# Patient Record
Sex: Female | Born: 1945 | Race: White | Hispanic: No | Smoking: Former smoker
Health system: Southern US, Community
[De-identification: ages and names within clinical notes are randomized; demographics above are authoritative.]

## PROBLEM LIST (undated history)

## (undated) DIAGNOSIS — E785 Hyperlipidemia, unspecified: Secondary | ICD-10-CM

## (undated) DIAGNOSIS — D649 Anemia, unspecified: Secondary | ICD-10-CM

## (undated) DIAGNOSIS — F17201 Nicotine dependence, unspecified, in remission: Secondary | ICD-10-CM

## (undated) DIAGNOSIS — E039 Hypothyroidism, unspecified: Secondary | ICD-10-CM

## (undated) DIAGNOSIS — I639 Cerebral infarction, unspecified: Secondary | ICD-10-CM

## (undated) HISTORY — PX: TOTAL ABDOMINAL HYSTERECTOMY: SHX209

---

## 2014-12-16 ENCOUNTER — Emergency Department (HOSPITAL_BASED_OUTPATIENT_CLINIC_OR_DEPARTMENT_OTHER)
Admission: EM | Admit: 2014-12-16 | Discharge: 2014-12-16 | Disposition: A | Discharge status: Home / Self Care | Payer: Medicare HMO | Attending: Emergency Medicine | Admitting: Emergency Medicine

## 2014-12-16 ENCOUNTER — Encounter (HOSPITAL_BASED_OUTPATIENT_CLINIC_OR_DEPARTMENT_OTHER): Payer: Self-pay | Admitting: *Deleted

## 2014-12-16 ENCOUNTER — Emergency Department (HOSPITAL_BASED_OUTPATIENT_CLINIC_OR_DEPARTMENT_OTHER): Payer: Medicare HMO

## 2014-12-16 DIAGNOSIS — Z880 Allergy status to penicillin: Secondary | ICD-10-CM | POA: Diagnosis not present

## 2014-12-16 DIAGNOSIS — Z87891 Personal history of nicotine dependence: Secondary | ICD-10-CM | POA: Insufficient documentation

## 2014-12-16 DIAGNOSIS — N200 Calculus of kidney: Secondary | ICD-10-CM

## 2014-12-16 DIAGNOSIS — R109 Unspecified abdominal pain: Secondary | ICD-10-CM

## 2014-12-16 DIAGNOSIS — R319 Hematuria, unspecified: Secondary | ICD-10-CM | POA: Diagnosis present

## 2014-12-16 LAB — URINE MICROSCOPIC-ADD ON

## 2014-12-16 LAB — CBC WITH DIFFERENTIAL/PLATELET
Basophils Absolute: 0 10*3/uL (ref 0.0–0.1)
Basophils Relative: 0 % (ref 0–1)
EOS ABS: 0 10*3/uL (ref 0.0–0.7)
Eosinophils Relative: 1 % (ref 0–5)
HEMATOCRIT: 39.7 % (ref 36.0–46.0)
HEMOGLOBIN: 13.1 g/dL (ref 12.0–15.0)
LYMPHS ABS: 0.8 10*3/uL (ref 0.7–4.0)
Lymphocytes Relative: 12 % (ref 12–46)
MCH: 32.2 pg (ref 26.0–34.0)
MCHC: 33 g/dL (ref 30.0–36.0)
MCV: 97.5 fL (ref 78.0–100.0)
MONOS PCT: 6 % (ref 3–12)
Monocytes Absolute: 0.5 10*3/uL (ref 0.1–1.0)
NEUTROS PCT: 81 % — AB (ref 43–77)
Neutro Abs: 5.8 10*3/uL (ref 1.7–7.7)
Platelets: 192 10*3/uL (ref 150–400)
RBC: 4.07 MIL/uL (ref 3.87–5.11)
RDW: 14.2 % (ref 11.5–15.5)
WBC: 7.2 10*3/uL (ref 4.0–10.5)

## 2014-12-16 LAB — URINALYSIS, ROUTINE W REFLEX MICROSCOPIC
BILIRUBIN URINE: NEGATIVE
Glucose, UA: NEGATIVE mg/dL
Ketones, ur: 15 mg/dL — AB
Leukocytes, UA: NEGATIVE
NITRITE: NEGATIVE
PH: 7.5 (ref 5.0–8.0)
Protein, ur: NEGATIVE mg/dL
SPECIFIC GRAVITY, URINE: 1.011 (ref 1.005–1.030)
Urobilinogen, UA: 0.2 mg/dL (ref 0.0–1.0)

## 2014-12-16 LAB — BASIC METABOLIC PANEL
Anion gap: 11 (ref 5–15)
BUN: 17 mg/dL (ref 6–20)
CHLORIDE: 98 mmol/L — AB (ref 101–111)
CO2: 27 mmol/L (ref 22–32)
CREATININE: 1.21 mg/dL — AB (ref 0.44–1.00)
Calcium: 10.8 mg/dL — ABNORMAL HIGH (ref 8.9–10.3)
GFR calc Af Amer: 52 mL/min — ABNORMAL LOW (ref >60)
GFR calc non Af Amer: 45 mL/min — ABNORMAL LOW (ref >60)
GLUCOSE: 108 mg/dL — AB (ref 65–99)
Potassium: 3.7 mmol/L (ref 3.5–5.1)
SODIUM: 136 mmol/L (ref 135–145)

## 2014-12-16 MED ORDER — TRAMADOL HCL 50 MG PO TABS: 50.0000 mg | ORAL_TABLET | Freq: Four times a day (QID) | ORAL | Status: DC | PRN

## 2014-12-16 NOTE — ED Notes (Signed)
Pt moaning and restless at triage, states she has been unable to void "just a thimblefull at a time" this am. Pt assisted to restroom in w/c for cc urine sample, voids approx 100cc dark orange urine, specimen obtained for testing, pt expresses immediate relief. Pt states "Oh my Delrae Rend, the pain is gone! That is the most I have peed since last night and now the pain is completely gone!" pt now rates her pain at 0/10.

## 2014-12-16 NOTE — ED Provider Notes (Signed)
CSN: [id number]     Arrival date & time 12/16/14  1233 History   First MD Initiated Contact with Patient 12/16/14 1307     Chief Complaint  Patient presents with  . Abdominal Pain     (Consider location/radiation/quality/duration/timing/severity/associated sxs/prior Treatment) HPI Comments: 69 y.o. Female presents for left sided flank/groin pain.  The patient reports that the pain came on suddenly around 10 AM this morning.  She said it was severe at time of onset and remained constant and severe until she urinated in the bathroom here where she suddenly felt complete relief of her pain.  The patient denies ever having pain like this before.  She reports that about 4 days ago she noted her urine looked cloudy and started drinking lots of water and taking cranberry to help with this.  She said that her urine cleared until this morning when it looked bloody.  No fevers or chills.  She currently has no pain.  She says that she felt nauseous when the pain was severe but did not throw up.  The pain felt like it radiate from her left side to her left groin.  She said that she could not stop moving secondary to the pain.     History reviewed. No pertinent past medical history. History reviewed. No pertinent past surgical history. History reviewed. No pertinent family history. Social History  Substance Use Topics  . Smoking status: Former Games developer  . Smokeless tobacco: None  . Alcohol Use: None   OB History    No data available     Review of Systems  Constitutional: Negative for fever, chills and fatigue.  HENT: Negative for congestion, postnasal drip and rhinorrhea.   Eyes: Negative for pain and redness.  Respiratory: Negative for cough, chest tightness and shortness of breath.   Cardiovascular: Negative for chest pain and palpitations.  Gastrointestinal: Positive for nausea and abdominal pain. Negative for vomiting, diarrhea and constipation.  Genitourinary: Positive for hematuria. Negative  for dysuria, urgency and frequency.  Musculoskeletal: Negative for myalgias and back pain.  Skin: Negative for rash.  Neurological: Negative for dizziness, weakness, light-headedness and headaches.  Hematological: Does not bruise/bleed easily.      Allergies  Ampicillin; Ciprofloxacin; Erythromycin; and Penicillins  Home Medications   Prior to Admission medications   Not on File   BP 178/101 mmHg  Pulse 74  Temp(Src) 97.5 F (36.4 C) (Oral)  Resp 18  Ht 5' (1.524 m)  Wt 158 lb (71.668 kg)  BMI 30.86 kg/m2  SpO2 98% Physical Exam  Constitutional: She is oriented to person, place, and time. She appears well-developed and well-nourished. No distress.  HENT:  Head: Normocephalic and atraumatic.  Right Ear: External ear normal.  Left Ear: External ear normal.  Mouth/Throat: Oropharynx is clear and moist. No oropharyngeal exudate.  Eyes: EOM are normal. Pupils are equal, round, and reactive to light.  Neck: Normal range of motion. Neck supple.  Cardiovascular: Normal rate, regular rhythm, normal heart sounds and intact distal pulses.   No murmur heard. Pulmonary/Chest: Effort normal. No respiratory distress. She has no wheezes. She has no rales.  Abdominal: Soft. She exhibits no distension. There is no tenderness.  Musculoskeletal: Normal range of motion. She exhibits no edema or tenderness.  Neurological: She is alert and oriented to person, place, and time.  Skin: Skin is warm and dry. No rash noted. She is not diaphoretic.  Vitals reviewed.   ED Course  Procedures (including critical care time) Labs Review Labs  Reviewed  URINALYSIS, ROUTINE W REFLEX MICROSCOPIC (NOT AT [facility])  CBC WITH DIFFERENTIAL/PLATELET  BASIC METABOLIC PANEL    Imaging Review No results found. I have personally reviewed and evaluated these images and lab results as part of my medical decision-making.   EKG Interpretation None      MDM  Patient was seen and evaluated in stable  condition.  Pain resolved before evaluation.  Patient with benign examination and was asymptomatic during evaluation.  UA with hematuria but without infection.  CT with small 2 mm calculus at UVJ.  Cr 1.2.  Patient feeling well.  Patient discharged home in stable condition with a prescription for ultram and instruction to follow up with her PCP. Patient and son in agreement with plan of care and all questions answered prior to discharge.  Final diagnoses:  Left flank pain    1. Renal colic, resolved  2. Ureteral calculus    Leta Baptist, MD 12/16/14 [phone]

## 2014-12-16 NOTE — ED Notes (Signed)
Pt to triage in w/c, reports "cloudy" urine on Saturday, pt states she started pushing fluids over the weekend, today had sudden onset of severe left lq pain radiating to left flank area, pt states "I've never had a kidney stone, but I think I may have one today!"

## 2014-12-16 NOTE — Discharge Instructions (Signed)
Ureteral Colic (Kidney Stones) °Ureteral colic is the result of a condition when kidney stones form inside the kidney. Once kidney stones are formed they may move into the tube that connects the kidney with the bladder (ureter). If this occurs, this condition may cause pain (colic) in the ureter.  °CAUSES  °Pain is caused by stone movement in the ureter and the obstruction caused by the stone. °SYMPTOMS  °The pain comes and goes as the ureter contracts around the stone. The pain is usually intense, sharp, and stabbing in character. The location of the pain may move as the stone moves through the ureter. When the stone is near the kidney the pain is usually located in the back and radiates to the belly (abdomen). When the stone is ready to pass into the bladder the pain is often located in the lower abdomen on the side the stone is located. At this location, the symptoms may mimic those of a urinary tract infection with urinary frequency. Once the stone is located here it often passes into the bladder and the pain disappears completely. °TREATMENT  °· Your caregiver will provide you with medicine for pain relief. °· You may require specialized follow-up X-rays. °· The absence of pain does not always mean that the stone has passed. It may have just stopped moving. If the urine remains completely obstructed, it can cause loss of kidney function or even complete destruction of the involved kidney. It is your responsibility and in your interest that X-rays and follow-ups as suggested by your caregiver are completed. Relief of pain without passage of the stone can be associated with severe damage to the kidney, including loss of kidney function on that side. °· If your stone does not pass on its own, additional measures may be taken by your caregiver to ensure its removal. °HOME CARE INSTRUCTIONS  °· Increase your fluid intake. Water is the preferred fluid since juices containing vitamin C may acidify the urine making it  less likely for certain stones (uric acid stones) to pass. °· Strain all urine. A strainer will be provided. Keep all particulate matter or stones for your caregiver to inspect. °· Take your pain medicine as directed. °· Make a follow-up appointment with your caregiver as directed. °· Remember that the goal is passage of your stone. The absence of pain does not mean the stone is gone. Follow your caregiver's instructions. °· Only take over-the-counter or prescription medicines for pain, discomfort, or fever as directed by your caregiver. °SEEK MEDICAL CARE IF:  °· Pain cannot be controlled with the prescribed medicine. °· You have a fever. °· Pain continues for longer than your caregiver advises it should. °· There is a change in the pain, and you develop chest discomfort or constant abdominal pain. °· You feel faint or pass out. °MAKE SURE YOU:  °· Understand these instructions. °· Will watch your condition. °· Will get help right away if you are not doing well or get worse. °Document Released: 12/29/2004 Document Revised: 07/16/2012 Document Reviewed: 09/15/2010 °[employer]® Patient Information ©2015 [employer]. This information is not intended to replace advice given to you by your health care provider. Make sure you discuss any questions you have with your health care provider. ° °

## 2020-12-05 ENCOUNTER — Inpatient Hospital Stay (HOSPITAL_COMMUNITY): Payer: Medicare HMO

## 2020-12-05 ENCOUNTER — Encounter (HOSPITAL_COMMUNITY): Payer: Self-pay | Admitting: Internal Medicine

## 2020-12-05 ENCOUNTER — Emergency Department (HOSPITAL_COMMUNITY): Payer: Medicare HMO

## 2020-12-05 ENCOUNTER — Other Ambulatory Visit: Payer: Self-pay

## 2020-12-05 ENCOUNTER — Inpatient Hospital Stay (HOSPITAL_COMMUNITY)
Admission: EM | Admit: 2020-12-05 | Discharge: 2020-12-12 | DRG: 643 | Disposition: A | Discharge status: Skilled Nursing Facility | Payer: Medicare HMO | Attending: Internal Medicine | Admitting: Internal Medicine

## 2020-12-05 DIAGNOSIS — N183 Chronic kidney disease, stage 3 unspecified: Secondary | ICD-10-CM | POA: Diagnosis present

## 2020-12-05 DIAGNOSIS — I160 Hypertensive urgency: Secondary | ICD-10-CM | POA: Diagnosis present

## 2020-12-05 DIAGNOSIS — R140 Abdominal distension (gaseous): Secondary | ICD-10-CM

## 2020-12-05 DIAGNOSIS — E079 Disorder of thyroid, unspecified: Secondary | ICD-10-CM

## 2020-12-05 DIAGNOSIS — I6330 Cerebral infarction due to thrombosis of unspecified cerebral artery: Secondary | ICD-10-CM | POA: Insufficient documentation

## 2020-12-05 DIAGNOSIS — R7401 Elevation of levels of liver transaminase levels: Secondary | ICD-10-CM | POA: Diagnosis present

## 2020-12-05 DIAGNOSIS — Z8673 Personal history of transient ischemic attack (TIA), and cerebral infarction without residual deficits: Secondary | ICD-10-CM

## 2020-12-05 DIAGNOSIS — Z880 Allergy status to penicillin: Secondary | ICD-10-CM

## 2020-12-05 DIAGNOSIS — Z20822 Contact with and (suspected) exposure to covid-19: Secondary | ICD-10-CM | POA: Diagnosis present

## 2020-12-05 DIAGNOSIS — Z881 Allergy status to other antibiotic agents status: Secondary | ICD-10-CM | POA: Diagnosis not present

## 2020-12-05 DIAGNOSIS — K567 Ileus, unspecified: Secondary | ICD-10-CM | POA: Diagnosis not present

## 2020-12-05 DIAGNOSIS — Z803 Family history of malignant neoplasm of breast: Secondary | ICD-10-CM

## 2020-12-05 DIAGNOSIS — R29714 NIHSS score 14: Secondary | ICD-10-CM | POA: Diagnosis present

## 2020-12-05 DIAGNOSIS — Z781 Physical restraint status: Secondary | ICD-10-CM

## 2020-12-05 DIAGNOSIS — D649 Anemia, unspecified: Secondary | ICD-10-CM | POA: Diagnosis not present

## 2020-12-05 DIAGNOSIS — Z90710 Acquired absence of both cervix and uterus: Secondary | ICD-10-CM

## 2020-12-05 DIAGNOSIS — G9340 Encephalopathy, unspecified: Secondary | ICD-10-CM

## 2020-12-05 DIAGNOSIS — Z823 Family history of stroke: Secondary | ICD-10-CM

## 2020-12-05 DIAGNOSIS — I361 Nonrheumatic tricuspid (valve) insufficiency: Secondary | ICD-10-CM | POA: Diagnosis not present

## 2020-12-05 DIAGNOSIS — D631 Anemia in chronic kidney disease: Secondary | ICD-10-CM | POA: Diagnosis present

## 2020-12-05 DIAGNOSIS — N179 Acute kidney failure, unspecified: Secondary | ICD-10-CM | POA: Diagnosis present

## 2020-12-05 DIAGNOSIS — G9341 Metabolic encephalopathy: Secondary | ICD-10-CM | POA: Diagnosis present

## 2020-12-05 DIAGNOSIS — E785 Hyperlipidemia, unspecified: Secondary | ICD-10-CM | POA: Diagnosis present

## 2020-12-05 DIAGNOSIS — E876 Hypokalemia: Secondary | ICD-10-CM | POA: Diagnosis not present

## 2020-12-05 DIAGNOSIS — R471 Dysarthria and anarthria: Secondary | ICD-10-CM | POA: Diagnosis present

## 2020-12-05 DIAGNOSIS — E039 Hypothyroidism, unspecified: Principal | ICD-10-CM | POA: Diagnosis present

## 2020-12-05 DIAGNOSIS — M170 Bilateral primary osteoarthritis of knee: Secondary | ICD-10-CM | POA: Diagnosis present

## 2020-12-05 DIAGNOSIS — E538 Deficiency of other specified B group vitamins: Secondary | ICD-10-CM | POA: Diagnosis present

## 2020-12-05 DIAGNOSIS — Z820 Family history of epilepsy and other diseases of the nervous system: Secondary | ICD-10-CM | POA: Diagnosis not present

## 2020-12-05 DIAGNOSIS — Z87891 Personal history of nicotine dependence: Secondary | ICD-10-CM | POA: Diagnosis not present

## 2020-12-05 DIAGNOSIS — E871 Hypo-osmolality and hyponatremia: Secondary | ICD-10-CM | POA: Diagnosis present

## 2020-12-05 DIAGNOSIS — I129 Hypertensive chronic kidney disease with stage 1 through stage 4 chronic kidney disease, or unspecified chronic kidney disease: Secondary | ICD-10-CM | POA: Diagnosis present

## 2020-12-05 DIAGNOSIS — I6389 Other cerebral infarction: Secondary | ICD-10-CM | POA: Diagnosis not present

## 2020-12-05 DIAGNOSIS — K5730 Diverticulosis of large intestine without perforation or abscess without bleeding: Secondary | ICD-10-CM | POA: Diagnosis present

## 2020-12-05 DIAGNOSIS — N1831 Chronic kidney disease, stage 3a: Secondary | ICD-10-CM

## 2020-12-05 DIAGNOSIS — I5189 Other ill-defined heart diseases: Secondary | ICD-10-CM | POA: Diagnosis not present

## 2020-12-05 DIAGNOSIS — I6340 Cerebral infarction due to embolism of unspecified cerebral artery: Secondary | ICD-10-CM | POA: Diagnosis present

## 2020-12-05 HISTORY — DX: Nicotine dependence, unspecified, in remission: F17.201

## 2020-12-05 HISTORY — DX: Anemia, unspecified: D64.9

## 2020-12-05 LAB — URINALYSIS, ROUTINE W REFLEX MICROSCOPIC
Bilirubin Urine: NEGATIVE
Glucose, UA: NEGATIVE mg/dL
Ketones, ur: NEGATIVE mg/dL
Leukocytes,Ua: NEGATIVE
Nitrite: NEGATIVE
Protein, ur: NEGATIVE mg/dL
Specific Gravity, Urine: 1.011 (ref 1.005–1.030)
pH: 7 (ref 5.0–8.0)

## 2020-12-05 LAB — DIFFERENTIAL
Abs Immature Granulocytes: 0.05 10*3/uL (ref 0.00–0.07)
Basophils Absolute: 0.1 10*3/uL (ref 0.0–0.1)
Basophils Relative: 1 %
Eosinophils Absolute: 0 10*3/uL (ref 0.0–0.5)
Eosinophils Relative: 0 %
Immature Granulocytes: 1 %
Lymphocytes Relative: 26 %
Lymphs Abs: 1.5 10*3/uL (ref 0.7–4.0)
Monocytes Absolute: 0.3 10*3/uL (ref 0.1–1.0)
Monocytes Relative: 6 %
Neutro Abs: 3.9 10*3/uL (ref 1.7–7.7)
Neutrophils Relative %: 66 %

## 2020-12-05 LAB — CBC
HCT: 32.6 % — ABNORMAL LOW (ref 36.0–46.0)
Hemoglobin: 10.6 g/dL — ABNORMAL LOW (ref 12.0–15.0)
MCH: 31.6 pg (ref 26.0–34.0)
MCHC: 32.5 g/dL (ref 30.0–36.0)
MCV: 97.3 fL (ref 80.0–100.0)
Platelets: 201 10*3/uL (ref 150–400)
RBC: 3.35 MIL/uL — ABNORMAL LOW (ref 3.87–5.11)
RDW: 13.8 % (ref 11.5–15.5)
WBC: 5.8 10*3/uL (ref 4.0–10.5)
nRBC: 0 % (ref 0.0–0.2)

## 2020-12-05 LAB — COMPREHENSIVE METABOLIC PANEL
ALT: 22 U/L (ref 0–44)
AST: 68 U/L — ABNORMAL HIGH (ref 15–41)
Albumin: 4 g/dL (ref 3.5–5.0)
Alkaline Phosphatase: 66 U/L (ref 38–126)
Anion gap: 12 (ref 5–15)
BUN: 14 mg/dL (ref 8–23)
CO2: 23 mmol/L (ref 22–32)
Calcium: 9.5 mg/dL (ref 8.9–10.3)
Chloride: 96 mmol/L — ABNORMAL LOW (ref 98–111)
Creatinine, Ser: 1.22 mg/dL — ABNORMAL HIGH (ref 0.44–1.00)
GFR, Estimated: 47 mL/min — ABNORMAL LOW (ref >60)
Glucose, Bld: 113 mg/dL — ABNORMAL HIGH (ref 70–99)
Potassium: 3.9 mmol/L (ref 3.5–5.1)
Sodium: 131 mmol/L — ABNORMAL LOW (ref 135–145)
Total Bilirubin: 0.4 mg/dL (ref 0.3–1.2)
Total Protein: 7.2 g/dL (ref 6.5–8.1)

## 2020-12-05 LAB — RAPID URINE DRUG SCREEN, HOSP PERFORMED
Amphetamines: NOT DETECTED
Barbiturates: NOT DETECTED
Benzodiazepines: NOT DETECTED
Cocaine: NOT DETECTED
Opiates: NOT DETECTED
Tetrahydrocannabinol: NOT DETECTED

## 2020-12-05 LAB — I-STAT CHEM 8, ED
BUN: 15 mg/dL (ref 8–23)
Calcium, Ion: 1.17 mmol/L (ref 1.15–1.40)
Chloride: 99 mmol/L (ref 98–111)
Creatinine, Ser: 1 mg/dL (ref 0.44–1.00)
Glucose, Bld: 109 mg/dL — ABNORMAL HIGH (ref 70–99)
HCT: 33 % — ABNORMAL LOW (ref 36.0–46.0)
Hemoglobin: 11.2 g/dL — ABNORMAL LOW (ref 12.0–15.0)
Potassium: 3.9 mmol/L (ref 3.5–5.1)
Sodium: 132 mmol/L — ABNORMAL LOW (ref 135–145)
TCO2: 24 mmol/L (ref 22–32)

## 2020-12-05 LAB — PROTIME-INR
INR: 1 (ref 0.8–1.2)
Prothrombin Time: 13.4 seconds (ref 11.4–15.2)

## 2020-12-05 LAB — CSF CELL COUNT WITH DIFFERENTIAL
RBC Count, CSF: 0 /mm3
Tube #: 1
WBC, CSF: 2 /mm3 (ref 0–5)

## 2020-12-05 LAB — LACTIC ACID, PLASMA: Lactic Acid, Venous: 1.3 mmol/L (ref 0.5–1.9)

## 2020-12-05 LAB — CBG MONITORING, ED: Glucose-Capillary: 110 mg/dL — ABNORMAL HIGH (ref 70–99)

## 2020-12-05 LAB — TSH: TSH: 41.462 u[IU]/mL — ABNORMAL HIGH (ref 0.350–4.500)

## 2020-12-05 LAB — APTT: aPTT: 30 seconds (ref 24–36)

## 2020-12-05 LAB — ETHANOL: Alcohol, Ethyl (B): <10 mg/dL (ref <10)

## 2020-12-05 LAB — AMMONIA: Ammonia: 13 umol/L (ref 9–35)

## 2020-12-05 LAB — C-REACTIVE PROTEIN: CRP: <0.5 mg/dL (ref <1.0)

## 2020-12-05 LAB — GAMMA GT: GGT: 10 U/L (ref 7–50)

## 2020-12-05 LAB — SALICYLATE LEVEL: Salicylate Lvl: <7 mg/dL — ABNORMAL LOW (ref 7.0–30.0)

## 2020-12-05 LAB — VITAMIN B12: Vitamin B-12: 206 pg/mL (ref 180–914)

## 2020-12-05 LAB — SEDIMENTATION RATE: Sed Rate: 38 mm/hr — ABNORMAL HIGH (ref 0–22)

## 2020-12-05 LAB — ACETAMINOPHEN LEVEL: Acetaminophen (Tylenol), Serum: <10 ug/mL — ABNORMAL LOW (ref 10–30)

## 2020-12-05 MED ORDER — SODIUM CHLORIDE 0.9% FLUSH
3.0000 mL | Freq: Once | INTRAVENOUS | Status: AC
  Administered 2020-12-05: 3 mL via INTRAVENOUS

## 2020-12-05 MED ORDER — KETAMINE HCL 50 MG/5ML IJ SOSY
60.0000 mg | PREFILLED_SYRINGE | Freq: Once | INTRAMUSCULAR | Status: AC
  Administered 2020-12-05: 50 mg via INTRAVENOUS
  Filled 2020-12-05: qty 10

## 2020-12-05 MED ORDER — THIAMINE HCL 100 MG/ML IJ SOLN
250.0000 mg | Freq: Every day | INTRAVENOUS | Status: AC
  Administered 2020-12-07 – 2020-12-12 (×6): 250 mg via INTRAVENOUS
  Filled 2020-12-05 (×6): qty 2.5

## 2020-12-05 MED ORDER — SODIUM CHLORIDE 0.9 % IV SOLN: INTRAVENOUS | Status: DC

## 2020-12-05 MED ORDER — LORAZEPAM 2 MG/ML IJ SOLN
INTRAMUSCULAR | Status: AC
  Administered 2020-12-05: 1 mg via INTRAVENOUS
  Filled 2020-12-05: qty 1

## 2020-12-05 MED ORDER — ONDANSETRON HCL 4 MG PO TABS: 4.0000 mg | ORAL_TABLET | Freq: Four times a day (QID) | ORAL | Status: DC | PRN

## 2020-12-05 MED ORDER — LIDOCAINE HCL (PF) 1 % IJ SOLN
INTRAMUSCULAR | Status: AC
  Filled 2020-12-05: qty 5

## 2020-12-05 MED ORDER — SODIUM CHLORIDE 0.9% FLUSH
3.0000 mL | Freq: Two times a day (BID) | INTRAVENOUS | Status: DC
  Administered 2020-12-05 – 2020-12-12 (×14): 3 mL via INTRAVENOUS

## 2020-12-05 MED ORDER — KETAMINE HCL 10 MG/ML IJ SOLN
INTRAMUSCULAR | Status: AC | PRN
  Administered 2020-12-05: 30 mg via INTRAVENOUS
  Administered 2020-12-05: 20 mg via INTRAVENOUS

## 2020-12-05 MED ORDER — HYDRALAZINE HCL 20 MG/ML IJ SOLN: 10.0000 mg | INTRAMUSCULAR | Status: DC | PRN

## 2020-12-05 MED ORDER — THIAMINE HCL 100 MG/ML IJ SOLN: 100.0000 mg | Freq: Every day | INTRAMUSCULAR | Status: DC

## 2020-12-05 MED ORDER — LORAZEPAM 2 MG/ML IJ SOLN: 1.0000 mg | Freq: Once | INTRAMUSCULAR | Status: AC

## 2020-12-05 MED ORDER — HALOPERIDOL LACTATE 5 MG/ML IJ SOLN
10.0000 mg | Freq: Once | INTRAMUSCULAR | Status: AC
  Administered 2020-12-05: 10 mg via INTRAMUSCULAR
  Filled 2020-12-05: qty 2

## 2020-12-05 MED ORDER — ONDANSETRON HCL 4 MG/2ML IJ SOLN: 4.0000 mg | Freq: Four times a day (QID) | INTRAMUSCULAR | Status: DC | PRN

## 2020-12-05 MED ORDER — ENOXAPARIN SODIUM 40 MG/0.4ML IJ SOSY
40.0000 mg | PREFILLED_SYRINGE | INTRAMUSCULAR | Status: DC
  Administered 2020-12-05 – 2020-12-06 (×2): 40 mg via SUBCUTANEOUS
  Filled 2020-12-05 (×2): qty 0.4

## 2020-12-05 MED ORDER — THIAMINE HCL 100 MG/ML IJ SOLN
500.0000 mg | Freq: Three times a day (TID) | INTRAVENOUS | Status: AC
  Administered 2020-12-05 – 2020-12-07 (×6): 500 mg via INTRAVENOUS
  Filled 2020-12-05 (×7): qty 5

## 2020-12-05 MED ORDER — DEXTROSE 5 % IV SOLN
10.0000 mg/kg | Freq: Two times a day (BID) | INTRAVENOUS | Status: DC
  Administered 2020-12-05 – 2020-12-06 (×2): 640 mg via INTRAVENOUS
  Filled 2020-12-05 (×5): qty 12.8

## 2020-12-05 NOTE — ED Triage Notes (Signed)
Pt her via EMS with stroke symptoms. Pt drove to [facility], went inside and returned to car. Staff found pt in car, altered, with slurred speech, lethagic. LKW 12/05/20 @1000 . Pt independent and lives alone.

## 2020-12-05 NOTE — ED Notes (Signed)
Patient is still agitated s/p Haldol administration, trying to grab lines.

## 2020-12-05 NOTE — ED Notes (Signed)
Patient's daughter at the bedside.

## 2020-12-05 NOTE — ED Provider Notes (Signed)
[facility] EMERGENCY DEPARTMENT Provider Note   CSN: [id number] Arrival date & time: 12/05/20  1245     History No chief complaint on file.   Jane Navarro is a 75 y.o. female.  Patient presented as a stroke alert.  She reportedly was able to go to a supermarket and do her shopping however when she was found outside in the car she appeared confused and altered and onlookers called police.  Upon their arrival she was very confused there were concerned about a stroke and brought to the ER.  Stroke alert was activated on route.  Unable to gain additional review of systems was history from the patient herself.  Family arrived and states that she really does not see doctors ever and there is no known medical conditions.      Past Medical History:  Diagnosis Date   Anemia    Tobacco abuse, in remission     Patient Active Problem List   Diagnosis Date Noted   Acute metabolic encephalopathy 12/05/2020   Hypertensive urgency 12/05/2020   CKD (chronic kidney disease), stage III (HCC) 12/05/2020   Normocytic anemia 12/05/2020   Transaminitis 12/05/2020     Past Surgical History:  Procedure Laterality Date   TOTAL ABDOMINAL HYSTERECTOMY       OB History   No obstetric history on file.     Family History  Problem Relation Age of Onset   Breast cancer Mother    Dementia Mother    Transient ischemic attack Mother    Parkinson's disease Father     Social History   Tobacco Use   Smoking status: Former   Tobacco comments:    Quit smoking in her late 28s  Substance Use Topics   Alcohol use: Not Currently   Drug use: Never    Home Medications Prior to Admission medications   Not on File    Allergies    Ampicillin, Ciprofloxacin, Erythromycin, and Penicillins  Review of Systems   Review of Systems  Unable to perform ROS: Mental status change   Physical Exam Updated Vital Signs BP (!) 171/92 (BP Location: Right Arm)   Pulse 74   Temp 98.2 F  (36.8 C)   Resp 17   Ht 5' (1.524 m)   Wt 64.2 kg   SpO2 99%   BMI 27.64 kg/m   Physical Exam Constitutional:      General: She is not in acute distress.    Appearance: Normal appearance.     Comments: Awake, confused  HENT:     Head: Normocephalic.     Nose: Nose normal.  Eyes:     Extraocular Movements: Extraocular movements intact.  Cardiovascular:     Rate and Rhythm: Normal rate.  Pulmonary:     Effort: Pulmonary effort is normal.  Musculoskeletal:        General: Normal range of motion.     Cervical back: Normal range of motion.  Neurological:     Comments: Patient appears moving all extremities no focal weakness noted.  She appears confused, following commands intermittently.  She will answer questions but with bizarre answers.  No facial droop or slurred speech noted.    ED Results / Procedures / Treatments   Labs (all labs ordered are listed, but only abnormal results are displayed) Labs Reviewed  CBC - Abnormal; Notable for the following components:      Result Value   RBC 3.35 (*)    Hemoglobin 10.6 (*)  HCT 32.6 (*)    All other components within normal limits  COMPREHENSIVE METABOLIC PANEL - Abnormal; Notable for the following components:   Sodium 131 (*)    Chloride 96 (*)    Glucose, Bld 113 (*)    Creatinine, Ser 1.22 (*)    AST 68 (*)    GFR, Estimated 47 (*)    All other components within normal limits  PROTEIN AND GLUCOSE, CSF - Abnormal; Notable for the following components:   Total  Protein, CSF 84 (*)    All other components within normal limits  URINALYSIS, ROUTINE W REFLEX MICROSCOPIC - Abnormal; Notable for the following components:   APPearance HAZY (*)    Hgb urine dipstick SMALL (*)    Bacteria, UA RARE (*)    All other components within normal limits  TSH - Abnormal; Notable for the following components:   TSH 41.462 (*)    All other components within normal limits  SEDIMENTATION RATE - Abnormal; Notable for the following  components:   Sed Rate 38 (*)    All other components within normal limits  SALICYLATE LEVEL - Abnormal; Notable for the following components:   Salicylate Lvl <7.0 (*)    All other components within normal limits  ACETAMINOPHEN LEVEL - Abnormal; Notable for the following components:   Acetaminophen (Tylenol), Serum <10 (*)    All other components within normal limits  COMPREHENSIVE METABOLIC PANEL - Abnormal; Notable for the following components:   Sodium 127 (*)    Chloride 96 (*)    Glucose, Bld 111 (*)    AST 90 (*)    All other components within normal limits  LIPID PANEL - Abnormal; Notable for the following components:   Cholesterol 429 (*)    Triglycerides 258 (*)    VLDL 52 (*)    LDL Cholesterol 327 (*)    All other components within normal limits  CBC WITH DIFFERENTIAL/PLATELET - Abnormal; Notable for the following components:   RBC 3.66 (*)    Hemoglobin 11.8 (*)    HCT 34.1 (*)    Lymphs Abs 0.5 (*)    All other components within normal limits  COMPREHENSIVE METABOLIC PANEL - Abnormal; Notable for the following components:   Sodium 128 (*)    Chloride 96 (*)    Creatinine, Ser 1.15 (*)    AST 109 (*)    GFR, Estimated 50 (*)    All other components within normal limits  CBC - Abnormal; Notable for the following components:   RBC 3.69 (*)    Hemoglobin 11.8 (*)    HCT 34.4 (*)    All other components within normal limits  I-STAT CHEM 8, ED - Abnormal; Notable for the following components:   Sodium 132 (*)    Glucose, Bld 109 (*)    Hemoglobin 11.2 (*)    HCT 33.0 (*)    All other components within normal limits  CBG MONITORING, ED - Abnormal; Notable for the following components:   Glucose-Capillary 110 (*)    All other components within normal limits  CSF CULTURE W GRAM STAIN  SARS CORONAVIRUS 2 (TAT 6-24 HRS)  CULTURE, BLOOD (ROUTINE X 2)  CULTURE, BLOOD (ROUTINE X 2)  PROTIME-INR  APTT  DIFFERENTIAL  CSF CELL COUNT WITH DIFFERENTIAL  CSF CELL  COUNT WITH DIFFERENTIAL  ETHANOL  RAPID URINE DRUG SCREEN, HOSP PERFORMED  LACTIC ACID, PLASMA  VITAMIN B12  AMMONIA  RPR  C-REACTIVE PROTEIN  GAMMA GT  MAGNESIUM  PHOSPHORUS  IRON AND TIBC  FOLATE  HEPARIN LEVEL (UNFRACTIONATED)  HSV 1/2 PCR, CSF  OLIGOCLONAL BANDS, CSF + SERM  DRAW EXTRA CLOT TUBE  VITAMIN B1  HEMOGLOBIN A1C  RAPID URINE DRUG SCREEN, HOSP PERFORMED  HEPARIN LEVEL (UNFRACTIONATED)    EKG EKG Interpretation  Date/Time:  Saturday December 05 2020 13:32:58 EDT Ventricular Rate:  84 PR Interval:  197 QRS Duration: 94 QT Interval:  407 QTC Calculation: 482 R Axis:   -41 Text Interpretation: Sinus rhythm Probable LVH with secondary repol abnrm Inferior infarct, old Anterior Q waves, possibly due to LVH Baseline wander in lead(s) I II aVR Confirmed by Meridee Score [phone]) on 12/06/2020 12:03:50 PM  Radiology ECHOCARDIOGRAM COMPLETE  Result Date: 12/06/2020    ECHOCARDIOGRAM REPORT   Patient Name:   YURITZA PAULHUS Date of Exam: 12/06/2020 Medical Rec #:  [id number]    Height:       60.0 in Accession #:    [id number]   Weight:       141.5 lb Date of Birth:  [birth year]    BSA:          1.612 m Patient Age:    74 years     BP:           150/92 mmHg Patient Gender: F            HR:           72 bpm. Exam Location:  Inpatient Procedure: 2D Echo, Cardiac Doppler and Color Doppler Indications:    Stroke I63.9  History:        Patient has no prior history of Echocardiogram examinations.                 Signs/Symptoms:Altered Mental Status; Risk Factors:Former                 Smoker. Hypertensive urgency. Hypothyroidism. Passed out in                 parking lot after shopping.  Sonographer:    Roosvelt Maser RDCS Referring Phys: [id number] [address] [facility] IMPRESSIONS  1. 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 It measures approximately 13 mm in maximum  length and 5 mm in width, with a relatively narrow attachment base. Left ventricular ejection fraction, by estimation, is 60 to 65%. The left ventricle has normal function. The left ventricle has no regional wall motion abnormalities. There is mild concentric left ventricular hypertrophy. Left ventricular diastolic parameters are consistent with Grade I diastolic dysfunction (impaired relaxation). Elevated left atrial pressure.  2. Right ventricular systolic function is normal. The right ventricular size is normal.  3. The mitral valve is normal in structure. No evidence of mitral valve regurgitation. No evidence of mitral stenosis.  4. The aortic valve is normal in structure. Aortic valve regurgitation is not visualized. No aortic stenosis is present.  5. The inferior vena cava is normal in size with greater than 50% respiratory variability, suggesting right atrial pressure of 3 mmHg. Conclusion(s)/Recommendation(s): Discussed with primary team. The left ventricular mass is highly atypical for either thrombus or vegetation and the location is unusual for myxoma or papillary fibroelastoma. Its mobility suggests significant embolic potential. While TEE would offer better resolution, it may not add diagnostic value. Consider cardiac MRI. FINDINGS  Left Ventricle: There is a highly mobile atypical hyperechogenic mass in the left ventricular  cavity, attached to the lateral wall endocardium, just apical to the papillary muscle base. It measures approximately 13 mm in maximum length and 5 mm in width, with a relatively narrow attachment base. Left ventricular ejection fraction, by estimation, is 60 to 65%. The left ventricle has normal function. The left ventricle has no regional wall motion abnormalities. The left ventricular internal cavity size was normal in size. There is mild concentric left ventricular hypertrophy. Left ventricular diastolic parameters are consistent with Grade I diastolic dysfunction (impaired  relaxation). Elevated left atrial pressure. Right Ventricle: The right ventricular size is normal. No increase in right ventricular wall thickness. Right ventricular systolic function is normal. Left Atrium: Left atrial size was normal in size. Right Atrium: Right atrial size was normal in size. Pericardium: There is no evidence of pericardial effusion. Mitral Valve: The mitral valve is normal in structure. No evidence of mitral valve regurgitation. No evidence of mitral valve stenosis. Tricuspid Valve: The tricuspid valve is normal in structure. Tricuspid valve regurgitation is not demonstrated. No evidence of tricuspid stenosis. Aortic Valve: The aortic valve is normal in structure. Aortic valve regurgitation is not visualized. No aortic stenosis is present. Aortic valve mean gradient measures 5.0 mmHg. Aortic valve peak gradient measures 8.1 mmHg. Aortic valve area, by VTI measures 1.71 cm. Pulmonic Valve: The pulmonic valve was normal in structure. Pulmonic valve regurgitation is not visualized. No evidence of pulmonic stenosis. Aorta: The aortic root is normal in size and structure. Venous: The inferior vena cava is normal in size with greater than 50% respiratory variability, suggesting right atrial pressure of 3 mmHg. IAS/Shunts: No atrial level shunt detected by color flow Doppler.  LEFT VENTRICLE PLAX 2D LVIDd:         3.60 cm LVIDs:         2.60 cm  Diastology LV PW:         1.20 cm  LV e' medial:    3.97 cm/s LV IVS:        1.50 cm  LV E/e' medial:  16.8 LVOT diam:     1.70 cm  LV e' lateral:   5.35 cm/s LV SV:         49       LV E/e' lateral: 12.5 LV SV Index:   30 LVOT Area:     2.27 cm  RIGHT VENTRICLE RV Basal diam:  3.00 cm LEFT ATRIUM             Index       RIGHT ATRIUM           Index LA diam:        2.10 cm 1.30 cm/m  RA Area:     10.60 cm LA Vol (A2C):   36.7 ml 22.77 ml/m RA Volume:   21.10 ml  13.09 ml/m LA Vol (A4C):   21.6 ml 13.40 ml/m LA Biplane Vol: 30.1 ml 18.68 ml/m  AORTIC  VALVE AV Area (Vmax):    1.65 cm AV Area (Vmean):   1.48 cm AV Area (VTI):     1.71 cm AV Vmax:           142.00 cm/s AV Vmean:          105.000 cm/s AV VTI:            0.285 m AV Peak Grad:      8.1 mmHg AV Mean Grad:      5.0 mmHg LVOT Vmax:  103.00 cm/s LVOT Vmean:        68.300 cm/s LVOT VTI:          0.215 m LVOT/AV VTI ratio: 0.75  AORTA Ao Root diam: 2.40 cm MITRAL VALVE MV Area (PHT): 2.29 cm     SHUNTS MV Decel Time: 332 msec     Systemic VTI:  0.22 m MV E velocity: 66.80 cm/s   Systemic Diam: 1.70 cm MV A velocity: 109.00 cm/s MV E/A ratio:  0.61 Mihai Croitoru MD Electronically signed by Thurmon Fair MD Signature Date/Time: 12/06/2020/2:42:42 PM    Final    US THYROID  Result Date: 12/06/2020 CLINICAL DATA:  Goiter.  Elevated TSH EXAM: THYROID ULTRASOUND TECHNIQUE: Ultrasound examination of the thyroid gland and adjacent soft tissues was performed. COMPARISON:  None. FINDINGS: Parenchymal Echotexture: None identified Isthmus: Not visualized Right lobe: Not visualized Left lobe: Not visualized _________________________________________________________ Estimated total number of nodules >/= 1 cm: 0 Number of spongiform nodules >/=  2 cm not described below (TR1): 0 Number of mixed cystic and solid nodules >/= 1.5 cm not described below (TR2): 0 _________________________________________________________ No normal thyroid parenchyma was localized. No regional cervical adenopathy. IMPRESSION: Nonvisualization of thyroid. In the absence of previous thyroidectomy, this would suggest marked parenchymal atrophy. The above is in keeping with the ACR TI-RADS recommendations - J Am Coll Radiol 2017;14:587-595. Electronically Signed   By: Corlis Leak M.D.   On: 12/06/2020 15:53   CT HEAD CODE STROKE WO CONTRAST  Result Date: 12/05/2020 CLINICAL DATA:  Code stroke. Neuro deficit, acute, stroke suspected. EXAM: CT HEAD WITHOUT CONTRAST TECHNIQUE: Contiguous axial images were obtained from the base of the  skull through the vertex without intravenous contrast. COMPARISON:  Report from head CT 11/21/1998 (images currently unavailable). FINDINGS: Brain: Cerebral volume is normal for age. 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 Background mild multifocal T2/FLAIR hyperintensity within the cerebral white matter, nonspecific but compatible with chronic small vessel ischemic disease. There is no acute intracranial hemorrhage. No demarcated cortical infarct. No extra-axial fluid collection. No evidence of an intracranial mass. No midline shift. Vascular: No hyperdense vessel.  Atherosclerotic calcifications. Skull: Normal. Negative for fracture or focal lesion. Sinuses/Orbits: Visualized orbits show no acute finding. No significant paranasal sinus disease. ASPECTS [facility] Stroke Program Early CT Score) - Ganglionic level infarction (caudate, lentiform nuclei, internal capsule, insula, M1-M3 cortex): 7 - Supraganglionic infarction (M4-M6 cortex): 3 Total score (0-10 with 10 being normal): 10 (when discounting chronic infarcts). These results were communicated to Dr. Selina Cooley At 1:18 pmon 9/3/2022by text page via the [facility] messaging system. IMPRESSION: No evidence of acute intracranial abnormality. 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 Background mild chronic small vessel ischemic changes within the cerebral white matter. Electronically Signed   By: Jackey Loge D.O.   On: 12/05/2020 13:18    Procedures .Lumbar Puncture  Date/Time: 12/05/2020 3:31 PM Performed by: Cheryll Cockayne, MD Authorized by: Cheryll Cockayne, MD   Consent:    Consent obtained:  Emergent situation and verbal   Consent given by:  Healthcare agent Procedure details:    Lumbar space:  L3-L4 interspace   Needle gauge:  20 .Critical Care  Date/Time: 12/05/2020 3:31 PM Performed by: Cheryll Cockayne, MD Authorized by: Cheryll Cockayne, MD   Critical care provider statement:     Critical care time (minutes):  40   Critical care time was exclusive of:  Separately billable procedures and treating  other patients and teaching time   Critical care was necessary to treat or prevent imminent or life-threatening deterioration of the following conditions:  CNS failure or compromise   Medications Ordered in ED Medications  thiamine 500mg  in normal saline (35ml) IVPB (500 mg Intravenous New Bag/Given 12/07/20 0526)    Followed by  thiamine (B-1) 250 mg in sodium chloride 0.9 % 50 mL IVPB (has no administration in time range)    Followed by  thiamine (B-1) injection 100 mg (has no administration in time range)  sodium chloride flush (NS) 0.9 % injection 3 mL (3 mLs Intravenous Given 12/06/20 2110)  ondansetron (ZOFRAN) tablet 4 mg (has no administration in time range)    Or  ondansetron (ZOFRAN) injection 4 mg (has no administration in time range)  hydrALAZINE (APRESOLINE) injection 10 mg (has no administration in time range)  levothyroxine (SYNTHROID, LEVOTHROID) injection 50 mcg (50 mcg Intravenous Given 12/06/20 0916)  cyanocobalamin ((VITAMIN B-12)) injection 1,000 mcg (1,000 mcg Intramuscular Given 12/06/20 0916)  acyclovir (ZOVIRAX) 640 mg in dextrose 5 % 100 mL IVPB (640 mg Intravenous New Bag/Given 12/07/20 0409)  atorvastatin (LIPITOR) tablet 80 mg (80 mg Oral Given 12/06/20 1246)  heparin ADULT infusion 100 units/mL (25000 units/263mL) (650 Units/hr Intravenous Rate/Dose Change 12/07/20 0409)  LORazepam (ATIVAN) injection 0.5 mg (0.5 mg Intravenous Given 12/06/20 2117)  haloperidol lactate (HALDOL) injection 5 mg (5 mg Intravenous Given 12/06/20 1906)  sodium chloride flush (NS) 0.9 % injection 3 mL (3 mLs Intravenous Given 12/05/20 1330)  LORazepam (ATIVAN) injection 1 mg (1 mg Intravenous Given by Other 12/05/20 1328)  LORazepam (ATIVAN) injection 1 mg (1 mg Intravenous Given by Other 12/05/20 1329)  haloperidol lactate (HALDOL) injection 10 mg (10 mg Intramuscular Given 12/05/20 1442)   ketamine 50 mg in normal saline 5 mL (10 mg/mL) syringe (50 mg Intravenous Given 12/05/20 1543)  ketamine (KETALAR) injection (20 mg Intravenous Given 12/05/20 1543)  lidocaine (PF) (XYLOCAINE) 1 % injection (  Given by Other 12/05/20 1614)    ED Course  I have reviewed the triage vital signs and the nursing notes.  Pertinent labs & imaging results that were available during my care of the patient were reviewed by me and considered in my medical decision making (see chart for details).    MDM Rules/Calculators/A&P                           Patient was a stroke alert activation.  However no additional imaging was pursued as last known well is unknown and exam was nonfocal.  Labs and CT imaging unremarkable.  Patient seen by neurology recommending lumbar puncture.  Patient given additional Haldol without significant sedation achieved.  Ketamine sedation pursued for lumbar puncture attempt.  Will be admitted to the hospitalist team.  Final Clinical Impression(s) / ED Diagnoses Final diagnoses:  Acute encephalopathy    Rx / DC Orders ED Discharge Orders     None        02/04/21, MD 12/07/20 [phone]

## 2020-12-05 NOTE — ED Notes (Signed)
EMS brought pt dentures inside of glove. Placed on bedside table.

## 2020-12-05 NOTE — ED Notes (Addendum)
Son at the bedside signed informed consent, verbalized understanding

## 2020-12-05 NOTE — Progress Notes (Signed)
Pt received in bed combative. New IV placed, Pt Covid swabbed, restraints, Mitts and purewick applied. Family bedside and oriented to 3W processes. All questions and concerns addressed. Pt calm and resting now.

## 2020-12-05 NOTE — ED Notes (Signed)
Pt pulled left IV as she's leaving the room with SWAT nurse.

## 2020-12-05 NOTE — H&P (Signed)
History and Physical    Jane Navarro INO:[id number] DOB: [birth year] DOA: 12/05/2020  Referring MD/NP/PA:  Thamas Jaegers, ED PCP: Reita Cliche, MD  Patient coming from: Via EMS  Chief Complaint: Altered mental status  I have personally briefly reviewed patient's old medical records in [place]   HPI: Jane Navarro is a 75 y.o. female with medical history significant of anemia and remote history of tobacco abuse presents after being noted to be acutely altered after driving to the [employer] this morning.  Patient is unable to provide any history at this time as she is acutely altered.  Patient daughter is present at bedside and helps provide additional history.  The patient lives alone and is normally able to complete all of her ADLs and utilizes a cane or rolling walker to get around.  Supposedly the patient hates doctors and is likely not taking any medications except for possibly a multivitamin.  She may get mammograms every now and then because her mother had a history of breast cancer.  Her son last talked her approximately 6 days ago over the phone and she seemed to be in her normal state of health.  The patient is retired and has a dog that she cares for.  Reportedly the patient had been shopping for groceries, paid for them, and then walked out to her car.  Store Freight forwarder was notified by another shopper that she appeared to be sleeping in her car and called 911.  EMS found the patient to be confused on arrival reaching, panting, and not following commands.  Daughter notes that the patient had only previously drank socially to her knowledge and had quit smoking over 15 years ago.  In route with EMS patient was noted to have slurred speech, left-sided weakness, and confusion for which normal  ED Course: Upon admission into the emergency department patient presented as a code stroke.  CT scan of the brain showed no evidence of any acute abnormality chronic appearing infarct within the right  caudate nucleus.  She was not a candidate for tPA.  Neurology recommended work-up for encephalopathy.  Labs noted CBC 5.8, hemoglobin 10.6, 131, chloride 96, and 14, creatinine 1.22, AST 68, ALT 22.  Patient underwent LP with studies sent.  Patient had been given 2 mg IV and CT to obtain imaging and Haldol as she had continued to be combative  Review of Systems  Unable to perform ROS: Mental status change   Past Medical History:  Diagnosis Date   Anemia    Tobacco abuse, in remission     Past Surgical History:  Procedure Laterality Date   TOTAL ABDOMINAL HYSTERECTOMY       reports that she has quit smoking. She does not have any smokeless tobacco history on file. No history on file for alcohol use and drug use.  Allergies  Allergen Reactions   Ampicillin    Ciprofloxacin    Erythromycin    Penicillins     Family History  Problem Relation Age of Onset   Breast cancer Mother    Dementia Mother    Parkinson's disease Father     Prior to Admission medications   Not on File    Physical Exam:  Constitutional: Elderly female who appears to be cardiac and unable to follow commands at this time Vitals:   12/05/20 1200 12/05/20 1330 12/05/20 1415  BP:  (!) 158/135 (!) 153/92  Pulse:  (!) 104 70  Resp:  18 14  Temp:  98  F (36.7 C)   TempSrc:  Oral   SpO2:  100% 93%  Weight: 64.2 kg    Height:   5' (1.524 m)   Eyes: PERRL, lids and conjunctivae normal ENMT: Mucous membranes are dry. Posterior pharynx clear of any exudate or lesions.  Neck: Some neck rigidity appreciated when trying Respiratory: clear to auscultation bilaterally, no wheezing, no crackles. Normal respiratory effort. No accessory muscle use.  Cardiovascular: Tachycardia, no murmurs / rubs / gallops. No extremity edema. 2+ pedal pulses. No carotid bruits.  Abdomen: no tenderness, no masses palpated. No hepatosplenomegaly. Bowel sounds positive.  Musculoskeletal: no clubbing / cyanosis.  Mitts present on  hands. Skin: healed sore at the right inner ankle. Neurologic: Appears able to move all extremities, but not following commands to test for strength Psychiatric: Unable to assess orientation    Labs on Admission: I have personally reviewed following labs and imaging studies  CBC: Recent Labs  Lab 12/05/20 1247 12/05/20 1252  WBC 5.8  --   NEUTROABS 3.9  --   HGB 10.6* 11.2*  HCT 32.6* 33.0*  MCV 97.3  --   PLT 201  --    Basic Metabolic Panel: Recent Labs  Lab 12/05/20 1247 12/05/20 1252  NA 131* 132*  K 3.9 3.9  CL 96* 99  CO2 23  --   GLUCOSE 113* 109*  BUN 14 15  CREATININE 1.22* 1.00  CALCIUM 9.5  --    GFR: Estimated Creatinine Clearance: 41.3 mL/min (by C-G formula based on SCr of 1 mg/dL). Liver Function Tests: Recent Labs  Lab 12/05/20 1247  AST 68*  ALT 22  ALKPHOS 66  BILITOT 0.4  PROT 7.2  ALBUMIN 4.0   No results for input(s): LIPASE, AMYLASE in the last 168 hours. No results for input(s): AMMONIA in the last 168 hours. Coagulation Profile: Recent Labs  Lab 12/05/20 1247  INR 1.0   Cardiac Enzymes: No results for input(s): CKTOTAL, CKMB, CKMBINDEX, TROPONINI in the last 168 hours. BNP (last 3 results) No results for input(s): PROBNP in the last 8760 hours. HbA1C: No results for input(s): HGBA1C in the last 72 hours. CBG: Recent Labs  Lab 12/05/20 1248  GLUCAP 110*   Lipid Profile: No results for input(s): CHOL, HDL, LDLCALC, TRIG, CHOLHDL, LDLDIRECT in the last 72 hours. Thyroid Function Tests: No results for input(s): TSH, T4TOTAL, FREET4, T3FREE, THYROIDAB in the last 72 hours. Anemia Panel: No results for input(s): VITAMINB12, FOLATE, FERRITIN, TIBC, IRON, RETICCTPCT in the last 72 hours. Urine analysis:    Component Value Date/Time   COLORURINE YELLOW 12/16/2014 [place] 12/16/2014 1255   LABSPEC 1.011 12/16/2014 1255   PHURINE 7.5 12/16/2014 1255   GLUCOSEU NEGATIVE 12/16/2014 1255   HGBUR LARGE (A)  12/16/2014 1255   BILIRUBINUR NEGATIVE 12/16/2014 1255   KETONESUR 15 (A) 12/16/2014 1255   PROTEINUR NEGATIVE 12/16/2014 1255   UROBILINOGEN 0.2 12/16/2014 1255   NITRITE NEGATIVE 12/16/2014 1255   LEUKOCYTESUR NEGATIVE 12/16/2014 1255   Sepsis Labs: No results found for this or any previous visit (from the past 240 hour(s)).   Radiological Exams on Admission: CT HEAD CODE STROKE WO CONTRAST  Result Date: 12/05/2020 CLINICAL DATA:  Code stroke. Neuro deficit, acute, stroke suspected. EXAM: CT HEAD WITHOUT CONTRAST TECHNIQUE: Contiguous axial images were obtained from the base of the skull through the vertex without intravenous contrast. COMPARISON:  Report from head CT 11/21/1998 (images currently unavailable). FINDINGS: Brain: Cerebral volume is normal for age. 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 Background mild multifocal T2/FLAIR hyperintensity within the cerebral white matter, nonspecific but compatible with chronic small vessel ischemic disease. There is no acute intracranial hemorrhage. No demarcated cortical infarct. No extra-axial fluid collection. No evidence of an intracranial mass. No midline shift. Vascular: No hyperdense vessel.  Atherosclerotic calcifications. Skull: Normal. Negative for fracture or focal lesion. Sinuses/Orbits: Visualized orbits show no acute finding. No significant paranasal sinus disease. ASPECTS [facility] Stroke Program Early CT Score) - Ganglionic level infarction (caudate, lentiform nuclei, internal capsule, insula, M1-M3 cortex): 7 - Supraganglionic infarction (M4-M6 cortex): 3 Total score (0-10 with 10 being normal): 10 (when discounting chronic infarcts). These results were communicated to Dr. Quinn Axe At 1:18 pmon 9/3/2022by text page via the [facility] messaging system. IMPRESSION: No evidence of acute intracranial abnormality. 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 Background mild chronic  small vessel ischemic changes within the cerebral white matter. Electronically Signed   By: Kellie Simmering D.O.   On: 12/05/2020 13:18    EKG: Independently reviewed.  Sinus rhythm 84 bpm with anterior Q waves  Assessment/Plan Acute metabolic encephalopathy: Patient presents after being found sleeping in her car at the grocery store noted to be confused and combative.  At baseline patient lived alone and was able to complete ADLs without need of assistance.  CT scan of the brain did not note any acute abnormalities and patient was not a tPA candidate for -Admit to a medical telemetry bed -Neurochecks -N.p.o. until able to pass bedside swallow eval -Follow-up lumbar puncture and neurology started the patient empirically on acyclovir -Check UDS, ESR, CRP, TSH, hemoglobin A1c, lipid panel, ammonia level,  and RPR -Check acetaminophen, alcohol, and salicylate level -Check urinalysis -Checking EEG and echocardiogram percardiology -PT/OT/speech to eval and trea -Check MRI of the brain once able -Appreciate neurology consultative services,  will follow-up any other recommendations given  Hypertensive urgency: Acute.  Blood pressures initially elevated up to 181/118.  Neurology recommending permissive hypertension at this time. -Hydralazine IV as needed for systolic blood pressure greater than 280 or diastolic blood pressure greater than 110  Normocytic anemia: Acute.  Hemoglobin 10.6 g/dL on admission, and daughter reported prior history of anemia.  Last available available records from 2016 noted hemoglobin to be 13.1 g/dL. -Recheck CBC tomorrow morning  Chronic kidney disease stage IIIa: Creatinine 1.22 which appears similar to previous in 2016. -Continue to monitor  Transaminitis: Acute.  AST 68 and ALT 22.  Elevated AST to ALT ratio would give concern for possible alcohol abuse, but daughter reports that patient previously only drinks socially. -Check acute hepatitis panel, GGT  DVT  prophylaxis: Lovenox Code Status: Full  Family Communication:  daughter updated Disposition Plan: To be determined Consults called: Neurology Admission status: Inpatient, require more than 2 night stay as a clear cause of patient's symptoms is not known and she is still acutely altered.  Norval Morton MD [facility]   If 7PM-7AM, please contact night-coverage   12/05/2020, 3:17 PM

## 2020-12-05 NOTE — Progress Notes (Signed)
Standby for LP, PT needed assistance with resp  with a jaw thrust. Pt currently wake with no noted respiratory issues.

## 2020-12-05 NOTE — Sedation Documentation (Signed)
LP underway at bedside-RT at bedside managing airway and primary RN assisting with procedure

## 2020-12-05 NOTE — Consult Note (Signed)
Neurology Attending Attestation   I examined the patient and discussed plan with Ms. Jane Newcomer NP. Below note has been edited by me to reflect my findings and recommendations with the following addition:  LP results not c/f bacterial meningitis (WBC count 2 and 4 in two tubes). Continue acyclovir until HSV returns negative.   I was present throughout the stroke code and made all significant decisions and personally reviewed CNS imaging and also discussed CTA results with radiologist by phone.     Bing Neighbors, MD [facility] [phone]   If 7pm- 7am, please page neurology on call as listed in AMION.   Neurology consult   CC: code stroke.  History is obtained from: EMS, soon.  HPI:  [name] is a 75 yo female with PMHx of kidney stone and rest unknown because she has not been to a provider in years. No history found in care everywhere either. Patient presents by EMS today from a [employer] parking lot. This am, she was grocery shopping at store, retrieved her groceries, paid for them, and took them out to her car. Someone reported a person sleeping in their car and store manager went to check and found patient with slurred speech, left sided weakness, and confusion. 911 was called. CBG 119 and BP 100/70 en route. Patient on no blood thinners per EMS and son.     After brief exam on the ED bridge and for airway clearance, patient was taken emergently to CT suite. CTH showed no evidence of acute intracranial abnormality, but chronic appearing lacunar infarct within the right caudate nucleus and chronic small vessel ischemic changes. No other imaging was obtained. TNK was not considered due to unknown LKW. LVO not suspected.   NP spoke with son on the phone. Son says patient does not have any real medical problems that are known, because she does not go doctors. She has no PCP or specialists. Son states she has not had a stroke and does not take any prescribed  medications. Remote history of tobacco abuse and ETOH use, but not alcoholism. No history of dementia or cognitive decline recently. Patient lives alone, still drives, is independent of all ADLs and handles her own finances. She does use a walker or a cane sometimes because she has knee pain from arthritis. Son states they have pushed her to move in with family or go to ALF, but she refuses. Son pushes for doctor visits, but she also refuses.     LKW: unknown, sometime yesterday.  TNK given?: No, no focal deficit and outside window.  IR Thrombectomy?: No, LVO not suspected.  MRS: 1  NIHSS:  1a Level of Consciousness: 0 1b LOC Questions: 2 1c LOC Commands: 2 2 Best Gaze: 0 3 Visual: 0 4 Facial Palsy: 0 5a Motor Arm - left: 2 5b Motor Arm - Right: 2 6a Motor Leg - Left: 2 6b Motor Leg - Right: 2 7 Limb Ataxia: 0 8 Sensory: 0 9 Best Language: 0 10 Dysarthria: 2 11 Extinction and Inattention:  TOTAL:  14  ROS: A robust ROS was unable to be performed due to emergent nature of event.   No PMHx on file. None per son.    No FMHx of stroke per son.   Social History:  reports that she has quit smoking. She does not have any smokeless tobacco history on file. No history on file for alcohol use and drug use. Remote ETOH use. No illicit drugs.    Prior to  Admission medications   Medication Sig Start Date End Date Taking? Authorizing Provider  traMADol (ULTRAM) 50 MG tablet Take 1 tablet (50 mg total) by mouth every 6 (six) hours as needed for moderate pain or severe pain. 12/16/14   Leta Baptist, MD    Exam: Current vital signs: BP (!) 158/135 (BP Location: Right Arm)   Pulse (!) 104   Temp 98 F (36.7 C) (Oral)   Resp 18   Wt 64.2 kg   SpO2 100%   BMI 27.64 kg/m   Physical Exam  Constitutional: Appears well-developed and well-nourished. Confused. Combative.  Psych: Affect appropriate to situation. Eyes: No scleral injection. HENT: No OP obstruction. Head:  Normocephalic.  Cardiovascular: Normal rate and regular rhythm.  Respiratory: Effort normal.  GI: Abdomen soft.  + hernia. There is no tenderness.  Skin: scratches and petechiae noted to UEs.   Neuro: Mental Status: Patient is awake, alert and is oriented to her name, her son's name. Does not know where she is or why. Thinks the year is 1947 and just mumbles for month, day, and date. Patient is combative and not following commands. Had to given Ativan 2mg  IV in CT to obtain imaging.  Patient is unable to give a clear and coherent history. No signs of neglect. Speech/Language:  Speech is garbled, but intelligible most of the time. She is not fluent in speech. No aphasia, but + dysarthria. Unable to name or repeat. Bradyphrenic.   Cranial Nerves: II: Pupils are 60mm equal, round, and reactive to light. 4m.  III,IV, VI: EOMI without ptosis or diploplia. V: Facial sensation is symmetric to light touch in V1, V2, and V3. VII: Facial movement symmetrical. No droop noted.  VIII: hearing is intact to voice. X, XI, XII: Patient will not open mouth on command. Head is grossly midline.   Motor: Patient does not follow commands for strength testing, but is strong in all extremities when combative.  Sensory: Sensation is symmetric to touch in all fours extremities.  Plantars: Toes are downgoing bilaterally.  Cerebellar: No tremors, twitching, jerking, or clonus noted.   I have reviewed labs in epic and the pertinent results are: INR   1      aPTT   30          creatinine   1.0         Na 132      Glucose 109    WBCC [id number]    Hgb 11.2   AST to ALT ratio is over 3  MD reviewed the images obtained:  NCT head  CTH showed no evidence of acute intracranial abnormality, but chronic appearing lacunar infarct within the right caudate nucleus and chronic small vessel ischemic changes.   Assessment: 75 yo female found sleeping in her car by staff at 66 exhibiting stroke symptoms. CTH  negative. Given her altered mental status, meningitis is on the differential. Remote history of ETOH, so will check level and UDS.   Impression:  -confusion/encephalopathy of unknown origin.  -remote ETOH use with AST to ALT ratio high.   Plan: - Medicine admit.  -LP by ED MD. Orders for HSV, cell count, protein, glucose, oligoclonal bands, culture and sensitivity.  -Start empiric treatment for meningitis pending lab results/HSV.  - MRI brain with and without contrast when able.  - Recommend TTE. - Recommend labs: HbA1c, lipid panel, TSH. - Recommend Statin if LDL > 70 - SBP goal - Permissive hypertension first 24 h < 220/110.  Hold home medications for now. - Telemetry monitoring for arrhythmia. -NPO until fully awake.  - bedside Swallow screen. - Stroke education. - PT/OT/SLP consult. - NIHSS as per protocol. - frequent neuro checks.  - rEEG to eval for any epileptogenic discharges. - Recommend metabolic/infectious workup with UA with UCx, CXR, CK, serum lactate. -check Vit B12 and B1.  -High dose Thiamine.  -supplement B12 if level < 500.  -may want to work up AST to ALT ratio as any thing over 1 may be indicative of liver damage.    Patient seen by Jane Norman, MSN, APN-BC, nurse practitioner and by MD. Note/plan to be edited by MD as needed.  Pager: [phone]

## 2020-12-05 NOTE — Progress Notes (Signed)
Patient was shopping for groceries this morning at [employer], paid for her groceries, and walked out to her car. Later, the manager noticed that she looked like she was sleeping. 911 was called because of that. Patient brought to [facility] by [facility]. She was very confused on arrival, reaching, pinching, and not following commands. NIHSS 14. Patient not give TNK due to no stroke seen on imaging. LP ordered for patient

## 2020-12-05 NOTE — Progress Notes (Signed)
Pharmacy Antiviral Note  Jane Navarro is a 75 y.o. female admitted on 12/05/2020 with  suspected meningitis .  Pharmacy has been consulted for acyclovir dosing.  Plan: Acyclovir 10mg /kg (640mg ) IV q12h  - dose adjusted for renal function -Monitor renal function, clinical status, and treatment plan  Height: 5' (152.4 cm) Weight: 64.2 kg (141 lb 8.6 oz) IBW/kg (Calculated) : 45.5  Temp (24hrs), Avg:98 F (36.7 C), Min:98 F (36.7 C), Max:98 F (36.7 C)  Recent Labs  Lab 12/05/20 1247 12/05/20 1252  WBC 5.8  --   CREATININE 1.22* 1.00    Estimated Creatinine Clearance: 41.3 mL/min (by C-G formula based on SCr of 1 mg/dL).    Allergies  Allergen Reactions   Ampicillin    Ciprofloxacin    Erythromycin    Penicillins    Antimicrobials this admission: Acyclovir 9/3 >>   Microbiology results: 9/3 CSF culture w/ gram stain >>  Thank you for allowing pharmacy to be a part of this patient's care.  11/3, PharmD, [facility] Emergency Medicine Clinical Pharmacist ED RPh Phone: [phone] Main RX: [phone]

## 2020-12-05 NOTE — ED Notes (Signed)
Bilateral mittens placed d/t agitation, trying to pull lines.

## 2020-12-05 NOTE — ED Notes (Signed)
Received pt, agitated, moving arms and turning to sides. Unable to be redirected, confused.

## 2020-12-06 ENCOUNTER — Inpatient Hospital Stay (HOSPITAL_COMMUNITY): Payer: Medicare HMO

## 2020-12-06 ENCOUNTER — Encounter (HOSPITAL_COMMUNITY): Payer: Self-pay | Admitting: Internal Medicine

## 2020-12-06 DIAGNOSIS — E079 Disorder of thyroid, unspecified: Secondary | ICD-10-CM | POA: Diagnosis not present

## 2020-12-06 DIAGNOSIS — I6389 Other cerebral infarction: Secondary | ICD-10-CM

## 2020-12-06 DIAGNOSIS — G9341 Metabolic encephalopathy: Secondary | ICD-10-CM | POA: Diagnosis not present

## 2020-12-06 DIAGNOSIS — N1831 Chronic kidney disease, stage 3a: Secondary | ICD-10-CM | POA: Diagnosis not present

## 2020-12-06 DIAGNOSIS — D649 Anemia, unspecified: Secondary | ICD-10-CM | POA: Diagnosis not present

## 2020-12-06 LAB — CBC WITH DIFFERENTIAL/PLATELET
Abs Immature Granulocytes: 0.02 10*3/uL (ref 0.00–0.07)
Basophils Absolute: 0 10*3/uL (ref 0.0–0.1)
Basophils Relative: 0 %
Eosinophils Absolute: 0 10*3/uL (ref 0.0–0.5)
Eosinophils Relative: 0 %
HCT: 34.1 % — ABNORMAL LOW (ref 36.0–46.0)
Hemoglobin: 11.8 g/dL — ABNORMAL LOW (ref 12.0–15.0)
Immature Granulocytes: 0 %
Lymphocytes Relative: 7 %
Lymphs Abs: 0.5 10*3/uL — ABNORMAL LOW (ref 0.7–4.0)
MCH: 32.2 pg (ref 26.0–34.0)
MCHC: 34.6 g/dL (ref 30.0–36.0)
MCV: 93.2 fL (ref 80.0–100.0)
Monocytes Absolute: 0.4 10*3/uL (ref 0.1–1.0)
Monocytes Relative: 5 %
Neutro Abs: 7.1 10*3/uL (ref 1.7–7.7)
Neutrophils Relative %: 88 %
Platelets: 174 10*3/uL (ref 150–400)
RBC: 3.66 MIL/uL — ABNORMAL LOW (ref 3.87–5.11)
RDW: 13.3 % (ref 11.5–15.5)
WBC: 8.1 10*3/uL (ref 4.0–10.5)
nRBC: 0 % (ref 0.0–0.2)

## 2020-12-06 LAB — PROTEIN AND GLUCOSE, CSF
Glucose, CSF: 57 mg/dL (ref 40–70)
Total  Protein, CSF: 84 mg/dL — ABNORMAL HIGH (ref 15–45)

## 2020-12-06 LAB — LIPID PANEL
Cholesterol: 429 mg/dL — ABNORMAL HIGH (ref 0–200)
HDL: 50 mg/dL (ref >40)
LDL Cholesterol: 327 mg/dL — ABNORMAL HIGH (ref 0–99)
Total CHOL/HDL Ratio: 8.6 RATIO
Triglycerides: 258 mg/dL — ABNORMAL HIGH (ref <150)
VLDL: 52 mg/dL — ABNORMAL HIGH (ref 0–40)

## 2020-12-06 LAB — COMPREHENSIVE METABOLIC PANEL
ALT: 26 U/L (ref 0–44)
AST: 90 U/L — ABNORMAL HIGH (ref 15–41)
Albumin: 4.1 g/dL (ref 3.5–5.0)
Alkaline Phosphatase: 72 U/L (ref 38–126)
Anion gap: 9 (ref 5–15)
BUN: 11 mg/dL (ref 8–23)
CO2: 22 mmol/L (ref 22–32)
Calcium: 9.3 mg/dL (ref 8.9–10.3)
Chloride: 96 mmol/L — ABNORMAL LOW (ref 98–111)
Creatinine, Ser: 0.74 mg/dL (ref 0.44–1.00)
GFR, Estimated: >60 mL/min (ref >60)
Glucose, Bld: 111 mg/dL — ABNORMAL HIGH (ref 70–99)
Potassium: 3.8 mmol/L (ref 3.5–5.1)
Sodium: 127 mmol/L — ABNORMAL LOW (ref 135–145)
Total Bilirubin: 0.5 mg/dL (ref 0.3–1.2)
Total Protein: 7.5 g/dL (ref 6.5–8.1)

## 2020-12-06 LAB — ECHOCARDIOGRAM COMPLETE
AR max vel: 1.65 cm2
AV Area VTI: 1.71 cm2
AV Area mean vel: 1.48 cm2
AV Mean grad: 5 mmHg
AV Peak grad: 8.1 mmHg
Ao pk vel: 1.42 m/s
Area-P 1/2: 2.29 cm2
Height: 60 in
S' Lateral: 2.6 cm
Weight: 2264.57 oz

## 2020-12-06 LAB — CSF CELL COUNT WITH DIFFERENTIAL
RBC Count, CSF: 0 /mm3
Tube #: 4
WBC, CSF: 4 /mm3 (ref 0–5)

## 2020-12-06 LAB — RPR: RPR Ser Ql: NONREACTIVE

## 2020-12-06 LAB — SARS CORONAVIRUS 2 (TAT 6-24 HRS): SARS Coronavirus 2: NEGATIVE

## 2020-12-06 MED ORDER — HEPARIN (PORCINE) 25000 UT/250ML-% IV SOLN
850.0000 [IU]/h | INTRAVENOUS | Status: DC
  Administered 2020-12-06: 800 [IU]/h via INTRAVENOUS
  Administered 2020-12-08: 600 [IU]/h via INTRAVENOUS
  Administered 2020-12-09 – 2020-12-10 (×2): 850 [IU]/h via INTRAVENOUS
  Filled 2020-12-06 (×5): qty 250

## 2020-12-06 MED ORDER — LORAZEPAM 2 MG/ML IJ SOLN
0.5000 mg | INTRAMUSCULAR | Status: DC | PRN
  Administered 2020-12-06 – 2020-12-08 (×3): 0.5 mg via INTRAVENOUS
  Filled 2020-12-06 (×3): qty 1

## 2020-12-06 MED ORDER — HALOPERIDOL LACTATE 5 MG/ML IJ SOLN
5.0000 mg | Freq: Four times a day (QID) | INTRAMUSCULAR | Status: DC | PRN
  Administered 2020-12-06: 5 mg via INTRAVENOUS
  Filled 2020-12-06: qty 1

## 2020-12-06 MED ORDER — ATORVASTATIN CALCIUM 80 MG PO TABS
80.0000 mg | ORAL_TABLET | Freq: Every day | ORAL | Status: DC
  Administered 2020-12-06 – 2020-12-12 (×7): 80 mg via ORAL
  Filled 2020-12-06 (×7): qty 1

## 2020-12-06 MED ORDER — CYANOCOBALAMIN 1000 MCG/ML IJ SOLN
1000.0000 ug | Freq: Every day | INTRAMUSCULAR | Status: AC
  Administered 2020-12-06 – 2020-12-10 (×5): 1000 ug via INTRAMUSCULAR
  Filled 2020-12-06 (×5): qty 1

## 2020-12-06 MED ORDER — DEXTROSE 5 % IV SOLN
10.0000 mg/kg | Freq: Two times a day (BID) | INTRAVENOUS | Status: DC
  Administered 2020-12-06 – 2020-12-09 (×6): 640 mg via INTRAVENOUS
  Filled 2020-12-06 (×7): qty 12.8

## 2020-12-06 MED ORDER — LEVOTHYROXINE SODIUM 100 MCG/5ML IV SOLN
50.0000 ug | Freq: Every day | INTRAVENOUS | Status: DC
  Administered 2020-12-06 – 2020-12-11 (×6): 50 ug via INTRAVENOUS
  Filled 2020-12-06 (×6): qty 5

## 2020-12-06 NOTE — Consult Note (Addendum)
Cardiology Consultation:   Patient ID: Versa Craton MRN: [id number]; DOB: March 09, 1946  Admit date: 12/05/2020 Date of Consult: 12/06/2020  PCP:  Brooke Bonito, MD   [facility] Providers Cardiologist:  New - Dr. Jacques Navy     Patient Profile:   Jane Navarro is a 75 y.o. female with a hx of anemia, remote tobacco abuse quit in early 2000 and " borderline hypothyroidism" who is being seen 12/06/2020 for the evaluation of abnormal echocardiogram at the request of Dr. Elvera Lennox.  History of Present Illness:   [name] is a pleasant 75 year old female with past medical history of anemia, remote tobacco abuse quit in early 2000 and previously told " borderline hypothyroidism".  Patient has not seen her PCP in several years.  She denies any alcohol use.  Despite her advanced age, she is still driving around and live by herself.  She walks around with a walker due to imbalance from the knee issue and also vertigo.  She denies any prior cardiac or cerebrovascular history.  Her mother does have a history of mini stroke and her father has history of Parkinson's.  She denies any recent fever, chill, chest discomfort or worsening shortness of breath.  She was in her usual state of health until 12/05/2020.  She drove to [employer] to pick up groceries.  She had weakness and difficulty getting back to her car.  Manager at the [employer] was informed that she appears to be sleeping in her car in the parking lot, when they tried to wake up the patient, she was confused.    Patient was subsequently sent to [facility] due to concern of stroke.  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  Blood work showed creatinine was 1.21.  Initial sodium level was 131, however slowly trended down to 127.  Sed rate was elevated as well.  Hemoglobin on arrival  was 10.6.  White blood cell count normal.  Urine drug test negative.  Acetaminophen and salicylate level negative.  TSH however is severely elevated 41.462.  RPR negative.  It was suspected that the patient's altered mental status may be related to severe hypothyroidism.  At the time my interview, patient can only respond very slowly.  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  It has a relatively narrow attachment base.  Otherwise there is no significant valve issue.  The left ventricle mass was felt to be atypical for either thrombus, vegetation and the location unusual for myxoma or papillary febrile elastoma.  Cardiology service was consulted for abnormal echocardiogram.  Note, when patient arrived, her systolic blood pressure was quite high in the 180s.  Now her systolic blood pressures in the 130-150s range.  She is currently not placed on any blood pressure medication due to permissive hypertension waiting for further neurology recommendation   Past Medical History:  Diagnosis Date   Anemia    Tobacco abuse, in remission     Past Surgical History:  Procedure Laterality Date   TOTAL ABDOMINAL HYSTERECTOMY       Home Medications:  Prior to Admission medications   Not on File  Inpatient Medications: Scheduled Meds:  atorvastatin  80 mg Oral Daily   cyanocobalamin  1,000 mcg Intramuscular Daily   enoxaparin (LOVENOX) injection  40 mg Subcutaneous Q24H   levothyroxine  50 mcg Intravenous Daily   sodium chloride flush  3 mL Intravenous Q12H   [START ON 12/13/2020] thiamine injection  100 mg Intravenous Daily   Continuous Infusions:  acyclovir     thiamine injection 500 mg (12/06/20 1436)   Followed by   Melene Muller ON 12/07/2020] thiamine injection     PRN Meds: hydrALAZINE,  ondansetron **OR** ondansetron (ZOFRAN) IV  Allergies:    Allergies  Allergen Reactions   Ampicillin    Ciprofloxacin    Erythromycin    Penicillins     Social History:   Social History   Socioeconomic History   Marital status: Divorced    Spouse name: Not on file   Number of children: Not on file   Years of education: Not on file   Highest education level: Not on file  Occupational History   Not on file  Tobacco Use   Smoking status: Former   Smokeless tobacco: Not on file   Tobacco comments:    Quit smoking in her late 59s  Substance and Sexual Activity   Alcohol use: Not Currently   Drug use: Never   Sexual activity: Not on file  Other Topics Concern   Not on file  Social History Narrative   Not on file   Social Determinants of Health   Financial Resource Strain: Not on file  Food Insecurity: Not on file  Transportation Needs: Not on file  Physical Activity: Not on file  Stress: Not on file  Social Connections: Not on file  Intimate Partner Violence: Not on file    Family History:    Family History  Problem Relation Age of Onset   Breast cancer Mother    Dementia Mother    Transient ischemic attack Mother    Parkinson's disease Father      ROS:  Please see the history of present illness.   All other ROS reviewed and negative.     Physical Exam/Data:   Vitals:   12/05/20 2342 12/06/20 0434 12/06/20 0853 12/06/20 1237  BP: (!) 168/105 (!) 156/97 129/80 (!) 150/92  Pulse: 85 86 70 70  Resp: 18 19 17 18   Temp: (!) 97.5 F (36.4 C) 97.6 F (36.4 C) 98.3 F (36.8 C) 98.1 F (36.7 C)  TempSrc: Oral Oral Oral Oral  SpO2: 100% 100% 96% 99%  Weight:      Height:        Intake/Output Summary (Last 24 hours) at 12/06/2020 1527 Last data filed at 12/06/2020 0430 Gross per 24 hour  Intake --  Output 400 ml  Net -400 ml   Last 3 Weights 12/05/2020 12/16/2014  Weight (lbs) 141 lb 8.6 oz 158 lb  Weight (kg) 64.2 kg 71.668 kg     Body mass index is  27.64 kg/m.  General: Somewhat confused.  Able to answer questions including location, however slow to respond. HEENT: normal Lymph: no adenopathy Neck: no JVD Endocrine:  No thryomegaly Vascular: No carotid bruits; FA pulses 2+ bilaterally without bruits  Cardiac:  normal S1, S2; RRR; no murmur  Lungs:  clear to auscultation bilaterally, no wheezing, rhonchi or rales  Abd: soft, nontender, no hepatomegaly  Ext: no edema Musculoskeletal:  No deformities, BUE and BLE strength normal and equal Skin: warm and dry  Neuro: Unable to assess  psych: Unable to assess  EKG:  The EKG was personally reviewed and demonstrates: Normal sinus rhythm, poor R wave progression in the anterior leads, Q waves in the inferior lead. Telemetry:  Telemetry was personally reviewed and demonstrates: Normal sinus rhythm, no significant ST-T wave changes.  Relevant CV Studies:  Echo 12/06/2020 1. 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 It measures approximately 13 mm in maximum  length and 5 mm in width, with a  relatively narrow attachment base. Left ventricular ejection fraction, by  estimation, is 60 to 65%. The left ventricle has normal function. The left  ventricle has no regional wall motion abnormalities. There is mild  concentric left ventricular  hypertrophy. Left ventricular diastolic parameters are consistent with  Grade I diastolic dysfunction (impaired relaxation). Elevated left atrial  pressure.   2. Right ventricular systolic function is normal. The right ventricular  size is normal.   3. The mitral valve is normal in structure. No evidence of mitral valve  regurgitation. No evidence of mitral stenosis.   4. The aortic valve is normal in structure. Aortic valve regurgitation is  not visualized. No aortic stenosis is present.   5. The inferior vena cava is normal  in size with greater than 50%  respiratory variability, suggesting right atrial pressure of 3 mmHg.   Conclusion(s)/Recommendation(s): Discussed with primary team. The left  ventricular mass is highly atypical for either thrombus or vegetation and  the location is unusual for myxoma or papillary fibroelastoma. Its  mobility suggests significant embolic  potential. While TEE would offer better resolution, it may not add  diagnostic value. Consider cardiac MRI.   Laboratory Data:  High Sensitivity Troponin:  No results for input(s): TROPONINIHS in the last 720 hours.   Chemistry Recent Labs  Lab 12/05/20 1247 12/05/20 1252 12/06/20 0138  NA 131* 132* 127*  K 3.9 3.9 3.8  CL 96* 99 96*  CO2 23  --  22  GLUCOSE 113* 109* 111*  BUN 14 15 11   CREATININE 1.22* 1.00 0.74  CALCIUM 9.5  --  9.3  GFRNONAA 47*  --  >60  ANIONGAP 12  --  9    Recent Labs  Lab 12/05/20 1247 12/06/20 0138  PROT 7.2 7.5  ALBUMIN 4.0 4.1  AST 68* 90*  ALT 22 26  ALKPHOS 66 72  BILITOT 0.4 0.5   Hematology Recent Labs  Lab 12/05/20 1247 12/05/20 1252 12/06/20 0138  WBC 5.8  --  8.1  RBC 3.35*  --  3.66*  HGB 10.6* 11.2* 11.8*  HCT 32.6* 33.0* 34.1*  MCV 97.3  --  93.2  MCH 31.6  --  32.2  MCHC 32.5  --  34.6  RDW 13.8  --  13.3  PLT 201  --  174   BNPNo results for input(s): BNP, PROBNP in the last 168 hours.  DDimer No results for input(s): DDIMER in the last 168 hours.   Radiology/Studies:  ECHOCARDIOGRAM COMPLETE  Result Date: 12/06/2020    ECHOCARDIOGRAM REPORT   Patient Name:   BENNY HENRIE Date of Exam: 12/06/2020 Medical Rec #:  02/05/2021    Height:       60.0 in Accession #:    [id number]   Weight:       141.5 lb Date of Birth:  1945-07-24    BSA:  1.612 m Patient Age:    74 years     BP:           150/92 mmHg Patient Gender: F            HR:           72 bpm. Exam Location:  Inpatient Procedure: 2D Echo, Cardiac Doppler and Color Doppler Indications:    Stroke I63.9   History:        Patient has no prior history of Echocardiogram examinations.                 Signs/Symptoms:Altered Mental Status; Risk Factors:Former                 Smoker. Hypertensive urgency. Hypothyroidism. Passed out in                 parking lot after shopping.  Sonographer:    Roosvelt Maser RDCS Referring Phys: [id number] Beather Ar[place] [facility] IMPRESSIONS  1. 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 It measures approximately 13 mm in maximum length and 5 mm in width, with a relatively narrow attachment base. Left ventricular ejection fraction, by estimation, is 60 to 65%. The left ventricle has normal function. The left ventricle has no regional wall motion abnormalities. There is mild concentric left ventricular hypertrophy. Left ventricular diastolic parameters are consistent with Grade I diastolic dysfunction (impaired relaxation). Elevated left atrial pressure.  2. Right ventricular systolic function is normal. The right ventricular size is normal.  3. The mitral valve is normal in structure. No evidence of mitral valve regurgitation. No evidence of mitral stenosis.  4. The aortic valve is normal in structure. Aortic valve regurgitation is not visualized. No aortic stenosis is present.  5. The inferior vena cava is normal in size with greater than 50% respiratory variability, suggesting right atrial pressure of 3 mmHg. Conclusion(s)/Recommendation(s): Discussed with primary team. The left ventricular mass is highly atypical for either thrombus or vegetation and the location is unusual for myxoma or papillary fibroelastoma. Its mobility suggests significant embolic potential. While TEE would offer better resolution, it may not add diagnostic value. Consider cardiac MRI. 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 It measures approximately 13 mm in maximum length and 5 mm in width, with a relatively narrow attachment base. Left ventricular ejection fraction, by estimation, is 60 to 65%. The left ventricle has normal function. The left ventricle has no regional wall motion abnormalities. The left ventricular internal cavity size was normal in size. There is mild concentric left ventricular hypertrophy. Left ventricular diastolic parameters are consistent with Grade I diastolic dysfunction (impaired relaxation). Elevated left atrial pressure. Right Ventricle: The right ventricular size is normal. No increase in right ventricular wall thickness. Right ventricular systolic function is normal. Left Atrium: Left atrial size was normal in size. Right Atrium: Right atrial size was normal in size. Pericardium: There is no evidence of pericardial effusion. Mitral Valve: The mitral valve is normal in structure. No evidence of mitral valve regurgitation. No evidence of mitral valve stenosis. Tricuspid Valve: The tricuspid valve is normal in structure. Tricuspid valve regurgitation is not demonstrated. No evidence of tricuspid stenosis. Aortic Valve: The aortic valve is normal in structure. Aortic valve regurgitation is not  visualized. No aortic stenosis is present. Aortic valve mean gradient measures 5.0 mmHg. Aortic valve peak gradient measures 8.1 mmHg. Aortic valve area, by VTI measures 1.71 cm. Pulmonic Valve: The pulmonic valve was normal in structure. Pulmonic valve regurgitation is not visualized. No evidence of pulmonic stenosis. Aorta: The aortic root is normal in size and structure. Venous: The inferior vena cava is normal in size with greater than 50% respiratory variability, suggesting right atrial pressure of 3 mmHg. IAS/Shunts: No atrial level shunt detected by color flow Doppler.  LEFT VENTRICLE PLAX 2D LVIDd:         3.60 cm LVIDs:          2.60 cm  Diastology LV PW:         1.20 cm  LV e' medial:    3.97 cm/s LV IVS:        1.50 cm  LV E/e' medial:  16.8 LVOT diam:     1.70 cm  LV e' lateral:   5.35 cm/s LV SV:         49       LV E/e' lateral: 12.5 LV SV Index:   30 LVOT Area:     2.27 cm  RIGHT VENTRICLE RV Basal diam:  3.00 cm LEFT ATRIUM             Index       RIGHT ATRIUM           Index LA diam:        2.10 cm 1.30 cm/m  RA Area:     10.60 cm LA Vol (A2C):   36.7 ml 22.77 ml/m RA Volume:   21.10 ml  13.09 ml/m LA Vol (A4C):   21.6 ml 13.40 ml/m LA Biplane Vol: 30.1 ml 18.68 ml/m  AORTIC VALVE AV Area (Vmax):    1.65 cm AV Area (Vmean):   1.48 cm AV Area (VTI):     1.71 cm AV Vmax:           142.00 cm/s AV Vmean:          105.000 cm/s AV VTI:            0.285 m AV Peak Grad:      8.1 mmHg AV Mean Grad:      5.0 mmHg LVOT Vmax:         103.00 cm/s LVOT Vmean:        68.300 cm/s LVOT VTI:          0.215 m LVOT/AV VTI ratio: 0.75  AORTA Ao Root diam: 2.40 cm MITRAL VALVE MV Area (PHT): 2.29 cm     SHUNTS MV Decel Time: 332 msec     Systemic VTI:  0.22 m MV E velocity: 66.80 cm/s   Systemic Diam: 1.70 cm MV A velocity: 109.00 cm/s MV E/A ratio:  0.61 Mihai Croitoru MD Electronically signed by Thurmon Fair MD Signature Date/Time: 12/06/2020/2:42:42 PM    Final    CT HEAD CODE STROKE WO CONTRAST  Result Date: 12/05/2020 CLINICAL DATA:  Code stroke. Neuro deficit, acute, stroke suspected. EXAM: CT HEAD WITHOUT CONTRAST TECHNIQUE: Contiguous axial images were obtained from the base of the skull through the vertex without intravenous contrast. COMPARISON:  Report from head CT 11/21/1998 (images currently unavailable). FINDINGS: Brain: Cerebral volume is normal for age. 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 Background mild multifocal T2/FLAIR hyperintensity within the cerebral white matter, nonspecific but compatible with chronic  small vessel ischemic disease. There is no acute intracranial  hemorrhage. No demarcated cortical infarct. No extra-axial fluid collection. No evidence of an intracranial mass. No midline shift. Vascular: No hyperdense vessel.  Atherosclerotic calcifications. Skull: Normal. Negative for fracture or focal lesion. Sinuses/Orbits: Visualized orbits show no acute finding. No significant paranasal sinus disease. ASPECTS [facility]([place] Stroke Program Early CT Score) - Ganglionic level infarction (caudate, lentiform nuclei, internal capsule, insula, M1-M3 cortex): 7 - Supraganglionic infarction (M4-M6 cortex): 3 Total score (0-10 with 10 being normal): 10 (when discounting chronic infarcts). These results were communicated to Dr. Selina CooleyStack At 1:18 pmon 9/3/2022by text page via the [facility] messaging system. IMPRESSION: No evidence of acute intracranial abnormality. 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 Background mild chronic small vessel ischemic changes within the cerebral white matter. Electronically Signed   By: Jackey LogeKyle  Golden D.O.   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  Dr. Jacques NavyAcharya also recommended placing the patient on IV heparin if okay by neurology service for prophylactic anticoagulation until we can assess the LV mass further with cardiac MRI.  Altered mental status: Initial CT of the head showed no acute process.  Followed by neurology service.  May consider brain MRI at some point.  Felt to be driven by severe hypothyroidism.  Severe hypothyroidism: TSH 41.462.  Pending thyroid ultrasound.  Started on IV levothyroxine  History of CVA: CT of the head shows no acute changes, however did reveal chronic  appearing lacunar infarct in the right caudate nucleus  AKI: Improving since admission.  Creatinine on arrival was 1.28  Hyponatremia: May be driven by hypothyroidism.   Risk Assessment/Risk Scores:                For questions or updates, please contact [facility] Please consult [email or web address] for contact info under    Signed, Azalee CourseHao Meng, [place]  12/06/2020 3:27 PM  Patient seen and examined with Azalee CourseHao Meng, PA.  Agree as above, with the following exceptions and changes as noted below.  Mrs. Quintella BatonBuckner is a 75 year old female with a history of hypothyroidism, tobacco use history, anemia who presents with altered mental status and encephalopathy, with cardiology consultation for abnormal echocardiogram.  Her 2 sons and daughter are in the room and provide additional history.  She was in her usual state of health when she went shopping at the grocery store.  When she returned to her car she did not leave the grocery store parking lot, and was later found given concerns that somebody was sleeping in their car.  She was in fact encephalopathic at the time and was brought to the emergency department.  Imaging for stroke performed emergently with no acute stroke on head CT.  There was evidence of chronic small infarcts of unknown etiology at this time.  Thyroid ultrasound also performed with nonvisualization of the thyroid, suggesting parenchymal atrophy.  TSH is quite elevated at 41.  She has been started on levothyroxine as well as IV acyclovir.  An echocardiogram was ordered for possible etiology of stroke versus encephalopathy.  Incidental finding of a highly mobile atypical hyperechoic mass in the left ventricular  cavity attached to the lateral wall endocardium just apical to the papillary muscle base was noted.  Maximum dimension approximately 13 mm.  Appears linear, serpiginous and highly mobile.  Biventricular function was felt to be normal and there is mild LVH.  No definite evidence of  noncompaction features on independent review of the echo with the reading physician, nor was there any apical hypokinesis as a nidus for thrombus.  Concern was raised for possible thrombus versus mass.  She has had no known viral illness recently to cause a prothrombotic state.  She does not routinely see the doctor so her medical history is somewhat unclear.  Her family states that she has been well up until this point.  Gen: NAD, CV: RRR, no murmurs, Lungs: clear, Abd: soft, Extrem: Warm, well perfused, no edema, Neuro/Psych: Drowsy, participates in the conversation but speech is slow though appropriate. All available labs, radiology testing, previous records reviewed.  It is unclear what this highly mobile mass in the heart represents.  If she has some sort of cardiomyopathy, it could represent thrombus.  However other possible etiologies are tumor, versus something much more benign such as a torn anomalous papillary muscle or anomalous cord.  She has no mitral valve regurgitation on echocardiogram so I do not suspect this is papillary muscle rupture or disruption of the primary or secondary cords of the mitral valve.  Given history of prior strokes on imaging and unclear etiology of this mass, we can consider trial of anticoagulation with IV heparin for a few days.  Would recommend repeat echocardiogram in several days to see if there is any change in the size or appearance of this linear mobile echo mass.  If no change, could consider cardiac MRI for tissue characterization of this lesion as well as the myocardium.  If there is a primary pathologic process in the myocardium, that may help [place] understand what this may be.  The lesion is highly mobile and may be very challenging to characterize on MRI.  The primary utility of MRI would be to distinguish thrombus versus mass versus normal myocardium.  Agree with treatment of underlying metabolic disorders including hypothyroidism, hyponatremia.  Parke Poisson, MD 12/06/20 5:48 PM

## 2020-12-06 NOTE — Progress Notes (Addendum)
ANTICOAGULATION CONSULT NOTE - Initial Consult  Pharmacy Consult for heparin  Indication:  LV thrombus  Allergies  Allergen Reactions   Ampicillin    Ciprofloxacin    Erythromycin    Penicillins     Patient Measurements: Height: 5' (152.4 cm) Weight: 64.2 kg (141 lb 8.6 oz) IBW/kg (Calculated) : 45.5   Vital Signs: Temp: 98.1 F (36.7 C) (09/04 1237) Temp Source: Oral (09/04 1237) BP: 150/92 (09/04 1237) Pulse Rate: 70 (09/04 1237)  Labs: Recent Labs    12/05/20 1247 12/05/20 1252 12/06/20 0138  HGB 10.6* 11.2* 11.8*  HCT 32.6* 33.0* 34.1*  PLT 201  --  174  APTT 30  --   --   LABPROT 13.4  --   --   INR 1.0  --   --   CREATININE 1.22* 1.00 0.74    Estimated Creatinine Clearance: 51.6 mL/min (by C-G formula based on SCr of 0.74 mg/dL).   Medical History: Past Medical History:  Diagnosis Date   Anemia    Tobacco abuse, in remission    Assessment: 74yof admitted for neurochange and possible stroke.  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  Plan to start heparin drip no bolus.   Cbc stable   Goal of Therapy:  Heparin level 0.3-0.5 units/ml Monitor platelets by anticoagulation protocol: Yes   Plan:  No bolus  Heparin drip 800 uts/hr  Draw heparin level 6hr after start  Daily heparin level and CBC     Leota Sauers Pharm.D. CPP, BCPS Clinical Pharmacist [phone] 12/06/2020 5:18 PM

## 2020-12-06 NOTE — Progress Notes (Signed)
Called by Rn. Pt is agitated and pulling on lines and trying to get up.  Has ativan ordered and given around 6 30 pm.  Was in wrist restraints last night but was not pulling lines during day so is not in them now.  Give ativan dose now.  Reordered wrist restraints for patient overnight.

## 2020-12-06 NOTE — Evaluation (Signed)
Occupational Therapy Evaluation Patient Details Name: Jane Navarro MRN: [id number] DOB: [birth year] Today's Date: 12/06/2020    History of Present Illness Jane Navarro is a 75 yo female with PMHx of kidney stone and rest unknown because she has not been to a provider in years. 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   Clinical Impression   This 75 yo female admitted with above presents to acute OT with PLOF of being totally independent with all basic ADLs, IADLs, and driving/shopping. Currently she is at a setup/S-Min A level with all basic ADLs with lack of insight into deficits and safety. She will benefit from acute OT with follow up OT at SNF unless family can provide 24/7 care then [facility] could be considered.    Follow Up Recommendations  SNF;Supervision/Assistance - 24 hour;Other (comment) (if 24/7 A could possibly go home with HHOT)    Equipment Recommendations  3 in 1 bedside commode       Precautions / Restrictions Precautions Precautions: Fall Precaution Comments: Short R step length, lending to imbalance and RW too far forward Restrictions Weight Bearing Restrictions: No Other Position/Activity Restrictions: be aware of L knee pain with bearing weight      Mobility Bed Mobility Overal bed mobility: Needs Assistance Bed Mobility: Supine to Sit     Supine to sit: Min assist;HOB elevated     General bed mobility comments: Good initiation when asked to get up to EOB; Slow moving; cues to scoot fully to EOB    Transfers Overall transfer level: Needs assistance Equipment used: Rolling walker (2 wheeled) Transfers: Sit to/from Stand Sit to Stand: Min assist          General transfer comment: Very slow rise with dependence on UEs to push up, and control descent to sit    Balance Overall balance assessment: Needs assistance Sitting-balance support: No upper extremity supported;Feet supported Sitting balance-Leahy Scale: Poor (approaching fair) Sitting balance - Comments: Needs assist/close guard for functional task, dressing, at EOB; posterior and left lateral lean at times (VCs to correct intermittently)   Standing balance support: No upper extremity supported;During functional activity Standing balance-Leahy Scale: Poor (approaching fair) Standing balance comment: left lateral lean at times--while standing to wash face at sink                           ADL either performed or assessed with clinical judgement   ADL Overall ADL's : Needs assistance/impaired Eating/Feeding: NPO   Grooming: Wash/dry face;Min guard;Standing   Upper Body Bathing: Set up;Supervision/ safety;Sitting   Lower Body Bathing: Minimal assistance;Sit to/from stand   Upper Body Dressing : Minimal assistance;Sitting   Lower Body Dressing: Minimal assistance;Sit to/from stand   Toilet Transfer: Minimal assistance;Ambulation;RW;BSC Toilet Transfer Details (indicate cue type and reason): over toilet, VCs for hand placement and needs A to [employer] (at home she as 3 wheeled RW) Toileting- Architect and Hygiene: Minimal assistance;Sit to/from stand               Vision Baseline Vision/History: 1 Wears glasses Ability to See in Adequate Light: 0 Adequate              Pertinent Vitals/Pain Pain Assessment: Faces Pain Score:  Marland Kitchen  it's not a 10") Faces Pain Scale: Hurts a little bit Pain Location: L knee with weight bearing (standing, walking) Pain Descriptors / Indicators: Aching Pain Intervention(s): Monitored during session;Repositioned     Hand Dominance Right   Extremity/Trunk Assessment Upper Extremity Assessment Upper Extremity  Assessment: Overall WFL for tasks assessed   Lower Extremity Assessment Lower Extremity Assessment: Generalized weakness;LLE deficits/detail (with dependence on bil UEs for push to stand and control of descent to sit) LLE Deficits / Details: Full ROM in open chain; Pain with weight bearing; wears a knee sleeve   Cervical / Trunk Assessment Cervical / Trunk Assessment: Kyphotic   Communication Communication Communication: Expressive difficulties;Other (comment) (slow, slurred speech at times, difficult to understand (per MD due to extreme hypothyroidism))   Cognition Arousal/Alertness: Awake/alert Behavior During Therapy: WFL for tasks assessed/performed Overall Cognitive Status: Impaired/Different from baseline Area of Impairment: Safety/judgement;Awareness;Problem solving;Attention                   Current Attention Level: Sustained (difficulty multi-tasking)     Safety/Judgement: Decreased awareness of safety (doesn't keep hands on RW at all times while ambulating.) Awareness: Intellectual Problem Solving: Slow processing;Requires verbal cues;Requires tactile cues General Comments: Initially identified knee pain issues as reason for difficulty at grocery store; "I overdid it"; Slow to answer questions   General Comments  Pt seemed open to post-acute rehab during conversation re: DC plan; L elbow dressing coming off; informed Caryn Bee, RN            Home Living Family/patient expects to be discharged to:: Private residence Living Arrangements: Alone Available Help at Discharge: Family;Other (Comment) (check in) Type of Home: House Home Access: Stairs to enter [employer] of Steps: 4   Home Layout: One level     Bathroom Shower/Tub: Producer, television/film/video: Standard     Home Equipment: Shower seat;Grab bars - tub/shower;Grab bars - toilet          Prior Functioning/Environment Level of Independence: Independent with assistive device(s)         Comments: Uses a 3 point RW; still driving, shopping; has a little dog named Bella        OT Problem List: Decreased strength;Decreased range of motion;Impaired balance (sitting and/or standing);Decreased cognition;Decreased safety awareness;Pain      OT Treatment/Interventions: Self-care/ADL training;DME and/or AE instruction;Patient/family education;Balance training    OT Goals(Current goals can be found in the care plan section) Acute Rehab OT Goals Patient Stated Goal: to figure out where her little dog is OT Goal Formulation: With patient Time For Goal Achievement: 12/20/20 Potential to Achieve Goals: Good  OT Frequency: Min 2X/week   Barriers to D/C: Decreased caregiver support          Co-evaluation PT/OT/SLP Co-Evaluation/Treatment: Yes Reason for Co-Treatment: To address functional/ADL transfers PT goals addressed during session: Mobility/safety with mobility OT goals addressed during session: Strengthening/ROM;ADL's and self-care      AM-PAC OT "6 Clicks" Daily Activity     Outcome Measure Help from another person eating meals?: A Little Help from another person taking care of personal grooming?: A Little Help from another person toileting, which includes using toliet, bedpan, or urinal?: A Little Help from another person bathing (including washing, rinsing, drying)?: A Little Help from another person to put on and taking off regular upper body clothing?: A Little Help from another person to put on and taking off regular lower body clothing?: A Little 6 Click Score: 18   End of  Session Equipment Utilized During Treatment: Gait belt;Rolling walker Nurse Communication: Mobility status (called front desk to let them know pt needed a new purewick)  Activity Tolerance: Patient tolerated treatment well Patient left: in chair;with call bell/phone within reach;with chair alarm set  OT Visit Diagnosis: Unsteadiness on feet (R26.81);Other abnormalities of gait and  mobility (R26.89);Muscle weakness (generalized) (M62.81);Other symptoms and signs involving cognitive function;Pain Pain - Right/Left: Left Pain - part of body: Knee                Time: 2197-5883 OT Time Calculation (min): 31 min Charges:  OT General Charges $OT Visit: 1 Visit OT Evaluation $OT Eval Moderate Complexity: 1 Mod  Ignacia Palma, OTR/L Acute [employer] Pager [phone] Office [phone]    Evette Georges 12/06/2020, 10:19 AM

## 2020-12-06 NOTE — Progress Notes (Signed)
PROGRESS NOTE  Jane Navarro DEY:[id number] DOB: 1945/12/08 DOA: 12/05/2020 PCP: Brooke Bonito, MD   LOS: 1 day   Brief Narrative / Interim history: 75 year old female with significant history of anemia, tobacco use in the past, comes into the hospital after being found altered in her car in the [employer] driving lot.  She apparently went in, did grocery shopping and then got back into her car and passed out.  EMS was called and she was brought to the hospital.  Patient does not have a PCP and does not seek regular care and is not on any medications.  Son apparently talked to her over the phone 6 days prior to admission and she seems to be in her normal state of health.  She was admitted as a code stroke.  On admission she underwent an LP and was placed on empiric acyclovir  Subjective / 24h Interval events: She is alert this morning, slow to respond and speaks very slowly but very appropriate.  She recalls being in the parking lot and she tells me she thinks she "overdid it, shopping too much"  Assessment & Plan: Principal Problem Severe hypothyroidism-work-up on admission revealed a significantly elevated TSH to 41.4.  Patient tells me she has a "borderline hypothyroidism" but cannot elaborate further.  Family unaware about any thyroid issues. -We will start IV Synthroid today, monitor clinical course.  Will convert to p.o. when she is more alert and has consistent oral intake. -Obtain thyroid ultrasound for screening purposes -Repeat TSH in 3 to 4 weeks  Active Problems Acute metabolic encephalopathy-possibly main driver is untreated hypothyroidism.  She is better today but still somewhat confused, slow to respond, and per family she is far from baseline, which she is independent, driving. -She has been placed on antivirals after receiving an LP, HSV PCR pending  Hyperlipidemia-her LDL is 327.  Start statin  Probable essential hypertension-she had 2 high readings in the 150-160s  systolic, most recent 1 normotensive.  Hold off starting BP meds for now but if she persists with high readings we will have to initiate antihypertensives  Borderline B12 deficiency-started on supplementation  Chronic kidney disease stage IIIa-creatinine 1.22 which appears similar to the one in 2016.  Hyponatremia-likely hypothyroidism induced.  Monitor.  Elevated LFTs-in a pattern that raises suspicion for EtOH use.  Son tells me that she drink in the past but he is unaware of her currently drinking   Scheduled Meds:  cyanocobalamin  1,000 mcg Intramuscular Daily   enoxaparin (LOVENOX) injection  40 mg Subcutaneous Q24H   levothyroxine  50 mcg Intravenous Daily   sodium chloride flush  3 mL Intravenous Q12H   [START ON 12/13/2020] thiamine injection  100 mg Intravenous Daily   Continuous Infusions:  sodium chloride 125 mL/hr at 12/06/20 0120   acyclovir     thiamine injection 500 mg (12/06/20 0518)   Followed by   Melene Muller ON 12/07/2020] thiamine injection     PRN Meds:.hydrALAZINE, ondansetron **OR** ondansetron (ZOFRAN) IV  Diet Orders (From admission, onward)     Start     Ordered   12/06/20 0957  DIET DYS 2 Room service appropriate? Yes with Assist; Fluid consistency: Thin  Diet effective now       Question Answer Comment  Room service appropriate? Yes with Assist   Fluid consistency: Thin      12/06/20 0956            DVT prophylaxis: enoxaparin (LOVENOX) injection 40 mg Start: 12/05/20 1600  Code Status: Full Code  Family Communication: Son present at bedside  Status is: Inpatient  Remains inpatient appropriate because:Altered mental status and Inpatient level of care appropriate due to severity of illness  Dispo: The patient is from: Home              Anticipated d/c is to: SNF              Patient currently is not medically stable to d/c.   Difficult to place patient No   Level of care: Telemetry Medical  Consultants:  none  Procedures:   none  Microbiology  none  Antimicrobials: none    Objective: Vitals:   12/05/20 2017 12/05/20 2342 12/06/20 0434 12/06/20 0853  BP: (!) 140/95 (!) 168/105 (!) 156/97 129/80  Pulse: 84 85 86 70  Resp: 18 18 19    Temp: (!) 97.5 F (36.4 C) (!) 97.5 F (36.4 C) 97.6 F (36.4 C) 98.3 F (36.8 C)  TempSrc: Oral Oral Oral   SpO2: 97% 100% 100% 96%  Weight:      Height:        Intake/Output Summary (Last 24 hours) at 12/06/2020 1104 Last data filed at 12/06/2020 0430 Gross per 24 hour  Intake --  Output 400 ml  Net -400 ml   Filed Weights   12/05/20 1200  Weight: 64.2 kg    Examination:  Constitutional: NAD Eyes: no scleral icterus ENMT: Mucous membranes are moist.  Neck: normal, supple Respiratory: clear to auscultation bilaterally, no wheezing, no crackles. Normal respiratory effort.  Cardiovascular: Regular rate and rhythm, no murmurs / rubs / gallops. No LE edema. Abdomen: non distended, no tenderness. Bowel sounds positive.  Musculoskeletal: no clubbing / cyanosis.  Skin: no rashes Neurologic: CN 2-12 grossly intact. Strength 5/5 in all 4.  Psychiatric: Alert and oriented x3 but slow to respond   Data Reviewed: I have independently reviewed following labs and imaging studies   CBC: Recent Labs  Lab 12/05/20 1247 12/05/20 1252 12/06/20 0138  WBC 5.8  --  8.1  NEUTROABS 3.9  --  7.1  HGB 10.6* 11.2* 11.8*  HCT 32.6* 33.0* 34.1*  MCV 97.3  --  93.2  PLT 201  --  174   Basic Metabolic Panel: Recent Labs  Lab 12/05/20 1247 12/05/20 1252 12/06/20 0138  NA 131* 132* 127*  K 3.9 3.9 3.8  CL 96* 99 96*  CO2 23  --  22  GLUCOSE 113* 109* 111*  BUN 14 15 11   CREATININE 1.22* 1.00 0.74  CALCIUM 9.5  --  9.3   Liver Function Tests: Recent Labs  Lab 12/05/20 1247 12/06/20 0138  AST 68* 90*  ALT 22 26  ALKPHOS 66 72  BILITOT 0.4 0.5  PROT 7.2 7.5  ALBUMIN 4.0 4.1   Coagulation Profile: Recent Labs  Lab 12/05/20 1247  INR 1.0    HbA1C: No results for input(s): HGBA1C in the last 72 hours. CBG: Recent Labs  Lab 12/05/20 1248  GLUCAP 110*    Recent Results (from the past 240 hour(s))  CSF culture w Stat Gram Stain     Status: None (Preliminary result)   Collection Time: 12/05/20  3:50 PM   Specimen: CSF; Cerebrospinal Fluid  Result Value Ref Range Status   Specimen Description CSF  Final   Special Requests NONE  Final   Gram Stain   Final    WBC PRESENT, PREDOMINANTLY MONONUCLEAR NO ORGANISMS SEEN CYTOSPIN SMEAR    Culture   Final  NO GROWTH < 24 HOURS Performed at [facility], [address], [place], [place] [address]    Report Status PENDING  Incomplete  SARS CORONAVIRUS 2 (TAT 6-24 HRS) Nasopharyngeal Nasopharyngeal Swab     Status: None   Collection Time: 12/05/20  6:47 PM   Specimen: Nasopharyngeal Swab  Result Value Ref Range Status   SARS Coronavirus 2 NEGATIVE NEGATIVE Final    Comment: (NOTE) SARS-CoV-2 target nucleic acids are NOT DETECTED.  The SARS-CoV-2 RNA is generally detectable in upper and lower respiratory specimens during the acute phase of infection. Negative results do not preclude SARS-CoV-2 infection, do not rule out co-infections with other pathogens, and should not be used as the sole basis for treatment or other patient management decisions. Negative results must be combined with clinical observations, patient history, and epidemiological information. The expected result is Negative.  Fact Sheet for Patients: HairSlick.no  Fact Sheet for Healthcare Providers: quierodirigir.com  This test is not yet approved or cleared by the [place] FDA and  has been authorized for detection and/or diagnosis of SARS-CoV-2 by FDA under an Emergency Use Authorization (EUA). This EUA will remain  in effect (meaning this test can be used) for the duration of the COVID-19 declaration under Se ction 564(b)(1) of the  Act, 21 U.S.C. section 360bbb-3(b)(1), unless the authorization is terminated or revoked sooner.  Performed at [facility], [address] [place], [place] [address]      Radiology Studies: CT HEAD CODE STROKE WO CONTRAST  Result Date: 12/05/2020 CLINICAL DATA:  Code stroke. Neuro deficit, acute, stroke suspected. EXAM: CT HEAD WITHOUT CONTRAST TECHNIQUE: Contiguous axial images were obtained from the base of the skull through the vertex without intravenous contrast. COMPARISON:  Report from head CT 11/21/1998 (images currently unavailable). FINDINGS: Brain: Cerebral volume is normal for age. 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 Background mild multifocal T2/FLAIR hyperintensity within the cerebral white matter, nonspecific but compatible with chronic small vessel ischemic disease. There is no acute intracranial hemorrhage. No demarcated cortical infarct. No extra-axial fluid collection. No evidence of an intracranial mass. No midline shift. Vascular: No hyperdense vessel.  Atherosclerotic calcifications. Skull: Normal. Negative for fracture or focal lesion. Sinuses/Orbits: Visualized orbits show no acute finding. No significant paranasal sinus disease. ASPECTS [facility] Stroke Program Early CT Score) - Ganglionic level infarction (caudate, lentiform nuclei, internal capsule, insula, M1-M3 cortex): 7 - Supraganglionic infarction (M4-M6 cortex): 3 Total score (0-10 with 10 being normal): 10 (when discounting chronic infarcts). These results were communicated to Dr. Selina Cooley At 1:18 pmon 9/3/2022by text page via the [facility] messaging system. IMPRESSION: No evidence of acute intracranial abnormality. 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 Background mild chronic small vessel ischemic changes within the cerebral white matter. Electronically Signed   By: Jackey Loge D.O.   On: 12/05/2020 13:18    Pamella Pert, MD,  PhD [facility]  Between 7 am - 7 pm I am available, please contact me via Amion (for emergencies) or Securechat (non urgent messages)  Between 7 pm - 7 am I am not available, please contact night coverage MD/APP via Amion

## 2020-12-06 NOTE — Progress Notes (Signed)
  Echocardiogram 2D Echocardiogram has been performed.  Jane Navarro F 12/06/2020, 1:28 PM

## 2020-12-06 NOTE — Evaluation (Signed)
Physical Therapy Evaluation Patient Details Name: Jane Navarro MRN: [id number] DOB: 1945-07-11 Today's Date: 12/06/2020   History of Present Illness  [name] is a 75 yo female with PMHx of kidney stone and rest unknown because she has not been to a provider in years. 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  Clinical Impression  Pt admitted with above diagnosis. Lives home alone (with little dog) and typically independent with ADLs, and walks with a 3 wheeled rW; still driving, shopping; 4 steps to enter her single level home; Presetns to PT with slow, uncoordianted, near bradykinetic movement, generalized weikness, L knee pain leading to gait unsteadiness; She must be independent and safe to dc home alone, and at this point, we must consider post-acute rehab, likely SNF; Pt currently with functional limitations due to the deficits listed below (see PT Problem List). Pt will benefit from skilled PT to increase their independence and safety with mobility to allow discharge to the venue listed below.       Follow Up Recommendations SNF;Other (comment) (Will monitor for progress; if REALLY good progress, may be able to dc to Jane Navarro's home)    Equipment Recommendations  Rolling walker with 5" wheels;3in1 (PT)    Recommendations for Other Services OT consult (as orfered)     Precautions / Restrictions Precautions Precautions: Fall Precaution Comments: Short R step length, lending to imbalance and RW too far forward Restrictions Weight Bearing Restrictions: No Other Position/Activity Restrictions: be aware of L knee pain with bearing weight      Mobility  Bed Mobility Overal bed mobility: Needs Assistance Bed Mobility: Supine to  Sit     Supine to sit: Min assist;HOB elevated     General bed mobility comments: Good initiation when asked to get up to EOB; Slow moving; cues to scoot fully to EOB    Transfers Overall transfer level: Needs assistance Equipment used: Rolling walker (2 wheeled) Transfers: Sit to/from Stand Sit to Stand: Min assist         General transfer comment: Very slow rise with dependence on UEs to puh up, and control descent to sit  Ambulation/Gait Ambulation/Gait assistance: Min assist Gait Distance (Feet): 25 Feet (to and from bathroom) Assistive device: Rolling walker (2 wheeled) Gait Pattern/deviations: Decreased step length - right;Decreased step length - left;Decreased stance time - left;Trunk flexed     General Gait Details: Asymmetric step length, likely related to more L knee pain in stance, leading to very short step length and tendency for center of mass to translate too far forward; leading to significantly incr fall risk  Stairs            Wheelchair Mobility    Modified Rankin (Stroke Patients Only)       Balance Overall balance assessment: Needs assistance   Sitting balance-Leahy Scale: Poor (approaching Fair) Sitting balance - Comments: Needs assist/close guard for functional task, dressing, at EOB     Standing balance-Leahy Scale: Poor (approaching Fair)                               Pertinent Vitals/Pain Pain Assessment: Faces Pain Score:  ("it's not a 10") Faces Pain Scale: Hurts little more Pain Location: L knee with weight  bearing (standing, walking) Pain Descriptors / Indicators: Aching Pain Intervention(s): Monitored during session    Home Living Family/patient expects to be discharged to:: Private residence Living Arrangements: Alone Available Help at Discharge: Family;Other (Comment) (check in) Type of Home: House Home Access: Stairs to enter   [employer] of Steps: 4 Home Layout: One level Home Equipment:  Shower seat;Grab bars - tub/shower;Grab bars - toilet      Prior Function Level of Independence: Independent with assistive device(s)         Comments: Uses a 3 point RW; still driving, shopping; has a little dog named Jane Navarro Dominance   Dominant Hand: Right    Extremity/Trunk Assessment   Upper Extremity Assessment Upper Extremity Assessment: Defer to OT evaluation    Lower Extremity Assessment Lower Extremity Assessment: Generalized weakness;LLE deficits/detail (with dependence on bil UEs for push to stand and control of descent to sit) LLE Deficits / Details: Full ROM in open chain; Pain with weight bearing; wears a knee sleeve    Cervical / Trunk Assessment Cervical / Trunk Assessment: Kyphotic  Communication   Communication: Expressive difficulties;Other (comment) (slow, slurred speech; at times difficult to understand)  Cognition Arousal/Alertness: Awake/alert Behavior During Therapy: WFL for tasks assessed/performed Overall Cognitive Status: Impaired/Different from baseline Area of Impairment: Safety/judgement;Awareness;Problem solving;Attention                   Current Attention Level: Sustained (Difficulty multi-tasking)     Safety/Judgement: Decreased awareness of safety (hands off RW when not fully to destination (toilet, sink, recliner)) Awareness: Intellectual Problem Solving: Slow processing;Requires verbal cues;Requires tactile cues General Comments: Initially identified knee pain issues as reason for difficulty at grocery store; "I overdid it"; Slow to answer questions      General Comments General comments (skin integrity, edema, etc.): Pt seemed open to post-acute rehab during conversation re: DC plan; L elbow dressing coming off; informed Jane Bee, RN    Exercises     Assessment/Plan    PT Assessment Patient needs continued PT services  PT Problem List Decreased strength;Decreased range of motion;Decreased activity  tolerance;Decreased mobility;Decreased balance;Decreased coordination;Decreased cognition;Decreased knowledge of use of DME;Decreased safety awareness;Decreased knowledge of precautions       PT Treatment Interventions DME instruction;Gait training;Stair training;Functional mobility training;Therapeutic activities;Therapeutic exercise;Balance training;Neuromuscular re-education;Cognitive remediation;Patient/family education    PT Goals (Current goals can be found in the Care Plan section)  Acute Rehab PT Goals Patient Stated Goal: Thyroid to work better PT Goal Formulation: With patient Time For Goal Achievement: 12/20/20 Potential to Achieve Goals: Good    Frequency Min 3X/week   Barriers to discharge Decreased caregiver support;Other (comment) Noted Jane Navarro has been hoping pt would move in with him; wil lmonitor for progress; likely SNF for rehab then to Jane Navarro's home; if excellent progress as thyroid function/meds are dialed in, can consider dc to Jane Navarro's home    Co-evaluation PT/OT/SLP Co-Evaluation/Treatment: Yes Reason for Co-Treatment: To address functional/ADL transfers;Other (comment) (For initial eval/ interdisciplenary formulation of dc plan) PT goals addressed during session: Mobility/safety with mobility         AM-PAC PT "6 Clicks" Mobility  Outcome Measure Help needed turning from your back to your side while in a flat bed without using bedrails?: None Help needed moving from lying on your back to sitting on the side of a flat bed without using bedrails?: A Little Help needed moving to and from a bed to a chair (including a wheelchair)?: A Little Help needed  standing up from a chair using your arms (e.g., wheelchair or bedside chair)?: A Little Help needed to walk in hospital room?: A Lot Help needed climbing 3-5 steps with a railing? : A Lot 6 Click Score: 17    End of Session Equipment Utilized During Treatment: Gait belt Activity Tolerance: Patient tolerated treatment  well;Other (comment) (even with incr pain L knee) Patient left: in chair;with call bell/phone within reach;with chair alarm set Nurse Communication: Mobility status PT Visit Diagnosis: Unsteadiness on feet (R26.81);Other abnormalities of gait and mobility (R26.89);Pain Pain - Right/Left: Left Pain - part of body: Knee    Time: 0817-0849 PT Time Calculation (min) (ACUTE ONLY): 32 min   Charges:   PT Evaluation $PT Eval Moderate Complexity: 1 Mod          [facility] Van Clines, PT  Acute Rehabilitation Services Pager [phone] Office [phone]   Levi Aland 12/06/2020, 9:29 AM

## 2020-12-06 NOTE — Progress Notes (Signed)
@  7:47pm Pt. arrived to MRI with daughter after receiving ativan. Upon arrival pt. jaw was having uncontrollable movement as were her arms and legs. Waited 10+ minutes for medication to kick in, unsuccessful. Called RN for a resolution and was able to get pt. More ativan and haldol, waited 20+ minutes for medication to set in and still no success, pt. continues to toss and turn in stretcher. Unfit for MRI at this time, sent back to room.

## 2020-12-06 NOTE — Plan of Care (Signed)
  Problem: Ischemic Stroke/TIA Tissue Perfusion: Goal: Complications of ischemic stroke/TIA will be minimized Outcome: Progressing   Problem: Ischemic Stroke/TIA Tissue Perfusion: Goal: Complications of ischemic stroke/TIA will be minimized Outcome: Progressing   Problem: Spontaneous Subarachnoid Hemorrhage Tissue Perfusion: Goal: Complications of Spontaneous Subarachnoid Hemorrhage will be minimized Outcome: Progressing

## 2020-12-06 NOTE — Progress Notes (Addendum)
Neurology Progress Note  S: Feeling better than yesterday. Feels more awake. She can remember being at the grocery store yesterday, going out to her car, but had difficulty getting in the car. Does not remember hospital events yesterday. Denies HA, n/v, vision problems, weakness of an extremity. Her speech is still slow.   O: Current vital signs: BP 129/80   Pulse 70   Temp 98.3 F (36.8 C)   Resp 19   Ht 5' (1.524 m)   Wt 64.2 kg   SpO2 96%   BMI 27.64 kg/m  Vital signs in last 24 hours: Temp:  [97.5 F (36.4 C)-98.3 F (36.8 C)] 98.3 F (36.8 C) (09/04 0853) Pulse Rate:  [70-104] 70 (09/04 0853) Resp:  [12-23] 19 (09/04 0434) BP: (129-181)/(80-135) 129/80 (09/04 0853) SpO2:  [93 %-100 %] 96 % (09/04 0853) Weight:  [64.2 kg] 64.2 kg (09/03 1200)  GENERAL: Chronically ill appearing female. Awake, alert in NAD. HEENT: Normocephalic and atraumatic. LUNGS: Normal respiratory effort.  Ext: warm. Psych: calm cooperative. Light affect.   NEURO:  Mental Status: Alert and oriented to name, age, place, city, state, month. Disoriented to date and year. Knows why she is here. Follows commands.   Speech/Language: Speech is slow and dysarthric, but she is edentulous. Naming, repetition, fluency, and comprehension intact.  Cranial Nerves:  II: PERRL. Visual fields full.  III, IV, VI: EOMI. Eyelids elevate symmetrically.  V: Sensation is intact to light touch and symmetrical to face.  VII: Smile is symmetrical.   VIII: hearing intact to voice. IX, X: Palate elevates symmetrically. Phonation is normal.  BS:[id number] shrug 5/5. XII: tongue is midline without fasciculations. Motor:  5/5 strength to all muscle groups.  Tone: is normal and bulk is normal. Sensation- Intact to light touch bilaterally. Extinction absent to DSS.    Coordination: FNF slowed bilaterally and she has difficulty understanding the directions. No drift.  DTRs:  2+ throughout.  Gait- deferred. PT just worked  with her and she went to bathroom with use of walker today and stayed up in chair.   Medications  Current Facility-Administered Medications:    0.9 %  sodium chloride infusion, , Intravenous, Continuous, Kirby-Graham, Beather Arbour, NP, Last Rate: 125 mL/hr at 12/06/20 0120, New Bag at 12/06/20 0120   acyclovir (ZOVIRAX) 640 mg in dextrose 5 % 100 mL IVPB, 10 mg/kg, Intravenous, Q12H, Gherghe, Costin M, MD   cyanocobalamin ((VITAMIN B-12)) injection 1,000 mcg, 1,000 mcg, Intramuscular, Daily, Kirby-Graham, Beather Arbour, NP, 1,000 mcg at 12/06/20 0916   enoxaparin (LOVENOX) injection 40 mg, 40 mg, Subcutaneous, Q24H, Smith, Rondell A, MD, 40 mg at 12/05/20 1725   hydrALAZINE (APRESOLINE) injection 10 mg, 10 mg, Intravenous, Q4H PRN, Katrinka Blazing, Rondell A, MD   levothyroxine (SYNTHROID, LEVOTHROID) injection 50 mcg, 50 mcg, Intravenous, Daily, Leatha Gilding, MD, 50 mcg at 12/06/20 0916   ondansetron (ZOFRAN) tablet 4 mg, 4 mg, Oral, Q6H PRN **OR** ondansetron (ZOFRAN) injection 4 mg, 4 mg, Intravenous, Q6H PRN, Smith, Rondell A, MD   sodium chloride flush (NS) 0.9 % injection 3 mL, 3 mL, Intravenous, Q12H, Smith, Rondell A, MD, 3 mL at 12/06/20 0917   thiamine 500mg  in normal saline (25ml) IVPB, 500 mg, Intravenous, Q8H, Last Rate: 100 mL/hr at 12/06/20 0518, 500 mg at 12/06/20 0518 **FOLLOWED BY** [START ON 12/07/2020] thiamine (B-1) 250 mg in sodium chloride 0.9 % 50 mL IVPB, 250 mg, Intravenous, Daily **FOLLOWED BY** [START ON 12/13/2020] thiamine (B-1) injection 100 mg, 100 mg, Intravenous, Daily,  Leda Gauze, NP  Pertinent Labs TSH 41.462.  B12 206.  LDL 327. Na 127. UDS neg. ETOH < 10.   CT Head 12/05/20.  No evidence of acute intracranial abnormality.  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  Background mild chronic small vessel ischemic changes within the cerebral white matter.  Assessment: 75 yo female found sleeping in her car by staff at [employer] exhibiting  stroke symptoms. Code stroke with negative for acute CTH. Given her altered mental status, meningitis is on the differential. LP has not yielded results indicative of meningitis, but will leave Acyclovir on until HSV results negative. Workup has revealed severe hypothyroidism and several metabolic derangements which are likely the etiology of event. Should improve but will take time.   Impression: -acute toxic/metabolic encephalopathy. -severe hypothyroidism.  -HLD with very high LDL.   Recommendations/Plan:  -start high intensity statin. (Atorvastatin 80mg  po qd).  -Delirium precautions.  -Avoid medications on the Beer's list for the elderly.  -Continue to correct toxic/infectious/metabolic derangements as you are doing.  -Replete Vit B12 (206). Cyan IM qd x 5.  -Continue high dose Thiamine as ordered and send home on 100mg  po qd.  - Primary team please continue acyclovir until HSV PCR on CSF comes back negative, then can d/c -Agree with PT recommendation of SNF.  -Neurology will be available for questions.   Pt seen by , MSN, APN-BC/Nurse Practitioner/Neuro  Pager:   Neurology Attending Attestation   I discussed the plan with Ms. Jimmye Norman NP and Dr. [phone] TRH. Above note has been edited by me to reflect my findings and recommendations. Suspect encephalopathy 2/2 hypothyroidism, agree with synthroid. I would leave her on acyclovir until her HSV comes back negative, then can d/c. Neurology will sign off but please re-engage if additional neurologic questions arise.   Tereso Newcomer, MD [facility] [phone]   If 7pm- 7am, please page neurology on call as listed in AMION.

## 2020-12-06 NOTE — Evaluation (Addendum)
Clinical/Bedside Swallow Evaluation Patient Details  Name: Jane Navarro MRN: [id number] Date of Birth: 04-01-1946  Today's Date: 12/06/2020 Time: SLP Start Time (ACUTE ONLY): 0933 SLP Stop Time (ACUTE ONLY): 0945 SLP Time Calculation (min) (ACUTE ONLY): 12 min  Past Medical History:  Past Medical History:  Diagnosis Date   Anemia    Tobacco abuse, in remission    Past Surgical History:  Past Surgical History:  Procedure Laterality Date   TOTAL ABDOMINAL HYSTERECTOMY     HPI:  Pt is a 75 y.o. female who presented with AMS. 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 PMH: anemia and remote history of tobacco abuse.   Assessment / Plan / Recommendation Clinical Impression  Pt was seen for bedside swallow evaluation with her family present for part of the evaluation. All parties denied the pt having any history of dysphagia, but reported that she eats softer foods due to her teeth. Oral mechanism exam was limited due to pt's difficulty following some commands; however, oral motor strength and ROM appeared grossly WFL. Dentition was reduced with maxillary dentures and reduced mandibular teeth. She tolerated all solids and liquids without s/sx of aspiration. Mastication was prolonged and pt was noted to fall asleep during mastication of more advanced solids. Oral clearance was adequate. A dysphagia 2 diet with thin liquids is recommended at this time. SLP will follow pt to assess diet tolerance and for diet advancement as clinically indicated. SLP Visit Diagnosis: Dysphagia, unspecified (R13.10)    Aspiration Risk  Mild aspiration risk    Diet Recommendation Dysphagia 2 (Fine chop);Thin liquid   Liquid Administration via: Cup;Straw Medication Administration: Crushed with puree Supervision: Staff to assist with self feeding Compensations: Slow rate;Small sips/bites;Minimize  environmental distractions Postural Changes: Seated upright at 90 degrees    Other  Recommendations Oral Care Recommendations: Oral care BID   Follow up Recommendations  (TBD)      Frequency and Duration min 2x/week  1 week       Prognosis Prognosis for Safe Diet Advancement: Good Barriers to Reach Goals: Cognitive deficits      Swallow Study   General Date of Onset: 12/05/20 HPI: Pt is a 75 y.o. female who presented with AMS. 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 PMH: anemia and remote history of tobacco abuse. Type of Study: Bedside Swallow Evaluation Previous Swallow Assessment: none Diet Prior to this Study: NPO Temperature Spikes Noted: No Respiratory Status: Room air History of Recent Intubation: No Behavior/Cognition: Cooperative;Pleasant mood;Confused;Lethargic/Drowsy Oral Cavity Assessment: Within Functional Limits Oral Care Completed by SLP: No Oral Cavity - Dentition: Dentures, top;Missing dentition Vision: Functional for self-feeding Self-Feeding Abilities: Able to feed self Patient Positioning: Upright in bed Baseline Vocal Quality: Low vocal intensity Volitional Cough: Strong Volitional Swallow: Able to elicit    Oral/Motor/Sensory Function Overall Oral Motor/Sensory Function: Within functional limits   Ice Chips Ice chips: Within functional limits Presentation: Spoon   Thin Liquid Thin Liquid: Within functional limits Presentation: Straw    Nectar Thick Nectar Thick Liquid: Not tested   Honey Thick Honey Thick Liquid: Not tested   Puree Puree: Within functional limits Presentation: Spoon   Solid     Solid: Impaired Oral Phase Impairments: Impaired mastication     Vivica Dobosz I. Vear Clock, MS, CCC-SLP Acute Rehabilitation Services Office number [phone] Pager [phone]  Scheryl Marten 12/06/2020,10:03 AM

## 2020-12-06 NOTE — Plan of Care (Signed)
  Problem: Safety: Goal: Non-violent Restraint(s) Outcome: Progressing   Problem: Education: Goal: Knowledge of disease or condition will improve Outcome: Progressing Goal: Knowledge of secondary prevention will improve Outcome: Progressing Goal: Knowledge of patient specific risk factors addressed and post discharge goals established will improve Outcome: Progressing   Problem: Coping: Goal: Will verbalize positive feelings about self Outcome: Progressing Goal: Will identify appropriate support needs Outcome: Progressing   Problem: Health Behavior/Discharge Planning: Goal: Ability to manage health-related needs will improve Outcome: Progressing   Problem: Self-Care: Goal: Ability to participate in self-care as condition permits will improve Outcome: Progressing Goal: Verbalization of feelings and concerns over difficulty with self-care will improve Outcome: Progressing Goal: Ability to communicate needs accurately will improve Outcome: Progressing   Problem: Nutrition: Goal: Risk of aspiration will decrease Outcome: Progressing Goal: Dietary intake will improve Outcome: Progressing

## 2020-12-06 NOTE — Progress Notes (Signed)
Brief Neurology Update  Called by Dr. 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 Cardiology was consulted and felt it was atypical for thrombus or vegetation and location unusual for myxoma or papillary fibroelastoma.  They recommended heparin gtt given that its mobility suggested significant embolic potential.   Neurology agrees with heparin gtt while patient is awaiting cardiac MRI for further characterization, and anticoagulation after if indicated. 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 She has a nonfocal examination therefore a large acute ischemic stroke is not on the differential. Her encephalopathy is favored to be 2/2 severe hypothyroidism. Even if she did have a small acute infarct (which is possible given cardiac embolic source; cannot be ruled out as she has not yet been able to tolerate MRI brain), the importance of anticoagulation in this case for prevention of subsequent embolic events outweighs the risk of a hemorrhagic conversion of a possible small ischemic stroke.  Please contact neurology if any additional questions arise.  Bing Neighbors, MD [facility] [phone]  If 7pm- 7am, please page neurology on call as listed in AMION.

## 2020-12-07 DIAGNOSIS — E785 Hyperlipidemia, unspecified: Secondary | ICD-10-CM

## 2020-12-07 DIAGNOSIS — I5189 Other ill-defined heart diseases: Secondary | ICD-10-CM | POA: Diagnosis not present

## 2020-12-07 DIAGNOSIS — G9341 Metabolic encephalopathy: Secondary | ICD-10-CM | POA: Diagnosis not present

## 2020-12-07 LAB — COMPREHENSIVE METABOLIC PANEL
ALT: 26 U/L (ref 0–44)
AST: 109 U/L — ABNORMAL HIGH (ref 15–41)
Albumin: 3.9 g/dL (ref 3.5–5.0)
Alkaline Phosphatase: 76 U/L (ref 38–126)
Anion gap: 9 (ref 5–15)
BUN: 13 mg/dL (ref 8–23)
CO2: 23 mmol/L (ref 22–32)
Calcium: 9.5 mg/dL (ref 8.9–10.3)
Chloride: 96 mmol/L — ABNORMAL LOW (ref 98–111)
Creatinine, Ser: 1.15 mg/dL — ABNORMAL HIGH (ref 0.44–1.00)
GFR, Estimated: 50 mL/min — ABNORMAL LOW (ref >60)
Glucose, Bld: 83 mg/dL (ref 70–99)
Potassium: 3.7 mmol/L (ref 3.5–5.1)
Sodium: 128 mmol/L — ABNORMAL LOW (ref 135–145)
Total Bilirubin: 0.6 mg/dL (ref 0.3–1.2)
Total Protein: 7.5 g/dL (ref 6.5–8.1)

## 2020-12-07 LAB — IRON AND TIBC
Iron: 45 ug/dL (ref 28–170)
Saturation Ratios: 14 % (ref 10.4–31.8)
TIBC: 314 ug/dL (ref 250–450)
UIBC: 269 ug/dL

## 2020-12-07 LAB — CBC
HCT: 34.4 % — ABNORMAL LOW (ref 36.0–46.0)
Hemoglobin: 11.8 g/dL — ABNORMAL LOW (ref 12.0–15.0)
MCH: 32 pg (ref 26.0–34.0)
MCHC: 34.3 g/dL (ref 30.0–36.0)
MCV: 93.2 fL (ref 80.0–100.0)
Platelets: 174 10*3/uL (ref 150–400)
RBC: 3.69 MIL/uL — ABNORMAL LOW (ref 3.87–5.11)
RDW: 13.5 % (ref 11.5–15.5)
WBC: 8.6 10*3/uL (ref 4.0–10.5)
nRBC: 0 % (ref 0.0–0.2)

## 2020-12-07 LAB — FOLATE: Folate: 7.8 ng/mL (ref >5.9)

## 2020-12-07 LAB — MAGNESIUM: Magnesium: 2.2 mg/dL (ref 1.7–2.4)

## 2020-12-07 LAB — HEPARIN LEVEL (UNFRACTIONATED)
Heparin Unfractionated: 0.65 IU/mL (ref 0.30–0.70)
Heparin Unfractionated: 0.55 IU/mL (ref 0.30–0.70)

## 2020-12-07 LAB — PHOSPHORUS: Phosphorus: 3 mg/dL (ref 2.5–4.6)

## 2020-12-07 MED ORDER — SODIUM CHLORIDE 0.9 % IV SOLN
INTRAVENOUS | Status: DC
  Administered 2020-12-08: 1000 mL via INTRAVENOUS

## 2020-12-07 NOTE — Progress Notes (Signed)
ANTICOAGULATION CONSULT NOTE - Follow Up Consult  Pharmacy Consult for heparin Indication:  LV thrombus  in setting of possible stroke  Labs: Recent Labs    12/05/20 1247 12/05/20 1252 12/06/20 0138 12/07/20 0222 12/07/20 1156  HGB 10.6* 11.2* 11.8* 11.8*  --   HCT 32.6* 33.0* 34.1* 34.4*  --   PLT 201  --  174 174  --   APTT 30  --   --   --   --   LABPROT 13.4  --   --   --   --   INR 1.0  --   --   --   --   HEPARINUNFRC  --   --   --  0.65 0.55  CREATININE 1.22* 1.00 0.74 1.15*  --      Assessment: 75yo female presenting to [facility] after being found unconscious in her car. 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 Patient does have a h/o CVA, however CT of the head shows no acute changes. Pharmacy has been consulted for heparin dosing.   Heparin level 9/5 supratherapeutic (goal 0.3-0.5) at 0.55 with heparin running at 650 units/h. H/H stable, PLT WNL. No signs of bleeding noted per RN.   Goal of Therapy:  Heparin level 0.3-0.5 units/ml   Plan:  Decrease Heparin to 550 units/h F/U 8h heparin level  F/U daily Heparin level, CBC, monitor for signs and symptoms of bleeding F/U Brain MRI   Jani Gravel, PharmD PGY-1 Acute Care Resident  12/07/2020 3:00 PM

## 2020-12-07 NOTE — Progress Notes (Signed)
Progress Note  Patient Name: Jane Navarro Date of Encounter: 12/07/2020  [facility] Cardiologist: None   Subjective   Somnolent, not following commands or answering questions  Inpatient Medications    Scheduled Meds:  atorvastatin  80 mg Oral Daily   cyanocobalamin  1,000 mcg Intramuscular Daily   levothyroxine  50 mcg Intravenous Daily   sodium chloride flush  3 mL Intravenous Q12H   [START ON 12/13/2020] thiamine injection  100 mg Intravenous Daily   Continuous Infusions:  acyclovir 640 mg (12/07/20 0409)   heparin 650 Units/hr (12/07/20 0409)   thiamine injection     PRN Meds: haloperidol lactate, hydrALAZINE, LORazepam, ondansetron **OR** ondansetron (ZOFRAN) IV   Vital Signs    Vitals:   12/06/20 2347 12/07/20 0247 12/07/20 0350 12/07/20 0801  BP: (!) 157/97 131/71 125/83 (!) 171/92  Pulse: 78 87 62 74  Resp: 18 19 18 17   Temp: 98.6 F (37 C) 98.1 F (36.7 C) 98.6 F (37 C) 98.2 F (36.8 C)  TempSrc: Axillary Oral Axillary   SpO2: 99% 97% 100% 99%  Weight:      Height:       No intake or output data in the 24 hours ending 12/07/20 0935 Last 3 Weights 12/05/2020 12/16/2014  Weight (lbs) 141 lb 8.6 oz 158 lb  Weight (kg) 64.2 kg 71.668 kg      Telemetry    NSR 60-80s - Personally Reviewed  ECG    No new ECG - Personally Reviewed  Physical Exam   GEN: Somnolent but arousable  Neck: No JVD Cardiac: RRR, 2/6 systolic murmur Respiratory: Clear to auscultation bilaterally in anterior fields GI: Soft, nontender, non-distended  MS: No edema; No deformity. Neuro:  Not answering questions or following commands Psych: Unable to assess  Labs    High Sensitivity Troponin:  No results for input(s): TROPONINIHS in the last 720 hours.    Chemistry Recent Labs  Lab 12/05/20 1247 12/05/20 1252 12/06/20 0138 12/07/20 0222  NA 131* 132* 127* 128*  K 3.9 3.9 3.8 3.7  CL 96* 99 96* 96*  CO2 23  --  22 23  GLUCOSE 113* 109* 111* 83  BUN 14 15 11 13    CREATININE 1.22* 1.00 0.74 1.15*  CALCIUM 9.5  --  9.3 9.5  PROT 7.2  --  7.5 7.5  ALBUMIN 4.0  --  4.1 3.9  AST 68*  --  90* 109*  ALT 22  --  26 26  ALKPHOS 66  --  72 76  BILITOT 0.4  --  0.5 0.6  GFRNONAA 47*  --  >60 50*  ANIONGAP 12  --  9 9     Hematology Recent Labs  Lab 12/05/20 1247 12/05/20 1252 12/06/20 0138 12/07/20 0222  WBC 5.8  --  8.1 8.6  RBC 3.35*  --  3.66* 3.69*  HGB 10.6* 11.2* 11.8* 11.8*  HCT 32.6* 33.0* 34.1* 34.4*  MCV 97.3  --  93.2 93.2  MCH 31.6  --  32.2 32.0  MCHC 32.5  --  34.6 34.3  RDW 13.8  --  13.3 13.5  PLT 201  --  174 174    BNPNo results for input(s): BNP, PROBNP in the last 168 hours.   DDimer No results for input(s): DDIMER in the last 168 hours.   Radiology    ECHOCARDIOGRAM COMPLETE  Result Date: 12/06/2020    ECHOCARDIOGRAM REPORT   Patient Name:   Jane PuffJUDY Michael Date of Exam: 12/06/2020 Medical  Rec #:  [id number]    Height:       60.0 in Accession #:    [id number]   Weight:       141.5 lb Date of Birth:  October 09, 1945    BSA:          1.612 m Patient Age:    74 years     BP:           150/92 mmHg Patient Gender: F            HR:           72 bpm. Exam Location:  Inpatient Procedure: 2D Echo, Cardiac Doppler and Color Doppler Indications:    Stroke I63.9  History:        Patient has no prior history of Echocardiogram examinations.                 Signs/Symptoms:Altered Mental Status; Risk Factors:Former                 Smoker. Hypertensive urgency. Hypothyroidism. Passed out in                 parking lot after shopping.  Sonographer:    Roosvelt Maser RDCS Referring Phys: [id number] [address] [facility] IMPRESSIONS  1. 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 It measures approximately 13 mm in maximum length and 5 mm in width, with a relatively narrow attachment base. Left ventricular ejection fraction, by estimation, is  60 to 65%. The left ventricle has normal function. The left ventricle has no regional wall motion abnormalities. There is mild concentric left ventricular hypertrophy. Left ventricular diastolic parameters are consistent with Grade I diastolic dysfunction (impaired relaxation). Elevated left atrial pressure.  2. Right ventricular systolic function is normal. The right ventricular size is normal.  3. The mitral valve is normal in structure. No evidence of mitral valve regurgitation. No evidence of mitral stenosis.  4. The aortic valve is normal in structure. Aortic valve regurgitation is not visualized. No aortic stenosis is present.  5. The inferior vena cava is normal in size with greater than 50% respiratory variability, suggesting right atrial pressure of 3 mmHg. Conclusion(s)/Recommendation(s): Discussed with primary team. The left ventricular mass is highly atypical for either thrombus or vegetation and the location is unusual for myxoma or papillary fibroelastoma. Its mobility suggests significant embolic potential. While TEE would offer better resolution, it may not add diagnostic value. Consider cardiac MRI. 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 It measures approximately 13 mm in maximum length and 5 mm in width, with a relatively narrow attachment base. Left ventricular ejection fraction, by estimation, is 60 to 65%. The left ventricle has normal function. The left ventricle has no regional wall motion abnormalities. The left ventricular internal cavity size was normal in size. There is mild concentric left ventricular hypertrophy. Left ventricular diastolic parameters are consistent with Grade I diastolic dysfunction (impaired relaxation). Elevated left atrial pressure. Right Ventricle: The right ventricular size is normal. No increase in right  ventricular wall thickness. Right ventricular systolic function is normal. Left Atrium: Left atrial size was normal in size. Right Atrium: Right atrial size was normal in size. Pericardium: There is no evidence of pericardial effusion. Mitral Valve: The mitral  valve is normal in structure. No evidence of mitral valve regurgitation. No evidence of mitral valve stenosis. Tricuspid Valve: The tricuspid valve is normal in structure. Tricuspid valve regurgitation is not demonstrated. No evidence of tricuspid stenosis. Aortic Valve: The aortic valve is normal in structure. Aortic valve regurgitation is not visualized. No aortic stenosis is present. Aortic valve mean gradient measures 5.0 mmHg. Aortic valve peak gradient measures 8.1 mmHg. Aortic valve area, by VTI measures 1.71 cm. Pulmonic Valve: The pulmonic valve was normal in structure. Pulmonic valve regurgitation is not visualized. No evidence of pulmonic stenosis. Aorta: The aortic root is normal in size and structure. Venous: The inferior vena cava is normal in size with greater than 50% respiratory variability, suggesting right atrial pressure of 3 mmHg. IAS/Shunts: No atrial level shunt detected by color flow Doppler.  LEFT VENTRICLE PLAX 2D LVIDd:         3.60 cm LVIDs:         2.60 cm  Diastology LV PW:         1.20 cm  LV e' medial:    3.97 cm/s LV IVS:        1.50 cm  LV E/e' medial:  16.8 LVOT diam:     1.70 cm  LV e' lateral:   5.35 cm/s LV SV:         49       LV E/e' lateral: 12.5 LV SV Index:   30 LVOT Area:     2.27 cm  RIGHT VENTRICLE RV Basal diam:  3.00 cm LEFT ATRIUM             Index       RIGHT ATRIUM           Index LA diam:        2.10 cm 1.30 cm/m  RA Area:     10.60 cm LA Vol (A2C):   36.7 ml 22.77 ml/m RA Volume:   21.10 ml  13.09 ml/m LA Vol (A4C):   21.6 ml 13.40 ml/m LA Biplane Vol: 30.1 ml 18.68 ml/m  AORTIC VALVE AV Area (Vmax):    1.65 cm AV Area (Vmean):   1.48 cm AV Area (VTI):     1.71 cm AV Vmax:           142.00 cm/s AV  Vmean:          105.000 cm/s AV VTI:            0.285 m AV Peak Grad:      8.1 mmHg AV Mean Grad:      5.0 mmHg LVOT Vmax:         103.00 cm/s LVOT Vmean:        68.300 cm/s LVOT VTI:          0.215 m LVOT/AV VTI ratio: 0.75  AORTA Ao Root diam: 2.40 cm MITRAL VALVE MV Area (PHT): 2.29 cm     SHUNTS MV Decel Time: 332 msec     Systemic VTI:  0.22 m MV E velocity: 66.80 cm/s   Systemic Diam: 1.70 cm MV A velocity: 109.00 cm/s MV E/A ratio:  0.61 Mihai Croitoru MD Electronically signed by Thurmon Fair MD Signature Date/Time: 12/06/2020/2:42:42 PM    Final    US THYROID  Result Date: 12/06/2020 CLINICAL DATA:  Goiter.  Elevated TSH EXAM: THYROID ULTRASOUND TECHNIQUE: Ultrasound examination of the thyroid gland and adjacent soft tissues was performed. COMPARISON:  None. FINDINGS: Parenchymal Echotexture: None identified Isthmus:  Not visualized Right lobe: Not visualized Left lobe: Not visualized _________________________________________________________ Estimated total number of nodules >/= 1 cm: 0 Number of spongiform nodules >/=  2 cm not described below (TR1): 0 Number of mixed cystic and solid nodules >/= 1.5 cm not described below (TR2): 0 _________________________________________________________ No normal thyroid parenchyma was localized. No regional cervical adenopathy. IMPRESSION: Nonvisualization of thyroid. In the absence of previous thyroidectomy, this would suggest marked parenchymal atrophy. The above is in keeping with the ACR TI-RADS recommendations - J Am Coll Radiol 2017;14:587-595. Electronically Signed   By: Corlis Leak M.D.   On: 12/06/2020 15:53   CT HEAD CODE STROKE WO CONTRAST  Result Date: 12/05/2020 CLINICAL DATA:  Code stroke. Neuro deficit, acute, stroke suspected. EXAM: CT HEAD WITHOUT CONTRAST TECHNIQUE: Contiguous axial images were obtained from the base of the skull through the vertex without intravenous contrast. COMPARISON:  Report from head CT 11/21/1998 (images currently  unavailable). FINDINGS: Brain: Cerebral volume is normal for age. 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 Background mild multifocal T2/FLAIR hyperintensity within the cerebral white matter, nonspecific but compatible with chronic small vessel ischemic disease. There is no acute intracranial hemorrhage. No demarcated cortical infarct. No extra-axial fluid collection. No evidence of an intracranial mass. No midline shift. Vascular: No hyperdense vessel.  Atherosclerotic calcifications. Skull: Normal. Negative for fracture or focal lesion. Sinuses/Orbits: Visualized orbits show no acute finding. No significant paranasal sinus disease. ASPECTS [facility] Center Stroke Program Early CT Score) - Ganglionic level infarction (caudate, lentiform nuclei, internal capsule, insula, M1-M3 cortex): 7 - Supraganglionic infarction (M4-M6 cortex): 3 Total score (0-10 with 10 being normal): 10 (when discounting chronic infarcts). These results were communicated to Dr. Selina Cooley At 1:18 pmon 9/3/2022by text page via the [facility] messaging system. IMPRESSION: No evidence of acute intracranial abnormality. 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 Background mild chronic small vessel ischemic changes within the cerebral white matter. Electronically Signed   By: Jackey Loge D.O.   On: 12/05/2020 13:18    Cardiac Studies   TTE 12/06/20:  1. 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 It measures approximately 13 mm in maximum  length and 5 mm in width, with a  relatively narrow attachment base. Left ventricular ejection fraction, by  estimation, is 60 to 65%. The left ventricle has normal function. The left  ventricle has no regional wall motion abnormalities. There is mild  concentric left ventricular  hypertrophy. Left ventricular diastolic  parameters are consistent with  Grade I diastolic dysfunction (impaired relaxation). Elevated left atrial  pressure.   2. Right ventricular systolic function is normal. The right ventricular  size is normal.   3. The mitral valve is normal in structure. No evidence of mitral valve  regurgitation. No evidence of mitral stenosis.   4. The aortic valve is normal in structure. Aortic valve regurgitation is  not visualized. No aortic stenosis is present.   5. The inferior vena cava is normal in size with greater than 50%  respiratory variability, suggesting right atrial pressure of 3 mmHg.   Conclusion(s)/Recommendation(s): Discussed with primary team. The left  ventricular mass is highly atypical for either thrombus or vegetation and  the location is unusual for myxoma or papillary fibroelastoma. Its  mobility suggests significant embolic  potential. While TEE would offer better resolution, it may not add  diagnostic value. Consider cardiac MRI.   Patient Profile     75 y.o. 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  Plan was to anticoagulate for few days and repeat echocardiogram.  If no change, plan for cardiac MRI.  I think this sounds like a good plan, as she is not currently able to undergo cardiac MRI due to her mental status, as she would be unable to cooperate with the breath holds needed for the exam.  Brain MRI attempted last night but was canceled due to agitation.   Altered mental status: Initial CT of the head showed no acute  process but chronic appearing lacunar infarct.  Followed by neurology service.  Brain MRI attempted but unable to complete due to agitation. Felt to be driven by severe hypothyroidism.   Severe hypothyroidism: TSH 41.462.   Started on IV levothyroxine.  Thyroid US showed marked thyroid atrophy.   History of CVA: CT of the head shows no acute changes, however did reveal chronic appearing lacunar infarct in the right caudate nucleus   Hyponatremia: May be driven by hypothyroidism.  Hyperlipidemia: marked elevation in LDL, 327 on 9/4.  Started on atorvastatin 80 mg daily, will likely need lipid clinic referral as outpatient.  For questions or updates, please contact [facility] Please consult [email or web address] for contact info under        Signed, Little Ishikawa, MD  12/07/2020, 9:35 AM

## 2020-12-07 NOTE — Progress Notes (Signed)
Physical Therapy Treatment Patient Details Name: Jane Navarro MRN: [id number] DOB: 1945-12-09 Today's Date: 12/07/2020    History of Present Illness Jane Navarro is a 75 yo female brought to ED by EMS 9/3 from a [employer] parking lot--found patient with slurred speech, left sided weakness, and confusion.CTH showed no evidence of acute intracranial abnormality, but chronic appearing lacunar infarct within the right caudate nucleus and chronic small vessel ischemic changes. 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 On 9/4 pt with agitation and given Ativan x 3. PMH - kidney stone and rest unknown because she has not been to a provider in years.    PT Comments    Pt more lethargic today and with decr mobility. Will have to see how pt does when more alert to see if she can progress with mobility. At this time continue to recommend SNF for further rehab.    Follow Up Recommendations  SNF     Equipment Recommendations  Rolling walker with 5" wheels;3in1 (PT)    Recommendations for Other Services       Precautions / Restrictions Precautions Precautions: Fall Required Braces or Orthoses: Other Brace Other Brace: Pt wears neoprene type sleeve on lt knee due to pain    Mobility  Bed Mobility Overal bed mobility: Needs Assistance Bed Mobility: Supine to Sit;Sit to Supine     Supine to sit: Mod assist;HOB elevated Sit to supine: Mod assist   General bed mobility comments: Assist to bring legs off of bed, elevate trunk into sitting and bring hips to EOB. Assist to lower trunk and bring legs back up into bed returning to supine.    Transfers Overall transfer level: Needs assistance Equipment used: Rolling walker (2 wheeled) Transfers: Sit to/from [employer] Sit to Stand: Mod assist;+2 safety/equipment Stand pivot transfers: Mod assist;+2 safety/equipment       General transfer comment: assist to  bring hips up and for balance. Verbal/tactile cues for technique. Bed to bsc with small pivotal steps. Lt lateral lean  Ambulation/Gait Ambulation/Gait assistance: Mod assist;+2 safety/equipment Gait Distance (Feet): 2 Feet Assistive device: Rolling walker (2 wheeled) Gait Pattern/deviations: Step-to pattern;Decreased step length - left;Decreased step length - right;Shuffle;Trunk flexed Gait velocity: decr Gait velocity interpretation: <1.31 ft/sec, indicative of household ambulator General Gait Details: Asst for balance and support. Took several steps from [facility] up side of bed prior to returning to sitting   Stairs             Wheelchair Mobility    Modified Rankin (Stroke Patients Only)       Balance Overall balance assessment: Needs assistance Sitting-balance support: Single extremity supported;Feet supported Sitting balance-Leahy Scale: Poor Sitting balance - Comments: Min to min guard assist for static standing. Lt lateral lean which worsened with fatigue   Standing balance support: Bilateral upper extremity supported Standing balance-Leahy Scale: Poor Standing balance comment: walker and min assist for static standing                            Cognition Arousal/Alertness: Lethargic Behavior During Therapy: Flat affect Overall Cognitive Status: Difficult to assess Area of Impairment: Following commands;Safety/judgement;Awareness;Attention;Memory                     Memory: Decreased short-term memory Following Commands: Follows one step commands inconsistently;Follows one step commands with increased time Safety/Judgement: Decreased awareness of safety;Decreased awareness of deficits Awareness:  Intellectual Problem Solving: Slow processing;Requires verbal cues;Requires tactile cues;Difficulty sequencing General Comments: Able to arouse pt with verbal/tactile stimuli.      Exercises      General Comments        Pertinent Vitals/Pain  Pain Assessment: Faces Faces Pain Scale: No hurt    Home Living                      Prior Function            PT Goals (current goals can now be found in the care plan section) Progress towards PT goals: Not progressing toward goals - comment    Frequency    Min 3X/week      PT Plan Current plan remains appropriate    Co-evaluation              AM-PAC PT "6 Clicks" Mobility   Outcome Measure  Help needed turning from your back to your side while in a flat bed without using bedrails?: A Lot Help needed moving from lying on your back to sitting on the side of a flat bed without using bedrails?: A Lot Help needed moving to and from a bed to a chair (including a wheelchair)?: Total Help needed standing up from a chair using your arms (e.g., wheelchair or bedside chair)?: A Lot Help needed to walk in hospital room?: Total Help needed climbing 3-5 steps with a railing? : Total 6 Click Score: 9    End of Session Equipment Utilized During Treatment: Gait belt Activity Tolerance: Patient limited by fatigue;Patient limited by lethargy Patient left: in bed;with call bell/phone within reach;with bed alarm set;with family/visitor present Nurse Communication: Other (comment) (purewick out) PT Visit Diagnosis: Unsteadiness on feet (R26.81);Other abnormalities of gait and mobility (R26.89)     Time: 2297-9892 PT Time Calculation (min) (ACUTE ONLY): 30 min  Charges:  $Therapeutic Activity: 23-37 mins                     [facility] PT Acute Rehabilitation Services Pager [phone] Office [phone]    Angelina Ok [facility] 12/07/2020, 4:58 PM

## 2020-12-07 NOTE — Progress Notes (Signed)
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  Goal of Therapy:  Heparin level 0.3-0.5 units/ml   Plan:  Will decrease heparin infusion by 2-3 units/kg/hr to 650 units/hr and check level in 8 hours.    Vernard Gambles, PharmD, BCPS  12/07/2020,3:47 AM

## 2020-12-07 NOTE — Progress Notes (Addendum)
  Speech Language Pathology Treatment: Dysphagia  Patient Details Name: Jane Navarro MRN: [id number] DOB: Jul 18, 1945 Today's Date: 12/07/2020 Time: 3299-2426 SLP Time Calculation (min) (ACUTE ONLY): 9 min  Assessment / Plan / Recommendation Clinical Impression  RN reports pt given dose of ativan overnight and very lethargic this date, but that she has been easily awakened. Attempted trials of POs after repositioning pt upright in bed. She verbalized desire for water and she consumed via straw exhibiting multiple swallows and overt cough x1 across limited trials. Despite her willingness to consumed upgraded solids, session was terminated due to increasing lethargy and inconsistency in following commands. Pt to continue on current diet with SLP to f/u.   HPI HPI: Pt is a 75 y.o. female who presented with AMS. 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 PMH: anemia and remote history of tobacco abuse.      SLP Plan  Continue with current plan of care       Recommendations  Diet recommendations: Dysphagia 2 (fine chop);Thin liquid Liquids provided via: Straw;Cup Medication Administration: Crushed with puree Supervision: Staff to assist with self feeding;Full supervision/cueing for compensatory strategies Compensations: Slow rate;Small sips/bites;Minimize environmental distractions Postural Changes and/or Swallow Maneuvers: Seated upright 90 degrees                Oral Care Recommendations: Oral care BID Follow up Recommendations: Other (comment) (TBD) SLP Visit Diagnosis: Dysphagia, unspecified (R13.10) Plan: Continue with current plan of care       GO               Avie Echevaria, MA, CCC-SLP Acute Rehabilitation Services Office Number: [phone]  Paulette Blanch 12/07/2020, 1:42 PM

## 2020-12-07 NOTE — Progress Notes (Signed)
PROGRESS NOTE  Jane Navarro TMY:[id number] DOB: Apr 18, 1945 DOA: 12/05/2020 PCP: Brooke Bonito, MD   LOS: 2 days   Brief Narrative / Interim history: 75 year old female with significant history of anemia, tobacco use in the past, comes into the hospital after being found altered in her car in the [employer] driving lot.  She apparently went in, did grocery shopping and then got back into her car and passed out.  EMS was called and she was brought to the hospital.  Patient does not have a PCP and does not seek regular care and is not on any medications.  Son apparently talked to her over the phone 6 days prior to admission and she seems to be in her normal state of health.  She was admitted as a code stroke.  On admission she underwent an LP and was placed on empiric acyclovir  Subjective / 24h Interval events: Quite confused and combative overnight, she appears Calmer this morning but still sleepy  Assessment & Plan: Principal Problem Severe hypothyroidism-work-up on admission revealed a significantly elevated TSH to 41.4.  Patient tells me she has a "borderline hypothyroidism" but cannot elaborate further.  Family unaware about any thyroid issues. -Thyroid ultrasound was unable to visualize the thyroid, in the absence of previous thyroidectomy this would suggest marked parenchymal atrophy -Remains intermittently encephalopathic and continue IV Synthroid for now. -Repeat TSH in 3 to 4 weeks  Active Problems Acute metabolic encephalopathy-possibly main driver is untreated hypothyroidism.  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  Cardiology consulted.  Somewhat atypical for vegetation or thrombus or mass -Blood cultures were obtained -She has been placed on heparin at cardiology recommendations, and repeat echocardiogram in a few days and if not still present she will need a cardiac MRI.  Hyperlipidemia-her LDL is 327.  Continue statin  Probable essential hypertension-hypertensive at times but that is when she is agitated, normotensive otherwise.  Continue to monitor  Borderline B12 deficiency-started on supplementation  Chronic kidney disease stage IIIa-creatinine at her baseline  Hyponatremia-likely hypothyroidism induced.  Monitor.  Improving  Elevated LFTs-in a pattern that raises suspicion for EtOH use.  Son tells me that she drink in the past but he is unaware of her currently drinking   Scheduled Meds:  atorvastatin  80 mg Oral Daily   cyanocobalamin  1,000 mcg Intramuscular Daily   levothyroxine  50 mcg Intravenous Daily   sodium chloride flush  3 mL Intravenous Q12H   [START ON 12/13/2020] thiamine injection  100 mg Intravenous Daily   Continuous Infusions:  acyclovir 640 mg (12/07/20 0409)   heparin 650 Units/hr (12/07/20 0409)   thiamine injection     PRN Meds:.haloperidol lactate, hydrALAZINE, LORazepam, ondansetron **OR** ondansetron (ZOFRAN) IV  Diet Orders (From admission, onward)     Start     Ordered   12/06/20 0957  DIET DYS 2 Room service appropriate? Yes with Assist; Fluid consistency: Thin  Diet  effective now       Question Answer Comment  Room service appropriate? Yes with Assist   Fluid consistency: Thin      12/06/20 0956            DVT prophylaxis:      Code Status: Full Code  Family Communication: Son present at bedside  Status is: Inpatient  Remains  inpatient appropriate because:Altered mental status and Inpatient level of care appropriate due to severity of illness  Dispo: The patient is from: Home              Anticipated d/c is to: SNF              Patient currently is not medically stable to d/c.   Difficult to place patient No   Level of care: Telemetry Medical  Consultants:  none  Procedures:  none  Microbiology  none  Antimicrobials: none    Objective: Vitals:   12/06/20 2347 12/07/20 0247 12/07/20 0350 12/07/20 0801  BP: (!) 157/97 131/71 125/83 (!) 171/92  Pulse: 78 87 62 74  Resp: 18 19 18 17   Temp: 98.6 F (37 C) 98.1 F (36.7 C) 98.6 F (37 C) 98.2 F (36.8 C)  TempSrc: Axillary Oral Axillary   SpO2: 99% 97% 100% 99%  Weight:      Height:       No intake or output data in the 24 hours ending 12/07/20 1107  Filed Weights   12/05/20 1200  Weight: 64.2 kg    Examination:  Constitutional: Lethargic this morning Eyes: Anicteric ENMT: Moist mucous membranes Neck: normal, supple Respiratory: Lungs are clear bilaterally, no wheezing, no crackles, normal respiratory effort Cardiovascular: Regular rate and rhythm cardiovascular no peripheral edema Abdomen: Soft, NT, ND, bowel sounds positive Musculoskeletal: no clubbing / cyanosis.  Skin: no rashes appreciated Neurologic: Does not follow commands this morning but appears to move all 4 extremities and strength is equal   Data Reviewed: I have independently reviewed following labs and imaging studies   CBC: Recent Labs  Lab 12/05/20 1247 12/05/20 1252 12/06/20 0138 12/07/20 0222  WBC 5.8  --  8.1 8.6  NEUTROABS 3.9  --  7.1  --   HGB 10.6* 11.2* 11.8* 11.8*  HCT 32.6* 33.0* 34.1* 34.4*  MCV 97.3  --  93.2 93.2  PLT 201  --  174 174    Basic Metabolic Panel: Recent Labs  Lab 12/05/20 1247 12/05/20 1252 12/06/20 0138 12/07/20 0222  NA 131* 132* 127* 128*  K 3.9 3.9 3.8 3.7  CL 96* 99 96* 96*  CO2 23  --  22 23  GLUCOSE 113*  109* 111* 83  BUN 14 15 11 13   CREATININE 1.22* 1.00 0.74 1.15*  CALCIUM 9.5  --  9.3 9.5  MG  --   --   --  2.2  PHOS  --   --   --  3.0    Liver Function Tests: Recent Labs  Lab 12/05/20 1247 12/06/20 0138 12/07/20 0222  AST 68* 90* 109*  ALT 22 26 26   ALKPHOS 66 72 76  BILITOT 0.4 0.5 0.6  PROT 7.2 7.5 7.5  ALBUMIN 4.0 4.1 3.9    Coagulation Profile: Recent Labs  Lab 12/05/20 1247  INR 1.0    HbA1C: No results for input(s): HGBA1C in the last 72 hours. CBG: Recent Labs  Lab 12/05/20 1248  GLUCAP 110*     Recent Results (from the past 240 hour(s))  CSF culture  w Stat Gram Stain     Status: None (Preliminary result)   Collection Time: 12/05/20  3:50 PM   Specimen: CSF; Cerebrospinal Fluid  Result Value Ref Range Status   Specimen Description CSF  Final   Special Requests NONE  Final   Gram Stain   Final    WBC PRESENT, PREDOMINANTLY MONONUCLEAR NO ORGANISMS SEEN CYTOSPIN SMEAR    Culture   Final    NO GROWTH 2 DAYS Performed at [facility], [address], [place], [place] [address]    Report Status PENDING  Incomplete  SARS CORONAVIRUS 2 (TAT 6-24 HRS) Nasopharyngeal Nasopharyngeal Swab     Status: None   Collection Time: 12/05/20  6:47 PM   Specimen: Nasopharyngeal Swab  Result Value Ref Range Status   SARS Coronavirus 2 NEGATIVE NEGATIVE Final    Comment: (NOTE) SARS-CoV-2 target nucleic acids are NOT DETECTED.  The SARS-CoV-2 RNA is generally detectable in upper and lower respiratory specimens during the acute phase of infection. Negative results do not preclude SARS-CoV-2 infection, do not rule out co-infections with other pathogens, and should not be used as the sole basis for treatment or other patient management decisions. Negative results must be combined with clinical observations, patient history, and epidemiological information. The expected result is Negative.  Fact Sheet for  Patients: HairSlick.no  Fact Sheet for Healthcare Providers: quierodirigir.com  This test is not yet approved or cleared by the [place] FDA and  has been authorized for detection and/or diagnosis of SARS-CoV-2 by FDA under an Emergency Use Authorization (EUA). This EUA will remain  in effect (meaning this test can be used) for the duration of the COVID-19 declaration under Se ction 564(b)(1) of the Act, 21 U.S.C. section 360bbb-3(b)(1), unless the authorization is terminated or revoked sooner.  Performed at [facility], [address], [place], [place] [address]   Culture, blood (Routine X 2) w Reflex to ID Panel     Status: None (Preliminary result)   Collection Time: 12/06/20  4:03 PM   Specimen: BLOOD RIGHT HAND  Result Value Ref Range Status   Specimen Description BLOOD RIGHT HAND  Final   Special Requests   Final    BOTTLES DRAWN AEROBIC ONLY Blood Culture adequate volume   Culture   Final    NO GROWTH < 24 HOURS Performed at [facility], [address], [place], [place] [address]    Report Status PENDING  Incomplete  Culture, blood (Routine X 2) w Reflex to ID Panel     Status: None (Preliminary result)   Collection Time: 12/06/20  4:12 PM   Specimen: BLOOD LEFT HAND  Result Value Ref Range Status   Specimen Description BLOOD LEFT HAND  Final   Special Requests   Final    BOTTLES DRAWN AEROBIC ONLY Blood Culture adequate volume   Culture   Final    NO GROWTH < 24 HOURS Performed at [facility], [address], [place], [place] [address]    Report Status PENDING  Incomplete      Radiology Studies: ECHOCARDIOGRAM COMPLETE  Result Date: 12/06/2020    ECHOCARDIOGRAM REPORT   Patient Name:   NAVYA TIMMONS Date of Exam: 12/06/2020 Medical Rec #:  [id number]    Height:       60.0 in Accession #:    [id number]   Weight:       141.5 lb Date of Birth:  [birth year]    BSA:  1.612 m Patient Age:     74 years     BP:           150/92 mmHg Patient Gender: F            HR:           72 bpm. Exam Location:  Inpatient Procedure: 2D Echo, Cardiac Doppler and Color Doppler Indications:    Stroke I63.9  History:        Patient has no prior history of Echocardiogram examinations.                 Signs/Symptoms:Altered Mental Status; Risk Factors:Former                 Smoker. Hypertensive urgency. Hypothyroidism. Passed out in                 parking lot after shopping.  Sonographer:    Roosvelt Maser RDCS Referring Phys: [id number] [facility] IMPRESSIONS  1. 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 It measures approximately 13 mm in maximum length and 5 mm in width, with a relatively narrow attachment base. Left ventricular ejection fraction, by estimation, is 60 to 65%. The left ventricle has normal function. The left ventricle has no regional wall motion abnormalities. There is mild concentric left ventricular hypertrophy. Left ventricular diastolic parameters are consistent with Grade I diastolic dysfunction (impaired relaxation). Elevated left atrial pressure.  2. Right ventricular systolic function is normal. The right ventricular size is normal.  3. The mitral valve is normal in structure. No evidence of mitral valve regurgitation. No evidence of mitral stenosis.  4. The aortic valve is normal in structure. Aortic valve regurgitation is not visualized. No aortic stenosis is present.  5. The inferior vena cava is normal in size with greater than 50% respiratory variability, suggesting right atrial pressure of 3 mmHg. Conclusion(s)/Recommendation(s): Discussed with primary team. The left ventricular mass is highly atypical for either thrombus or vegetation and the location is unusual for myxoma or papillary fibroelastoma. Its mobility suggests significant embolic potential. While TEE  would offer better resolution, it may not add diagnostic value. Consider cardiac MRI. 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 It measures approximately 13 mm in maximum length and 5 mm in width, with a relatively narrow attachment base. Left ventricular ejection fraction, by estimation, is 60 to 65%. The left ventricle has normal function. The left ventricle has no regional wall motion abnormalities. The left ventricular internal cavity size was normal in size. There is mild concentric left ventricular hypertrophy. Left ventricular diastolic parameters are consistent with Grade I diastolic dysfunction (impaired relaxation). Elevated left atrial pressure. Right Ventricle: The right ventricular size is normal. No increase in right ventricular wall thickness. Right ventricular systolic function is normal. Left Atrium: Left atrial size was normal in size. Right Atrium: Right atrial size was normal in size. Pericardium: There is no evidence of pericardial effusion. Mitral Valve: The mitral valve is normal in structure. No evidence of mitral valve regurgitation. No evidence of mitral valve stenosis. Tricuspid Valve: The tricuspid valve is normal in structure. Tricuspid valve regurgitation is not demonstrated. No evidence of tricuspid stenosis. Aortic Valve: The aortic valve is normal in structure. Aortic valve regurgitation is not  visualized. No aortic stenosis is present. Aortic valve mean gradient measures 5.0 mmHg. Aortic valve peak gradient measures 8.1 mmHg. Aortic valve area, by VTI measures 1.71 cm. Pulmonic Valve: The pulmonic valve was normal in structure. Pulmonic valve regurgitation is not visualized. No evidence of pulmonic stenosis. Aorta: The aortic root is normal in size and structure. Venous: The inferior vena cava is normal in size with greater than  50% respiratory variability, suggesting right atrial pressure of 3 mmHg. IAS/Shunts: No atrial level shunt detected by color flow Doppler.  LEFT VENTRICLE PLAX 2D LVIDd:         3.60 cm LVIDs:         2.60 cm  Diastology LV PW:         1.20 cm  LV e' medial:    3.97 cm/s LV IVS:        1.50 cm  LV E/e' medial:  16.8 LVOT diam:     1.70 cm  LV e' lateral:   5.35 cm/s LV SV:         49       LV E/e' lateral: 12.5 LV SV Index:   30 LVOT Area:     2.27 cm  RIGHT VENTRICLE RV Basal diam:  3.00 cm LEFT ATRIUM             Index       RIGHT ATRIUM           Index LA diam:        2.10 cm 1.30 cm/m  RA Area:     10.60 cm LA Vol (A2C):   36.7 ml 22.77 ml/m RA Volume:   21.10 ml  13.09 ml/m LA Vol (A4C):   21.6 ml 13.40 ml/m LA Biplane Vol: 30.1 ml 18.68 ml/m  AORTIC VALVE AV Area (Vmax):    1.65 cm AV Area (Vmean):   1.48 cm AV Area (VTI):     1.71 cm AV Vmax:           142.00 cm/s AV Vmean:          105.000 cm/s AV VTI:            0.285 m AV Peak Grad:      8.1 mmHg AV Mean Grad:      5.0 mmHg LVOT Vmax:         103.00 cm/s LVOT Vmean:        68.300 cm/s LVOT VTI:          0.215 m LVOT/AV VTI ratio: 0.75  AORTA Ao Root diam: 2.40 cm MITRAL VALVE MV Area (PHT): 2.29 cm     SHUNTS MV Decel Time: 332 msec     Systemic VTI:  0.22 m MV E velocity: 66.80 cm/s   Systemic Diam: 1.70 cm MV A velocity: 109.00 cm/s MV E/A ratio:  0.61 Mihai Croitoru MD Electronically signed by Thurmon Fair MD Signature Date/Time: 12/06/2020/2:42:42 PM    Final    US THYROID  Result Date: 12/06/2020 CLINICAL DATA:  Goiter.  Elevated TSH EXAM: THYROID ULTRASOUND TECHNIQUE: Ultrasound examination of the thyroid gland and adjacent soft tissues was performed. COMPARISON:  None. FINDINGS: Parenchymal Echotexture: None identified Isthmus: Not visualized Right lobe: Not visualized Left lobe: Not visualized _________________________________________________________ Estimated total number of nodules >/= 1 cm: 0 Number of spongiform nodules >/=  2 cm  not described below (TR1): 0 Number of mixed cystic and solid nodules >/= 1.5 cm not described below (TR2): 0 _________________________________________________________ No  normal thyroid parenchyma was localized. No regional cervical adenopathy. IMPRESSION: Nonvisualization of thyroid. In the absence of previous thyroidectomy, this would suggest marked parenchymal atrophy. The above is in keeping with the ACR TI-RADS recommendations - J Am Coll Radiol 2017;14:587-595. Electronically Signed   By: Corlis Leak M.D.   On: 12/06/2020 15:53    Pamella Pert, MD, PhD [facility]  Between 7 am - 7 pm I am available, please contact me via Amion (for emergencies) or Securechat (non urgent messages)  Between 7 pm - 7 am I am not available, please contact night coverage MD/APP via Amion

## 2020-12-08 ENCOUNTER — Inpatient Hospital Stay (HOSPITAL_COMMUNITY): Payer: Medicare HMO

## 2020-12-08 DIAGNOSIS — G9341 Metabolic encephalopathy: Secondary | ICD-10-CM | POA: Diagnosis not present

## 2020-12-08 DIAGNOSIS — I5189 Other ill-defined heart diseases: Secondary | ICD-10-CM

## 2020-12-08 DIAGNOSIS — E785 Hyperlipidemia, unspecified: Secondary | ICD-10-CM | POA: Diagnosis not present

## 2020-12-08 LAB — COMPREHENSIVE METABOLIC PANEL
ALT: 21 U/L (ref 0–44)
AST: 86 U/L — ABNORMAL HIGH (ref 15–41)
Albumin: 3.3 g/dL — ABNORMAL LOW (ref 3.5–5.0)
Alkaline Phosphatase: 68 U/L (ref 38–126)
Anion gap: 9 (ref 5–15)
BUN: 16 mg/dL (ref 8–23)
CO2: 20 mmol/L — ABNORMAL LOW (ref 22–32)
Calcium: 8.9 mg/dL (ref 8.9–10.3)
Chloride: 99 mmol/L (ref 98–111)
Creatinine, Ser: 1.13 mg/dL — ABNORMAL HIGH (ref 0.44–1.00)
GFR, Estimated: 51 mL/min — ABNORMAL LOW (ref >60)
Glucose, Bld: 73 mg/dL (ref 70–99)
Potassium: 3.1 mmol/L — ABNORMAL LOW (ref 3.5–5.1)
Sodium: 128 mmol/L — ABNORMAL LOW (ref 135–145)
Total Bilirubin: 0.6 mg/dL (ref 0.3–1.2)
Total Protein: 6.3 g/dL — ABNORMAL LOW (ref 6.5–8.1)

## 2020-12-08 LAB — CBC
HCT: 31.1 % — ABNORMAL LOW (ref 36.0–46.0)
Hemoglobin: 10.6 g/dL — ABNORMAL LOW (ref 12.0–15.0)
MCH: 32 pg (ref 26.0–34.0)
MCHC: 34.1 g/dL (ref 30.0–36.0)
MCV: 94 fL (ref 80.0–100.0)
Platelets: 165 10*3/uL (ref 150–400)
RBC: 3.31 MIL/uL — ABNORMAL LOW (ref 3.87–5.11)
RDW: 13.6 % (ref 11.5–15.5)
WBC: 6.1 10*3/uL (ref 4.0–10.5)
nRBC: 0 % (ref 0.0–0.2)

## 2020-12-08 LAB — HEMOGLOBIN A1C
Hgb A1c MFr Bld: 5.5 % (ref 4.8–5.6)
Mean Plasma Glucose: 111 mg/dL

## 2020-12-08 LAB — HSV 1/2 PCR, CSF
HSV-1 DNA: NEGATIVE
HSV-2 DNA: NEGATIVE

## 2020-12-08 LAB — ECHOCARDIOGRAM LIMITED
Height: 60 in
Weight: 2264.57 oz

## 2020-12-08 LAB — HEPARIN LEVEL (UNFRACTIONATED)
Heparin Unfractionated: 0.36 IU/mL (ref 0.30–0.70)
Heparin Unfractionated: 0.27 IU/mL — ABNORMAL LOW (ref 0.30–0.70)
Heparin Unfractionated: 0.26 IU/mL — ABNORMAL LOW (ref 0.30–0.70)
Heparin Unfractionated: 0.21 IU/mL — ABNORMAL LOW (ref 0.30–0.70)

## 2020-12-08 MED ORDER — POTASSIUM CHLORIDE CRYS ER 20 MEQ PO TBCR
40.0000 meq | EXTENDED_RELEASE_TABLET | Freq: Once | ORAL | Status: AC
  Administered 2020-12-08: 40 meq via ORAL
  Filled 2020-12-08: qty 2

## 2020-12-08 NOTE — Progress Notes (Signed)
PROGRESS NOTE  Kanitra Purifoy YIF:[id number] DOB: [birth year] DOA: 12/05/2020 PCP: Brooke Bonito, MD   LOS: 3 days   Brief Narrative / Interim history: 75 year old female with significant history of anemia, tobacco use in the past, comes into the hospital after being found altered in her car in the [employer] driving lot.  She apparently went in, did grocery shopping and then got back into her car and passed out.  EMS was called and she was brought to the hospital.  Patient does not have a PCP and does not seek regular care and is not on any medications.  Son apparently talked to her over the phone 6 days prior to admission and she seems to be in her normal state of health.  She was admitted as a code stroke.  On admission she underwent an LP and was placed on empiric acyclovir.  She was found to be hypothyroid and also have an intracardiac mobile mass.  Neurology and cardiology consulted  Subjective / 24h Interval events: No more agitation overnight, she is Calmer this morning and again more appropriate.  Assessment & Plan: Principal Problem Severe hypothyroidism-work-up on admission revealed a significantly elevated TSH to 41.4.  Patient tells me she has a "borderline hypothyroidism" but cannot elaborate further.  Family unaware about any thyroid issues. -Thyroid ultrasound was unable to visualize the thyroid, in the absence of previous thyroidectomy this would suggest marked parenchymal atrophy -Remains intermittently encephalopathic and continue IV Synthroid for now, convert to p.o at 75 MCG when his consistent oral intake. -Repeat TSH in 3 to 4 weeks  Active Problems Acute metabolic encephalopathy-possibly main driver is untreated hypothyroidism.  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  Cardiology consulted.  Somewhat atypical for vegetation or thrombus or mass -Blood cultures were obtained -She has been placed on heparin at cardiology recommendations, and repeat echocardiogram in a few days and if still present she will need a cardiac MRI.  Hypokalemia-replete  Hyperlipidemia-her LDL is 327.  Continue statin  Probable essential hypertension-hypertensive at times but that is when she is agitated, normotensive otherwise.  Continue to monitor  Borderline B12 deficiency-started on supplementation  Chronic kidney disease stage IIIa-creatinine at her baseline  Hyponatremia-likely hypothyroidism induced.  Monitor.  Improving  Elevated LFTs-in a pattern that raises suspicion for EtOH use.  Son tells me that she drank in the past but he is unaware of her currently drinking   Scheduled Meds:  atorvastatin  80 mg Oral Daily   cyanocobalamin  1,000 mcg Intramuscular Daily   levothyroxine  50 mcg Intravenous Daily   sodium chloride flush  3 mL Intravenous Q12H   [START ON 12/13/2020] thiamine injection  100 mg Intravenous Daily   Continuous Infusions:  sodium chloride 125 mL/hr at 12/08/20 0952   acyclovir Stopped (12/08/20 0425)   heparin 600 Units/hr (12/08/20 0952)   thiamine injection 250 mg (12/07/20 1421)  PRN Meds:.haloperidol lactate, hydrALAZINE, LORazepam, ondansetron **OR** ondansetron (ZOFRAN) IV  Diet Orders (From admission, onward)     Start     Ordered   12/06/20 0957  DIET DYS 2 Room service appropriate? Yes with Assist; Fluid consistency: Thin  Diet effective now        Question Answer Comment  Room service appropriate? Yes with Assist   Fluid consistency: Thin      12/06/20 0956            DVT prophylaxis:      Code Status: Full Code  Family Communication: Son present at bedside  Status is: Inpatient  Remains inpatient appropriate because:Altered mental status and Inpatient level of care appropriate due to severity of illness  Dispo: The patient is from: Home              Anticipated d/c is to: SNF              Patient currently is not medically stable to d/c.   Difficult to place patient No   Level of care: Telemetry Medical  Consultants:  none  Procedures:  none  Microbiology  none  Antimicrobials: none    Objective: Vitals:   12/07/20 2040 12/08/20 0032 12/08/20 0317 12/08/20 0753  BP: 130/88 (!) 162/91 (!) 160/84 (!) 152/87  Pulse: 64 66 64 66  Resp: 19 18 18 18   Temp: 99 F (37.2 C) 98.9 F (37.2 C) 98 F (36.7 C) 98.3 F (36.8 C)  TempSrc:    Oral  SpO2: 98% 96% 98% 97%  Weight:      Height:        Intake/Output Summary (Last 24 hours) at 12/08/2020 1012 Last data filed at 12/08/2020 02/07/2021 Gross per 24 hour  Intake 3057.42 ml  Output 200 ml  Net 2857.42 ml    Filed Weights   12/05/20 1200  Weight: 64.2 kg    Examination:  Constitutional: No distress Eyes: No scleral icterus ENMT: Moist mucous membranes Neck: normal, supple Respiratory: Clear bilaterally, no wheezing, no crackles, normal respiratory effort Cardiovascular: Regular rate and rhythm, no murmurs, no peripheral edema Abdomen: soft, nontender, nondistended, bowel sounds positive Musculoskeletal: no clubbing / cyanosis.  Skin: No rash appreciated Neurologic: Equal strength, no focal deficits   Data Reviewed: I have independently reviewed following labs and imaging studies   CBC: Recent Labs  Lab 12/05/20 1247 12/05/20 1252 12/06/20 0138 12/07/20 0222 12/08/20 0108  WBC 5.8  --  8.1 8.6 6.1  NEUTROABS 3.9  --  7.1  --    --   HGB 10.6* 11.2* 11.8* 11.8* 10.6*  HCT 32.6* 33.0* 34.1* 34.4* 31.1*  MCV 97.3  --  93.2 93.2 94.0  PLT 201  --  174 174 165    Basic Metabolic Panel: Recent Labs  Lab 12/05/20 1247 12/05/20 1252 12/06/20 0138 12/07/20 0222 12/08/20 0108  NA 131* 132* 127* 128* 128*  K 3.9 3.9 3.8 3.7 3.1*  CL 96* 99 96* 96* 99  CO2 23  --  22 23 20*  GLUCOSE 113* 109* 111* 83 73  BUN 14 15 11 13 16   CREATININE 1.22* 1.00 0.74 1.15* 1.13*  CALCIUM 9.5  --  9.3 9.5 8.9  MG  --   --   --  2.2  --   PHOS  --   --   --  3.0  --     Liver Function Tests: Recent Labs  Lab 12/05/20 1247 12/06/20 0138 12/07/20 0222 12/08/20  0108  AST 68* 90* 109* 86*  ALT 22 26 26 21   ALKPHOS 66 72 76 68  BILITOT 0.4 0.5 0.6 0.6  PROT 7.2 7.5 7.5 6.3*  ALBUMIN 4.0 4.1 3.9 3.3*    Coagulation Profile: Recent Labs  Lab 12/05/20 1247  INR 1.0    HbA1C: Recent Labs    12/05/20 1956  HGBA1C 5.5   CBG: Recent Labs  Lab 12/05/20 1248  GLUCAP 110*     Recent Results (from the past 240 hour(s))  CSF culture w Stat Gram Stain     Status: None (Preliminary result)   Collection Time: 12/05/20  3:50 PM   Specimen: CSF; Cerebrospinal Fluid  Result Value Ref Range Status   Specimen Description CSF  Final   Special Requests NONE  Final   Gram Stain   Final    WBC PRESENT, PREDOMINANTLY MONONUCLEAR NO ORGANISMS SEEN CYTOSPIN SMEAR    Culture   Final    NO GROWTH 3 DAYS Performed at [facility], [address] [place], [place] [address]    Report Status PENDING  Incomplete  SARS CORONAVIRUS 2 (TAT 6-24 HRS) Nasopharyngeal Nasopharyngeal Swab     Status: None   Collection Time: 12/05/20  6:47 PM   Specimen: Nasopharyngeal Swab  Result Value Ref Range Status   SARS Coronavirus 2 NEGATIVE NEGATIVE Final    Comment: (NOTE) SARS-CoV-2 target nucleic acids are NOT DETECTED.  The SARS-CoV-2 RNA is generally detectable in upper and lower respiratory specimens during the acute phase  of infection. Negative results do not preclude SARS-CoV-2 infection, do not rule out co-infections with other pathogens, and should not be used as the sole basis for treatment or other patient management decisions. Negative results must be combined with clinical observations, patient history, and epidemiological information. The expected result is Negative.  Fact Sheet for Patients: HairSlick.nohttps://www.fda.gov/media/138098/download  Fact Sheet for Healthcare Providers: quierodirigir.comhttps://www.fda.gov/media/138095/download  This test is not yet approved or cleared by the [place] FDA and  has been authorized for detection and/or diagnosis of SARS-CoV-2 by FDA under an Emergency Use Authorization (EUA). This EUA will remain  in effect (meaning this test can be used) for the duration of the COVID-19 declaration under Se ction 564(b)(1) of the Act, 21 U.S.C. section 360bbb-3(b)(1), unless the authorization is terminated or revoked sooner.  Performed at [facility], [address] [place], [place] [address]   Culture, blood (Routine X 2) w Reflex to ID Panel     Status: None (Preliminary result)   Collection Time: 12/06/20  4:03 PM   Specimen: BLOOD RIGHT HAND  Result Value Ref Range Status   Specimen Description BLOOD RIGHT HAND  Final   Special Requests   Final    BOTTLES DRAWN AEROBIC ONLY Blood Culture adequate volume   Culture   Final    NO GROWTH 2 DAYS Performed at [facility], [address] [place], [place] [address]    Report Status PENDING  Incomplete  Culture, blood (Routine X 2) w Reflex to ID Panel     Status: None (Preliminary result)   Collection Time: 12/06/20  4:12 PM   Specimen: BLOOD LEFT HAND  Result Value Ref Range Status   Specimen Description BLOOD LEFT HAND  Final   Special Requests   Final    BOTTLES DRAWN AEROBIC ONLY Blood Culture adequate volume   Culture   Final    NO GROWTH 2 DAYS Performed at [facility],  [address]  [place], [place] [address]    Report Status PENDING  Incomplete      Radiology Studies: No results found.  Pamella Pert, MD, PhD [facility]  Between 7 am - 7 pm I am available, please contact me via Amion (for emergencies) or Securechat (non urgent messages)  Between 7 pm - 7 am I am not available, please contact night coverage MD/APP via Amion

## 2020-12-08 NOTE — Plan of Care (Signed)
  Problem: Safety: Goal: Non-violent Restraint(s) Outcome: Progressing   Problem: Education: Goal: Knowledge of disease or condition will improve Outcome: Progressing Goal: Knowledge of secondary prevention will improve Outcome: Progressing Goal: Knowledge of patient specific risk factors addressed and post discharge goals established will improve Outcome: Progressing   Problem: Coping: Goal: Will verbalize positive feelings about self Outcome: Progressing Goal: Will identify appropriate support needs Outcome: Progressing   Problem: Health Behavior/Discharge Planning: Goal: Ability to manage health-related needs will improve Outcome: Progressing   Problem: Self-Care: Goal: Ability to participate in self-care as condition permits will improve Outcome: Progressing Goal: Verbalization of feelings and concerns over difficulty with self-care will improve Outcome: Progressing Goal: Ability to communicate needs accurately will improve Outcome: Progressing   Problem: Nutrition: Goal: Risk of aspiration will decrease Outcome: Progressing Goal: Dietary intake will improve Outcome: Progressing   Problem: Intracerebral Hemorrhage Tissue Perfusion: Goal: Complications of Intracerebral Hemorrhage will be minimized Outcome: Progressing   Problem: Ischemic Stroke/TIA Tissue Perfusion: Goal: Complications of ischemic stroke/TIA will be minimized Outcome: Progressing   Problem: Spontaneous Subarachnoid Hemorrhage Tissue Perfusion: Goal: Complications of Spontaneous Subarachnoid Hemorrhage will be minimized Outcome: Progressing   Problem: Education: Goal: Knowledge of General Education information will improve Description: Including pain rating scale, medication(s)/side effects and non-pharmacologic comfort measures Outcome: Progressing   Problem: Health Behavior/Discharge Planning: Goal: Ability to manage health-related needs will improve Outcome: Progressing   Problem:  Clinical Measurements: Goal: Ability to maintain clinical measurements within normal limits will improve Outcome: Progressing Goal: Will remain free from infection Outcome: Progressing Goal: Diagnostic test results will improve Outcome: Progressing Goal: Respiratory complications will improve Outcome: Progressing Goal: Cardiovascular complication will be avoided Outcome: Progressing   Problem: Activity: Goal: Risk for activity intolerance will decrease Outcome: Progressing   Problem: Nutrition: Goal: Adequate nutrition will be maintained Outcome: Progressing   Problem: Coping: Goal: Level of anxiety will decrease Outcome: Progressing   Problem: Elimination: Goal: Will not experience complications related to bowel motility Outcome: Progressing Goal: Will not experience complications related to urinary retention Outcome: Progressing   Problem: Pain Managment: Goal: General experience of comfort will improve Outcome: Progressing   Problem: Safety: Goal: Ability to remain free from injury will improve Outcome: Progressing   Problem: Skin Integrity: Goal: Risk for impaired skin integrity will decrease Outcome: Progressing

## 2020-12-08 NOTE — TOC Initial Note (Signed)
Transition of Care [facility]) - Initial/Assessment Note    Patient Details  Name: Jane Navarro MRN: [id number] Date of Birth: 11-Jan-1946  Transition of Care [facility]) CM/SW Contact:    Geralynn Ochs, LCSW Phone Number: 12/08/2020, 3:29 PM  Clinical Narrative:      CSW met with daughter, Lattie Haw, at bedside to discuss recommendation for SNF. Lattie Haw in agreement, and said that patient is still not fully with it at this time but they've started the conversation about how she'll need rehab at discharge. No preference on SNF, but would like to be in the [place] area. Patient is not vaccinated and does not want to be. CSW discussed how that will limit facility options. CSW to fax out referral and will update family with bed offers.           Expected Discharge Plan: Skilled Nursing Facility Barriers to Discharge: Continued Medical Work up, Ship broker   Patient Goals and CMS Choice Patient states their goals for this hospitalization and ongoing recovery are:: patient unable to participate in goal setting, not fully oriented CMS Medicare.gov Compare Post Acute Care list provided to:: Patient Represenative (must comment) Choice offered to / list presented to : Adult Children  Expected Discharge Plan and Services Expected Discharge Plan: [place] Acute Care Choice: [place] arrangements for the past 2 months: Single Family Home                                      Prior Living Arrangements/Services Living arrangements for the past 2 months: Single Family Home Lives with:: Self Patient language and need for interpreter reviewed:: No Do you feel safe going back to the place where you live?: Yes      Need for Family Participation in Patient Care: Yes (Comment) Care giver support system in place?: No (comment)   Criminal Activity/Legal Involvement Pertinent to Current Situation/Hospitalization: No -  Comment as needed  Activities of Daily Living Home Assistive Devices/Equipment: Walker (specify type) (3way) ADL Screening (condition at time of admission) Patient's cognitive ability adequate to safely complete daily activities?: No Is the patient deaf or have difficulty hearing?: No Does the patient have difficulty seeing, even when wearing glasses/contacts?: Yes Does the patient have difficulty concentrating, remembering, or making decisions?: Yes Patient able to express need for assistance with ADLs?: Yes Does the patient have difficulty dressing or bathing?: Yes Independently performs ADLs?: No (not at this time) Walks in Home: Needs assistance Is this a change from baseline?: Pre-admission baseline Does the patient have difficulty walking or climbing stairs?: Yes Weakness of Legs: Both Weakness of Arms/Hands: None  Permission Sought/Granted Permission sought to share information with : Facility Sport and exercise psychologist, Family Supports Permission granted to share information with : Yes, Verbal Permission Granted  Share Information with NAME: Randie Heinz  Permission granted to share info w AGENCY: SNF  Permission granted to share info w Relationship: Children     Emotional Assessment   Attitude/Demeanor/Rapport: Unable to Assess Affect (typically observed): Unable to Assess Orientation: : Oriented to Self, Oriented to Place, Oriented to Situation Alcohol / Substance Use: Not Applicable Psych Involvement: No (comment)  Admission diagnosis:  Acute encephalopathy [E99.37] Acute metabolic encephalopathy [J69.67] Patient Active Problem List   Diagnosis Date Noted   Acute metabolic encephalopathy 89/38/1017   Hypertensive urgency 12/05/2020   CKD (chronic kidney disease), stage III (  [place]) 12/05/2020   Normocytic anemia 12/05/2020   Transaminitis 12/05/2020   PCP:  Reita Cliche, MD Pharmacy:   [facility] ([facility]) - [address], [place] [address] Phone: [phone] Fax: [phone]     Social Determinants of Health (SDOH) Interventions    Readmission Risk Interventions No flowsheet data found.

## 2020-12-08 NOTE — Progress Notes (Signed)
  Echocardiogram 2D Echocardiogram has been performed.  Jane Navarro 12/08/2020, 3:43 PM

## 2020-12-08 NOTE — Progress Notes (Signed)
ANTICOAGULATION CONSULT NOTE - Follow Up Consult  Pharmacy Consult for heparin Indication:  LV thrombus  in setting of possible stroke  Labs: Recent Labs    12/06/20 0138 12/07/20 0222 12/07/20 1156 12/08/20 0108 12/08/20 1133 12/08/20 2146  HGB 11.8* 11.8*  --  10.6*  --   --   HCT 34.1* 34.4*  --  31.1*  --   --   PLT 174 174  --  165  --   --   HEPARINUNFRC  --  0.65   < > 0.27* 0.26* 0.21*  CREATININE 0.74 1.15*  --  1.13*  --   --    < > = values in this interval not displayed.     Assessment: 74yo female subtherapeutic on heparin with lower heparin level despite increased rate; no infusion issues or signs of bleeding per RN.  Goal of Therapy:  Heparin level 0.3-0.5 units/ml   Plan:  Will increase heparin infusion by 2-3 units/kg/hr to 850 units/hr and check level in 8 hours.    Vernard Gambles, PharmD, BCPS  12/08/2020,11:08 PM

## 2020-12-08 NOTE — [place] FL2 (Signed)
[place] MEDICAID FL2 LEVEL OF CARE SCREENING TOOL     IDENTIFICATION  Patient Name: Jane Navarro Birthdate: May 27, 1945 Sex: female Admission Date (Current Location): 12/05/2020  [facility] and [place] Number:  Producer, television/film/video and Address:  [facility] [place], [address], [place], [place] [address]      Provider Number: [id number]  Attending Physician Name and Address:  Leatha Gilding, MD  Relative Name and Phone Number:       Current Level of Care: Hospital Recommended Level of Care: Skilled Nursing Facility Prior Approval Number:    Date Approved/Denied:   PASRR Number: [id number]  Discharge Plan: SNF    Current Diagnoses: Patient Active Problem List   Diagnosis Date Noted   Acute metabolic encephalopathy 12/05/2020   Hypertensive urgency 12/05/2020   CKD (chronic kidney disease), stage III (H[facility]) 12/05/2020   Normocytic anemia 12/05/2020   Transaminitis 12/05/2020    Orientation RESPIRATION BLADDER Height & Weight     Self, Situation, Place  Normal Incontinent Weight: 141 lb 8.6 oz (64.2 kg) Height:  5' (152.4 cm)  BEHAVIORAL SYMPTOMS/MOOD NEUROLOGICAL BOWEL NUTRITION STATUS      Continent Diet (see DC summary)  AMBULATORY STATUS COMMUNICATION OF NEEDS Skin   Extensive Assist Verbally Skin abrasions                       Personal Care Assistance Level of Assistance  Bathing, Feeding, Dressing Bathing Assistance: Limited assistance Feeding assistance: Limited assistance Dressing Assistance: Limited assistance     Functional Limitations Info  Speech     Speech Info: Impaired (dysarthria, delayed responses)    SPECIAL CARE FACTORS FREQUENCY  PT (By licensed PT), OT (By licensed OT), Speech therapy     PT Frequency: 5x/wk OT Frequency: 5x/wk     Speech Therapy Frequency: 5x/wk      Contractures Contractures Info: Not present    Additional Factors Info  Code Status, Allergies Code Status Info: Full Allergies  Info: Ampicillin, Ciprofloxacin, Erythromycin, Penicillins           Current Medications (12/08/2020):  This is the current hospital active medication list Current Facility-Administered Medications  Medication Dose Route Frequency Provider Last Rate Last Admin   0.9 %  sodium chloride infusion   Intravenous Continuous Paytes, Florentina Addison, RPH 125 mL/hr at 12/08/20 1214 New Bag at 12/08/20 1214   acyclovir (ZOVIRAX) 640 mg in dextrose 5 % 100 mL IVPB  10 mg/kg Intravenous Q12H Leatha Gilding, MD   Stopped at 12/08/20 0425   atorvastatin (LIPITOR) tablet 80 mg  80 mg Oral Daily Leatha Gilding, MD   80 mg at 12/08/20 1030   cyanocobalamin ((VITAMIN B-12)) injection 1,000 mcg  1,000 mcg Intramuscular Daily Leda Gauze, NP   1,000 mcg at 12/08/20 1030   haloperidol lactate (HALDOL) injection 5 mg  5 mg Intravenous Q6H PRN Leatha Gilding, MD   5 mg at 12/06/20 1906   heparin ADULT infusion 100 units/mL (25000 units/233mL)  700 Units/hr Intravenous Continuous Hammons, Kimberly B, RPH 7 mL/hr at 12/08/20 1301 700 Units/hr at 12/08/20 1301   hydrALAZINE (APRESOLINE) injection 10 mg  10 mg Intravenous Q4H PRN Madelyn Flavors A, MD       levothyroxine (SYNTHROID, LEVOTHROID) injection 50 mcg  50 mcg Intravenous Daily Leatha Gilding, MD   50 mcg at 12/08/20 0849   LORazepam (ATIVAN) injection 0.5 mg  0.5 mg Intravenous Q4H PRN Gherghe, Costin M,  MD   0.5 mg at 12/08/20 1345   ondansetron (ZOFRAN) tablet 4 mg  4 mg Oral Q6H PRN Clydie Braun, MD       Or   ondansetron (ZOFRAN) injection 4 mg  4 mg Intravenous Q6H PRN Smith, Rondell A, MD       sodium chloride flush (NS) 0.9 % injection 3 mL  3 mL Intravenous Q12H Smith, Rondell A, MD   3 mL at 12/08/20 1031   thiamine (B-1) 250 mg in sodium chloride 0.9 % 50 mL IVPB  250 mg Intravenous Daily Kirby-Graham, Beather Arbour, NP 100 mL/hr at 12/08/20 1034 250 mg at 12/08/20 1034   Followed by   Melene Muller ON 12/13/2020] thiamine (B-1) injection 100 mg   100 mg Intravenous Daily Kirby-Graham, Beather Arbour, NP         Discharge Medications: Please see discharge summary for a list of discharge medications.  Relevant Imaging Results:  Relevant Lab Results:   Additional Information SS#: [id number]  Baldemar Lenis, LCSW

## 2020-12-08 NOTE — Progress Notes (Addendum)
Progress Note  Patient Name: Jane Navarro Date of Encounter: 12/08/2020  [facility] Cardiologist: Parke Poisson, MD  Subjective   No acute overnight events. Patient's daughter is at bedside. Patient is still disoriented but daughter states better than yesterday. No complaints this morning. No chest pain or shortness of breath.  Inpatient Medications    Scheduled Meds:  atorvastatin  80 mg Oral Daily   cyanocobalamin  1,000 mcg Intramuscular Daily   levothyroxine  50 mcg Intravenous Daily   sodium chloride flush  3 mL Intravenous Q12H   [START ON 12/13/2020] thiamine injection  100 mg Intravenous Daily   Continuous Infusions:  sodium chloride 125 mL/hr at 12/08/20 1214   acyclovir Stopped (12/08/20 0425)   heparin 700 Units/hr (12/08/20 1301)   thiamine injection 250 mg (12/08/20 1034)   PRN Meds: haloperidol lactate, hydrALAZINE, LORazepam, ondansetron **OR** ondansetron (ZOFRAN) IV   Vital Signs    Vitals:   12/08/20 0032 12/08/20 0317 12/08/20 0753 12/08/20 1123  BP: (!) 162/91 (!) 160/84 (!) 152/87 121/86  Pulse: 66 64 66 64  Resp: 18 18 18 14   Temp: 98.9 F (37.2 C) 98 F (36.7 C) 98.3 F (36.8 C) 97.6 F (36.4 C)  TempSrc:   Oral Oral  SpO2: 96% 98% 97% 98%  Weight:      Height:        Intake/Output Summary (Last 24 hours) at 12/08/2020 1307 Last data filed at 12/08/2020 0952 Gross per 24 hour  Intake 2937.42 ml  Output --  Net 2937.42 ml   Last 3 Weights 12/05/2020 12/16/2014  Weight (lbs) 141 lb 8.6 oz 158 lb  Weight (kg) 64.2 kg 71.668 kg      Telemetry    Normal sinus rhythm with rates in the 60s. - Personally Reviewed  ECG    No new ECG tracing today. - Personally Reviewed  Physical Exam   GEN: No acute distress.   Neck: No JVD. Cardiac: RRR. No murmurs, rubs, or gallops.  Respiratory: Clear to auscultation bilaterally. GI: Soft, non-tender, non-distended  MS: No lower extremity edema. No deformity. Skin: Warm and dry. Neuro:   Still disoriented but improved per daughter. Follows commands. No focal deficits. Psych: Normal affect.  Labs    High Sensitivity Troponin:  No results for input(s): TROPONINIHS in the last 720 hours.    Chemistry Recent Labs  Lab 12/06/20 0138 12/07/20 0222 12/08/20 0108  NA 127* 128* 128*  K 3.8 3.7 3.1*  CL 96* 96* 99  CO2 22 23 20*  GLUCOSE 111* 83 73  BUN 11 13 16   CREATININE 0.74 1.15* 1.13*  CALCIUM 9.3 9.5 8.9  PROT 7.5 7.5 6.3*  ALBUMIN 4.1 3.9 3.3*  AST 90* 109* 86*  ALT 26 26 21   ALKPHOS 72 76 68  BILITOT 0.5 0.6 0.6  GFRNONAA >60 50* 51*  ANIONGAP 9 9 9      Hematology Recent Labs  Lab 12/06/20 0138 12/07/20 0222 12/08/20 0108  WBC 8.1 8.6 6.1  RBC 3.66* 3.69* 3.31*  HGB 11.8* 11.8* 10.6*  HCT 34.1* 34.4* 31.1*  MCV 93.2 93.2 94.0  MCH 32.2 32.0 32.0  MCHC 34.6 34.3 34.1  RDW 13.3 13.5 13.6  PLT 174 174 165    BNPNo results for input(s): BNP, PROBNP in the last 168 hours.   DDimer No results for input(s): DDIMER in the last 168 hours.   Radiology    ECHOCARDIOGRAM COMPLETE  Result Date: 12/06/2020    ECHOCARDIOGRAM REPORT  Patient Name:   Jane Navarro Date of Exam: 12/06/2020 Medical Rec #:  [id number]    Height:       60.0 in Accession #:    [id number]   Weight:       141.5 lb Date of Birth:  1946-01-09    BSA:          1.612 m Patient Age:    74 years     BP:           150/92 mmHg Patient Gender: F            HR:           72 bpm. Exam Location:  Inpatient Procedure: 2D Echo, Cardiac Doppler and Color Doppler Indications:    Stroke I63.9  History:        Patient has no prior history of Echocardiogram examinations.                 Signs/Symptoms:Altered Mental Status; Risk Factors:Former                 Smoker. Hypertensive urgency. Hypothyroidism. Passed out in                 parking lot after shopping.  Sonographer:    Roosvelt Maser RDCS Referring Phys: [id number] [facility] IMPRESSIONS  1. 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 It measures approximately 13 mm in maximum length and 5 mm in width, with a relatively narrow attachment base. Left ventricular ejection fraction, by estimation, is 60 to 65%. The left ventricle has normal function. The left ventricle has no regional wall motion abnormalities. There is mild concentric left ventricular hypertrophy. Left ventricular diastolic parameters are consistent with Grade I diastolic dysfunction (impaired relaxation). Elevated left atrial pressure.  2. Right ventricular systolic function is normal. The right ventricular size is normal.  3. The mitral valve is normal in structure. No evidence of mitral valve regurgitation. No evidence of mitral stenosis.  4. The aortic valve is normal in structure. Aortic valve regurgitation is not visualized. No aortic stenosis is present.  5. The inferior vena cava is normal in size with greater than 50% respiratory variability, suggesting right atrial pressure of 3 mmHg. Conclusion(s)/Recommendation(s): Discussed with primary team. The left ventricular mass is highly atypical for either thrombus or vegetation and the location is unusual for myxoma or papillary fibroelastoma. Its mobility suggests significant embolic potential. While TEE would offer better resolution, it may not add diagnostic value. Consider cardiac MRI. 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 It measures approximately 13 mm in maximum length and 5 mm in width, with a relatively narrow attachment base. Left ventricular ejection fraction, by estimation, is 60 to 65%. The left ventricle has normal function. The left ventricle has no regional wall motion abnormalities. The left ventricular internal cavity size was normal in size.  There is mild concentric left ventricular hypertrophy. Left ventricular diastolic parameters are consistent with Grade I diastolic dysfunction (impaired relaxation). Elevated left atrial pressure. Right Ventricle: The right ventricular size is normal. No increase in right ventricular wall thickness. Right ventricular systolic function is normal. Left Atrium: Left atrial size was normal in size. Right Atrium: Right atrial size was normal in size. Pericardium:  There is no evidence of pericardial effusion. Mitral Valve: The mitral valve is normal in structure. No evidence of mitral valve regurgitation. No evidence of mitral valve stenosis. Tricuspid Valve: The tricuspid valve is normal in structure. Tricuspid valve regurgitation is not demonstrated. No evidence of tricuspid stenosis. Aortic Valve: The aortic valve is normal in structure. Aortic valve regurgitation is not visualized. No aortic stenosis is present. Aortic valve mean gradient measures 5.0 mmHg. Aortic valve peak gradient measures 8.1 mmHg. Aortic valve area, by VTI measures 1.71 cm. Pulmonic Valve: The pulmonic valve was normal in structure. Pulmonic valve regurgitation is not visualized. No evidence of pulmonic stenosis. Aorta: The aortic root is normal in size and structure. Venous: The inferior vena cava is normal in size with greater than 50% respiratory variability, suggesting right atrial pressure of 3 mmHg. IAS/Shunts: No atrial level shunt detected by color flow Doppler.  LEFT VENTRICLE PLAX 2D LVIDd:         3.60 cm LVIDs:         2.60 cm  Diastology LV PW:         1.20 cm  LV e' medial:    3.97 cm/s LV IVS:        1.50 cm  LV E/e' medial:  16.8 LVOT diam:     1.70 cm  LV e' lateral:   5.35 cm/s LV SV:         49       LV E/e' lateral: 12.5 LV SV Index:   30 LVOT Area:     2.27 cm  RIGHT VENTRICLE RV Basal diam:  3.00 cm LEFT ATRIUM             Index       RIGHT ATRIUM           Index LA diam:        2.10 cm 1.30 cm/m  RA Area:     10.60 cm  LA Vol (A2C):   36.7 ml 22.77 ml/m RA Volume:   21.10 ml  13.09 ml/m LA Vol (A4C):   21.6 ml 13.40 ml/m LA Biplane Vol: 30.1 ml 18.68 ml/m  AORTIC VALVE AV Area (Vmax):    1.65 cm AV Area (Vmean):   1.48 cm AV Area (VTI):     1.71 cm AV Vmax:           142.00 cm/s AV Vmean:          105.000 cm/s AV VTI:            0.285 m AV Peak Grad:      8.1 mmHg AV Mean Grad:      5.0 mmHg LVOT Vmax:         103.00 cm/s LVOT Vmean:        68.300 cm/s LVOT VTI:          0.215 m LVOT/AV VTI ratio: 0.75  AORTA Ao Root diam: 2.40 cm MITRAL VALVE MV Area (PHT): 2.29 cm     SHUNTS MV Decel Time: 332 msec     Systemic VTI:  0.22 m MV E velocity: 66.80 cm/s   Systemic Diam: 1.70 cm MV A velocity: 109.00 cm/s MV E/A ratio:  0.61 Mihai Croitoru MD Electronically signed by Thurmon FairMihai Croitoru MD Signature Date/Time: 12/06/2020/2:42:42 PM    Final    US THYROID  Result Date: 12/06/2020 CLINICAL DATA:  Goiter.  Elevated TSH EXAM: THYROID ULTRASOUND TECHNIQUE: Ultrasound examination of the thyroid gland and adjacent soft tissues  was performed. COMPARISON:  None. FINDINGS: Parenchymal Echotexture: None identified Isthmus: Not visualized Right lobe: Not visualized Left lobe: Not visualized _________________________________________________________ Estimated total number of nodules >/= 1 cm: 0 Number of spongiform nodules >/=  2 cm not described below (TR1): 0 Number of mixed cystic and solid nodules >/= 1.5 cm not described below (TR2): 0 _________________________________________________________ No normal thyroid parenchyma was localized. No regional cervical adenopathy. IMPRESSION: Nonvisualization of thyroid. In the absence of previous thyroidectomy, this would suggest marked parenchymal atrophy. The above is in keeping with the ACR TI-RADS recommendations - J Am Coll Radiol 2017;14:587-595. Electronically Signed   By: Corlis Leak M.D.   On: 12/06/2020 15:53    Cardiac Studies   Echocardiogram 12/06/2020: Impressions: 1. 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 It measures approximately 13 mm in maximum  length and 5 mm in width, with a  relatively narrow attachment base. Left ventricular ejection fraction, by  estimation, is 60 to 65%. The left ventricle has normal function. The left  ventricle has no regional wall motion abnormalities. There is mild  concentric left ventricular  hypertrophy. Left ventricular diastolic parameters are consistent with  Grade I diastolic dysfunction (impaired relaxation). Elevated left atrial  pressure.   2. Right ventricular systolic function is normal. The right ventricular  size is normal.   3. The mitral valve is normal in structure. No evidence of mitral valve  regurgitation. No evidence of mitral stenosis.   4. The aortic valve is normal in structure. Aortic valve regurgitation is  not visualized. No aortic stenosis is present.   5. The inferior vena cava is normal in size with greater than 50%  respiratory variability, suggesting right atrial pressure of 3 mmHg.   Conclusion(s)/Recommendation(s): Discussed with primary team. The left  ventricular mass is highly atypical for either thrombus or vegetation and  the location is unusual for myxoma or papillary fibroelastoma. Its  mobility suggests significant embolic  potential. While TEE would offer better resolution, it may not add  diagnostic value. Consider cardiac MRI.   Patient Profile     75 y.o. female with a history of anemia, borderline hypothyroidism, and remote tobacco abuse (quit in early 2000s) but no known cardiac history who was admitted on 12/05/2020 for altered mental status and concern for acute stroke. Head CT negative for acute stroke but did show chronic appearing lacunar infarct. 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 Atypical for thrombus or vegetation and location also unusual for myxoma or papillary fibroelastoma. However, it's mobility suggests significant embolic potential.  - Patient started on IV Heparin. Continue.  - Per Dr. Jacques Navy, plan is for repeat Echo in several days to see if there is any change in the size or appearance of this linear mobile mass. If no change, will consider cardiac MRI to tissue characterization. Discussed with MD - will repeat limited Echo today.  Altered Mental Status - Initial head CT showed no acute process but did show chronic appearing lacunar  infarct.  - Brain MRI was attempted on ** but unable to complete due to agitation.  - Felt to be due to severe hypothyroidism.  - Management per primary team and Neurology.  Severe Hypothyroidism - TSH 41. - Thyroid ultrasound was unable to visualize the thyroid. In the absence of previous thyroidectomy, this would suggested marked parenchymal atrophy.  - Currently on IV Synthroid. - Management per primary team.  Hyponatremia -Sodium stable at 128 today.  - Management per primary team.  Hypokalemia - Potassium 3.1 today.  - Being supplemented by primary team. - Continue to monitor.  Hyperlipidemia - Continue high-intensity statin.  For questions or updates, please contact [facility] Please consult [email or web address] for contact info under        Signed, Corrin Parker, PA-C  12/08/2020, 1:07 PM    Personally seen and examined. Agree with APP above with the following comments: Briefly 75 yo F with concern for acute stroke.  Seen prior to going to MRI Brain. Patient notes that she feels great and has a lot going  on.  Family is at the bedside.  No agitation presently.   Exam notable for no knocks, gallops, or murmurs.  Poor dentition Personally reviewed relevant tests; abnormal papillary muscle motion, in the A4c view there is a hyperechogenic, highly mobile mass < 1.8 cm X 8 mm.  DDX includes atypical papillary muscle architecture (atypical variant), PFE, thrombus.   Would recommend  - we will repeat the echo today.  I have talked with the family that CMR may not have the spatial resolution to identify the mass.  We will review brain MRI findings and repeat imaging -we have discussed potential CMR 12/09/20; potential TEE this Friday (1st available).    Riley Lam, MD Cardiologist [facility]  [address] [place] [place], [place] [address] [phone]  2:19 PM

## 2020-12-08 NOTE — Progress Notes (Signed)
Pharmacy Antiviral Note  Jane Navarro is a 75 y.o. female admitted on 12/05/2020 with  suspected meningitis .  Pharmacy has been consulted for acyclovir dosing.  Today is day #4 of acyclovir.  HSV result pending.  Plan: Acyclovir 10mg /kg (640mg ) IV q12h  - dose adjusted for renal function -Monitor renal function, clinical status, and treatment plan  Height: 5' (152.4 cm) Weight: 64.2 kg (141 lb 8.6 oz) IBW/kg (Calculated) : 45.5  Temp (24hrs), Avg:98.3 F (36.8 C), Min:97.6 F (36.4 C), Max:99 F (37.2 C)  Recent Labs  Lab 12/05/20 1247 12/05/20 1252 12/05/20 1953 12/06/20 0138 12/07/20 0222 12/08/20 0108  WBC 5.8  --   --  8.1 8.6 6.1  CREATININE 1.22* 1.00  --  0.74 1.15* 1.13*  LATICACIDVEN  --   --  1.3  --   --   --      Estimated Creatinine Clearance: 36.5 mL/min (A) (by C-G formula based on SCr of 1.13 mg/dL (H)).    Allergies  Allergen Reactions   Ampicillin    Ciprofloxacin    Erythromycin    Penicillins    Antimicrobials this admission: Acyclovir 9/3 >>   Microbiology results: 9/3 CSF culture w/ gram stain >>  Thank you for allowing pharmacy to be a part of this patient's care. 11/3, Pharm.D., BCPS Clinical Pharmacist Clinical phone for 12/08/2020 from 8:30-4:00 is [phone].  **Pharmacist phone directory can be found on [email or web address] listed under [facility].  12/08/2020 12:49 PM

## 2020-12-08 NOTE — Progress Notes (Addendum)
ANTICOAGULATION CONSULT NOTE - Follow Up Consult  Pharmacy Consult for heparin Indication:  LV thrombus in setting of possible stroke  Labs: Recent Labs    12/05/20 1247 12/05/20 1252 12/06/20 0138 12/07/20 0222 12/07/20 1156 12/07/20 2307  HGB 10.6* 11.2* 11.8* 11.8*  --   --   HCT 32.6* 33.0* 34.1* 34.4*  --   --   PLT 201  --  174 174  --   --   APTT 30  --   --   --   --   --   LABPROT 13.4  --   --   --   --   --   INR 1.0  --   --   --   --   --   HEPARINUNFRC  --   --   --  0.65 0.55 0.36  CREATININE 1.22* 1.00 0.74 1.15*  --   --      Assessment/Plan:  75yo female therapeutic on heparin after rate change. Will continue infusion at current rate of 550 units/hr and confirm stable with am labs.     Vernard Gambles, PharmD, BCPS  12/08/2020,12:30 AM   Addendum: AM heparin level now slightly below goal at 0.27.  No infusion issues, signs of bleeding, or changes in mental status per RN.  Will increase heparin gtt slightly to 600 units/hr and check level in 8 hours.   VB 3:09 AM

## 2020-12-08 NOTE — Progress Notes (Signed)
ANTICOAGULATION CONSULT NOTE - Follow Up Consult  Pharmacy Consult for Heparin Indication:  r/o stroke, intracardiac mass  Allergies  Allergen Reactions   Ampicillin    Ciprofloxacin    Erythromycin    Penicillins     Patient Measurements: Height: 5' (152.4 cm) Weight: 64.2 kg (141 lb 8.6 oz) IBW/kg (Calculated) : 45.5 Heparin Dosing Weight: 64.2 kg  Vital Signs: Temp: 97.6 F (36.4 C) (09/06 1123) Temp Source: Oral (09/06 1123) BP: 121/86 (09/06 1123) Pulse Rate: 64 (09/06 1123)  Labs: Recent Labs    12/06/20 0138 12/07/20 0222 12/07/20 1156 12/07/20 2307 12/08/20 0108 12/08/20 1133  HGB 11.8* 11.8*  --   --  10.6*  --   HCT 34.1* 34.4*  --   --  31.1*  --   PLT 174 174  --   --  165  --   HEPARINUNFRC  --  0.65   < > 0.36 0.27* 0.26*  CREATININE 0.74 1.15*  --   --  1.13*  --    < > = values in this interval not displayed.    Estimated Creatinine Clearance: 36.5 mL/min (A) (by C-G formula based on SCr of 1.13 mg/dL (H)).   Medications:  Scheduled:   atorvastatin  80 mg Oral Daily   cyanocobalamin  1,000 mcg Intramuscular Daily   levothyroxine  50 mcg Intravenous Daily   sodium chloride flush  3 mL Intravenous Q12H   [START ON 12/13/2020] thiamine injection  100 mg Intravenous Daily   Infusions:   sodium chloride 125 mL/hr at 12/08/20 1214   acyclovir Stopped (12/08/20 0425)   heparin 600 Units/hr (12/08/20 2130)   thiamine injection 250 mg (12/08/20 1034)    Assessment: 75 yo F admitted as a CODE STROKE.  CT head negative for acute process.  Unable to obtain MRI yet due to agitation.  ECHO showed cardiac mass.  Cardiology requested start of heparin with plans for repeat ECHO in a few days to eval mass.  Heparin level essentially unchanged despite rate increase.  Currently on 600 units/hr.  Goal of Therapy:  Heparin level 0.3-0.5 units/ml Monitor platelets by anticoagulation protocol: Yes   Plan:  Increase heparin to 700 units/hr. Check heparin  level in 6 hours. Heparin level and CBC daily while on heparin.  [employer], Pharm.D., BCPS Clinical Pharmacist Clinical phone for 12/08/2020 from 8:30-4:00 is [phone].  **Pharmacist phone directory can be found on [email or web address] listed under [facility].  12/08/2020 12:55 PM

## 2020-12-09 ENCOUNTER — Inpatient Hospital Stay (HOSPITAL_COMMUNITY): Payer: Medicare HMO

## 2020-12-09 DIAGNOSIS — G9341 Metabolic encephalopathy: Secondary | ICD-10-CM | POA: Diagnosis not present

## 2020-12-09 DIAGNOSIS — I6330 Cerebral infarction due to thrombosis of unspecified cerebral artery: Secondary | ICD-10-CM

## 2020-12-09 DIAGNOSIS — E079 Disorder of thyroid, unspecified: Secondary | ICD-10-CM | POA: Diagnosis not present

## 2020-12-09 DIAGNOSIS — I5189 Other ill-defined heart diseases: Secondary | ICD-10-CM | POA: Diagnosis not present

## 2020-12-09 LAB — CBC
HCT: 24 % — ABNORMAL LOW (ref 36.0–46.0)
HCT: 28.7 % — ABNORMAL LOW (ref 36.0–46.0)
Hemoglobin: 8 g/dL — ABNORMAL LOW (ref 12.0–15.0)
Hemoglobin: 9.9 g/dL — ABNORMAL LOW (ref 12.0–15.0)
MCH: 31.5 pg (ref 26.0–34.0)
MCH: 32.5 pg (ref 26.0–34.0)
MCHC: 33.3 g/dL (ref 30.0–36.0)
MCHC: 34.5 g/dL (ref 30.0–36.0)
MCV: 94.5 fL (ref 80.0–100.0)
MCV: 94.1 fL (ref 80.0–100.0)
Platelets: 141 10*3/uL — ABNORMAL LOW (ref 150–400)
Platelets: 147 10*3/uL — ABNORMAL LOW (ref 150–400)
RBC: 2.54 MIL/uL — ABNORMAL LOW (ref 3.87–5.11)
RBC: 3.05 MIL/uL — ABNORMAL LOW (ref 3.87–5.11)
RDW: 14.1 % (ref 11.5–15.5)
RDW: 14 % (ref 11.5–15.5)
WBC: 4.4 10*3/uL (ref 4.0–10.5)
WBC: 5.5 10*3/uL (ref 4.0–10.5)
nRBC: 0 % (ref 0.0–0.2)
nRBC: 0 % (ref 0.0–0.2)

## 2020-12-09 LAB — CSF CULTURE W GRAM STAIN: Culture: NO GROWTH

## 2020-12-09 LAB — GLUCOSE, CAPILLARY: Glucose-Capillary: 94 mg/dL (ref 70–99)

## 2020-12-09 LAB — BASIC METABOLIC PANEL
Anion gap: 6 (ref 5–15)
Anion gap: 7 (ref 5–15)
BUN: 9 mg/dL (ref 8–23)
BUN: 12 mg/dL (ref 8–23)
CO2: 18 mmol/L — ABNORMAL LOW (ref 22–32)
CO2: 21 mmol/L — ABNORMAL LOW (ref 22–32)
Calcium: 6.8 mg/dL — ABNORMAL LOW (ref 8.9–10.3)
Calcium: 8.6 mg/dL — ABNORMAL LOW (ref 8.9–10.3)
Chloride: 87 mmol/L — ABNORMAL LOW (ref 98–111)
Chloride: 104 mmol/L (ref 98–111)
Creatinine, Ser: 0.91 mg/dL (ref 0.44–1.00)
Creatinine, Ser: 1.05 mg/dL — ABNORMAL HIGH (ref 0.44–1.00)
GFR, Estimated: >60 mL/min (ref >60)
GFR, Estimated: 56 mL/min — ABNORMAL LOW (ref >60)
Glucose, Bld: 828 mg/dL (ref 70–99)
Glucose, Bld: 85 mg/dL (ref 70–99)
Potassium: 2.4 mmol/L — CL (ref 3.5–5.1)
Potassium: 3.2 mmol/L — ABNORMAL LOW (ref 3.5–5.1)
Sodium: 111 mmol/L — CL (ref 135–145)
Sodium: 132 mmol/L — ABNORMAL LOW (ref 135–145)

## 2020-12-09 LAB — HEPARIN LEVEL (UNFRACTIONATED)
Heparin Unfractionated: 0.36 IU/mL (ref 0.30–0.70)
Heparin Unfractionated: 0.36 IU/mL (ref 0.30–0.70)

## 2020-12-09 LAB — VITAMIN B1: Vitamin B1 (Thiamine): 196.1 nmol/L (ref 66.5–200.0)

## 2020-12-09 MED ORDER — IOHEXOL 350 MG/ML SOLN
100.0000 mL | Freq: Once | INTRAVENOUS | Status: AC | PRN
  Administered 2020-12-09: 100 mL via INTRAVENOUS

## 2020-12-09 MED ORDER — GADOBUTROL 1 MMOL/ML IV SOLN
10.0000 mL | Freq: Once | INTRAVENOUS | Status: AC | PRN
  Administered 2020-12-09: 10 mL via INTRAVENOUS

## 2020-12-09 MED ORDER — POTASSIUM CHLORIDE 20 MEQ PO PACK
40.0000 meq | PACK | Freq: Once | ORAL | Status: AC
  Administered 2020-12-09: 40 meq via ORAL
  Filled 2020-12-09: qty 2

## 2020-12-09 MED ORDER — HALOPERIDOL LACTATE 5 MG/ML IJ SOLN: 2.0000 mg | Freq: Four times a day (QID) | INTRAMUSCULAR | Status: DC | PRN

## 2020-12-09 MED ORDER — LORAZEPAM 2 MG/ML IJ SOLN: 0.5000 mg | Freq: Once | INTRAMUSCULAR | Status: DC | PRN

## 2020-12-09 MED ORDER — LIP MEDEX EX OINT
TOPICAL_OINTMENT | CUTANEOUS | Status: DC | PRN
  Filled 2020-12-09: qty 7

## 2020-12-09 MED ORDER — POTASSIUM CHLORIDE CRYS ER 20 MEQ PO TBCR: 40.0000 meq | EXTENDED_RELEASE_TABLET | Freq: Once | ORAL | Status: DC

## 2020-12-09 MED ORDER — POTASSIUM CHLORIDE CRYS ER 20 MEQ PO TBCR
20.0000 meq | EXTENDED_RELEASE_TABLET | Freq: Once | ORAL | Status: AC
  Administered 2020-12-09: 20 meq via ORAL
  Filled 2020-12-09: qty 1

## 2020-12-09 NOTE — Progress Notes (Signed)
Physical Therapy Treatment Patient Details Name: Jane Navarro MRN: [id number] DOB: [birth year] Today's Date: 12/09/2020    History of Present Illness [name] is a 75 yo female brought to ED by EMS 9/3 from a [employer] parking lot--found patient with slurred speech, left sided weakness, and confusion.CTH showed no evidence of acute intracranial abnormality, but chronic appearing lacunar infarct within the right caudate nucleus and chronic small vessel ischemic changes. 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 On 9/4 pt with agitation and given Ativan x 3. PMH - kidney stone and rest unknown because she has not been to a provider in years.    PT Comments    Patient progressing this session to in room ambulation to bathroom and to recliner around the bed with RW.  She is still very weak and not safe standing prior to assist ready and sitting on recliner with assist for safety due to LOB.  Patient eager for home with paid caregivers, but still feel STSNF may help initially due to frequency of therapy and no caregivers in place yet.  PT to continue to follow.    Follow Up Recommendations  SNF     Equipment Recommendations  Rolling walker with 5" wheels;3in1 (PT)    Recommendations for Other Services       Precautions / Restrictions Precautions Precautions: Fall    Mobility  Bed Mobility Overal bed mobility: Needs Assistance       Supine to sit: Min assist;HOB elevated     General bed mobility comments: for IV and scooting to EOB    Transfers Overall transfer level: Needs assistance Equipment used: Rolling walker (2 wheeled) Transfers: Sit to/from Stand   Stand pivot transfers: Min assist       General transfer comment: patient eager and standing prior to set up min A for safety and IV  Ambulation/Gait Ambulation/Gait assistance: Min assist;Mod assist Gait Distance (Feet): 12 Feet (&15') Assistive  device: Rolling walker (2 wheeled) Gait Pattern/deviations: Step-to pattern;Step-through pattern;Decreased stride length;Shuffle;Wide base of support;Trunk flexed     General Gait Details: difficulty clearing feet wtih some foot drop noted   Stairs             Wheelchair Mobility    Modified Rankin (Stroke Patients Only)       Balance Overall balance assessment: Needs assistance   Sitting balance-Leahy Scale: Good Sitting balance - Comments: S on EOB and sitting on toilet, able to reach to wipe from the front no LOB   Standing balance support: Bilateral upper extremity supported Standing balance-Leahy Scale: Poor Standing balance comment: UE support and min A for safety                            Cognition Arousal/Alertness: Awake/alert Behavior During Therapy: WFL for tasks assessed/performed Overall Cognitive Status: Impaired/Different from baseline Area of Impairment: Attention                   Current Attention Level: Sustained Memory: Decreased short-term memory Following Commands: Follows one step commands with increased time Safety/Judgement: Decreased awareness of safety   Problem Solving: Slow processing;Requires verbal cues;Requires tactile cues;Difficulty sequencing        Exercises      General Comments General comments (skin integrity, edema, etc.): two sons in the room, pt able to state their names, where they live and reports why she is here and where  she is accurately.  Has tremor in her voice not sure was present PTA      Pertinent Vitals/Pain Pain Assessment: Faces Faces Pain Scale: No hurt    Home Living                      Prior Function            PT Goals (current goals can now be found in the care plan section) Progress towards PT goals: Progressing toward goals    Frequency    Min 3X/week      PT Plan Current plan remains appropriate    Co-evaluation              AM-PAC PT "6  Clicks" Mobility   Outcome Measure  Help needed turning from your back to your side while in a flat bed without using bedrails?: A Lot Help needed moving from lying on your back to sitting on the side of a flat bed without using bedrails?: A Lot Help needed moving to and from a bed to a chair (including a wheelchair)?: A Lot Help needed standing up from a chair using your arms (e.g., wheelchair or bedside chair)?: A Little Help needed to walk in hospital room?: A Lot Help needed climbing 3-5 steps with a railing? : A Lot 6 Click Score: 13    End of Session   Activity Tolerance: Patient limited by fatigue Patient left: in chair;with call bell/phone within reach;with chair alarm set;with family/visitor present   PT Visit Diagnosis: Unsteadiness on feet (R26.81);Other abnormalities of gait and mobility (R26.89)     Time: 1345-1415 PT Time Calculation (min) (ACUTE ONLY): 30 min  Charges:  $Gait Training: 8-22 mins $Therapeutic Activity: 8-22 mins                     Sheran Lawless, PT Acute Rehabilitation Services [phone] Office:[phone] 12/09/2020    [name] 12/09/2020, 3:02 PM

## 2020-12-09 NOTE — Progress Notes (Signed)
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 On 9/4 pt with agitation and given Ativan x 3. PMH - kidney stone and rest unknown because she has not been to a provider in years.   OT comments  Jane Navarro is progressing well, her son Jane Navarro was present this session. Jane Navarro required min guard-min A for all functional ambulation this session with RW for balance and steadying. She demonstrates a shorter RLE step length and requires vc to improve and decrease her fall risk. Pt required increased verbal cues for safety, sequencing and problem solving when attempting to multi-task (ex. toileting and talking). Pt also shared that she has vertigo and blurry vision. Pt continues to benefit from continued OT. Pt would benefit from short SNF stay prior to d/c home as she is a fall risk and requires mod-max verbal cues for safety.    Follow Up Recommendations  SNF;Supervision/Assistance - 24 hour    Equipment Recommendations  3 in 1 bedside commode       Precautions / Restrictions Precautions Precautions: Fall Precaution Comments: Short R step length, lending to imbalance and RW too far forward Restrictions Weight Bearing Restrictions: No       Mobility Bed Mobility Overal bed mobility: Needs Assistance       Supine to sit: Min assist;HOB elevated     General bed mobility comments: pt in chair at arrival    Transfers Overall transfer level: Needs  assistance Equipment used: Rolling walker (2 wheeled) Transfers: Sit to/from Stand Sit to Stand: Min assist Stand pivot transfers: Min assist       General transfer comment: min guard - min A fro sit<>stands this session; pt with poor safety awareness and attempted to sit on both toilet and chair before close enough    Balance Overall balance assessment: Needs assistance   Sitting balance-Leahy Scale: Good Sitting balance - Comments: sitting on edge of chiar upon arrival   Standing balance support: Bilateral upper extremity supported Standing balance-Leahy Scale: Poor Standing balance comment: UE support and min A for safety                           ADL either performed or assessed with clinical judgement   ADL Overall ADL's : Needs assistance/impaired                         Toilet Transfer: Minimal assistance;RW;Ambulation;Comfort height toilet;Grab bars Toilet Transfer Details (indicate cue type and reason): min A for safety verbal cues and powering up from the toilet Toileting- Clothing Manipulation and Hygiene: Min guard;Sitting/lateral lean Toileting - Clothing Manipulation Details (indicate cue type and reason): pt was able to wipe while sitting, however became slightly unsteady when leaning to reach her perineal area - able to self correct with min guard     Functional mobility during ADLs: Minimal assistance;Rolling walker General ADL Comments: pt walked from recliner>bathroom>recliner without rest break. She required min A intermittently for balance and stability. multi-tasking (  ex talking & toileting) was very difficult for her     Vision       Perception     Praxis      Cognition Arousal/Alertness: Awake/alert Behavior During Therapy: WFL for tasks assessed/performed Overall Cognitive Status: Impaired/Different from baseline Area of Impairment: Attention                   Current Attention Level: Sustained Memory: Decreased  short-term memory Following Commands: Follows one step commands with increased time Safety/Judgement: Decreased awareness of safety   Problem Solving: Slow processing;Requires verbal cues;Requires tactile cues General Comments: When asked about pt's vision, she began talking about her teeth. When reminding her of the vision question, she answered and started talking about her vertigo        Exercises     Shoulder Instructions       General Comments Son, Jane Navarro present and supportive this session    Pertinent Vitals/ Pain       Pain Assessment: No/denies pain Pain Score: 0-No pain Faces Pain Scale: No hurt Pain Intervention(s): Monitored during session   Frequency  Min 2X/week        Progress Toward Goals  OT Goals(current goals can now be found in the care plan section)     Acute Rehab OT Goals Patient Stated Goal: to get up and walk OT Goal Formulation: With patient Time For Goal Achievement: 12/20/20 Potential to Achieve Goals: Good ADL Goals Pt Will Perform Grooming: Independently;standing Pt Will Perform Upper Body Bathing: Independently;sitting;standing Pt Will Perform Lower Body Bathing: Independently;sit to/from stand Pt Will Perform Upper Body Dressing: Independently;sitting;standing Pt Will Perform Lower Body Dressing: Independently;sit to/from stand Pt Will Transfer to Toilet: Independently;ambulating;regular height toilet;grab bars Pt Will Perform Toileting - Clothing Manipulation and hygiene: Independently;sit to/from stand Additional ADL Goal #1: Pt will be independent in and OOB for basic ADLs  Plan Discharge plan remains appropriate    Co-evaluation                 AM-PAC OT "6 Clicks" Daily Activity     Outcome Measure   Help from another person eating meals?: A Little Help from another person taking care of personal grooming?: A Little Help from another person toileting, which includes using toliet, bedpan, or urinal?: A Little Help  from another person bathing (including washing, rinsing, drying)?: A Little Help from another person to put on and taking off regular upper body clothing?: A Little Help from another person to put on and taking off regular lower body clothing?: A Little 6 Click Score: 18    End of Session Equipment Utilized During Treatment: Gait belt;Rolling walker  OT Visit Diagnosis: Unsteadiness on feet (R26.81);Other abnormalities of gait and mobility (R26.89);Muscle weakness (generalized) (M62.81);Other symptoms and signs involving cognitive function;Pain Pain - Right/Left: Left Pain - part of body: Knee   Activity Tolerance Patient tolerated treatment well   Patient Left in chair;with call bell/phone within reach;with chair alarm set   Nurse Communication Mobility status        Time: 1640-1700 OT Time Calculation (min): 20 min  Charges: OT General Charges $OT Visit: 1 Visit OT Treatments $Self Care/Home Management : 8-22 mins    Rollo Farquhar A Kellyjo Edgren 12/09/2020, 5:26 PM

## 2020-12-09 NOTE — Progress Notes (Signed)
This RN received a call from lab reporting multiple critical labs.  K+ 2.4, Na+ 111, Glucose 828.  These results were not consistent with the patient's previous labs so a finger stick glucose was obtained which resulted in a CBG of 94.  At this time lab was called and orders were placed to redraw the BMET and CBC.  MD B. Chotiner notified and in agreement with plan to wait for results of lab redraw before initiating treatment.  Awaiting those results at present.  Plan in place to follow up with MD upon posting of new lab results.  Will continue to monitor.

## 2020-12-09 NOTE — Progress Notes (Signed)
STROKE TEAM PROGRESS NOTE   SUBJECTIVE (INTERVAL HISTORY) Her two sons are at the bedside.  Overall her condition is gradually improving. Pt still dysarthric but awake alert and oriented to place people and time, but told me her age "75". No focal deficit. 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    OBJECTIVE Temp:  [97.6 F (36.4 C)-98.5 F (36.9 C)] 97.9 F (36.6 C) (09/07 1243) Pulse Rate:  [63-70] 68 (09/07 1243) Cardiac Rhythm: Normal sinus rhythm (09/07 0814) Resp:  [14-19] 18 (09/07 1243) BP: (140-158)/(80-99) 158/99 (09/07 1243) SpO2:  [94 %-100 %] 100 % (09/07 1243)  Recent Labs  Lab 12/05/20 1248 12/09/20 0451  GLUCAP 110* 94   Recent Labs  Lab 12/06/20 0138 12/07/20 0222 12/08/20 0108 12/09/20 0345 12/09/20 0501  NA 127* 128* 128* 111* 132*  K 3.8 3.7 3.1* 2.4* 3.2*  CL 96* 96* 99 87* 104  CO2 22 23 20* 18* 21*  GLUCOSE 111* 83 73 828* 85  BUN 11 13 16 9 12   CREATININE 0.74 1.15* 1.13* 0.91 1.05*  CALCIUM 9.3 9.5 8.9 6.8* 8.6*  MG  --  2.2  --   --   --   PHOS  --  3.0  --   --   --    Recent Labs  Lab 12/05/20 1247 12/06/20 0138 12/07/20 0222 12/08/20 0108  AST 68* 90* 109* 86*  ALT 22 26 26 21   ALKPHOS 66 72 76 68  BILITOT 0.4 0.5 0.6 0.6  PROT 7.2 7.5 7.5 6.3*  ALBUMIN 4.0 4.1 3.9 3.3*   Recent Labs  Lab 12/05/20 1247 12/05/20 1252 12/06/20 0138 12/07/20 0222 12/08/20 0108 12/09/20 0345 12/09/20 0501  WBC 5.8  --  8.1 8.6 6.1 4.4 5.5  NEUTROABS 3.9  --  7.1  --   --   --   --   HGB 10.6*   < > 11.8* 11.8* 10.6* 8.0* 9.9*  HCT 32.6*   < > 34.1* 34.4* 31.1* 24.0* 28.7*  MCV 97.3  --  93.2 93.2 94.0 94.5 94.1  PLT 201  --  174 174 165 141* 147*   < > = values in this interval not displayed.   No results for input(s): CKTOTAL, CKMB, CKMBINDEX, TROPONINI in the last 168 hours. No results for input(s): LABPROT, INR in the last 72 hours. No results for input(s):  COLORURINE, LABSPEC, PHURINE, GLUCOSEU, HGBUR, BILIRUBINUR, KETONESUR, PROTEINUR, UROBILINOGEN, NITRITE, LEUKOCYTESUR in the last 72 hours.  Invalid input(s): APPERANCEUR     Component Value Date/Time   CHOL 429 (H) 12/06/2020 0138   TRIG 258 (H) 12/06/2020 0138   HDL 50 12/06/2020 0138   CHOLHDL 8.6 12/06/2020 0138   VLDL 52 (H) 12/06/2020 0138   LDLCALC 327 (H) 12/06/2020 0138   Lab Results  Component Value Date   HGBA1C 5.5 12/05/2020      Component Value Date/Time   LABOPIA NONE DETECTED 12/05/2020 1600   COCAINSCRNUR NONE DETECTED 12/05/2020 1600   LABBENZ NONE DETECTED 12/05/2020 1600   AMPHETMU NONE DETECTED 12/05/2020 1600   THCU NONE DETECTED 12/05/2020 1600   LABBARB NONE DETECTED 12/05/2020 1600    Recent Labs  Lab 12/05/20 1957  ETH <10    I have personally reviewed the radiological images below and agree with the radiology interpretations.  MR BRAIN WO CONTRAST  Result Date: 12/08/2020 CLINICAL DATA:  Mental status change, unknown cause. EXAM: MRI HEAD  WITHOUT CONTRAST TECHNIQUE: Multiplanar, multiecho pulse sequences of the brain and surrounding structures were obtained without intravenous contrast. COMPARISON:  Head CT 12/05/2020 FINDINGS: The study is mildly motion degraded. Brain: There is a 1 cm acute infarct involving the mid to posterior aspect of the left cingulate gyrus. Patchy T2 hyperintensities in the cerebral white matter bilaterally are nonspecific but compatible with moderate chronic small vessel ischemic disease. There is a chronic lacunar infarct involving the body of the right caudate nucleus. A chronic microhemorrhage is noted in the region of the left external capsule. There is mild-to-moderate cerebral atrophy. No mass, midline shift, or extra-axial fluid collection is identified. Vascular: Major intracranial vascular flow voids are preserved. Skull and upper cervical spine: Unremarkable bone marrow signal. Sinuses/Orbits: Unremarkable orbits.  Minimal bilateral ethmoid air cell mucosal thickening. Trace left mastoid fluid. Other: None. IMPRESSION: 1. Small acute infarct in the left cingulate gyrus. 2. Moderate chronic small vessel ischemic disease. Electronically Signed   By: Sebastian AcheAllen  Grady M.D.   On: 12/08/2020 18:40   ECHOCARDIOGRAM COMPLETE  Result Date: 12/06/2020    ECHOCARDIOGRAM REPORT   Patient Name:   Joanna PuffJUDY Hynes Date of Exam: 12/06/2020 Medical Rec #:  [id number]    Height:       60.0 in Accession #:    [id number]   Weight:       141.5 lb Date of Birth:  07/16/1945    BSA:          1.612 m Patient Age:    75 years     BP:           150/92 mmHg Patient Gender: F            HR:           72 bpm. Exam Location:  Inpatient Procedure: 2D Echo, Cardiac Doppler and Color Doppler Indications:    Stroke I63.9  History:        Patient has no prior history of Echocardiogram examinations.                 Signs/Symptoms:Altered Mental Status; Risk Factors:Former                 Smoker. Hypertensive urgency. Hypothyroidism. Passed out in                 parking lot after shopping.  Sonographer:    Roosvelt Maserachel Lane RDCS Referring Phys: [id number] Beather ArbourKAREN J [facility] IMPRESSIONS  1. 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 It measures approximately 13 mm in maximum length and 5 mm in width, with a relatively narrow attachment base. Left ventricular ejection fraction, by estimation, is 60 to 65%. The left ventricle has normal function. The left ventricle has no regional wall motion abnormalities. There is mild concentric left ventricular hypertrophy. Left ventricular diastolic parameters are consistent with Grade I diastolic dysfunction (impaired relaxation). Elevated left atrial pressure.  2. Right ventricular systolic function is normal. The right ventricular size is normal.  3. The mitral valve is normal in structure. No evidence of mitral  valve regurgitation. No evidence of mitral stenosis.  4. The aortic valve is normal in structure. Aortic valve regurgitation is not visualized. No aortic stenosis is present.  5. The inferior vena cava is normal in size with greater than 50% respiratory variability, suggesting right atrial pressure of 3 mmHg. Conclusion(s)/Recommendation(s): Discussed with primary team. The  left ventricular mass is highly atypical for either thrombus or vegetation and the location is unusual for myxoma or papillary fibroelastoma. Its mobility suggests significant embolic potential. While TEE would offer better resolution, it may not add diagnostic value. Consider cardiac MRI. 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 It measures approximately 13 mm in maximum length and 5 mm in width, with a relatively narrow attachment base. Left ventricular ejection fraction, by estimation, is 60 to 65%. The left ventricle has normal function. The left ventricle has no regional wall motion abnormalities. The left ventricular internal cavity size was normal in size. There is mild concentric left ventricular hypertrophy. Left ventricular diastolic parameters are consistent with Grade I diastolic dysfunction (impaired relaxation). Elevated left atrial pressure. Right Ventricle: The right ventricular size is normal. No increase in right ventricular wall thickness. Right ventricular systolic function is normal. Left Atrium: Left atrial size was normal in size. Right Atrium: Right atrial size was normal in size. Pericardium: There is no evidence of pericardial effusion. Mitral Valve: The mitral valve is normal in structure. No evidence of mitral valve regurgitation. No evidence of mitral valve stenosis. Tricuspid Valve: The tricuspid valve is normal in structure. Tricuspid valve regurgitation is not  demonstrated. No evidence of tricuspid stenosis. Aortic Valve: The aortic valve is normal in structure. Aortic valve regurgitation is not visualized. No aortic stenosis is present. Aortic valve mean gradient measures 5.0 mmHg. Aortic valve peak gradient measures 8.1 mmHg. Aortic valve area, by VTI measures 1.71 cm. Pulmonic Valve: The pulmonic valve was normal in structure. Pulmonic valve regurgitation is not visualized. No evidence of pulmonic stenosis. Aorta: The aortic root is normal in size and structure. Venous: The inferior vena cava is normal in size with greater than 50% respiratory variability, suggesting right atrial pressure of 3 mmHg. IAS/Shunts: No atrial level shunt detected by color flow Doppler.  LEFT VENTRICLE PLAX 2D LVIDd:         3.60 cm LVIDs:         2.60 cm  Diastology LV PW:         1.20 cm  LV e' medial:    3.97 cm/s LV IVS:        1.50 cm  LV E/e' medial:  16.8 LVOT diam:     1.70 cm  LV e' lateral:   5.35 cm/s LV SV:         49       LV E/e' lateral: 12.5 LV SV Index:   30 LVOT Area:     2.27 cm  RIGHT VENTRICLE RV Basal diam:  3.00 cm LEFT ATRIUM             Index       RIGHT ATRIUM           Index LA diam:        2.10 cm 1.30 cm/m  RA Area:     10.60 cm LA Vol (A2C):   36.7 ml 22.77 ml/m RA Volume:   21.10 ml  13.09 ml/m LA Vol (A4C):   21.6 ml 13.40 ml/m LA Biplane Vol: 30.1 ml 18.68 ml/m  AORTIC VALVE AV Area (Vmax):    1.65 cm AV Area (Vmean):   1.48 cm AV Area (VTI):     1.71 cm AV Vmax:           142.00  cm/s AV Vmean:          105.000 cm/s AV VTI:            0.285 m AV Peak Grad:      8.1 mmHg AV Mean Grad:      5.0 mmHg LVOT Vmax:         103.00 cm/s LVOT Vmean:        68.300 cm/s LVOT VTI:          0.215 m LVOT/AV VTI ratio: 0.75  AORTA Ao Root diam: 2.40 cm MITRAL VALVE MV Area (PHT): 2.29 cm     SHUNTS MV Decel Time: 332 msec     Systemic VTI:  0.22 m MV E velocity: 66.80 cm/s   Systemic Diam: 1.70 cm MV A velocity: 109.00 cm/s MV E/A ratio:  0.61 Mihai Croitoru MD  Electronically signed by Thurmon Fair MD Signature Date/Time: 12/06/2020/2:42:42 PM    Final    US THYROID  Result Date: 12/06/2020 CLINICAL DATA:  Goiter.  Elevated TSH EXAM: THYROID ULTRASOUND TECHNIQUE: Ultrasound examination of the thyroid gland and adjacent soft tissues was performed. COMPARISON:  None. FINDINGS: Parenchymal Echotexture: None identified Isthmus: Not visualized Right lobe: Not visualized Left lobe: Not visualized _________________________________________________________ Estimated total number of nodules >/= 1 cm: 0 Number of spongiform nodules >/=  2 cm not described below (TR1): 0 Number of mixed cystic and solid nodules >/= 1.5 cm not described below (TR2): 0 _________________________________________________________ No normal thyroid parenchyma was localized. No regional cervical adenopathy. IMPRESSION: Nonvisualization of thyroid. In the absence of previous thyroidectomy, this would suggest marked parenchymal atrophy. The above is in keeping with the ACR TI-RADS recommendations - J Am Coll Radiol 2017;14:587-595. Electronically Signed   By: Corlis Leak M.D.   On: 12/06/2020 15:53   CT HEAD CODE STROKE WO CONTRAST  Result Date: 12/05/2020 CLINICAL DATA:  Code stroke. Neuro deficit, acute, stroke suspected. EXAM: CT HEAD WITHOUT CONTRAST TECHNIQUE: Contiguous axial images were obtained from the base of the skull through the vertex without intravenous contrast. COMPARISON:  Report from head CT 11/21/1998 (images currently unavailable). FINDINGS: Brain: Cerebral volume is normal for age. 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 Background mild multifocal T2/FLAIR hyperintensity within the cerebral white matter, nonspecific but compatible with chronic small vessel ischemic disease. There is no acute intracranial hemorrhage. No demarcated cortical infarct. No extra-axial fluid collection. No evidence of an intracranial mass. No midline shift.  Vascular: No hyperdense vessel.  Atherosclerotic calcifications. Skull: Normal. Negative for fracture or focal lesion. Sinuses/Orbits: Visualized orbits show no acute finding. No significant paranasal sinus disease. ASPECTS [facility] Stroke Program Early CT Score) - Ganglionic level infarction (caudate, lentiform nuclei, internal capsule, insula, M1-M3 cortex): 7 - Supraganglionic infarction (M4-M6 cortex): 3 Total score (0-10 with 10 being normal): 10 (when discounting chronic infarcts). These results were communicated to Dr. Selina Cooley At 1:18 pmon 9/3/2022by text page via the [facility] messaging system. IMPRESSION: No evidence of acute intracranial abnormality. 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 Background mild chronic small vessel ischemic changes within the cerebral white matter. Electronically Signed   By: Jackey Loge D.O.   On: 12/05/2020 13:18   ECHOCARDIOGRAM LIMITED  Result Date: 12/08/2020    ECHOCARDIOGRAM LIMITED REPORT   Patient Name:   CARMALETA YOUNGERS Date of Exam: 12/08/2020 Medical Rec #:  [id number]    Height:       60.0 in Accession #:    [id number]  Weight:       141.5 lb Date of Birth:  1945-09-01    BSA:          1.612 m Patient Age:    58 years     BP:           121/86 mmHg Patient Gender: F            HR:           66 bpm. Exam Location:  Inpatient Procedure: Limited Echo, Limited Color Doppler and Cardiac Doppler Indications:    Reassess LV mass  History:        Patient has prior history of Echocardiogram examinations, most                 recent 12/06/2020. Signs/Symptoms:Altered Mental Status; Risk                 Factors:Current Smoker.  Sonographer:    Delcie Roch RDCS Referring Phys: [id number] CALLIE E GOODRICH IMPRESSIONS  1. 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 It may be slightly smaller in size but is essentially unchanged from previous study. Kristeen Miss MD Electronically signed by Kristeen Miss MD Signature Date/Time: 12/08/2020/4:01:37 PM    Final       PHYSICAL EXAM  Temp:  [97.6 F (36.4 C)-98.5 F (36.9 C)] 97.9 F (36.6 C) (09/07 1243) Pulse Rate:  [63-70] 68 (09/07 1243) Resp:  [14-19] 18 (09/07 1243) BP: (140-158)/(80-99) 158/99 (09/07 1243) SpO2:  [94 %-100 %] 100 % (09/07 1243)  General - Well nourished, well developed, in no apparent distress.  Ophthalmologic - fundi not visualized due to noncooperation.  Cardiovascular - Regular rhythm and rate.  Mental Status -  Level of arousal and orientation to time, place, and person were intact, however, not orientated to age, told me her age "38" Language including expression, naming, comprehension was assessed and found intact, repeating Ok with simple sentences but not complex sentences. Moderate dysarthria Fund of Knowledge was assessed and was intact.  Cranial Nerves II - XII - II - Visual field intact OU. III, IV, VI - Extraocular movements intact. V - Facial sensation intact bilaterally. VII - Facial movement intact bilaterally. VIII - Hearing & vestibular intact bilaterally. X - Palate elevates symmetrically. Moderate dysarthria, poor denture. XI - Chin turning & shoulder shrug intact bilaterally. XII - Tongue protrusion intact.  Motor Strength - The patient's strength was symmetrical in all extremities and pronator drift was absent.  Bulk was normal and fasciculations were absent.   Motor Tone - Muscle tone was assessed at the neck and appendages and was normal.  Reflexes - The patient's reflexes were symmetrical in all extremities and she had no pathological reflexes.  Sensory - Light touch, temperature/pinprick were assessed and were symmetrical.    Coordination - The patient had grossly normal movements in the hands with no ataxia or dysmetria.  Tremor was absent.  Gait and Station - deferred.   ASSESSMENT/PLAN Ms. Cyani Kallstrom is a 75 y.o. female with history of kidney stone, and not regularly following with doctors admitted for confusion, slurry  speech, left side weakness and low BP. No tPA given due to unclear time onset. Found to have encephalopathy due to severe hypothyroidism and hyperlipidemia.     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 Patient counseled to be compliant with her antithrombotic medications Ongoing aggressive stroke risk factor management Therapy recommendations:  pending Disposition:  pending  Hypothyroidism TSH = 41 Thyroid US Nonvisualization of thyroid. In the absence of previous thyroidectomy, this would suggest marked parenchymal atrophy. On synthroid Management per primary team  Hypertension Stable Long term BP goal normotensive  Hyperlipidemia Home meds:  none  LDL 327, goal < 70 Now on lipitor 80 Continue statin at discharge  Other Stroke Risk Factors Advanced age Hx of ETOH abuse  Other Active Problems B12 deficiency - on supplement Mild hyponatremia - Na 132  Hospital day # 4   Marvel Plan, MD PhD Stroke Neurology 12/09/2020 2:02 PM    To contact Stroke Continuity provider, please refer to [email or web address]. After hours, contact General Neurology

## 2020-12-09 NOTE — Progress Notes (Signed)
ANTICOAGULATION CONSULT NOTE - Follow Up Consult  Pharmacy Consult for heparin Indication:  LV thrombus  (Initially suspected stroke)  Labs: Recent Labs    12/08/20 0108 12/08/20 1133 12/08/20 2146 12/09/20 0345 12/09/20 0501 12/09/20 0743  HGB 10.6*  --   --  8.0* 9.9*  --   HCT 31.1*  --   --  24.0* 28.7*  --   PLT 165  --   --  141* 147*  --   HEPARINUNFRC 0.27* 0.26* 0.21*  --   --  0.36  CREATININE 1.13*  --   --  0.91 1.05*  --      Assessment: 75yo female now therapeutic on heparin; no infusion issues or signs of bleeding noted. Plan for repeat ECHO in a few days. Unable to complete brain MRI, Initial head CT negative for acute stroke but did show chronic appearing lacunar infarct.  Goal of Therapy:  Heparin level 0.3-0.5 units/ml   Plan:  Continue heparin infusion at 850 units/hr Recheck anti-Xa level in 8 hours and daily while on heparin Continue to monitor H&H and platelets      Thank you for allowing [place] to participate in this patients care. Signe Colt, PharmD 12/09/2020 8:36 AM  Please check [email or web address] for unit-specific pharmacy phone numbers.

## 2020-12-09 NOTE — Progress Notes (Signed)
PROGRESS NOTE    Jane Navarro  PPI:[id number] DOB: 1945/09/07 DOA: 12/05/2020 PCP: Brooke Bonito, MD   Brief Narrative: 75 year old past medical history significant for anemia, tobacco use in the past, presents into the hospital after being found altered in her car in the [employer] driving a lot.  She apparently went in, did grocery shopping and then got back into her car and passed out.  EMS was called and she was brought to the hospital.  Family spoke to her 6 days prior to admission and she seems to be in her normal state of health.  Patient does not have a PCP and does not follows with doctors.  On admission she underwent an LP and was placed on empiric acyclovir.  She was also found to be hypothyroid and also have an intracardiac mobile mass.  Neurology and cardiology consulted and following.   Assessment & Plan:   Principal Problem:   Acute metabolic encephalopathy Active Problems:   Hypertensive urgency   CKD (chronic kidney disease), stage III (HCC)   Normocytic anemia   Transaminitis   1-Severe Hypothyroidism: Patient presented with altered mental status, TSH 41.4. -Thyroid ultrasound was unable to visualize the thyroid, in the absence of previous thyroidectomy this will suggest marked parenchymal atrophy. -Continue with IV Synthroid, will consider transition to oral tomorrow if she consistently is taking oral. -She will need repeat TSH in 3 to 4 weeks.  2-Acute Metabolic Encephalopathy:  -Likely related to untreated hypothyroidism.  Also Sundowning, mimicking dementia but is likely induced by untreated prolonged hypothyroidism. -LP: CSF no growth on culture.  HSV PCR CSF negative.  IV acyclovir has been discontinued. -MRI showed acute small stroke left cingulate gyrus. -Neurology inform of MRI  results.  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 -Cardiology following. -Blood cultures no growth to date. -Currently on heparin drip. -Awaiting cardiac MRI results might need TEE  4-Hypokalemia: Replete orally 5-Lipidemia LDL 327.  Continue with statin 6-probably essential hypertension: She is hypertensive at the time that she is agitated.  Normotensive otherwise.  Abdominal distention: Proceed with KUB.  If KUB unrevealing we will proceed with limited ultrasound  B12 deficiency: Continue with supplements CKD stage IIIa: Creatinine at baseline Hyponatremia: Likely hypothyroidism induced. Hypokalemia; replete orally.  Transaminases: Pattern that raises suspicious for EtOH use.  Patient used to drink in the past, family is not aware that she is currently drinking. Stable on statins.     Estimated body mass index is 27.64 kg/m as calculated from the following:   Height as of this encounter: 5' (1.524 m).   Weight as of this encounter: 64.2 kg.   DVT prophylaxis: On heparin drip Code Status: Full code Family Communication: Sons at bedside Disposition Plan:  Status is: Inpatient  Remains inpatient appropriate because:IV treatments appropriate due to intensity of illness or inability to take PO  Dispo: The patient is from: Home              Anticipated d/c is to: SNF              Patient currently is not medically stable to d/c.   Difficult to place patient No        Consultants:  Neurology Cardiology  Procedures:  Echo: Showed mobile mass  Antimicrobials:  Subjective: Alert and oriented to place and person.  She denies abdominal pain.  She is passing gas.  Her abdomen is distended, she has had distended abdomen for a while.  Objective: Vitals:   12/08/20 2000 12/09/20 0005 12/09/20 0437 12/09/20 0717  BP: (!) 141/80 (!) 155/91 (!) 140/96 (!) 155/94  Pulse: 63 67 70 66  Resp: 19 16  16   Temp: 97.6 F (36.4 C) 98.1 F (36.7 C)  98.4 F (36.9 C) 98.5 F (36.9 C)  TempSrc: Oral Oral Oral Oral  SpO2: 94% 95% 95% 97%  Weight:      Height:        Intake/Output Summary (Last 24 hours) at 12/09/2020 0758 Last data filed at 12/09/2020 0100 Gross per 24 hour  Intake 4309.52 ml  Output 400 ml  Net 3909.52 ml   Filed Weights   12/05/20 1200  Weight: 64.2 kg    Examination:  General exam: Appears calm and comfortable  Respiratory system: Clear to auscultation. Respiratory effort normal. Cardiovascular system: S1 & S2 heard, RRR.  Gastrointestinal system: Abdomen is distended, soft and nontender.  Central nervous system: Alert and oriented times 2, confused Extremities: Symmetric 5 x 5 power.   Data Reviewed: I have personally reviewed following labs and imaging studies  CBC: Recent Labs  Lab 12/05/20 1247 12/05/20 1252 12/06/20 0138 12/07/20 0222 12/08/20 0108 12/09/20 0345 12/09/20 0501  WBC 5.8  --  8.1 8.6 6.1 4.4 5.5  NEUTROABS 3.9  --  7.1  --   --   --   --   HGB 10.6*   < > 11.8* 11.8* 10.6* 8.0* 9.9*  HCT 32.6*   < > 34.1* 34.4* 31.1* 24.0* 28.7*  MCV 97.3  --  93.2 93.2 94.0 94.5 94.1  PLT 201  --  174 174 165 141* 147*   < > = values in this interval not displayed.   Basic Metabolic Panel: Recent Labs  Lab 12/06/20 0138 12/07/20 0222 12/08/20 0108 12/09/20 0345 12/09/20 0501  NA 127* 128* 128* 111* 132*  K 3.8 3.7 3.1* 2.4* 3.2*  CL 96* 96* 99 87* 104  CO2 22 23 20* 18* 21*  GLUCOSE 111* 83 73 828* 85  BUN 11 13 16 9 12   CREATININE 0.74 1.15* 1.13* 0.91 1.05*  CALCIUM 9.3 9.5 8.9 6.8* 8.6*  MG  --  2.2  --   --   --   PHOS  --  3.0  --   --   --    GFR: Estimated Creatinine Clearance: 39.3 mL/min (A) (by C-G formula based on SCr of 1.05 mg/dL (H)). Liver Function Tests: Recent Labs  Lab 12/05/20 1247 12/06/20 0138 12/07/20 0222 12/08/20 0108  AST 68* 90* 109* 86*  ALT 22 26 26 21   ALKPHOS 66 72 76 68  BILITOT 0.4 0.5 0.6 0.6  PROT 7.2 7.5 7.5 6.3*  ALBUMIN 4.0  4.1 3.9 3.3*   No results for input(s): LIPASE, AMYLASE in the last 168 hours. Recent Labs  Lab 12/05/20 1952  AMMONIA 13   Coagulation Profile: Recent Labs  Lab 12/05/20 1247  INR 1.0   Cardiac Enzymes: No results for input(s): CKTOTAL, CKMB, CKMBINDEX, TROPONINI in the last 168 hours. BNP (last 3 results) No results for input(s): PROBNP in the last 8760 hours. HbA1C: No results for input(s): HGBA1C in the last 72 hours. CBG: Recent Labs  Lab 12/05/20 1248 12/09/20 0451  GLUCAP 110* 94   Lipid Profile: No results for input(s):  CHOL, HDL, LDLCALC, TRIG, CHOLHDL, LDLDIRECT in the last 72 hours. Thyroid Function Tests: No results for input(s): TSH, T4TOTAL, FREET4, T3FREE, THYROIDAB in the last 72 hours. Anemia Panel: Recent Labs    12/07/20 0222  FOLATE 7.8  TIBC 314  IRON 45   Sepsis Labs: Recent Labs  Lab 12/05/20 1953  LATICACIDVEN 1.3    Recent Results (from the past 240 hour(s))  CSF culture w Stat Gram Stain     Status: None (Preliminary result)   Collection Time: 12/05/20  3:50 PM   Specimen: CSF; Cerebrospinal Fluid  Result Value Ref Range Status   Specimen Description CSF  Final   Special Requests NONE  Final   Gram Stain   Final    WBC PRESENT, PREDOMINANTLY MONONUCLEAR NO ORGANISMS SEEN CYTOSPIN SMEAR    Culture   Final    NO GROWTH 3 DAYS Performed at [facility], [address], [place], [place] [address]    Report Status PENDING  Incomplete  SARS CORONAVIRUS 2 (TAT 6-24 HRS) Nasopharyngeal Nasopharyngeal Swab     Status: None   Collection Time: 12/05/20  6:47 PM   Specimen: Nasopharyngeal Swab  Result Value Ref Range Status   SARS Coronavirus 2 NEGATIVE NEGATIVE Final    Comment: (NOTE) SARS-CoV-2 target nucleic acids are NOT DETECTED.  The SARS-CoV-2 RNA is generally detectable in upper and lower respiratory specimens during the acute phase of infection. Negative results do not preclude SARS-CoV-2 infection, do not rule  out co-infections with other pathogens, and should not be used as the sole basis for treatment or other patient management decisions. Negative results must be combined with clinical observations, patient history, and epidemiological information. The expected result is Negative.  Fact Sheet for Patients: HairSlick.no  Fact Sheet for Healthcare Providers: quierodirigir.com  This test is not yet approved or cleared by the [place] FDA and  has been authorized for detection and/or diagnosis of SARS-CoV-2 by FDA under an Emergency Use Authorization (EUA). This EUA will remain  in effect (meaning this test can be used) for the duration of the COVID-19 declaration under Se ction 564(b)(1) of the Act, 21 U.S.C. section 360bbb-3(b)(1), unless the authorization is terminated or revoked sooner.  Performed at [facility], [address] [place], [place] [address]   Culture, blood (Routine X 2) w Reflex to ID Panel     Status: None (Preliminary result)   Collection Time: 12/06/20  4:03 PM   Specimen: BLOOD RIGHT HAND  Result Value Ref Range Status   Specimen Description BLOOD RIGHT HAND  Final   Special Requests   Final    BOTTLES DRAWN AEROBIC ONLY Blood Culture adequate volume   Culture   Final    NO GROWTH 3 DAYS Performed at [facility], [address] [place], [place] [address]    Report Status PENDING  Incomplete  Culture, blood (Routine X 2) w Reflex to ID Panel     Status: None (Preliminary result)   Collection Time: 12/06/20  4:12 PM   Specimen: BLOOD LEFT HAND  Result Value Ref Range Status   Specimen Description BLOOD LEFT HAND  Final   Special Requests   Final    BOTTLES DRAWN AEROBIC ONLY Blood Culture adequate volume   Culture   Final    NO GROWTH 3 DAYS Performed at [facility], [address], [place], [place] [address]    Report Status PENDING  Incomplete  Radiology  Studies: MR BRAIN WO CONTRAST  Result Date: 12/08/2020 CLINICAL DATA:  Mental status change, unknown cause. EXAM: MRI HEAD WITHOUT CONTRAST TECHNIQUE: Multiplanar, multiecho pulse sequences of the brain and surrounding structures were obtained without intravenous contrast. COMPARISON:  Head CT 12/05/2020 FINDINGS: The study is mildly motion degraded. Brain: There is a 1 cm acute infarct involving the mid to posterior aspect of the left cingulate gyrus. Patchy T2 hyperintensities in the cerebral white matter bilaterally are nonspecific but compatible with moderate chronic small vessel ischemic disease. There is a chronic lacunar infarct involving the body of the right caudate nucleus. A chronic microhemorrhage is noted in the region of the left external capsule. There is mild-to-moderate cerebral atrophy. No mass, midline shift, or extra-axial fluid collection is identified. Vascular: Major intracranial vascular flow voids are preserved. Skull and upper cervical spine: Unremarkable bone marrow signal. Sinuses/Orbits: Unremarkable orbits. Minimal bilateral ethmoid air cell mucosal thickening. Trace left mastoid fluid. Other: None. IMPRESSION: 1. Small acute infarct in the left cingulate gyrus. 2. Moderate chronic small vessel ischemic disease. Electronically Signed   By: Sebastian AcheAllen  Grady M.D.   On: 12/08/2020 18:40   ECHOCARDIOGRAM LIMITED  Result Date: 12/08/2020    ECHOCARDIOGRAM LIMITED REPORT   Patient Name:   Jane Navarro Date of Exam: 12/08/2020 Medical Rec #:  [id number]    Height:       60.0 in Accession #:    [id number]   Weight:       141.5 lb Date of Birth:  04/05/1945    BSA:          1.612 m Patient Age:    74 years     BP:           121/86 mmHg Patient Gender: F            HR:           66 bpm. Exam Location:  Inpatient Procedure: Limited Echo, Limited Color Doppler and Cardiac Doppler Indications:    Reassess LV mass  History:        Patient has prior history of Echocardiogram examinations, most                  recent 12/06/2020. Signs/Symptoms:Altered Mental Status; Risk                 Factors:Current Smoker.  Sonographer:    Delcie RochLauren Pennington RDCS Referring Phys: [id number] CALLIE E GOODRICH IMPRESSIONS  1. 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 It may be slightly smaller in size but is essentially unchanged from previous study. Kristeen MissPhilip Nahser MD Electronically signed by Kristeen MissPhilip Nahser MD Signature Date/Time: 12/08/2020/4:01:37 PM    Final         Scheduled Meds:  atorvastatin  80 mg Oral Daily   cyanocobalamin  1,000 mcg Intramuscular Daily   levothyroxine  50 mcg Intravenous Daily   potassium chloride  40 mEq Oral Once   sodium chloride flush  3 mL Intravenous Q12H   [START ON 12/13/2020] thiamine injection  100 mg Intravenous Daily   Continuous Infusions:  acyclovir 640 mg (12/09/20 0327)   heparin 850 Units/hr (12/09/20 0100)   thiamine injection Stopped (12/08/20 1104)     LOS: 4 days    Time spent: 35 minutes.     Alba CoryBelkys A Rajanae Mantia, MD [facility]   If 7PM-7AM, please contact night-coverage [email or web address]  12/09/2020, 7:58 AM

## 2020-12-09 NOTE — Progress Notes (Signed)
Recollect labs have resulted.  New results do not reflect critical lab values.  These results are much more consistent with her previous lab values.  MD B. Chotiner informed and this RN is awaiting MD review of all values.  Potassium repletion orders expected.  Will continue to monitor and replete as ordered.

## 2020-12-09 NOTE — Progress Notes (Signed)
ANTICOAGULATION CONSULT NOTE - Follow Up Consult  Pharmacy Consult for heparin Indication:  LV thrombus  (Initially suspected stroke)  Labs: Recent Labs    12/08/20 0108 12/08/20 1133 12/08/20 2146 12/09/20 0345 12/09/20 0501 12/09/20 0743 12/09/20 1539  HGB 10.6*  --   --  8.0* 9.9*  --   --   HCT 31.1*  --   --  24.0* 28.7*  --   --   PLT 165  --   --  141* 147*  --   --   HEPARINUNFRC 0.27*   < > 0.21*  --   --  0.36 0.36  CREATININE 1.13*  --   --  0.91 1.05*  --   --    < > = values in this interval not displayed.     Assessment: 75yo female now therapeutic on heparin; no infusion issues or signs of bleeding noted. Plan for repeat ECHO in a few days. Unable to complete brain MRI, Initial head CT negative for acute stroke but did show chronic appearing lacunar infarct.  Repeat heparin level therapeutic at 0.36 on 850 units/hr. No bleeding noted.   Goal of Therapy:  Heparin level 0.3-0.5 units/ml   Plan:  Continue heparin infusion at 850 units/hr Daily heparin level and CBC Continue to monitor H&H and platelets    Thank you for involving pharmacy in this patient's care.  Loura Back, PharmD, BCPS Clinical Pharmacist Clinical phone for 12/09/2020 until 10p is [phone] 12/09/2020 4:40 PM  **Pharmacist phone directory can be found on [email or web address] listed under [facility]**

## 2020-12-09 NOTE — Progress Notes (Signed)
Progress Note  Patient Name: Jane Navarro Date of Encounter: 12/09/2020  [facility] Cardiologist: Parke Poisson, MD  Subjective   No acute overnight events. Echo was unchanged from prior.  Patient notes no CP or palpitations.  MRI head was completed and is not suggestive of embolic stroke, rather small vessel disease.  Inpatient Medications    Scheduled Meds:  atorvastatin  80 mg Oral Daily   cyanocobalamin  1,000 mcg Intramuscular Daily   levothyroxine  50 mcg Intravenous Daily   potassium chloride  40 mEq Oral Once   sodium chloride flush  3 mL Intravenous Q12H   [START ON 12/13/2020] thiamine injection  100 mg Intravenous Daily   Continuous Infusions:  sodium chloride 125 mL/hr at 12/09/20 0100   acyclovir 640 mg (12/09/20 0327)   heparin 850 Units/hr (12/09/20 0100)   thiamine injection Stopped (12/08/20 1104)   PRN Meds: haloperidol lactate, hydrALAZINE, LORazepam, ondansetron **OR** ondansetron (ZOFRAN) IV   Vital Signs    Vitals:   12/08/20 2000 12/09/20 0005 12/09/20 0437 12/09/20 0717  BP: (!) 141/80 (!) 155/91 (!) 140/96 (!) 155/94  Pulse: 63 67 70 66  Resp: 19 16  16   Temp: 97.6 F (36.4 C) 98.1 F (36.7 C) 98.4 F (36.9 C) 98.5 F (36.9 C)  TempSrc: Oral Oral Oral Oral  SpO2: 94% 95% 95% 97%  Weight:      Height:        Intake/Output Summary (Last 24 hours) at 12/09/2020 0739 Last data filed at 12/09/2020 0100 Gross per 24 hour  Intake 4309.52 ml  Output 400 ml  Net 3909.52 ml   Last 3 Weights 12/05/2020 12/16/2014  Weight (lbs) 141 lb 8.6 oz 158 lb  Weight (kg) 64.2 kg 71.668 kg      Telemetry    SR presently (transitioned to portable monitor) - Personally Reviewed  ECG    No new ECG tracing today. - Personally Reviewed  Physical Exam   GEN: No acute distress.   Neck: No JVD. Cardiac: RRR. No murmurs, rubs, or gallops.  Respiratory: Clear to auscultation bilaterally. GI: Soft, non-tender, non-distended  MS: No lower extremity  edema. No deformity. Skin: Warm and dry. Neuro:  non focal Psych: pleasant affect.  Labs    High Sensitivity Troponin:  No results for input(s): TROPONINIHS in the last 720 hours.    Chemistry Recent Labs  Lab 12/06/20 0138 12/07/20 0222 12/08/20 0108 12/09/20 0345 12/09/20 0501  NA 127* 128* 128* 111* 132*  K 3.8 3.7 3.1* 2.4* 3.2*  CL 96* 96* 99 87* 104  CO2 22 23 20* 18* 21*  GLUCOSE 111* 83 73 828* 85  BUN 11 13 16 9 12   CREATININE 0.74 1.15* 1.13* 0.91 1.05*  CALCIUM 9.3 9.5 8.9 6.8* 8.6*  PROT 7.5 7.5 6.3*  --   --   ALBUMIN 4.1 3.9 3.3*  --   --   AST 90* 109* 86*  --   --   ALT 26 26 21   --   --   ALKPHOS 72 76 68  --   --   BILITOT 0.5 0.6 0.6  --   --   GFRNONAA >60 50* 51* >60 56*  ANIONGAP 9 9 9 6 7      Hematology Recent Labs  Lab 12/08/20 0108 12/09/20 0345 12/09/20 0501  WBC 6.1 4.4 5.5  RBC 3.31* 2.54* 3.05*  HGB 10.6* 8.0* 9.9*  HCT 31.1* 24.0* 28.7*  MCV 94.0 94.5 94.1  MCH 32.0  31.5 32.5  MCHC 34.1 33.3 34.5  RDW 13.6 14.1 14.0  PLT 165 141* 147*    BNPNo results for input(s): BNP, PROBNP in the last 168 hours.   DDimer No results for input(s): DDIMER in the last 168 hours.   Radiology    MR BRAIN WO CONTRAST  Result Date: 12/08/2020 CLINICAL DATA:  Mental status change, unknown cause. EXAM: MRI HEAD WITHOUT CONTRAST TECHNIQUE: Multiplanar, multiecho pulse sequences of the brain and surrounding structures were obtained without intravenous contrast. COMPARISON:  Head CT 12/05/2020 FINDINGS: The study is mildly motion degraded. Brain: There is a 1 cm acute infarct involving the mid to posterior aspect of the left cingulate gyrus. Patchy T2 hyperintensities in the cerebral white matter bilaterally are nonspecific but compatible with moderate chronic small vessel ischemic disease. There is a chronic lacunar infarct involving the body of the right caudate nucleus. A chronic microhemorrhage is noted in the region of the left external capsule.  There is mild-to-moderate cerebral atrophy. No mass, midline shift, or extra-axial fluid collection is identified. Vascular: Major intracranial vascular flow voids are preserved. Skull and upper cervical spine: Unremarkable bone marrow signal. Sinuses/Orbits: Unremarkable orbits. Minimal bilateral ethmoid air cell mucosal thickening. Trace left mastoid fluid. Other: None. IMPRESSION: 1. Small acute infarct in the left cingulate gyrus. 2. Moderate chronic small vessel ischemic disease. Electronically Signed   By: Sebastian Ache M.D.   On: 12/08/2020 18:40   ECHOCARDIOGRAM LIMITED  Result Date: 12/08/2020    ECHOCARDIOGRAM LIMITED REPORT   Patient Name:   Jane Navarro Date of Exam: 12/08/2020 Medical Rec #:  [id number]    Height:       60.0 in Accession #:    [id number]   Weight:       141.5 lb Date of Birth:  1945-08-03    BSA:          1.612 m Patient Age:    74 years     BP:           121/86 mmHg Patient Gender: F            HR:           66 bpm. Exam Location:  Inpatient Procedure: Limited Echo, Limited Color Doppler and Cardiac Doppler Indications:    Reassess LV mass  History:        Patient has prior history of Echocardiogram examinations, most                 recent 12/06/2020. Signs/Symptoms:Altered Mental Status; Risk                 Factors:Current Smoker.  Sonographer:    Delcie Roch RDCS Referring Phys: [id number] CALLIE E GOODRICH IMPRESSIONS  1. 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 It may be slightly smaller in size but is essentially unchanged from previous study. Kristeen Miss MD Electronically signed by Kristeen Miss MD Signature Date/Time: 12/08/2020/4:01:37 PM    Final     Cardiac Studies   Echocardiogram 12/06/2020: Impressions: 1. 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 It measures approximately 13 mm in maximum  length and 5 mm in width, with a  relatively  narrow attachment base. Left ventricular ejection fraction, by  estimation, is 60 to 65%. The left ventricle has normal function. The left  ventricle has no regional wall motion abnormalities. There is mild  concentric left ventricular  hypertrophy. Left ventricular diastolic parameters are consistent with  Grade I diastolic dysfunction (impaired relaxation). Elevated left atrial  pressure.   2. Right ventricular systolic function is normal. The right ventricular  size is normal.   3. The mitral valve is normal in structure. No evidence of mitral valve  regurgitation. No evidence of mitral stenosis.   4. The aortic valve is normal in structure. Aortic valve regurgitation is  not visualized. No aortic stenosis is present.   5. The inferior vena cava is normal in size with greater than 50%  respiratory variability, suggesting right atrial pressure of 3 mmHg.   Conclusion(s)/Recommendation(s): Discussed with primary team. The left  ventricular mass is highly atypical for either thrombus or vegetation and  the location is unusual for myxoma or papillary fibroelastoma. Its  mobility suggests significant embolic  potential. While TEE would offer better resolution, it may not add  diagnostic value. Consider cardiac MRI.   Patient Profile     75 y.o. female with a history of anemia, borderline hypothyroidism, and remote tobacco abuse (quit in early 2000s) but no known cardiac history who was admitted on 12/05/2020 for altered mental status and concern for acute stroke. Head CT negative for acute stroke but did show chronic appearing lacunar infarct. 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 - Patient  started on IV Heparin. Continue for now unto post CMR - We are attempting CMR today (see density on scout images); we had discussed TEE on Friday unless we can figure out the etiology of this density on CMR  Altered Mental Status Hx of Stroke - Felt to be due to severe hypothyroidism vs stroke - Management per primary team and Neurology.  Severe Hypothyroidism - TSH 41. - Management per primary team.  Hyponatremia -Sodium stable at 128 today.  - Management per primary team.  Hypokalemia - Being supplemented by primary team. - Continue to monitor.  Hyperlipidemia - Continue high-intensity statin.  For questions or updates, please contact [facility] Please consult [email or web address] for contact info under        Signed, Christell Constant, MD  12/09/2020, 7:39 AM

## 2020-12-09 NOTE — Care Management Important Message (Signed)
Important Message  Patient Details  Name: Jane Navarro MRN: [id number] Date of Birth: 1945-12-16   Medicare Important Message Given:  Yes     Jerrin Recore Stefan Church 12/09/2020, 4:06 PM

## 2020-12-10 DIAGNOSIS — G9341 Metabolic encephalopathy: Secondary | ICD-10-CM | POA: Diagnosis not present

## 2020-12-10 DIAGNOSIS — I6330 Cerebral infarction due to thrombosis of unspecified cerebral artery: Secondary | ICD-10-CM | POA: Diagnosis not present

## 2020-12-10 DIAGNOSIS — E079 Disorder of thyroid, unspecified: Secondary | ICD-10-CM | POA: Diagnosis not present

## 2020-12-10 DIAGNOSIS — I160 Hypertensive urgency: Secondary | ICD-10-CM | POA: Diagnosis not present

## 2020-12-10 LAB — BASIC METABOLIC PANEL
Anion gap: 7 (ref 5–15)
BUN: 9 mg/dL (ref 8–23)
CO2: 20 mmol/L — ABNORMAL LOW (ref 22–32)
Calcium: 9 mg/dL (ref 8.9–10.3)
Chloride: 108 mmol/L (ref 98–111)
Creatinine, Ser: 1.02 mg/dL — ABNORMAL HIGH (ref 0.44–1.00)
GFR, Estimated: 58 mL/min — ABNORMAL LOW (ref >60)
Glucose, Bld: 79 mg/dL (ref 70–99)
Potassium: 3.7 mmol/L (ref 3.5–5.1)
Sodium: 135 mmol/L (ref 135–145)

## 2020-12-10 LAB — CBC
HCT: 30 % — ABNORMAL LOW (ref 36.0–46.0)
Hemoglobin: 9.8 g/dL — ABNORMAL LOW (ref 12.0–15.0)
MCH: 31.8 pg (ref 26.0–34.0)
MCHC: 32.7 g/dL (ref 30.0–36.0)
MCV: 97.4 fL (ref 80.0–100.0)
Platelets: 171 10*3/uL (ref 150–400)
RBC: 3.08 MIL/uL — ABNORMAL LOW (ref 3.87–5.11)
RDW: 14.5 % (ref 11.5–15.5)
WBC: 5.7 10*3/uL (ref 4.0–10.5)
nRBC: 0 % (ref 0.0–0.2)

## 2020-12-10 LAB — HEPARIN LEVEL (UNFRACTIONATED): Heparin Unfractionated: 0.43 IU/mL (ref 0.30–0.70)

## 2020-12-10 LAB — OLIGOCLONAL BANDS, CSF + SERM

## 2020-12-10 MED ORDER — VITAMIN B-12 100 MCG PO TABS
100.0000 ug | ORAL_TABLET | Freq: Every day | ORAL | Status: DC
  Administered 2020-12-11 – 2020-12-12 (×2): 100 ug via ORAL
  Filled 2020-12-10 (×2): qty 1

## 2020-12-10 MED ORDER — FLEET ENEMA 7-19 GM/118ML RE ENEM
1.0000 | ENEMA | Freq: Once | RECTAL | Status: DC
  Filled 2020-12-10: qty 1

## 2020-12-10 MED ORDER — POLYETHYLENE GLYCOL 3350 17 G PO PACK
17.0000 g | PACK | Freq: Every day | ORAL | Status: DC
  Administered 2020-12-10 – 2020-12-11 (×2): 17 g via ORAL
  Filled 2020-12-10 (×3): qty 1

## 2020-12-10 MED ORDER — BISACODYL 10 MG RE SUPP
10.0000 mg | Freq: Once | RECTAL | Status: AC
  Administered 2020-12-10: 10 mg via RECTAL
  Filled 2020-12-10: qty 1

## 2020-12-10 MED ORDER — SODIUM CHLORIDE 0.9 % IV SOLN: INTRAVENOUS | Status: DC

## 2020-12-10 NOTE — H&P (View-Only) (Signed)
Progress Note  Patient Name: Ozella Comins Date of Encounter: 12/10/2020  [facility] Cardiologist: Parke Poisson, MD  Subjective   No acute overnight events.   MRI with no large thrombus and isointense to myocardium. Patient isn't sure whether or not she wants TEE.  Inpatient Medications    Scheduled Meds:  atorvastatin  80 mg Oral Daily   levothyroxine  50 mcg Intravenous Daily   polyethylene glycol  17 g Oral Daily   sodium chloride flush  3 mL Intravenous Q12H   [START ON 12/13/2020] thiamine injection  100 mg Intravenous Daily   Continuous Infusions:  heparin 850 Units/hr (12/09/20 1639)   thiamine injection 250 mg (12/10/20 0938)   PRN Meds: haloperidol lactate, hydrALAZINE, lip balm, LORazepam, ondansetron **OR** ondansetron (ZOFRAN) IV   Vital Signs    Vitals:   12/10/20 0047 12/10/20 0401 12/10/20 0906 12/10/20 1102  BP: 133/81 (!) 143/94 122/84 (!) 144/90  Pulse: 68 66 76 71  Resp: Temp: 98.5 F (36.9 C) 98.3 F (36.8 C) 98.9 F (37.2 C) 99.5 F (37.5 C)  TempSrc: Oral Oral Oral Oral  SpO2: 98% 96% 95% 96%  Weight:      Height:        Intake/Output Summary (Last 24 hours) at 12/10/2020 1227 Last data filed at 12/10/2020 0900 Gross per 24 hour  Intake 1013.53 ml  Output 200 ml  Net 813.53 ml   Last 3 Weights 12/05/2020 12/16/2014  Weight (lbs) 141 lb 8.6 oz 158 lb  Weight (kg) 64.2 kg 71.668 kg      Telemetry    SR - Personally Reviewed  ECG    No new ECG tracing today. - Personally Reviewed  Physical Exam   GEN: No acute distress.   Neck: No JVD. Cardiac: RRR. No murmurs, rubs, or gallops or knocks Respiratory: Clear to auscultation bilaterally. GI: Soft, non-tender, non-distended  MS: No lower extremity edema. No deformity. Skin: Warm and dry. Neuro:  non focal Psych: pleasant affect.  Labs    High Sensitivity Troponin:  No results for input(s): TROPONINIHS in the last 720 hours.    Chemistry Recent Labs  Lab  12/06/20 0138 12/07/20 0222 12/08/20 0108 12/09/20 0345 12/09/20 0501 12/10/20 0803  NA 127* 128* 128* 111* 132* 135  K 3.8 3.7 3.1* 2.4* 3.2* 3.7  CL 96* 96* 99 87* 104 108  CO2 22 23 20* 18* 21* 20*  GLUCOSE 111* 83 73 828* 85 79  BUN CREATININE 0.74 1.15* 1.13* 0.91 1.05* 1.02*  CALCIUM 9.3 9.5 8.9 6.8* 8.6* 9.0  PROT 7.5 7.5 6.3*  --   --   --   ALBUMIN 4.1 3.9 3.3*  --   --   --   AST 90* 109* 86*  --   --   --   ALT --   --   --   ALKPHOS 72 76 68  --   --   --   BILITOT 0.5 0.6 0.6  --   --   --   GFRNONAA >60 50* 51* >60 56* 58*  ANIONGAP Hematology Recent Labs  Lab 12/09/20 0345 12/09/20 0501 12/10/20 0803  WBC 4.4 5.5 5.7  RBC 2.54* 3.05* 3.08*  HGB 8.0* 9.9* 9.8*  HCT 24.0* 28.7* 30.0*  MCV 94.5 94.1 97.4  MCH 31.5 32.5 31.8  MCHC 33.3  34.5 32.7  RDW 14.1 14.0 14.5  PLT 141* 147* 171    BNPNo results for input(s): BNP, PROBNP in the last 168 hours.   DDimer No results for input(s): DDIMER in the last 168 hours.   Radiology    CT ANGIO HEAD NECK W WO CM  Result Date: 12/09/2020 CLINICAL DATA:  Stroke, follow-up EXAM: CT ANGIOGRAPHY HEAD AND NECK TECHNIQUE: Multidetector CT imaging of the head and neck was performed using the standard protocol during bolus administration of intravenous contrast. Multiplanar CT image reconstructions and MIPs were obtained to evaluate the vascular anatomy. Carotid stenosis measurements (when applicable) are obtained utilizing NASCET criteria, using the distal internal carotid diameter as the denominator. CONTRAST:  100mL OMNIPAQUE IOHEXOL 350 MG/ML SOLN COMPARISON:  None. FINDINGS: CT HEAD Brain: There is no acute intracranial hemorrhage or mass effect. Small infarct on MRI is not visible. Patchy and confluent areas of low-density in the supratentorial white matter likely reflect chronic microvascular ischemic changes. 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 Vascular: No  hyperdense vessel. Skull: No new finding. Sinuses/Orbits: No acute finding. Other: None. Review of the MIP images confirms the above findings CTA NECK Aortic arch: There is mixed plaque along the arch. Great vessel origins are patent. There is noncalcified plaque along the proximal subclavian artery origins. Right carotid system: Patent. Mild atherosclerotic wall thickening along the common carotid. Mixed plaque along the proximal internal carotid causing less than 50% stenosis. Left carotid system: Patent. Mixed plaque along the common carotid with less than 50% stenosis. Mixed plaque along the proximal internal carotid less than 50% stenosis. Vertebral arteries: Patent. Left vertebral artery arises directly from the arch. Plaque causes at least mild stenosis at the left origin. Right vertebral artery is dominant. Skeleton: Degenerative changes of the cervical spine. Other neck: Unremarkable. Upper chest: Trace pleural effusions. Review of the MIP images confirms the above findings CTA HEAD Anterior circulation: Intracranial internal carotid arteries are patent with minimal calcified plaque. Anterior cerebral arteries are patent. Left A1 ACA is dominant. Middle cerebral arteries are patent. There is mild stenosis of the left M1 MCA. Posterior circulation: Intracranial vertebral arteries are patent. Basilar artery is patent. Major cerebellar artery origins are patent. A right posterior communicating artery is present. Posterior cerebral arteries are patent. Mild bilateral P2 PCA stenoses. Venous sinuses: Patent as allowed by contrast bolus timing. Review of the MIP images confirms the above findings IMPRESSION: No acute intracranial abnormality. Small infarct on MRI is not visible. Plaque at the common and proximal internal carotid arteries with less than 50% stenosis. Mild intracranial atherosclerosis. Electronically Signed   By: Praneil  Patel M.D.   On: 12/09/2020 18:24   DG Abd 1 View  Result Date:  12/09/2020 CLINICAL DATA:  Abdominal distension. EXAM: ABDOMEN - 1 VIEW COMPARISON:  CT abdomen and pelvis 12/16/2014. FINDINGS: There is gaseous distension of large and small bowel diffusely to the level of the sigmoid colon. Colonic loops in the mid abdomen are dilated measuring up to 8.6 cm in diameter. Small bowel loops are prominent, but not dilated by size criteria. There are rounded calcifications in the pelvis favored as phleboliths when compared to prior CT. The visualized lung bases are clear. No acute fractures are identified. IMPRESSION: 1. Diffuse gaseous distension of the colon with dilatation of the transverse colon/right colon. This is of uncertain etiology. Findings may related to colonic ileus, partial colonic obstruction, or cecal volvulus in the appropriate clinical setting. Further evaluation with CT should be   considered. Electronically Signed   By: Darliss Cheney M.D.   On: 12/09/2020 16:04   MR BRAIN WO CONTRAST  Result Date: 12/08/2020 CLINICAL DATA:  Mental status change, unknown cause. EXAM: MRI HEAD WITHOUT CONTRAST TECHNIQUE: Multiplanar, multiecho pulse sequences of the brain and surrounding structures were obtained without intravenous contrast. COMPARISON:  Head CT 12/05/2020 FINDINGS: The study is mildly motion degraded. Brain: There is a 1 cm acute infarct involving the mid to posterior aspect of the left cingulate gyrus. Patchy T2 hyperintensities in the cerebral white matter bilaterally are nonspecific but compatible with moderate chronic small vessel ischemic disease. There is a chronic lacunar infarct involving the body of the right caudate nucleus. A chronic microhemorrhage is noted in the region of the left external capsule. There is mild-to-moderate cerebral atrophy. No mass, midline shift, or extra-axial fluid collection is identified. Vascular: Major intracranial vascular flow voids are preserved. Skull and upper cervical spine: Unremarkable bone marrow signal.  Sinuses/Orbits: Unremarkable orbits. Minimal bilateral ethmoid air cell mucosal thickening. Trace left mastoid fluid. Other: None. IMPRESSION: 1. Small acute infarct in the left cingulate gyrus. 2. Moderate chronic small vessel ischemic disease. Electronically Signed   By: Sebastian Ache M.D.   On: 12/08/2020 18:40   CT ABDOMEN PELVIS W CONTRAST  Result Date: 12/09/2020 CLINICAL DATA:  Abdominal distension Recent stroke. EXAM: CT ABDOMEN AND PELVIS WITH CONTRAST TECHNIQUE: Multidetector CT imaging of the abdomen and pelvis was performed using the standard protocol following bolus administration of intravenous contrast. CONTRAST:  OMNIPAQUE IOHEXOL 350 MG/ML SOLN COMPARISON:  CT 12/16/2014 FINDINGS: Lower chest: Trace bilateral pleural effusions. Associated basilar atelectasis. Atherosclerosis and tortuosity of the descending thoracic aorta. Small pericardial effusion adjacent to the right heart border. Hepatobiliary: Scattered small hepatic hypodensities that are not significantly changed from prior exam. Largest measures 16 mm in the left lobe and is consistent with simple cyst. Majority of these lesions are too small to accurately characterize. Unremarkable gallbladder. No pericholecystic inflammation or biliary dilatation. Pancreas: Unremarkable. No pancreatic ductal dilatation or surrounding inflammatory changes. Spleen: Normal in size without focal abnormality. Adrenals/Urinary Tract: No adrenal nodule. Mild symmetric perinephric edema which was also present on prior exam. No hydronephrosis. Tiny cortical hypodensity in the posterior left kidney is too small to characterize but likely cyst. No suspicious renal lesion. No visualized renal stone. Decompressed ureters. Partially distended urinary bladder. No definite bladder wall thickening. Stomach/Bowel: Detailed bowel assessment is limited in the absence of enteric contrast. There is a small hiatal hernia. The stomach is decompressed, equivocal gastric  wall thickening versus nondistention. No small bowel obstruction or inflammation. Slight increased air throughout nondilated small bowel. Normal appendix courses into the right pericolic gutter under the liver tip. There is subtle wall thickening of the cecum and ascending colon, series 9, images 24 and 40. Mild gaseous distension of transverse colon. Anti mesenteric border of transverse colon extends into an umbilical hernia, no associated wall thickening or inflammation. Moderate stool in the left colon. Sigmoid colon is redundant and courses into the central abdomen. Sigmoid colonic diverticulosis. No diverticulitis. Mild rectal wall thickening. Small volume of stool in the rectum. Vascular/Lymphatic: Moderate aortic atherosclerosis. No aortic aneurysm. Patent portal vein. No enlarged lymph nodes in the abdomen or pelvis. Reproductive: Status post hysterectomy. No adnexal masses. Other: Umbilical hernia contains fat and anti mesenteric border of the transverse colon. Trace fluid in the aneurysm sac. No ascites or free air. Musculoskeletal: Degenerative change in the lumbar spine is most prominent at L5-S1.  There are no acute or suspicious osseous abnormalities. IMPRESSION: 1. Subtle wall thickening of the cecum and ascending colon, suspicious for colitis. 2. Colonic redundancy with mild distention with air and stool, query constipation and colonic dysmotility. 3. Umbilical hernia contains fat and anti mesenteric border of the transverse colon, no associated wall thickening or inflammation. 4. Sigmoid colonic diverticulosis without diverticulitis. 5. Trace bilateral pleural effusions. Small pericardial effusion adjacent to the right heart border. 6. Scattered cysts in the liver as well as low-density lesions too small to characterize. Aortic Atherosclerosis (ICD10-I70.0). Electronically Signed   By: Narda RutherfordMelanie  Sanford M.D.   On: 12/09/2020 19:40   MR CARDIAC MORPHOLOGY W WO CONTRAST  Result Date:  12/09/2020 CLINICAL DATA:  Clinical question of papillary mass EXAM: CARDIAC MRI TECHNIQUE: The patient was scanned on a 1.5 Tesla GE magnet. A dedicated cardiac coil was used. Functional imaging was done using Fiesta sequences. 2,3, and 4 chamber views were done to assess for RWMA's. Modified Simpson's rule using a short axis stack was used to calculate an ejection fraction on a dedicated work Research officer, trade unionstation using Circle software. The patient received 10 cc of Gadavist. After 10 minutes inversion recovery sequences were used to assess for infiltration and scar tissue. CONTRAST:  10 cc  of Gadavist FINDINGS: 1. Normal left ventricular size, with LVEDD 37 mm, and LVEDVi 54 mL/m2. Normal left ventricular thickness, with intraventricular septal thickness of 13 mm, posterior wall thickness of 9 mm, and septal to posterior ratio < 1.5; myocardial mass index 56 g/m2. Normal left ventricular systolic function (LVEF =60%). There are no regional wall motion abnormalities. Left ventricular parametric mapping notable for elevated native T1 signal (basal inferior and inferolateral 1260 ms) and grossly elevated T2 signal (60 ms). There is no late gadolinium enhancement in the left ventricular myocardium meeting 5 standard deviation from normal criteria. Anterolateral papillary muscle appears apically displaced and appears bifid in the short axis. No evidence of significant hypertrophy. The is no systolic anterior motion of the mitral valve. Papillary muscle is isointense on gradient echo not suggestive of calcification. Papillary muscle is isointense to myocardium on LGE sequences, not suggestive of thrombus. Papillary muscle is isointense to myocardium on T2 signal, not suggestive of myxoma. Papillary muscle perfuses with the rest of the myocardium. 2. Normal right ventricular size with RVEDVI 60 mL/m2. Normal right ventricular thickness. Normal right ventricular systolic function (RVEF =67 %). There are no regional wall motion  abnormalities or aneurysms. 3.  Normal left and right atrial size. 4. Normal size of the aortic root, ascending aorta and pulmonary artery. 5.  No significant valvular abnormalities. 6.  Normal pericardium.  No pericardial effusion. 7. Grossly, no extracardiac findings. Recommended dedicated study if concerned for non-cardiac pathology. 8. Significant breathhold artifact through entire study. This decreases the accuracy of volumetric assessment, LGE assessment, and that of parametric mapping. IMPRESSION: Significant artifact. Anterolateral papillary muscle appears apically displaced and appears bifid in the short axis. Rhabdomyoma and very small thrombus below the resolution of CMR cannot be excluded. Riley LamMahesh Esdras Delair MD Electronically Signed   By: Riley LamMahesh   Arlie Posch M.D.   On: 12/09/2020 20:15   ECHOCARDIOGRAM LIMITED  Result Date: 12/08/2020    ECHOCARDIOGRAM LIMITED REPORT   Patient Name:   Joanna PuffJUDY Divito Date of Exam: 12/08/2020 Medical Rec #:  [id number]    Height:       60.0 in Accession #:    [id number]   Weight:       141.5 lb Date of Birth:  1945/12/07    BSA:          1.612 m Patient Age:    74 years     BP:           121/86 mmHg Patient Gender: F            HR:           66 bpm. Exam Location:  Inpatient Procedure: Limited Echo, Limited Color Doppler and Cardiac Doppler Indications:    Reassess LV mass  History:        Patient has prior history of Echocardiogram examinations, most                 recent 12/06/2020. Signs/Symptoms:Altered Mental Status; Risk                 Factors:Current Smoker.  Sonographer:    Delcie Roch RDCS Referring Phys: [id number] CALLIE E GOODRICH IMPRESSIONS  1. 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 It may be slightly smaller in size but is essentially unchanged from previous study. Kristeen Miss MD Electronically signed by Kristeen Miss MD Signature Date/Time: 12/08/2020/4:01:37 PM    Final     Cardiac Studies   Echocardiogram 12/06/2020: Impressions: 1. 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 It measures approximately 13 mm in maximum  length and 5 mm in width, with a  relatively narrow attachment base. Left ventricular ejection fraction, by  estimation, is 60 to 65%. The left ventricle has normal function. The left  ventricle has no regional wall motion abnormalities. There is mild  concentric left ventricular  hypertrophy. Left ventricular diastolic parameters are consistent with  Grade I diastolic dysfunction (impaired relaxation). Elevated left atrial  pressure.   2. Right ventricular systolic function is normal. The right ventricular  size is normal.   3. The mitral valve is normal in structure. No evidence of mitral valve  regurgitation. No evidence of mitral stenosis.   4. The aortic valve is normal in structure. Aortic valve regurgitation is  not visualized. No aortic stenosis is present.   5. The inferior vena cava is normal in size with greater than 50%  respiratory variability, suggesting right atrial pressure of 3 mmHg.   Conclusion(s)/Recommendation(s): Discussed with primary team. The left  ventricular mass is highly atypical for either thrombus or vegetation and  the location is unusual for myxoma or papillary fibroelastoma. Its  mobility suggests significant embolic  potential. While TEE would offer better resolution, it may not add  diagnostic value. Consider cardiac MRI.   Patient Profile     75 y.o. female with a history of anemia, borderline hypothyroidism, and remote tobacco abuse (quit in early 2000s) but no known cardiac history who was admitted on 12/05/2020 for altered mental status and concern for acute stroke. Head CT negative for acute stroke but did show chronic appearing lacunar infarct. 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  We discussed the risks and discussed her stroke risk.  Showed images to her and her family.  She will talk with family; presently we will prepare for TEE tomorrow  Altered Mental Status Hx of Stroke - Felt to be due to severe hypothyroidism; has largelt resolved - Management per primary team and Neurology.  Severe Hypothyroidism - TSH 41. - Management per primary team.  Hyponatremia - Management per primary team.  Hypokalemia - Being supplemented by primary team. - Continue to monitor.  Hyperlipidemia - Continue high-intensity statin.  For questions or updates, please contact [facility] Please consult [email or web address] for contact info under        Signed, Christell Constant, MD  12/10/2020, 12:27 PM

## 2020-12-10 NOTE — Progress Notes (Signed)
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 On 9/4 pt with agitation and given Ativan x 3. PMH - kidney stone and rest unknown because she has not been to a provider in years.   OT comments  Pt seen with son in room, OT completed education to pt and son on role of OT and focus of sessions acutely; as well as what can be expected at time of dc with plans for transition to SNF for post acute care. Family in support of recommendation. Pt continues with decreased insight and attention throughout session requiring frequent redirection to task and safety modifications. Slowed pacing for transfers and mobility attributed to reports of vertigo. No LOB observed but multiple times needing redirection to stop grabbing for furniture during mobility with RW in hand. Demo's need for cues and min A for functional mobility, self care transfers, LB ADLs and toileting tasks. Pt and family motivated for further participation with skilled OT at SNF with d/c.   Follow Up Recommendations  SNF;Supervision/Assistance - 24 hour    Equipment Recommendations  3 in 1 bedside commode    Recommendations for Other Services      Precautions / Restrictions Precautions Precautions: Fall Precaution Comments: Short R step length, lending to imbalance and RW too far forward Required Braces or Orthoses: Other Brace Other Brace: endorses baseline vertigo, pt wears neoprene type sleeve on lt knee  due to pain       Mobility Bed Mobility                    Transfers Overall transfer level: Needs assistance Equipment used: Rolling walker (2 wheeled) Transfers: Sit to/from [employer] Sit to Stand: Min assist Stand pivot transfers: Min assist       General transfer comment: min guard - min A fro sit<>stands this session; pt with poor safety awareness and attempted to sit on both toilet and chair before close enough demo-ing decreased carryover from previous session    Balance                                           ADL either performed or assessed with clinical judgement   ADL                   Upper Body Dressing : Minimal assistance;Sitting Upper Body Dressing Details (indicate cue type and reason): dof socks donn shoes Lower Body Dressing: Minimal assistance;Sit to/from stand Lower Body Dressing Details (indicate cue type and reason): decreased attention to task, and mild balance deficits Toilet Transfer: Minimal assistance;RW;Ambulation;Comfort height toilet;Grab bars Toilet Transfer Details (indicate cue type and reason): min A for safety verbal cues and powering up from the toilet as well as need for cues for releasing grasp from grab bar on wall with transfers         Functional mobility during ADLs: Minimal assistance;Rolling walker;Cueing for safety General  ADL Comments: pt tolerated recliner<>commode with RW, decreased sequencing and safety wtih tendnecy to grab for furniture in room while also using RW for mobility. decreased insight to safety     Vision       Perception     Praxis      Cognition Arousal/Alertness: Awake/alert Behavior During Therapy: WFL for tasks assessed/performed Overall Cognitive Status: Impaired/Different from baseline Area of Impairment: Attention                   Current Attention Level: Sustained Memory: Decreased short-term memory Following Commands: Follows  one step commands with increased time Safety/Judgement: Decreased awareness of safety Awareness: Intellectual Problem Solving: Slow processing;Requires verbal cues;Requires tactile cues General Comments: decreased processing, easily distracted requiring intermittent redirection to task. son's report visual impairments are d/t not having appropriate prescription glasses        Exercises     Shoulder Instructions       General Comments son present for session for education    Pertinent Vitals/ Pain       Pain Assessment: No/denies pain  Home Living                                          Prior Functioning/Environment              Frequency  Min 2X/week        Progress Toward Goals  OT Goals(current goals can now be found in the care plan section)  Progress towards OT goals: Progressing toward goals  Acute Rehab OT Goals Time For Goal Achievement: 12/20/20 Potential to Achieve Goals: Good  Plan Discharge plan remains appropriate    Co-evaluation                 AM-PAC OT "6 Clicks" Daily Activity     Outcome Measure   Help from another person eating meals?: A Little Help from another person taking care of personal grooming?: A Little Help from another person toileting, which includes using toliet, bedpan, or urinal?: A Little Help from another person bathing (including washing, rinsing, drying)?: A Little Help from another person to put on and taking off regular upper body clothing?: A Little Help from another person to put on and taking off regular lower body clothing?: A Little 6 Click Score: 18    End of Session Equipment Utilized During Treatment: Gait belt;Rolling walker  OT Visit Diagnosis: Unsteadiness on feet (R26.81);Other abnormalities of gait and mobility (R26.89);Muscle weakness (generalized) (M62.81);Other symptoms and signs involving cognitive function;Pain Pain - Right/Left: Left Pain - part of body: Knee    Activity Tolerance Patient tolerated treatment well   Patient Left in bed;with nursing/sitter in room (CNA present to complete bathing tasks)   Nurse Communication Mobility status     Time: 1455-1515 OT Time Calculation (min): 20 min  Charges: OT General Charges $OT Visit: 1 Visit OT Treatments $Self Care/Home Management : 8-22 mins  Nickayla Mcinnis OTR/L acute rehab services Office: [phone]  12/10/2020, 3:22 PM

## 2020-12-10 NOTE — TOC CAGE-AID Note (Signed)
Transition of Care [facility]) - CAGE-AID Screening   Patient Details  Name: Jane Navarro MRN: [id number] Date of Birth: 1945-05-16  [facility] CM/SW Contact:    Erin Sons, LCSW Phone Number: 12/10/2020, 11:17 AM   Clinical Narrative:  Pt not fully oriented at this time; unable to participate in CAGE-AID.  CAGE-AID Screening: Substance Abuse Screening unable to be completed due to: : Patient unable to participate             Substance Abuse Education Offered: No

## 2020-12-10 NOTE — Progress Notes (Signed)
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     Assessment: 75yo female now therapeutic on heparin; no infusion issues or signs of bleeding noted. Plan for repeat ECHO in a few days. Unable to complete brain MRI, Initial head CT negative for acute stroke but did show chronic appearing lacunar infarct.  Heparin level therapeutic at 0.43 on 850 units/hr. No bleeding noted.   Goal of Therapy:  Heparin level 0.3-0.5 units/ml   Plan:  Continue heparin infusion at 850 units/hr Daily heparin level and CBC Continue to monitor H&H and platelets     Thank you for allowing [place] to participate in this patients care. Signe Colt, PharmD 12/10/2020 7:24 AM  **Pharmacist phone directory can be found on [email or web address] listed under [facility]**

## 2020-12-10 NOTE — Progress Notes (Signed)
Progress Note  Patient Name: Jane Navarro Date of Encounter: 12/10/2020  [facility] Cardiologist: Parke Poisson, MD  Subjective   No acute overnight events.   MRI with no large thrombus and isointense to myocardium. Patient isn't sure whether or not she wants TEE.  Inpatient Medications    Scheduled Meds:  atorvastatin  80 mg Oral Daily   levothyroxine  50 mcg Intravenous Daily   polyethylene glycol  17 g Oral Daily   sodium chloride flush  3 mL Intravenous Q12H   [START ON 12/13/2020] thiamine injection  100 mg Intravenous Daily   Continuous Infusions:  heparin 850 Units/hr (12/09/20 1639)   thiamine injection 250 mg (12/10/20 0938)   PRN Meds: haloperidol lactate, hydrALAZINE, lip balm, LORazepam, ondansetron **OR** ondansetron (ZOFRAN) IV   Vital Signs    Vitals:   12/10/20 0047 12/10/20 0401 12/10/20 0906 12/10/20 1102  BP: 133/81 (!) 143/94 122/84 (!) 144/90  Pulse: 68 66 76 71  Resp: Temp: 98.5 F (36.9 C) 98.3 F (36.8 C) 98.9 F (37.2 C) 99.5 F (37.5 C)  TempSrc: Oral Oral Oral Oral  SpO2: 98% 96% 95% 96%  Weight:      Height:        Intake/Output Summary (Last 24 hours) at 12/10/2020 1227 Last data filed at 12/10/2020 0900 Gross per 24 hour  Intake 1013.53 ml  Output 200 ml  Net 813.53 ml   Last 3 Weights 12/05/2020 12/16/2014  Weight (lbs) 141 lb 8.6 oz 158 lb  Weight (kg) 64.2 kg 71.668 kg      Telemetry    SR - Personally Reviewed  ECG    No new ECG tracing today. - Personally Reviewed  Physical Exam   GEN: No acute distress.   Neck: No JVD. Cardiac: RRR. No murmurs, rubs, or gallops or knocks Respiratory: Clear to auscultation bilaterally. GI: Soft, non-tender, non-distended  MS: No lower extremity edema. No deformity. Skin: Warm and dry. Neuro:  non focal Psych: pleasant affect.  Labs    High Sensitivity Troponin:  No results for input(s): TROPONINIHS in the last 720 hours.    Chemistry Recent Labs  Lab  12/06/20 0138 12/07/20 0222 12/08/20 0108 12/09/20 0345 12/09/20 0501 12/10/20 0803  NA 127* 128* 128* 111* 132* 135  K 3.8 3.7 3.1* 2.4* 3.2* 3.7  CL 96* 96* 99 87* 104 108  CO2 22 23 20* 18* 21* 20*  GLUCOSE 111* 83 73 828* 85 79  BUN CREATININE 0.74 1.15* 1.13* 0.91 1.05* 1.02*  CALCIUM 9.3 9.5 8.9 6.8* 8.6* 9.0  PROT 7.5 7.5 6.3*  --   --   --   ALBUMIN 4.1 3.9 3.3*  --   --   --   AST 90* 109* 86*  --   --   --   ALT --   --   --   ALKPHOS 72 76 68  --   --   --   BILITOT 0.5 0.6 0.6  --   --   --   GFRNONAA >60 50* 51* >60 56* 58*  ANIONGAP Hematology Recent Labs  Lab 12/09/20 0345 12/09/20 0501 12/10/20 0803  WBC 4.4 5.5 5.7  RBC 2.54* 3.05* 3.08*  HGB 8.0* 9.9* 9.8*  HCT 24.0* 28.7* 30.0*  MCV 94.5 94.1 97.4  MCH 31.5 32.5 31.8  MCHC 33.3  34.5 32.7  RDW 14.1 14.0 14.5  PLT 141* 147* 171    BNPNo results for input(s): BNP, PROBNP in the last 168 hours.   DDimer No results for input(s): DDIMER in the last 168 hours.   Radiology    CT ANGIO HEAD NECK W WO CM  Result Date: 12/09/2020 CLINICAL DATA:  Stroke, follow-up EXAM: CT ANGIOGRAPHY HEAD AND NECK TECHNIQUE: Multidetector CT imaging of the head and neck was performed using the standard protocol during bolus administration of intravenous contrast. Multiplanar CT image reconstructions and MIPs were obtained to evaluate the vascular anatomy. Carotid stenosis measurements (when applicable) are obtained utilizing NASCET criteria, using the distal internal carotid diameter as the denominator. CONTRAST:  OMNIPAQUE IOHEXOL 350 MG/ML SOLN COMPARISON:  None. FINDINGS: CT HEAD Brain: There is no acute intracranial hemorrhage or mass effect. Small infarct on MRI is not visible. Patchy and confluent areas of low-density in the supratentorial white matter likely reflect chronic microvascular ischemic changes. 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 Vascular: No  hyperdense vessel. Skull: No new finding. Sinuses/Orbits: No acute finding. Other: None. Review of the MIP images confirms the above findings CTA NECK Aortic arch: There is mixed plaque along the arch. Great vessel origins are patent. There is noncalcified plaque along the proximal subclavian artery origins. Right carotid system: Patent. Mild atherosclerotic wall thickening along the common carotid. Mixed plaque along the proximal internal carotid causing less than 50% stenosis. Left carotid system: Patent. Mixed plaque along the common carotid with less than 50% stenosis. Mixed plaque along the proximal internal carotid less than 50% stenosis. Vertebral arteries: Patent. Left vertebral artery arises directly from the arch. Plaque causes at least mild stenosis at the left origin. Right vertebral artery is dominant. Skeleton: Degenerative changes of the cervical spine. Other neck: Unremarkable. Upper chest: Trace pleural effusions. Review of the MIP images confirms the above findings CTA HEAD Anterior circulation: Intracranial internal carotid arteries are patent with minimal calcified plaque. Anterior cerebral arteries are patent. Left A1 ACA is dominant. Middle cerebral arteries are patent. There is mild stenosis of the left M1 MCA. Posterior circulation: Intracranial vertebral arteries are patent. Basilar artery is patent. Major cerebellar artery origins are patent. A right posterior communicating artery is present. Posterior cerebral arteries are patent. Mild bilateral P2 PCA stenoses. Venous sinuses: Patent as allowed by contrast bolus timing. Review of the MIP images confirms the above findings IMPRESSION: No acute intracranial abnormality. Small infarct on MRI is not visible. Plaque at the common and proximal internal carotid arteries with less than 50% stenosis. Mild intracranial atherosclerosis. Electronically Signed   By: Guadlupe Spanish M.D.   On: 12/09/2020 18:24   DG Abd 1 View  Result Date:  12/09/2020 CLINICAL DATA:  Abdominal distension. EXAM: ABDOMEN - 1 VIEW COMPARISON:  CT abdomen and pelvis 12/16/2014. FINDINGS: There is gaseous distension of large and small bowel diffusely to the level of the sigmoid colon. Colonic loops in the mid abdomen are dilated measuring up to 8.6 cm in diameter. Small bowel loops are prominent, but not dilated by size criteria. There are rounded calcifications in the pelvis favored as phleboliths when compared to prior CT. The visualized lung bases are clear. No acute fractures are identified. IMPRESSION: 1. Diffuse gaseous distension of the colon with dilatation of the transverse colon/right colon. This is of uncertain etiology. Findings may related to colonic ileus, partial colonic obstruction, or cecal volvulus in the appropriate clinical setting. Further evaluation with CT should be  considered. Electronically Signed   By: Darliss Cheney M.D.   On: 12/09/2020 16:04   MR BRAIN WO CONTRAST  Result Date: 12/08/2020 CLINICAL DATA:  Mental status change, unknown cause. EXAM: MRI HEAD WITHOUT CONTRAST TECHNIQUE: Multiplanar, multiecho pulse sequences of the brain and surrounding structures were obtained without intravenous contrast. COMPARISON:  Head CT 12/05/2020 FINDINGS: The study is mildly motion degraded. Brain: There is a 1 cm acute infarct involving the mid to posterior aspect of the left cingulate gyrus. Patchy T2 hyperintensities in the cerebral white matter bilaterally are nonspecific but compatible with moderate chronic small vessel ischemic disease. There is a chronic lacunar infarct involving the body of the right caudate nucleus. A chronic microhemorrhage is noted in the region of the left external capsule. There is mild-to-moderate cerebral atrophy. No mass, midline shift, or extra-axial fluid collection is identified. Vascular: Major intracranial vascular flow voids are preserved. Skull and upper cervical spine: Unremarkable bone marrow signal.  Sinuses/Orbits: Unremarkable orbits. Minimal bilateral ethmoid air cell mucosal thickening. Trace left mastoid fluid. Other: None. IMPRESSION: 1. Small acute infarct in the left cingulate gyrus. 2. Moderate chronic small vessel ischemic disease. Electronically Signed   By: Sebastian Ache M.D.   On: 12/08/2020 18:40   CT ABDOMEN PELVIS W CONTRAST  Result Date: 12/09/2020 CLINICAL DATA:  Abdominal distension Recent stroke. EXAM: CT ABDOMEN AND PELVIS WITH CONTRAST TECHNIQUE: Multidetector CT imaging of the abdomen and pelvis was performed using the standard protocol following bolus administration of intravenous contrast. CONTRAST:  OMNIPAQUE IOHEXOL 350 MG/ML SOLN COMPARISON:  CT 12/16/2014 FINDINGS: Lower chest: Trace bilateral pleural effusions. Associated basilar atelectasis. Atherosclerosis and tortuosity of the descending thoracic aorta. Small pericardial effusion adjacent to the right heart border. Hepatobiliary: Scattered small hepatic hypodensities that are not significantly changed from prior exam. Largest measures 16 mm in the left lobe and is consistent with simple cyst. Majority of these lesions are too small to accurately characterize. Unremarkable gallbladder. No pericholecystic inflammation or biliary dilatation. Pancreas: Unremarkable. No pancreatic ductal dilatation or surrounding inflammatory changes. Spleen: Normal in size without focal abnormality. Adrenals/Urinary Tract: No adrenal nodule. Mild symmetric perinephric edema which was also present on prior exam. No hydronephrosis. Tiny cortical hypodensity in the posterior left kidney is too small to characterize but likely cyst. No suspicious renal lesion. No visualized renal stone. Decompressed ureters. Partially distended urinary bladder. No definite bladder wall thickening. Stomach/Bowel: Detailed bowel assessment is limited in the absence of enteric contrast. There is a small hiatal hernia. The stomach is decompressed, equivocal gastric  wall thickening versus nondistention. No small bowel obstruction or inflammation. Slight increased air throughout nondilated small bowel. Normal appendix courses into the right pericolic gutter under the liver tip. There is subtle wall thickening of the cecum and ascending colon, series 9, images 24 and 40. Mild gaseous distension of transverse colon. Anti mesenteric border of transverse colon extends into an umbilical hernia, no associated wall thickening or inflammation. Moderate stool in the left colon. Sigmoid colon is redundant and courses into the central abdomen. Sigmoid colonic diverticulosis. No diverticulitis. Mild rectal wall thickening. Small volume of stool in the rectum. Vascular/Lymphatic: Moderate aortic atherosclerosis. No aortic aneurysm. Patent portal vein. No enlarged lymph nodes in the abdomen or pelvis. Reproductive: Status post hysterectomy. No adnexal masses. Other: Umbilical hernia contains fat and anti mesenteric border of the transverse colon. Trace fluid in the aneurysm sac. No ascites or free air. Musculoskeletal: Degenerative change in the lumbar spine is most prominent at L5-S1.  There are no acute or suspicious osseous abnormalities. IMPRESSION: 1. Subtle wall thickening of the cecum and ascending colon, suspicious for colitis. 2. Colonic redundancy with mild distention with air and stool, query constipation and colonic dysmotility. 3. Umbilical hernia contains fat and anti mesenteric border of the transverse colon, no associated wall thickening or inflammation. 4. Sigmoid colonic diverticulosis without diverticulitis. 5. Trace bilateral pleural effusions. Small pericardial effusion adjacent to the right heart border. 6. Scattered cysts in the liver as well as low-density lesions too small to characterize. Aortic Atherosclerosis (ICD10-I70.0). Electronically Signed   By: Narda RutherfordMelanie  Sanford M.D.   On: 12/09/2020 19:40   MR CARDIAC MORPHOLOGY W WO CONTRAST  Result Date:  12/09/2020 CLINICAL DATA:  Clinical question of papillary mass EXAM: CARDIAC MRI TECHNIQUE: The patient was scanned on a 1.5 Tesla GE magnet. A dedicated cardiac coil was used. Functional imaging was done using Fiesta sequences. 2,3, and 4 chamber views were done to assess for RWMA's. Modified Simpson's rule using a short axis stack was used to calculate an ejection fraction on a dedicated work Research officer, trade unionstation using Circle software. The patient received 10 cc of Gadavist. After 10 minutes inversion recovery sequences were used to assess for infiltration and scar tissue. CONTRAST:  10 cc  of Gadavist FINDINGS: 1. Normal left ventricular size, with LVEDD 37 mm, and LVEDVi 54 mL/m2. Normal left ventricular thickness, with intraventricular septal thickness of 13 mm, posterior wall thickness of 9 mm, and septal to posterior ratio < 1.5; myocardial mass index 56 g/m2. Normal left ventricular systolic function (LVEF =60%). There are no regional wall motion abnormalities. Left ventricular parametric mapping notable for elevated native T1 signal (basal inferior and inferolateral 1260 ms) and grossly elevated T2 signal (60 ms). There is no late gadolinium enhancement in the left ventricular myocardium meeting 5 standard deviation from normal criteria. Anterolateral papillary muscle appears apically displaced and appears bifid in the short axis. No evidence of significant hypertrophy. The is no systolic anterior motion of the mitral valve. Papillary muscle is isointense on gradient echo not suggestive of calcification. Papillary muscle is isointense to myocardium on LGE sequences, not suggestive of thrombus. Papillary muscle is isointense to myocardium on T2 signal, not suggestive of myxoma. Papillary muscle perfuses with the rest of the myocardium. 2. Normal right ventricular size with RVEDVI 60 mL/m2. Normal right ventricular thickness. Normal right ventricular systolic function (RVEF =67 %). There are no regional wall motion  abnormalities or aneurysms. 3.  Normal left and right atrial size. 4. Normal size of the aortic root, ascending aorta and pulmonary artery. 5.  No significant valvular abnormalities. 6.  Normal pericardium.  No pericardial effusion. 7. Grossly, no extracardiac findings. Recommended dedicated study if concerned for non-cardiac pathology. 8. Significant breathhold artifact through entire study. This decreases the accuracy of volumetric assessment, LGE assessment, and that of parametric mapping. IMPRESSION: Significant artifact. Anterolateral papillary muscle appears apically displaced and appears bifid in the short axis. Rhabdomyoma and very small thrombus below the resolution of CMR cannot be excluded. Riley LamMahesh Claborn Janusz MD Electronically Signed   By: Riley LamMahesh   Tacy Chavis M.D.   On: 12/09/2020 20:15   ECHOCARDIOGRAM LIMITED  Result Date: 12/08/2020    ECHOCARDIOGRAM LIMITED REPORT   Patient Name:   Jane Navarro Date of Exam: 12/08/2020 Medical Rec #:  [id number]    Height:       60.0 in Accession #:    [id number]   Weight:       141.5 lb Date of Birth:  1945/12/07    BSA:          1.612 m Patient Age:    74 years     BP:           121/86 mmHg Patient Gender: F            HR:           66 bpm. Exam Location:  Inpatient Procedure: Limited Echo, Limited Color Doppler and Cardiac Doppler Indications:    Reassess LV mass  History:        Patient has prior history of Echocardiogram examinations, most                 recent 12/06/2020. Signs/Symptoms:Altered Mental Status; Risk                 Factors:Current Smoker.  Sonographer:    Delcie Roch RDCS Referring Phys: [id number] CALLIE E GOODRICH IMPRESSIONS  1. 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 It may be slightly smaller in size but is essentially unchanged from previous study. Kristeen Miss MD Electronically signed by Kristeen Miss MD Signature Date/Time: 12/08/2020/4:01:37 PM    Final     Cardiac Studies   Echocardiogram 12/06/2020: Impressions: 1. 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 It measures approximately 13 mm in maximum  length and 5 mm in width, with a  relatively narrow attachment base. Left ventricular ejection fraction, by  estimation, is 60 to 65%. The left ventricle has normal function. The left  ventricle has no regional wall motion abnormalities. There is mild  concentric left ventricular  hypertrophy. Left ventricular diastolic parameters are consistent with  Grade I diastolic dysfunction (impaired relaxation). Elevated left atrial  pressure.   2. Right ventricular systolic function is normal. The right ventricular  size is normal.   3. The mitral valve is normal in structure. No evidence of mitral valve  regurgitation. No evidence of mitral stenosis.   4. The aortic valve is normal in structure. Aortic valve regurgitation is  not visualized. No aortic stenosis is present.   5. The inferior vena cava is normal in size with greater than 50%  respiratory variability, suggesting right atrial pressure of 3 mmHg.   Conclusion(s)/Recommendation(s): Discussed with primary team. The left  ventricular mass is highly atypical for either thrombus or vegetation and  the location is unusual for myxoma or papillary fibroelastoma. Its  mobility suggests significant embolic  potential. While TEE would offer better resolution, it may not add  diagnostic value. Consider cardiac MRI.   Patient Profile     75 y.o. female with a history of anemia, borderline hypothyroidism, and remote tobacco abuse (quit in early 2000s) but no known cardiac history who was admitted on 12/05/2020 for altered mental status and concern for acute stroke. Head CT negative for acute stroke but did show chronic appearing lacunar infarct. 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  We discussed the risks and discussed her stroke risk.  Showed images to her and her family.  She will talk with family; presently we will prepare for TEE tomorrow  Altered Mental Status Hx of Stroke - Felt to be due to severe hypothyroidism; has largelt resolved - Management per primary team and Neurology.  Severe Hypothyroidism - TSH 41. - Management per primary team.  Hyponatremia - Management per primary team.  Hypokalemia - Being supplemented by primary team. - Continue to monitor.  Hyperlipidemia - Continue high-intensity statin.  For questions or updates, please contact [facility] Please consult [email or web address] for contact info under        Signed, Christell Constant, MD  12/10/2020, 12:27 PM

## 2020-12-10 NOTE — Progress Notes (Signed)
PROGRESS NOTE    Jane Navarro  AOZ:[id number] DOB: March 16, 1946 DOA: 12/05/2020 PCP: Brooke Bonito, MD   Brief Narrative: 75 year old past medical history significant for anemia, tobacco use in the past, presents into the hospital after being found altered in her car in the [employer] driving a lot.  She apparently went in, did grocery shopping and then got back into her car and passed out.  EMS was called and she was brought to the hospital.  Family spoke to her 6 days prior to admission and she seems to be in her normal state of health.  Patient does not have a PCP and does not follows with doctors.  On admission she underwent an LP and was placed on empiric acyclovir.  She was also found to be hypothyroid and also have an intracardiac mobile mass.  Neurology and cardiology consulted and following.   Assessment & Plan:   Principal Problem:   Acute metabolic encephalopathy Active Problems:   Hypertensive urgency   CKD (chronic kidney disease), stage III (HCC)   Normocytic anemia   Transaminitis   Cerebral thrombosis with cerebral infarction   1-Severe Hypothyroidism: Patient presented with altered mental status, TSH 41.4. -Thyroid ultrasound was unable to visualize the thyroid, in the absence of previous thyroidectomy this will suggest marked parenchymal atrophy. -Continue with IV Synthroid, will consider transition to oral tomorrow if she consistently is taking oral. -She will need repeat TSH in 3 to 4 weeks.  2-Acute Metabolic Encephalopathy:  -Likely related to untreated hypothyroidism.  Also Sundowning, mimicking dementia but is likely induced by untreated prolonged hypothyroidism. -LP: CSF no growth on culture.  HSV PCR CSF negative.  IV acyclovir has been discontinued. -MRI showed acute small stroke left cingulate gyrus. -Neurology inform of MRI  results.  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 -Cardiology following. -Blood cultures no growth to date. -Currently on heparin drip. -Cardia MRI, thrombus vs Rhabdomyoma.  Plan for TEE tomorrow.   4-Hypokalemia: Replaced.  5-Lipidemia LDL 327.  Continue with statin 6-probably essential hypertension: She is hypertensive at the time that she is agitated.  Normotensive otherwise.  Abdominal distention:  -KUB; Diffuse gaseous distension of the colon with dilatation of the transverse colon/right colon. Findings may related to colonic ileus, partial colonic obstruction,or cecal volvulus in the appropriate clinical setting -CT abdomen: Subtle wall thickening of the cecum and ascending colon suspicious for colitis.  Colonic redundancy with mild distention with air and stool query constipation of colonic dysmotility.  Umbilical hernia contains fat and antimesenteric border of the transverse colon, no associated wall thickening or inflammation.  Sigmoid colonic diverticulosis without diverticulitis. Plan for miralax, and dulcolax suppository. She had BM yesterday, abdomen less distended. No sign of infection. Discussed with family, she will need follow up with GI.   B12 deficiency: Continue with supplements CKD stage IIIa: Creatinine at baseline Hyponatremia: Likely hypothyroidism induced.Stable.  Hypokalemia; Replaced.  Transaminases: Pattern that raises suspicious for EtOH use.  Patient used to drink in the past, family is not aware that she is currently drinking. Stable on statins.     Estimated body mass index is 27.64 kg/m as calculated from the following:   Height as of this encounter: 5' (1.524 m).   Weight as of this encounter: 64.2 kg.   DVT prophylaxis:  On heparin drip Code Status: Full code Family Communication: Son at bedside Disposition Plan:  Status is: Inpatient  Remains inpatient appropriate  because:IV treatments appropriate due to intensity of illness or inability to take PO  Dispo: The patient is from: Home              Anticipated d/c is to: SNF              Patient currently is not medically stable to d/c.   Difficult to place patient No        Consultants:  Neurology Cardiology  Procedures:  Echo: Showed mobile mass  Antimicrobials:    Subjective: She is alert, answer questions more appropriately. Not at baseline  Objective: Vitals:   12/10/20 0047 12/10/20 0401 12/10/20 0906 12/10/20 1102  BP: 133/81 (!) 143/94 122/84 (!) 144/90  Pulse: 68 66 76 71  Resp: 16 15 14 18   Temp: 98.5 F (36.9 C) 98.3 F (36.8 C) 98.9 F (37.2 C) 99.5 F (37.5 C)  TempSrc: Oral Oral Oral Oral  SpO2: 98% 96% 95% 96%  Weight:      Height:        Intake/Output Summary (Last 24 hours) at 12/10/2020 1403 Last data filed at 12/10/2020 0900 Gross per 24 hour  Intake 773.53 ml  Output 200 ml  Net 573.53 ml    Filed Weights   12/05/20 1200  Weight: 64.2 kg    Examination:  General exam: NAD Respiratory system: CTA Cardiovascular system: S 1, S 2 RRR Gastrointestinal system: BS present, soft, nt Central nervous system: Alert, oriented.  Extremities: no edema   Data Reviewed: I have personally reviewed following labs and imaging studies  CBC: Recent Labs  Lab 12/05/20 1247 12/05/20 1252 12/06/20 0138 12/07/20 0222 12/08/20 0108 12/09/20 0345 12/09/20 0501 12/10/20 0803  WBC 5.8  --  8.1 8.6 6.1 4.4 5.5 5.7  NEUTROABS 3.9  --  7.1  --   --   --   --   --   HGB 10.6*   < > 11.8* 11.8* 10.6* 8.0* 9.9* 9.8*  HCT 32.6*   < > 34.1* 34.4* 31.1* 24.0* 28.7* 30.0*  MCV 97.3  --  93.2 93.2 94.0 94.5 94.1 97.4  PLT 201  --  174 174 165 141* 147* 171   < > = values in this interval not displayed.    Basic Metabolic Panel: Recent Labs  Lab 12/07/20 0222 12/08/20 0108 12/09/20 0345 12/09/20 0501 12/10/20 0803  NA 128* 128* 111* 132* 135  K 3.7 3.1* 2.4*  3.2* 3.7  CL 96* 99 87* 104 108  CO2 23 20* 18* 21* 20*  GLUCOSE 83 73 828* 85 79  BUN 13 16 9 12 9   CREATININE 1.15* 1.13* 0.91 1.05* 1.02*  CALCIUM 9.5 8.9 6.8* 8.6* 9.0  MG 2.2  --   --   --   --   PHOS 3.0  --   --   --   --     GFR: Estimated Creatinine Clearance: 40.5 mL/min (A) (by C-G formula based on SCr of 1.02 mg/dL (H)). Liver Function Tests: Recent Labs  Lab 12/05/20 1247 12/06/20 0138 12/07/20 0222 12/08/20 0108  AST 68* 90* 109* 86*  ALT 22 26 26 21   ALKPHOS 66 72 76 68  BILITOT 0.4 0.5 0.6 0.6  PROT 7.2 7.5 7.5 6.3*  ALBUMIN 4.0 4.1 3.9 3.3*    No results for input(s): LIPASE, AMYLASE in the last 168 hours. Recent  Labs  Lab 12/05/20 1952  AMMONIA 13    Coagulation Profile: Recent Labs  Lab 12/05/20 1247  INR 1.0    Cardiac Enzymes: No results for input(s): CKTOTAL, CKMB, CKMBINDEX, TROPONINI in the last 168 hours. BNP (last 3 results) No results for input(s): PROBNP in the last 8760 hours. HbA1C: No results for input(s): HGBA1C in the last 72 hours. CBG: Recent Labs  Lab 12/05/20 1248 12/09/20 0451  GLUCAP 110* 94    Lipid Profile: No results for input(s): CHOL, HDL, LDLCALC, TRIG, CHOLHDL, LDLDIRECT in the last 72 hours. Thyroid Function Tests: No results for input(s): TSH, T4TOTAL, FREET4, T3FREE, THYROIDAB in the last 72 hours. Anemia Panel: No results for input(s): VITAMINB12, FOLATE, FERRITIN, TIBC, IRON, RETICCTPCT in the last 72 hours.  Sepsis Labs: Recent Labs  Lab 12/05/20 1953  LATICACIDVEN 1.3     Recent Results (from the past 240 hour(s))  CSF culture w Stat Gram Stain     Status: None   Collection Time: 12/05/20  3:50 PM   Specimen: CSF; Cerebrospinal Fluid  Result Value Ref Range Status   Specimen Description CSF  Final   Special Requests NONE  Final   Gram Stain   Final    WBC PRESENT, PREDOMINANTLY MONONUCLEAR NO ORGANISMS SEEN CYTOSPIN SMEAR    Culture   Final    NO GROWTH 3 DAYS Performed at [facility], [address] [place], [place] [address]    Report Status 12/09/2020 FINAL  Final  SARS CORONAVIRUS 2 (TAT 6-24 HRS) Nasopharyngeal Nasopharyngeal Swab     Status: None   Collection Time: 12/05/20  6:47 PM   Specimen: Nasopharyngeal Swab  Result Value Ref Range Status   SARS Coronavirus 2 NEGATIVE NEGATIVE Final    Comment: (NOTE) SARS-CoV-2 target nucleic acids are NOT DETECTED.  The SARS-CoV-2 RNA is generally detectable in upper and lower respiratory specimens during the acute phase of infection. Negative results do not preclude SARS-CoV-2 infection, do not rule out co-infections with other pathogens, and should not be used as the sole basis for treatment or other patient management decisions. Negative results must be combined with clinical observations, patient history, and epidemiological information. The expected result is Negative.  Fact Sheet for Patients: HairSlick.no  Fact Sheet for Healthcare Providers: quierodirigir.com  This test is not yet approved or cleared by the [place] FDA and  has been authorized for detection and/or diagnosis of SARS-CoV-2 by FDA under an Emergency Use Authorization (EUA). This EUA will remain  in effect (meaning this test can be used) for the duration of the COVID-19 declaration under Se ction 564(b)(1) of the Act, 21 U.S.C. section 360bbb-3(b)(1), unless the authorization is terminated or revoked sooner.  Performed at [facility], [address], [place], [place] [address]   Culture, blood (Routine X 2) w Reflex to ID Panel     Status: None (Preliminary result)   Collection Time: 12/06/20  4:03 PM   Specimen: BLOOD RIGHT HAND  Result Value Ref Range Status   Specimen Description BLOOD RIGHT HAND  Final   Special Requests   Final    BOTTLES DRAWN AEROBIC ONLY Blood Culture adequate volume   Culture   Final    NO GROWTH 4 DAYS Performed at [facility], [address] [place], [place] [address]    Report Status PENDING  Incomplete  Culture, blood (Routine X 2) w Reflex to ID Panel     Status: None (Preliminary  result)   Collection Time: 12/06/20  4:12 PM   Specimen: BLOOD LEFT HAND  Result Value Ref Range Status   Specimen Description BLOOD LEFT HAND  Final   Special Requests   Final    BOTTLES DRAWN AEROBIC ONLY Blood Culture adequate volume   Culture   Final    NO GROWTH 4 DAYS Performed at [facility], [address] [place], [place] [address]    Report Status PENDING  Incomplete          Radiology Studies: CT ANGIO HEAD NECK W WO CM  Result Date: 12/09/2020 CLINICAL DATA:  Stroke, follow-up EXAM: CT ANGIOGRAPHY HEAD AND NECK TECHNIQUE: Multidetector CT imaging of the head and neck was performed using the standard protocol during bolus administration of intravenous contrast. Multiplanar CT image reconstructions and MIPs were obtained to evaluate the vascular anatomy. Carotid stenosis measurements (when applicable) are obtained utilizing NASCET criteria, using the distal internal carotid diameter as the denominator. CONTRAST:  OMNIPAQUE IOHEXOL 350 MG/ML SOLN COMPARISON:  None. FINDINGS: CT HEAD Brain: There is no acute intracranial hemorrhage or mass effect. Small infarct on MRI is not visible. Patchy and confluent areas of low-density in the supratentorial white matter likely reflect chronic microvascular ischemic changes. 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 Vascular: No hyperdense vessel. Skull: No new finding. Sinuses/Orbits: No acute finding. Other: None. Review of the MIP images confirms the above findings CTA NECK Aortic arch: There is mixed plaque along the arch. Great vessel origins are patent. There is noncalcified plaque along the proximal subclavian artery origins. Right carotid system: Patent. Mild atherosclerotic wall thickening along the common carotid. Mixed plaque along the proximal  internal carotid causing less than 50% stenosis. Left carotid system: Patent. Mixed plaque along the common carotid with less than 50% stenosis. Mixed plaque along the proximal internal carotid less than 50% stenosis. Vertebral arteries: Patent. Left vertebral artery arises directly from the arch. Plaque causes at least mild stenosis at the left origin. Right vertebral artery is dominant. Skeleton: Degenerative changes of the cervical spine. Other neck: Unremarkable. Upper chest: Trace pleural effusions. Review of the MIP images confirms the above findings CTA HEAD Anterior circulation: Intracranial internal carotid arteries are patent with minimal calcified plaque. Anterior cerebral arteries are patent. Left A1 ACA is dominant. Middle cerebral arteries are patent. There is mild stenosis of the left M1 MCA. Posterior circulation: Intracranial vertebral arteries are patent. Basilar artery is patent. Major cerebellar artery origins are patent. A right posterior communicating artery is present. Posterior cerebral arteries are patent. Mild bilateral P2 PCA stenoses. Venous sinuses: Patent as allowed by contrast bolus timing. Review of the MIP images confirms the above findings IMPRESSION: No acute intracranial abnormality. Small infarct on MRI is not visible. Plaque at the common and proximal internal carotid arteries with less than 50% stenosis. Mild intracranial atherosclerosis. Electronically Signed   By: Guadlupe Spanish M.D.   On: 12/09/2020 18:24   DG Abd 1 View  Result Date: 12/09/2020 CLINICAL DATA:  Abdominal distension. EXAM: ABDOMEN - 1 VIEW COMPARISON:  CT abdomen and pelvis 12/16/2014. FINDINGS: There is gaseous distension of large and small bowel diffusely to the level of the sigmoid colon. Colonic loops in the mid abdomen are dilated measuring up to 8.6 cm in diameter. Small bowel loops are prominent, but not dilated by size criteria. There are rounded calcifications in the pelvis favored as phleboliths  when compared to prior CT. The visualized lung bases are clear.  No acute fractures are identified. IMPRESSION: 1. Diffuse gaseous distension of the colon with dilatation of the transverse colon/right colon. This is of uncertain etiology. Findings may related to colonic ileus, partial colonic obstruction, or cecal volvulus in the appropriate clinical setting. Further evaluation with CT should be considered. Electronically Signed   By: Darliss Cheney M.D.   On: 12/09/2020 16:04   MR BRAIN WO CONTRAST  Result Date: 12/08/2020 CLINICAL DATA:  Mental status change, unknown cause. EXAM: MRI HEAD WITHOUT CONTRAST TECHNIQUE: Multiplanar, multiecho pulse sequences of the brain and surrounding structures were obtained without intravenous contrast. COMPARISON:  Head CT 12/05/2020 FINDINGS: The study is mildly motion degraded. Brain: There is a 1 cm acute infarct involving the mid to posterior aspect of the left cingulate gyrus. Patchy T2 hyperintensities in the cerebral white matter bilaterally are nonspecific but compatible with moderate chronic small vessel ischemic disease. There is a chronic lacunar infarct involving the body of the right caudate nucleus. A chronic microhemorrhage is noted in the region of the left external capsule. There is mild-to-moderate cerebral atrophy. No mass, midline shift, or extra-axial fluid collection is identified. Vascular: Major intracranial vascular flow voids are preserved. Skull and upper cervical spine: Unremarkable bone marrow signal. Sinuses/Orbits: Unremarkable orbits. Minimal bilateral ethmoid air cell mucosal thickening. Trace left mastoid fluid. Other: None. IMPRESSION: 1. Small acute infarct in the left cingulate gyrus. 2. Moderate chronic small vessel ischemic disease. Electronically Signed   By: Sebastian Ache M.D.   On: 12/08/2020 18:40   CT ABDOMEN PELVIS W CONTRAST  Result Date: 12/09/2020 CLINICAL DATA:  Abdominal distension Recent stroke. EXAM: CT ABDOMEN AND PELVIS  WITH CONTRAST TECHNIQUE: Multidetector CT imaging of the abdomen and pelvis was performed using the standard protocol following bolus administration of intravenous contrast. CONTRAST:  OMNIPAQUE IOHEXOL 350 MG/ML SOLN COMPARISON:  CT 12/16/2014 FINDINGS: Lower chest: Trace bilateral pleural effusions. Associated basilar atelectasis. Atherosclerosis and tortuosity of the descending thoracic aorta. Small pericardial effusion adjacent to the right heart border. Hepatobiliary: Scattered small hepatic hypodensities that are not significantly changed from prior exam. Largest measures 16 mm in the left lobe and is consistent with simple cyst. Majority of these lesions are too small to accurately characterize. Unremarkable gallbladder. No pericholecystic inflammation or biliary dilatation. Pancreas: Unremarkable. No pancreatic ductal dilatation or surrounding inflammatory changes. Spleen: Normal in size without focal abnormality. Adrenals/Urinary Tract: No adrenal nodule. Mild symmetric perinephric edema which was also present on prior exam. No hydronephrosis. Tiny cortical hypodensity in the posterior left kidney is too small to characterize but likely cyst. No suspicious renal lesion. No visualized renal stone. Decompressed ureters. Partially distended urinary bladder. No definite bladder wall thickening. Stomach/Bowel: Detailed bowel assessment is limited in the absence of enteric contrast. There is a small hiatal hernia. The stomach is decompressed, equivocal gastric wall thickening versus nondistention. No small bowel obstruction or inflammation. Slight increased air throughout nondilated small bowel. Normal appendix courses into the right pericolic gutter under the liver tip. There is subtle wall thickening of the cecum and ascending colon, series 9, images 24 and 40. Mild gaseous distension of transverse colon. Anti mesenteric border of transverse colon extends into an umbilical hernia, no associated wall  thickening or inflammation. Moderate stool in the left colon. Sigmoid colon is redundant and courses into the central abdomen. Sigmoid colonic diverticulosis. No diverticulitis. Mild rectal wall thickening. Small volume of stool in the rectum. Vascular/Lymphatic: Moderate aortic atherosclerosis. No aortic aneurysm. Patent portal vein. No enlarged lymph nodes  in the abdomen or pelvis. Reproductive: Status post hysterectomy. No adnexal masses. Other: Umbilical hernia contains fat and anti mesenteric border of the transverse colon. Trace fluid in the aneurysm sac. No ascites or free air. Musculoskeletal: Degenerative change in the lumbar spine is most prominent at L5-S1. There are no acute or suspicious osseous abnormalities. IMPRESSION: 1. Subtle wall thickening of the cecum and ascending colon, suspicious for colitis. 2. Colonic redundancy with mild distention with air and stool, query constipation and colonic dysmotility. 3. Umbilical hernia contains fat and anti mesenteric border of the transverse colon, no associated wall thickening or inflammation. 4. Sigmoid colonic diverticulosis without diverticulitis. 5. Trace bilateral pleural effusions. Small pericardial effusion adjacent to the right heart border. 6. Scattered cysts in the liver as well as low-density lesions too small to characterize. Aortic Atherosclerosis (ICD10-I70.0). Electronically Signed   By: Narda RutherfordMelanie  Sanford M.D.   On: 12/09/2020 19:40   MR CARDIAC MORPHOLOGY W WO CONTRAST  Result Date: 12/09/2020 CLINICAL DATA:  Clinical question of papillary mass EXAM: CARDIAC MRI TECHNIQUE: The patient was scanned on a 1.5 Tesla GE magnet. A dedicated cardiac coil was used. Functional imaging was done using Fiesta sequences. 2,3, and 4 chamber views were done to assess for RWMA's. Modified Simpson's rule using a short axis stack was used to calculate an ejection fraction on a dedicated work Research officer, trade unionstation using Circle software. The patient received 10 cc of  Gadavist. After 10 minutes inversion recovery sequences were used to assess for infiltration and scar tissue. CONTRAST:  10 cc  of Gadavist FINDINGS: 1. Normal left ventricular size, with LVEDD 37 mm, and LVEDVi 54 mL/m2. Normal left ventricular thickness, with intraventricular septal thickness of 13 mm, posterior wall thickness of 9 mm, and septal to posterior ratio < 1.5; myocardial mass index 56 g/m2. Normal left ventricular systolic function (LVEF =60%). There are no regional wall motion abnormalities. Left ventricular parametric mapping notable for elevated native T1 signal (basal inferior and inferolateral 1260 ms) and grossly elevated T2 signal (60 ms). There is no late gadolinium enhancement in the left ventricular myocardium meeting 5 standard deviation from normal criteria. Anterolateral papillary muscle appears apically displaced and appears bifid in the short axis. No evidence of significant hypertrophy. The is no systolic anterior motion of the mitral valve. Papillary muscle is isointense on gradient echo not suggestive of calcification. Papillary muscle is isointense to myocardium on LGE sequences, not suggestive of thrombus. Papillary muscle is isointense to myocardium on T2 signal, not suggestive of myxoma. Papillary muscle perfuses with the rest of the myocardium. 2. Normal right ventricular size with RVEDVI 60 mL/m2. Normal right ventricular thickness. Normal right ventricular systolic function (RVEF =67 %). There are no regional wall motion abnormalities or aneurysms. 3.  Normal left and right atrial size. 4. Normal size of the aortic root, ascending aorta and pulmonary artery. 5.  No significant valvular abnormalities. 6.  Normal pericardium.  No pericardial effusion. 7. Grossly, no extracardiac findings. Recommended dedicated study if concerned for non-cardiac pathology. 8. Significant breathhold artifact through entire study. This decreases the accuracy of volumetric assessment, LGE assessment,  and that of parametric mapping. IMPRESSION: Significant artifact. Anterolateral papillary muscle appears apically displaced and appears bifid in the short axis. Rhabdomyoma and very small thrombus below the resolution of CMR cannot be excluded. Riley LamMahesh Chandrasekhar MD Electronically Signed   By: Riley LamMahesh   Chandrasekhar M.D.   On: 12/09/2020 20:15   ECHOCARDIOGRAM LIMITED  Result Date: 12/08/2020    ECHOCARDIOGRAM LIMITED REPORT  Patient Name:   TEKEYA GEFFERT Date of Exam: 12/08/2020 Medical Rec #:  [id number]    Height:       60.0 in Accession #:    [id number]   Weight:       141.5 lb Date of Birth:  1945/04/24    BSA:          1.612 m Patient Age:    16 years     BP:           121/86 mmHg Patient Gender: F            HR:           66 bpm. Exam Location:  Inpatient Procedure: Limited Echo, Limited Color Doppler and Cardiac Doppler Indications:    Reassess LV mass  History:        Patient has prior history of Echocardiogram examinations, most                 recent 12/06/2020. Signs/Symptoms:Altered Mental Status; Risk                 Factors:Current Smoker.  Sonographer:    Delcie Roch RDCS Referring Phys: [id number] CALLIE E GOODRICH IMPRESSIONS  1. 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 It may be slightly smaller in size but is essentially unchanged from previous study. Kristeen Miss MD Electronically signed by Kristeen Miss MD Signature Date/Time: 12/08/2020/4:01:37 PM    Final         Scheduled Meds:  atorvastatin  80 mg Oral Daily   levothyroxine  50 mcg Intravenous Daily   polyethylene glycol  17 g Oral Daily   sodium chloride flush  3 mL Intravenous Q12H   [START ON 12/13/2020] thiamine injection  100 mg Intravenous Daily   Continuous Infusions:  heparin 850 Units/hr (12/09/20 1639)   thiamine injection 250 mg (12/10/20 0938)     LOS: 5 days    Time spent: 35 minutes.     Alba Cory, MD [facility]   If 7PM-7AM, please contact  night-coverage [email or web address]  12/10/2020, 2:03 PM

## 2020-12-10 NOTE — Progress Notes (Signed)
STROKE TEAM PROGRESS NOTE   SUBJECTIVE (INTERVAL HISTORY) Her two sons are at the bedside.  Patient is sitting in chair for lunch.  No acute neuro changes, no acute event overnight.  CT head and neck unremarkable.  Pending TEE tomorrow.   OBJECTIVE Temp:  [97.6 F (36.4 C)-99.5 F (37.5 C)] 98.6 F (37 C) (09/08 1450) Pulse Rate:  [66-76] 66 (09/08 1450) Cardiac Rhythm: Normal sinus rhythm (09/08 0700) Resp:  [14-18] 18 (09/08 1450) BP: (117-144)/(67-94) 130/67 (09/08 1450) SpO2:  [95 %-98 %] 96 % (09/08 1450)  Recent Labs  Lab 12/05/20 1248 12/09/20 0451  GLUCAP 110* 94   Recent Labs  Lab 12/07/20 0222 12/08/20 0108 12/09/20 0345 12/09/20 0501 12/10/20 0803  NA 128* 128* 111* 132* 135  K 3.7 3.1* 2.4* 3.2* 3.7  CL 96* 99 87* 104 108  CO2 23 20* 18* 21* 20*  GLUCOSE 83 73 828* 85 79  BUN CREATININE 1.15* 1.13* 0.91 1.05* 1.02*  CALCIUM 9.5 8.9 6.8* 8.6* 9.0  MG 2.2  --   --   --   --   PHOS 3.0  --   --   --   --    Recent Labs  Lab 12/05/20 1247 12/06/20 0138 12/07/20 0222 12/08/20 0108  AST 68* 90* 109* 86*  ALT ALKPHOS 66 72 76 68  BILITOT 0.4 0.5 0.6 0.6  PROT 7.2 7.5 7.5 6.3*  ALBUMIN 4.0 4.1 3.9 3.3*   Recent Labs  Lab 12/05/20 1247 12/05/20 1252 12/06/20 0138 12/07/20 0222 12/08/20 0108 12/09/20 0345 12/09/20 0501 12/10/20 0803  WBC 5.8  --  8.1 8.6 6.1 4.4 5.5 5.7  NEUTROABS 3.9  --  7.1  --   --   --   --   --   HGB 10.6*   < > 11.8* 11.8* 10.6* 8.0* 9.9* 9.8*  HCT 32.6*   < > 34.1* 34.4* 31.1* 24.0* 28.7* 30.0*  MCV 97.3  --  93.2 93.2 94.0 94.5 94.1 97.4  PLT 201  --  174 174 165 141* 147* 171   < > = values in this interval not displayed.   No results for input(s): CKTOTAL, CKMB, CKMBINDEX, TROPONINI in the last 168 hours. No results for input(s): LABPROT, INR in the last 72 hours. No results for input(s): COLORURINE, LABSPEC, PHURINE, GLUCOSEU, HGBUR, BILIRUBINUR, KETONESUR, PROTEINUR, UROBILINOGEN,  NITRITE, LEUKOCYTESUR in the last 72 hours.  Invalid input(s): APPERANCEUR     Component Value Date/Time   CHOL 429 (H) 12/06/2020 0138   TRIG 258 (H) 12/06/2020 0138   HDL 50 12/06/2020 0138   CHOLHDL 8.6 12/06/2020 0138   VLDL 52 (H) 12/06/2020 0138   LDLCALC 327 (H) 12/06/2020 0138   Lab Results  Component Value Date   HGBA1C 5.5 12/05/2020      Component Value Date/Time   LABOPIA NONE DETECTED 12/05/2020 1600   COCAINSCRNUR NONE DETECTED 12/05/2020 1600   LABBENZ NONE DETECTED 12/05/2020 1600   AMPHETMU NONE DETECTED 12/05/2020 1600   THCU NONE DETECTED 12/05/2020 1600   LABBARB NONE DETECTED 12/05/2020 1600    Recent Labs  Lab 12/05/20 1957  ETH <10    I have personally reviewed the radiological images below and agree with the radiology interpretations.  CT ANGIO HEAD NECK W WO CM  Result Date: 12/09/2020 CLINICAL DATA:  Stroke, follow-up EXAM: CT ANGIOGRAPHY HEAD AND NECK TECHNIQUE: Multidetector CT imaging of the head and neck was  performed using the standard protocol during bolus administration of intravenous contrast. Multiplanar CT image reconstructions and MIPs were obtained to evaluate the vascular anatomy. Carotid stenosis measurements (when applicable) are obtained utilizing NASCET criteria, using the distal internal carotid diameter as the denominator. CONTRAST:  100mL OMNIPAQUE IOHEXOL 350 MG/ML SOLN COMPARISON:  None. FINDINGS: CT HEAD Brain: There is no acute intracranial hemorrhage or mass effect. Small infarct on MRI is not visible. Patchy and confluent areas of low-density in the supratentorial white matter likely reflect chronic microvascular ischemic changes. 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 Vascular: No hyperdense vessel. Skull: No new finding. Sinuses/Orbits: No acute finding. Other: None. Review of the MIP images confirms the above findings CTA NECK Aortic arch: There is mixed plaque along the arch. Great vessel origins are patent. There is  noncalcified plaque along the proximal subclavian artery origins. Right carotid system: Patent. Mild atherosclerotic wall thickening along the common carotid. Mixed plaque along the proximal internal carotid causing less than 50% stenosis. Left carotid system: Patent. Mixed plaque along the common carotid with less than 50% stenosis. Mixed plaque along the proximal internal carotid less than 50% stenosis. Vertebral arteries: Patent. Left vertebral artery arises directly from the arch. Plaque causes at least mild stenosis at the left origin. Right vertebral artery is dominant. Skeleton: Degenerative changes of the cervical spine. Other neck: Unremarkable. Upper chest: Trace pleural effusions. Review of the MIP images confirms the above findings CTA HEAD Anterior circulation: Intracranial internal carotid arteries are patent with minimal calcified plaque. Anterior cerebral arteries are patent. Left A1 ACA is dominant. Middle cerebral arteries are patent. There is mild stenosis of the left M1 MCA. Posterior circulation: Intracranial vertebral arteries are patent. Basilar artery is patent. Major cerebellar artery origins are patent. A right posterior communicating artery is present. Posterior cerebral arteries are patent. Mild bilateral P2 PCA stenoses. Venous sinuses: Patent as allowed by contrast bolus timing. Review of the MIP images confirms the above findings IMPRESSION: No acute intracranial abnormality. Small infarct on MRI is not visible. Plaque at the common and proximal internal carotid arteries with less than 50% stenosis. Mild intracranial atherosclerosis. Electronically Signed   By: Guadlupe SpanishPraneil  Patel M.D.   On: 12/09/2020 18:24   DG Abd 1 View  Result Date: 12/09/2020 CLINICAL DATA:  Abdominal distension. EXAM: ABDOMEN - 1 VIEW COMPARISON:  CT abdomen and pelvis 12/16/2014. FINDINGS: There is gaseous distension of large and small bowel diffusely to the level of the sigmoid colon. Colonic loops in the mid  abdomen are dilated measuring up to 8.6 cm in diameter. Small bowel loops are prominent, but not dilated by size criteria. There are rounded calcifications in the pelvis favored as phleboliths when compared to prior CT. The visualized lung bases are clear. No acute fractures are identified. IMPRESSION: 1. Diffuse gaseous distension of the colon with dilatation of the transverse colon/right colon. This is of uncertain etiology. Findings may related to colonic ileus, partial colonic obstruction, or cecal volvulus in the appropriate clinical setting. Further evaluation with CT should be considered. Electronically Signed   By: Darliss CheneyAmy  Guttmann M.D.   On: 12/09/2020 16:04   MR BRAIN WO CONTRAST  Result Date: 12/08/2020 CLINICAL DATA:  Mental status change, unknown cause. EXAM: MRI HEAD WITHOUT CONTRAST TECHNIQUE: Multiplanar, multiecho pulse sequences of the brain and surrounding structures were obtained without intravenous contrast. COMPARISON:  Head CT 12/05/2020 FINDINGS: The study is mildly motion degraded. Brain: There is a 1 cm acute infarct involving the mid  to posterior aspect of the left cingulate gyrus. Patchy T2 hyperintensities in the cerebral white matter bilaterally are nonspecific but compatible with moderate chronic small vessel ischemic disease. There is a chronic lacunar infarct involving the body of the right caudate nucleus. A chronic microhemorrhage is noted in the region of the left external capsule. There is mild-to-moderate cerebral atrophy. No mass, midline shift, or extra-axial fluid collection is identified. Vascular: Major intracranial vascular flow voids are preserved. Skull and upper cervical spine: Unremarkable bone marrow signal. Sinuses/Orbits: Unremarkable orbits. Minimal bilateral ethmoid air cell mucosal thickening. Trace left mastoid fluid. Other: None. IMPRESSION: 1. Small acute infarct in the left cingulate gyrus. 2. Moderate chronic small vessel ischemic disease. Electronically  Signed   By: Sebastian Ache M.D.   On: 12/08/2020 18:40   CT ABDOMEN PELVIS W CONTRAST  Result Date: 12/09/2020 CLINICAL DATA:  Abdominal distension Recent stroke. EXAM: CT ABDOMEN AND PELVIS WITH CONTRAST TECHNIQUE: Multidetector CT imaging of the abdomen and pelvis was performed using the standard protocol following bolus administration of intravenous contrast. CONTRAST:  OMNIPAQUE IOHEXOL 350 MG/ML SOLN COMPARISON:  CT 12/16/2014 FINDINGS: Lower chest: Trace bilateral pleural effusions. Associated basilar atelectasis. Atherosclerosis and tortuosity of the descending thoracic aorta. Small pericardial effusion adjacent to the right heart border. Hepatobiliary: Scattered small hepatic hypodensities that are not significantly changed from prior exam. Largest measures 16 mm in the left lobe and is consistent with simple cyst. Majority of these lesions are too small to accurately characterize. Unremarkable gallbladder. No pericholecystic inflammation or biliary dilatation. Pancreas: Unremarkable. No pancreatic ductal dilatation or surrounding inflammatory changes. Spleen: Normal in size without focal abnormality. Adrenals/Urinary Tract: No adrenal nodule. Mild symmetric perinephric edema which was also present on prior exam. No hydronephrosis. Tiny cortical hypodensity in the posterior left kidney is too small to characterize but likely cyst. No suspicious renal lesion. No visualized renal stone. Decompressed ureters. Partially distended urinary bladder. No definite bladder wall thickening. Stomach/Bowel: Detailed bowel assessment is limited in the absence of enteric contrast. There is a small hiatal hernia. The stomach is decompressed, equivocal gastric wall thickening versus nondistention. No small bowel obstruction or inflammation. Slight increased air throughout nondilated small bowel. Normal appendix courses into the right pericolic gutter under the liver tip. There is subtle wall thickening of the cecum  and ascending colon, series 9, images 24 and 40. Mild gaseous distension of transverse colon. Anti mesenteric border of transverse colon extends into an umbilical hernia, no associated wall thickening or inflammation. Moderate stool in the left colon. Sigmoid colon is redundant and courses into the central abdomen. Sigmoid colonic diverticulosis. No diverticulitis. Mild rectal wall thickening. Small volume of stool in the rectum. Vascular/Lymphatic: Moderate aortic atherosclerosis. No aortic aneurysm. Patent portal vein. No enlarged lymph nodes in the abdomen or pelvis. Reproductive: Status post hysterectomy. No adnexal masses. Other: Umbilical hernia contains fat and anti mesenteric border of the transverse colon. Trace fluid in the aneurysm sac. No ascites or free air. Musculoskeletal: Degenerative change in the lumbar spine is most prominent at L5-S1. There are no acute or suspicious osseous abnormalities. IMPRESSION: 1. Subtle wall thickening of the cecum and ascending colon, suspicious for colitis. 2. Colonic redundancy with mild distention with air and stool, query constipation and colonic dysmotility. 3. Umbilical hernia contains fat and anti mesenteric border of the transverse colon, no associated wall thickening or inflammation. 4. Sigmoid colonic diverticulosis without diverticulitis. 5. Trace bilateral pleural effusions. Small pericardial effusion adjacent to the right heart border. 6.  Scattered cysts in the liver as well as low-density lesions too small to characterize. Aortic Atherosclerosis (ICD10-I70.0). Electronically Signed   By: Narda Rutherford M.D.   On: 12/09/2020 19:40   MR CARDIAC MORPHOLOGY W WO CONTRAST  Result Date: 12/09/2020 CLINICAL DATA:  Clinical question of papillary mass EXAM: CARDIAC MRI TECHNIQUE: The patient was scanned on a 1.5 Tesla GE magnet. A dedicated cardiac coil was used. Functional imaging was done using Fiesta sequences. 2,3, and 4 chamber views were done to assess  for RWMA's. Modified Simpson's rule using a short axis stack was used to calculate an ejection fraction on a dedicated work [employer]. The patient received 10 cc of Gadavist. After 10 minutes inversion recovery sequences were used to assess for infiltration and scar tissue. CONTRAST:  10 cc  of Gadavist FINDINGS: 1. Normal left ventricular size, with LVEDD 37 mm, and LVEDVi 54 mL/m2. Normal left ventricular thickness, with intraventricular septal thickness of 13 mm, posterior wall thickness of 9 mm, and septal to posterior ratio < 1.5; myocardial mass index 56 g/m2. Normal left ventricular systolic function (LVEF =60%). There are no regional wall motion abnormalities. Left ventricular parametric mapping notable for elevated native T1 signal (basal inferior and inferolateral 1260 ms) and grossly elevated T2 signal (60 ms). There is no late gadolinium enhancement in the left ventricular myocardium meeting 5 standard deviation from normal criteria. Anterolateral papillary muscle appears apically displaced and appears bifid in the short axis. No evidence of significant hypertrophy. The is no systolic anterior motion of the mitral valve. Papillary muscle is isointense on gradient echo not suggestive of calcification. Papillary muscle is isointense to myocardium on LGE sequences, not suggestive of thrombus. Papillary muscle is isointense to myocardium on T2 signal, not suggestive of myxoma. Papillary muscle perfuses with the rest of the myocardium. 2. Normal right ventricular size with RVEDVI 60 mL/m2. Normal right ventricular thickness. Normal right ventricular systolic function (RVEF =67 %). There are no regional wall motion abnormalities or aneurysms. 3.  Normal left and right atrial size. 4. Normal size of the aortic root, ascending aorta and pulmonary artery. 5.  No significant valvular abnormalities. 6.  Normal pericardium.  No pericardial effusion. 7. Grossly, no extracardiac findings.  Recommended dedicated study if concerned for non-cardiac pathology. 8. Significant breathhold artifact through entire study. This decreases the accuracy of volumetric assessment, LGE assessment, and that of parametric mapping. IMPRESSION: Significant artifact. Anterolateral papillary muscle appears apically displaced and appears bifid in the short axis. Rhabdomyoma and very small thrombus below the resolution of CMR cannot be excluded. Riley Lam MD Electronically Signed   By: Riley Lam M.D.   On: 12/09/2020 20:15   ECHOCARDIOGRAM COMPLETE  Result Date: 12/06/2020    ECHOCARDIOGRAM REPORT   Patient Name:   Jane Navarro Date of Exam: 12/06/2020 Medical Rec #:  [id number]    Height:       60.0 in Accession #:    [id number]   Weight:       141.5 lb Date of Birth:  Jan 28, 1946    BSA:          1.612 m Patient Age:    74 years     BP:           150/92 mmHg Patient Gender: F            HR:           72 bpm. Exam Location:  Inpatient Procedure: 2D Echo, Cardiac Doppler and  Color Doppler Indications:    Stroke I63.9  History:        Patient has no prior history of Echocardiogram examinations.                 Signs/Symptoms:Altered Mental Status; Risk Factors:Former                 Smoker. Hypertensive urgency. Hypothyroidism. Passed out in                 parking lot after shopping.  Sonographer:    Roosvelt Maser RDCS Referring Phys: [id number] [facility] IMPRESSIONS  1. 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 It measures approximately 13 mm in maximum length and 5 mm in width, with a relatively narrow attachment base. Left ventricular ejection fraction, by estimation, is 60 to 65%. The left ventricle has normal function. The left ventricle has no regional wall motion abnormalities. There is mild concentric left ventricular hypertrophy. Left ventricular diastolic  parameters are consistent with Grade I diastolic dysfunction (impaired relaxation). Elevated left atrial pressure.  2. Right ventricular systolic function is normal. The right ventricular size is normal.  3. The mitral valve is normal in structure. No evidence of mitral valve regurgitation. No evidence of mitral stenosis.  4. The aortic valve is normal in structure. Aortic valve regurgitation is not visualized. No aortic stenosis is present.  5. The inferior vena cava is normal in size with greater than 50% respiratory variability, suggesting right atrial pressure of 3 mmHg. Conclusion(s)/Recommendation(s): Discussed with primary team. The left ventricular mass is highly atypical for either thrombus or vegetation and the location is unusual for myxoma or papillary fibroelastoma. Its mobility suggests significant embolic potential. While TEE would offer better resolution, it may not add diagnostic value. Consider cardiac MRI. 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 It measures approximately 13 mm in maximum length and 5 mm in width, with a relatively narrow attachment base. Left ventricular ejection fraction, by estimation, is 60 to 65%. The left ventricle has normal function. The left ventricle has no regional wall motion abnormalities. The left ventricular internal cavity size was normal in size. There is mild concentric left ventricular hypertrophy. Left ventricular diastolic parameters are consistent with Grade I diastolic dysfunction (impaired relaxation). Elevated left atrial pressure. Right Ventricle: The right ventricular size is normal. No increase in right ventricular wall thickness. Right ventricular systolic function is normal. Left Atrium: Left atrial size was normal in size. Right Atrium: Right atrial size was normal in size. Pericardium: There is no  evidence of pericardial effusion. Mitral Valve: The mitral valve is normal in structure. No evidence of mitral valve regurgitation. No evidence of mitral valve stenosis. Tricuspid Valve: The tricuspid valve is normal in structure. Tricuspid valve regurgitation is not demonstrated. No evidence of tricuspid stenosis. Aortic Valve: The aortic valve is normal in structure. Aortic valve regurgitation is not visualized. No aortic stenosis is present. Aortic valve mean gradient measures 5.0 mmHg. Aortic valve peak gradient measures 8.1 mmHg. Aortic valve area, by VTI measures 1.71 cm. Pulmonic Valve: The pulmonic valve was normal in structure. Pulmonic valve regurgitation is not visualized. No evidence of pulmonic stenosis. Aorta: The aortic root is normal in size and structure. Venous: The inferior vena cava  is normal in size with greater than 50% respiratory variability, suggesting right atrial pressure of 3 mmHg. IAS/Shunts: No atrial level shunt detected by color flow Doppler.  LEFT VENTRICLE PLAX 2D LVIDd:         3.60 cm LVIDs:         2.60 cm  Diastology LV PW:         1.20 cm  LV e' medial:    3.97 cm/s LV IVS:        1.50 cm  LV E/e' medial:  16.8 LVOT diam:     1.70 cm  LV e' lateral:   5.35 cm/s LV SV:         49       LV E/e' lateral: 12.5 LV SV Index:   30 LVOT Area:     2.27 cm  RIGHT VENTRICLE RV Basal diam:  3.00 cm LEFT ATRIUM             Index       RIGHT ATRIUM           Index LA diam:        2.10 cm 1.30 cm/m  RA Area:     10.60 cm LA Vol (A2C):   36.7 ml 22.77 ml/m RA Volume:   21.10 ml  13.09 ml/m LA Vol (A4C):   21.6 ml 13.40 ml/m LA Biplane Vol: 30.1 ml 18.68 ml/m  AORTIC VALVE AV Area (Vmax):    1.65 cm AV Area (Vmean):   1.48 cm AV Area (VTI):     1.71 cm AV Vmax:           142.00 cm/s AV Vmean:          105.000 cm/s AV VTI:            0.285 m AV Peak Grad:      8.1 mmHg AV Mean Grad:      5.0 mmHg LVOT Vmax:         103.00 cm/s LVOT Vmean:        68.300 cm/s LVOT VTI:          0.215 m  LVOT/AV VTI ratio: 0.75  AORTA Ao Root diam: 2.40 cm MITRAL VALVE MV Area (PHT): 2.29 cm     SHUNTS MV Decel Time: 332 msec     Systemic VTI:  0.22 m MV E velocity: 66.80 cm/s   Systemic Diam: 1.70 cm MV A velocity: 109.00 cm/s MV E/A ratio:  0.61 Mihai Croitoru MD Electronically signed by Thurmon Fair MD Signature Date/Time: 12/06/2020/2:42:42 PM    Final    US THYROID  Result Date: 12/06/2020 CLINICAL DATA:  Goiter.  Elevated TSH EXAM: THYROID ULTRASOUND TECHNIQUE: Ultrasound examination of the thyroid gland and adjacent soft tissues was performed. COMPARISON:  None. FINDINGS: Parenchymal Echotexture: None identified Isthmus: Not visualized Right lobe: Not visualized Left lobe: Not visualized _________________________________________________________ Estimated total number of nodules >/= 1 cm: 0 Number of spongiform nodules >/=  2 cm not described below (TR1): 0 Number of mixed cystic and solid nodules >/= 1.5 cm not described below (TR2): 0 _________________________________________________________ No normal thyroid parenchyma was localized. No regional cervical adenopathy. IMPRESSION: Nonvisualization of thyroid. In the absence of previous thyroidectomy, this would suggest marked parenchymal atrophy. The above is in keeping with the ACR TI-RADS recommendations - J Am Coll Radiol 2017;14:587-595. Electronically Signed   By: Corlis Leak M.D.   On: 12/06/2020 15:53   CT HEAD CODE STROKE WO CONTRAST  Result Date: 12/05/2020 CLINICAL DATA:  Code stroke. Neuro deficit, acute, stroke suspected. EXAM: CT HEAD WITHOUT CONTRAST TECHNIQUE: Contiguous axial images were obtained from the base of the skull through the vertex without intravenous contrast. COMPARISON:  Report from head CT 11/21/1998 (images currently unavailable). FINDINGS: Brain: Cerebral volume is normal for age. 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 Background mild multifocal T2/FLAIR hyperintensity  within the cerebral white matter, nonspecific but compatible with chronic small vessel ischemic disease. There is no acute intracranial hemorrhage. No demarcated cortical infarct. No extra-axial fluid collection. No evidence of an intracranial mass. No midline shift. Vascular: No hyperdense vessel.  Atherosclerotic calcifications. Skull: Normal. Negative for fracture or focal lesion. Sinuses/Orbits: Visualized orbits show no acute finding. No significant paranasal sinus disease. ASPECTS [facility] Stroke Program Early CT Score) - Ganglionic level infarction (caudate, lentiform nuclei, internal capsule, insula, M1-M3 cortex): 7 - Supraganglionic infarction (M4-M6 cortex): 3 Total score (0-10 with 10 being normal): 10 (when discounting chronic infarcts). These results were communicated to Dr. Selina Cooley At 1:18 pmon 9/3/2022by text page via the [facility] messaging system. IMPRESSION: No evidence of acute intracranial abnormality. 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 Background mild chronic small vessel ischemic changes within the cerebral white matter. Electronically Signed   By: Jackey Loge D.O.   On: 12/05/2020 13:18   ECHOCARDIOGRAM LIMITED  Result Date: 12/08/2020    ECHOCARDIOGRAM LIMITED REPORT   Patient Name:   Jane Navarro Date of Exam: 12/08/2020 Medical Rec #:  [id number]    Height:       60.0 in Accession #:    [id number]   Weight:       141.5 lb Date of Birth:  [birth year]    BSA:          1.612 m Patient Age:    74 years     BP:           121/86 mmHg Patient Gender: F            HR:           66 bpm. Exam Location:  Inpatient Procedure: Limited Echo, Limited Color Doppler and Cardiac Doppler Indications:    Reassess LV mass  History:        Patient has prior history of Echocardiogram examinations, most                 recent 12/06/2020. Signs/Symptoms:Altered Mental Status; Risk                 Factors:Current Smoker.  Sonographer:    Delcie Roch RDCS Referring Phys: [id number] CALLIE  E GOODRICH IMPRESSIONS  1. 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 It may be slightly smaller in size but is essentially unchanged from previous study. Kristeen Miss MD Electronically signed by Kristeen Miss MD Signature Date/Time: 12/08/2020/4:01:37 PM    Final      PHYSICAL EXAM  Temp:  [97.6 F (36.4 C)-99.5 F (37.5 C)] 98.6 F (37 C) (09/08 1450) Pulse Rate:  [66-76] 66 (09/08 1450) Resp:  [14-18] 18 (09/08 1450) BP: (117-144)/(67-94) 130/67 (09/08 1450) SpO2:  [95 %-98 %] 96 % (09/08 1450)  General - Well nourished, well developed, in no apparent distress.  Ophthalmologic - fundi not visualized due to noncooperation.  Cardiovascular - Regular rhythm and rate.  Mental Status -  Level of arousal and orientation to time, place, and person were intact, however, not orientated to age Language including  expression, naming, comprehension was assessed and found intact, repeating Ok with simple sentences but not complex sentences. Moderate dysarthria Fund of Knowledge was assessed and was intact.  Cranial Nerves II - XII - II - Visual field intact OU. III, IV, VI - Extraocular movements intact. V - Facial sensation intact bilaterally. VII - Facial movement intact bilaterally. VIII - Hearing & vestibular intact bilaterally. X - Palate elevates symmetrically. Moderate dysarthria, poor denture. XI - Chin turning & shoulder shrug intact bilaterally. XII - Tongue protrusion intact.  Motor Strength - The patient's strength was symmetrical in all extremities and pronator drift was absent.  Bulk was normal and fasciculations were absent.   Motor Tone - Muscle tone was assessed at the neck and appendages and was normal.  Reflexes - The patient's reflexes were symmetrical in all extremities and she had no pathological reflexes.  Sensory - Light touch, temperature/pinprick were assessed and were symmetrical.    Coordination - The patient had grossly normal movements in the hands with no  ataxia or dysmetria.  Tremor was absent.  Gait and Station - deferred.   ASSESSMENT/PLAN [name] is a 75 y.o. female with history of kidney stone, and not regularly following with doctors admitted for confusion, slurry speech, left side weakness and low BP. No tPA given due to unclear time onset. Found to have encephalopathy due to severe hypothyroidism and hyperlipidemia.     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 Patient counseled to be compliant with her antithrombotic medications Ongoing aggressive stroke risk factor management Therapy recommendations:  SNF Disposition:  pending  Hypothyroidism TSH = 41 Thyroid US Nonvisualization of thyroid. In the absence of previous thyroidectomy, this would suggest marked parenchymal atrophy. On synthroid Management per primary team  Hypertension Stable Long term BP goal normotensive  Hyperlipidemia Home meds:  none  LDL 327, goal < 70 Now on lipitor  80 Continue statin at discharge  Other Stroke Risk Factors Advanced age Hx of ETOH abuse  Other Active Problems B12 deficiency - on supplement Mild hyponatremia - Na 132  Hospital day # 5   Marvel Plan, MD PhD Stroke Neurology 12/10/2020 4:09 PM    To contact Stroke Continuity provider, please refer to [email or web address]. After hours, contact General Neurology

## 2020-12-11 ENCOUNTER — Encounter (HOSPITAL_COMMUNITY): Payer: Self-pay | Admitting: Internal Medicine

## 2020-12-11 ENCOUNTER — Other Ambulatory Visit: Payer: Self-pay

## 2020-12-11 ENCOUNTER — Inpatient Hospital Stay (HOSPITAL_COMMUNITY): Payer: Medicare HMO | Admitting: Anesthesiology

## 2020-12-11 ENCOUNTER — Encounter (HOSPITAL_COMMUNITY)
Admission: EM | Disposition: A | Discharge status: Skilled Nursing Facility | Payer: Self-pay | Source: Home / Self Care | Attending: Internal Medicine

## 2020-12-11 ENCOUNTER — Inpatient Hospital Stay (HOSPITAL_COMMUNITY): Payer: Medicare HMO

## 2020-12-11 DIAGNOSIS — G9341 Metabolic encephalopathy: Secondary | ICD-10-CM | POA: Diagnosis not present

## 2020-12-11 DIAGNOSIS — I361 Nonrheumatic tricuspid (valve) insufficiency: Secondary | ICD-10-CM

## 2020-12-11 DIAGNOSIS — E079 Disorder of thyroid, unspecified: Secondary | ICD-10-CM | POA: Diagnosis not present

## 2020-12-11 DIAGNOSIS — I6330 Cerebral infarction due to thrombosis of unspecified cerebral artery: Secondary | ICD-10-CM | POA: Diagnosis not present

## 2020-12-11 DIAGNOSIS — I160 Hypertensive urgency: Secondary | ICD-10-CM | POA: Diagnosis not present

## 2020-12-11 HISTORY — PX: TEE WITHOUT CARDIOVERSION: SHX5443

## 2020-12-11 LAB — CBC
HCT: 27 % — ABNORMAL LOW (ref 36.0–46.0)
Hemoglobin: 8.9 g/dL — ABNORMAL LOW (ref 12.0–15.0)
MCH: 32.1 pg (ref 26.0–34.0)
MCHC: 33 g/dL (ref 30.0–36.0)
MCV: 97.5 fL (ref 80.0–100.0)
Platelets: 187 10*3/uL (ref 150–400)
RBC: 2.77 MIL/uL — ABNORMAL LOW (ref 3.87–5.11)
RDW: 14.6 % (ref 11.5–15.5)
WBC: 6.2 10*3/uL (ref 4.0–10.5)
nRBC: 0 % (ref 0.0–0.2)

## 2020-12-11 LAB — CULTURE, BLOOD (ROUTINE X 2)
Culture: NO GROWTH
Culture: NO GROWTH
Special Requests: ADEQUATE
Special Requests: ADEQUATE

## 2020-12-11 LAB — BASIC METABOLIC PANEL
Anion gap: 5 (ref 5–15)
BUN: 9 mg/dL (ref 8–23)
CO2: 22 mmol/L (ref 22–32)
Calcium: 8.9 mg/dL (ref 8.9–10.3)
Chloride: 107 mmol/L (ref 98–111)
Creatinine, Ser: 0.91 mg/dL (ref 0.44–1.00)
GFR, Estimated: >60 mL/min (ref >60)
Glucose, Bld: 89 mg/dL (ref 70–99)
Potassium: 3.6 mmol/L (ref 3.5–5.1)
Sodium: 134 mmol/L — ABNORMAL LOW (ref 135–145)

## 2020-12-11 LAB — HEPARIN LEVEL (UNFRACTIONATED): Heparin Unfractionated: 0.4 IU/mL (ref 0.30–0.70)

## 2020-12-11 SURGERY — ECHOCARDIOGRAM, TRANSESOPHAGEAL
Anesthesia: Monitor Anesthesia Care

## 2020-12-11 MED ORDER — CLOPIDOGREL BISULFATE 75 MG PO TABS
75.0000 mg | ORAL_TABLET | Freq: Every day | ORAL | Status: DC
  Administered 2020-12-11 – 2020-12-12 (×2): 75 mg via ORAL
  Filled 2020-12-11 (×2): qty 1

## 2020-12-11 MED ORDER — THIAMINE HCL 100 MG PO TABS: 100.0000 mg | ORAL_TABLET | Freq: Every day | ORAL | Status: DC

## 2020-12-11 MED ORDER — ASPIRIN EC 81 MG PO TBEC
81.0000 mg | DELAYED_RELEASE_TABLET | Freq: Every day | ORAL | Status: DC
  Administered 2020-12-11 – 2020-12-12 (×2): 81 mg via ORAL
  Filled 2020-12-11 (×2): qty 1

## 2020-12-11 MED ORDER — AMLODIPINE BESYLATE 5 MG PO TABS
5.0000 mg | ORAL_TABLET | Freq: Every day | ORAL | Status: DC
  Administered 2020-12-11 – 2020-12-12 (×2): 5 mg via ORAL
  Filled 2020-12-11: qty 1
  Filled 2020-12-11: qty 2

## 2020-12-11 MED ORDER — LEVOTHYROXINE SODIUM 100 MCG PO TABS
100.0000 ug | ORAL_TABLET | Freq: Every day | ORAL | Status: DC
  Administered 2020-12-12: 100 ug via ORAL
  Filled 2020-12-11: qty 1

## 2020-12-11 MED ORDER — PROPOFOL 500 MG/50ML IV EMUL
INTRAVENOUS | Status: DC | PRN
  Administered 2020-12-11: 120 ug/kg/min via INTRAVENOUS

## 2020-12-11 MED ORDER — PHENYLEPHRINE 40 MCG/ML (10ML) SYRINGE FOR IV PUSH (FOR BLOOD PRESSURE SUPPORT)
PREFILLED_SYRINGE | INTRAVENOUS | Status: DC | PRN
  Administered 2020-12-11: 80 ug via INTRAVENOUS

## 2020-12-11 NOTE — CV Procedure (Signed)
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

## 2020-12-11 NOTE — Progress Notes (Signed)
ANTICOAGULATION CONSULT NOTE  Pharmacy Consult for heparin Indication:  LV thrombus  (Initially suspected stroke)  Labs: Recent Labs    12/09/20 0501 12/09/20 0743 12/09/20 1539 12/10/20 0052 12/10/20 0803 12/11/20 0205  HGB 9.9*  --   --   --  9.8* 8.9*  HCT 28.7*  --   --   --  30.0* 27.0*  PLT 147*  --   --   --  171 187  HEPARINUNFRC  --    < > 0.36 0.43  --  0.40  CREATININE 1.05*  --   --   --  1.02* 0.91   < > = values in this interval not displayed.     Assessment: 41y YOF presented as a code stroke and MRI showed small infarct.  Pharmacy consulted for heparin dosing for possible intracardiac mass on ECHO.  TEE showed "likely ruptured secondary or accessory chord that is mobile and calcified."  Heparin level therapeutic and stable; no bleeding noted.   Goal of Therapy:  Heparin level 0.3-0.5 units/ml   Plan:  Continue heparin infusion at 850 units/hr  Daily heparin level and CBC F/u [facility] plans  Loriel Diehl D. Laney Potash, PharmD, BCPS, BCCCP 12/11/2020, 10:48 AM

## 2020-12-11 NOTE — Progress Notes (Addendum)
STROKE TEAM PROGRESS NOTE   SUBJECTIVE (INTERVAL HISTORY) Her son is at the bedside.  Patient is lying in bed, awake alert pleasant and raised up her thumb when I told her that her TEE no clot or tumor. Cardiology recommend ASA and stopped heparin IV. We will recommend DAPT for 3 weeks.    OBJECTIVE Temp:  [97.5 F (36.4 C)-98.6 F (37 C)] 98.2 F (36.8 C) (09/09 1217) Pulse Rate:  [66-72] 69 (09/09 1217) Cardiac Rhythm: Normal sinus rhythm (09/08 1911) Resp:  [12-18] 18 (09/09 1217) BP: (120-192)/(67-93) 165/89 (09/09 1217) SpO2:  [92 %-100 %] 99 % (09/09 1217)  Recent Labs  Lab 12/05/20 1248 12/09/20 0451  GLUCAP 110* 94   Recent Labs  Lab 12/07/20 0222 12/08/20 0108 12/09/20 0345 12/09/20 0501 12/10/20 0803 12/11/20 0205  NA 128* 128* 111* 132* 135 134*  K 3.7 3.1* 2.4* 3.2* 3.7 3.6  CL 96* 99 87* 104 108 107  CO2 23 20* 18* 21* 20* 22  GLUCOSE 83 73 828* 85 79 89  BUN CREATININE 1.15* 1.13* 0.91 1.05* 1.02* 0.91  CALCIUM 9.5 8.9 6.8* 8.6* 9.0 8.9  MG 2.2  --   --   --   --   --   PHOS 3.0  --   --   --   --   --    Recent Labs  Lab 12/05/20 1247 12/06/20 0138 12/07/20 0222 12/08/20 0108  AST 68* 90* 109* 86*  ALT ALKPHOS 66 72 76 68  BILITOT 0.4 0.5 0.6 0.6  PROT 7.2 7.5 7.5 6.3*  ALBUMIN 4.0 4.1 3.9 3.3*   Recent Labs  Lab 12/05/20 1247 12/05/20 1252 12/06/20 0138 12/07/20 0222 12/08/20 0108 12/09/20 0345 12/09/20 0501 12/10/20 0803 12/11/20 0205  WBC 5.8  --  8.1   < > 6.1 4.4 5.5 5.7 6.2  NEUTROABS 3.9  --  7.1  --   --   --   --   --   --   HGB 10.6*   < > 11.8*   < > 10.6* 8.0* 9.9* 9.8* 8.9*  HCT 32.6*   < > 34.1*   < > 31.1* 24.0* 28.7* 30.0* 27.0*  MCV 97.3  --  93.2   < > 94.0 94.5 94.1 97.4 97.5  PLT 201  --  174   < > 165 141* 147* 171 187   < > = values in this interval not displayed.   No results for input(s): CKTOTAL, CKMB, CKMBINDEX, TROPONINI in the last 168 hours. No results for input(s):  LABPROT, INR in the last 72 hours. No results for input(s): COLORURINE, LABSPEC, PHURINE, GLUCOSEU, HGBUR, BILIRUBINUR, KETONESUR, PROTEINUR, UROBILINOGEN, NITRITE, LEUKOCYTESUR in the last 72 hours.  Invalid input(s): APPERANCEUR     Component Value Date/Time   CHOL 429 (H) 12/06/2020 0138   TRIG 258 (H) 12/06/2020 0138   HDL 50 12/06/2020 0138   CHOLHDL 8.6 12/06/2020 0138   VLDL 52 (H) 12/06/2020 0138   LDLCALC 327 (H) 12/06/2020 0138   Lab Results  Component Value Date   HGBA1C 5.5 12/05/2020      Component Value Date/Time   LABOPIA NONE DETECTED 12/05/2020 1600   COCAINSCRNUR NONE DETECTED 12/05/2020 1600   LABBENZ NONE DETECTED 12/05/2020 1600   AMPHETMU NONE DETECTED 12/05/2020 1600   THCU NONE DETECTED 12/05/2020 1600   LABBARB NONE DETECTED 12/05/2020 1600    Recent Labs  Lab 12/05/20 1957  ETH <10    I have personally reviewed the radiological images below and agree with the radiology interpretations.  CT ANGIO HEAD NECK W WO CM  Result Date: 12/09/2020 CLINICAL DATA:  Stroke, follow-up EXAM: CT ANGIOGRAPHY HEAD AND NECK TECHNIQUE: Multidetector CT imaging of the head and neck was performed using the standard protocol during bolus administration of intravenous contrast. Multiplanar CT image reconstructions and MIPs were obtained to evaluate the vascular anatomy. Carotid stenosis measurements (when applicable) are obtained utilizing NASCET criteria, using the distal internal carotid diameter as the denominator. CONTRAST:  OMNIPAQUE IOHEXOL 350 MG/ML SOLN COMPARISON:  None. FINDINGS: CT HEAD Brain: There is no acute intracranial hemorrhage or mass effect. Small infarct on MRI is not visible. Patchy and confluent areas of low-density in the supratentorial white matter likely reflect chronic microvascular ischemic changes. 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 Vascular: No hyperdense vessel. Skull: No new finding. Sinuses/Orbits: No acute finding. Other:  None. Review of the MIP images confirms the above findings CTA NECK Aortic arch: There is mixed plaque along the arch. Great vessel origins are patent. There is noncalcified plaque along the proximal subclavian artery origins. Right carotid system: Patent. Mild atherosclerotic wall thickening along the common carotid. Mixed plaque along the proximal internal carotid causing less than 50% stenosis. Left carotid system: Patent. Mixed plaque along the common carotid with less than 50% stenosis. Mixed plaque along the proximal internal carotid less than 50% stenosis. Vertebral arteries: Patent. Left vertebral artery arises directly from the arch. Plaque causes at least mild stenosis at the left origin. Right vertebral artery is dominant. Skeleton: Degenerative changes of the cervical spine. Other neck: Unremarkable. Upper chest: Trace pleural effusions. Review of the MIP images confirms the above findings CTA HEAD Anterior circulation: Intracranial internal carotid arteries are patent with minimal calcified plaque. Anterior cerebral arteries are patent. Left A1 ACA is dominant. Middle cerebral arteries are patent. There is mild stenosis of the left M1 MCA. Posterior circulation: Intracranial vertebral arteries are patent. Basilar artery is patent. Major cerebellar artery origins are patent. A right posterior communicating artery is present. Posterior cerebral arteries are patent. Mild bilateral P2 PCA stenoses. Venous sinuses: Patent as allowed by contrast bolus timing. Review of the MIP images confirms the above findings IMPRESSION: No acute intracranial abnormality. Small infarct on MRI is not visible. Plaque at the common and proximal internal carotid arteries with less than 50% stenosis. Mild intracranial atherosclerosis. Electronically Signed   By: Guadlupe Spanish M.D.   On: 12/09/2020 18:24   DG Abd 1 View  Result Date: 12/09/2020 CLINICAL DATA:  Abdominal distension. EXAM: ABDOMEN - 1 VIEW COMPARISON:  CT  abdomen and pelvis 12/16/2014. FINDINGS: There is gaseous distension of large and small bowel diffusely to the level of the sigmoid colon. Colonic loops in the mid abdomen are dilated measuring up to 8.6 cm in diameter. Small bowel loops are prominent, but not dilated by size criteria. There are rounded calcifications in the pelvis favored as phleboliths when compared to prior CT. The visualized lung bases are clear. No acute fractures are identified. IMPRESSION: 1. Diffuse gaseous distension of the colon with dilatation of the transverse colon/right colon. This is of uncertain etiology. Findings may related to colonic ileus, partial colonic obstruction, or cecal volvulus in the appropriate clinical setting. Further evaluation with CT should be considered. Electronically Signed   By: Darliss Cheney M.D.   On: 12/09/2020 16:04   MR BRAIN WO CONTRAST  Result Date: 12/08/2020  CLINICAL DATA:  Mental status change, unknown cause. EXAM: MRI HEAD WITHOUT CONTRAST TECHNIQUE: Multiplanar, multiecho pulse sequences of the brain and surrounding structures were obtained without intravenous contrast. COMPARISON:  Head CT 12/05/2020 FINDINGS: The study is mildly motion degraded. Brain: There is a 1 cm acute infarct involving the mid to posterior aspect of the left cingulate gyrus. Patchy T2 hyperintensities in the cerebral white matter bilaterally are nonspecific but compatible with moderate chronic small vessel ischemic disease. There is a chronic lacunar infarct involving the body of the right caudate nucleus. A chronic microhemorrhage is noted in the region of the left external capsule. There is mild-to-moderate cerebral atrophy. No mass, midline shift, or extra-axial fluid collection is identified. Vascular: Major intracranial vascular flow voids are preserved. Skull and upper cervical spine: Unremarkable bone marrow signal. Sinuses/Orbits: Unremarkable orbits. Minimal bilateral ethmoid air cell mucosal thickening. Trace  left mastoid fluid. Other: None. IMPRESSION: 1. Small acute infarct in the left cingulate gyrus. 2. Moderate chronic small vessel ischemic disease. Electronically Signed   By: Sebastian Ache M.D.   On: 12/08/2020 18:40   CT ABDOMEN PELVIS W CONTRAST  Result Date: 12/09/2020 CLINICAL DATA:  Abdominal distension Recent stroke. EXAM: CT ABDOMEN AND PELVIS WITH CONTRAST TECHNIQUE: Multidetector CT imaging of the abdomen and pelvis was performed using the standard protocol following bolus administration of intravenous contrast. CONTRAST:  OMNIPAQUE IOHEXOL 350 MG/ML SOLN COMPARISON:  CT 12/16/2014 FINDINGS: Lower chest: Trace bilateral pleural effusions. Associated basilar atelectasis. Atherosclerosis and tortuosity of the descending thoracic aorta. Small pericardial effusion adjacent to the right heart border. Hepatobiliary: Scattered small hepatic hypodensities that are not significantly changed from prior exam. Largest measures 16 mm in the left lobe and is consistent with simple cyst. Majority of these lesions are too small to accurately characterize. Unremarkable gallbladder. No pericholecystic inflammation or biliary dilatation. Pancreas: Unremarkable. No pancreatic ductal dilatation or surrounding inflammatory changes. Spleen: Normal in size without focal abnormality. Adrenals/Urinary Tract: No adrenal nodule. Mild symmetric perinephric edema which was also present on prior exam. No hydronephrosis. Tiny cortical hypodensity in the posterior left kidney is too small to characterize but likely cyst. No suspicious renal lesion. No visualized renal stone. Decompressed ureters. Partially distended urinary bladder. No definite bladder wall thickening. Stomach/Bowel: Detailed bowel assessment is limited in the absence of enteric contrast. There is a small hiatal hernia. The stomach is decompressed, equivocal gastric wall thickening versus nondistention. No small bowel obstruction or inflammation. Slight increased  air throughout nondilated small bowel. Normal appendix courses into the right pericolic gutter under the liver tip. There is subtle wall thickening of the cecum and ascending colon, series 9, images 24 and 40. Mild gaseous distension of transverse colon. Anti mesenteric border of transverse colon extends into an umbilical hernia, no associated wall thickening or inflammation. Moderate stool in the left colon. Sigmoid colon is redundant and courses into the central abdomen. Sigmoid colonic diverticulosis. No diverticulitis. Mild rectal wall thickening. Small volume of stool in the rectum. Vascular/Lymphatic: Moderate aortic atherosclerosis. No aortic aneurysm. Patent portal vein. No enlarged lymph nodes in the abdomen or pelvis. Reproductive: Status post hysterectomy. No adnexal masses. Other: Umbilical hernia contains fat and anti mesenteric border of the transverse colon. Trace fluid in the aneurysm sac. No ascites or free air. Musculoskeletal: Degenerative change in the lumbar spine is most prominent at L5-S1. There are no acute or suspicious osseous abnormalities. IMPRESSION: 1. Subtle wall thickening of the cecum and ascending colon, suspicious for colitis. 2. Colonic redundancy  with mild distention with air and stool, query constipation and colonic dysmotility. 3. Umbilical hernia contains fat and anti mesenteric border of the transverse colon, no associated wall thickening or inflammation. 4. Sigmoid colonic diverticulosis without diverticulitis. 5. Trace bilateral pleural effusions. Small pericardial effusion adjacent to the right heart border. 6. Scattered cysts in the liver as well as low-density lesions too small to characterize. Aortic Atherosclerosis (ICD10-I70.0). Electronically Signed   By: Narda Rutherford M.D.   On: 12/09/2020 19:40   MR CARDIAC MORPHOLOGY W WO CONTRAST  Result Date: 12/09/2020 CLINICAL DATA:  Clinical question of papillary mass EXAM: CARDIAC MRI TECHNIQUE: The patient was scanned  on a 1.5 Tesla GE magnet. A dedicated cardiac coil was used. Functional imaging was done using Fiesta sequences. 2,3, and 4 chamber views were done to assess for RWMA's. Modified Simpson's rule using a short axis stack was used to calculate an ejection fraction on a dedicated work [employer]. The patient received 10 cc of Gadavist. After 10 minutes inversion recovery sequences were used to assess for infiltration and scar tissue. CONTRAST:  10 cc  of Gadavist FINDINGS: 1. Normal left ventricular size, with LVEDD 37 mm, and LVEDVi 54 mL/m2. Normal left ventricular thickness, with intraventricular septal thickness of 13 mm, posterior wall thickness of 9 mm, and septal to posterior ratio < 1.5; myocardial mass index 56 g/m2. Normal left ventricular systolic function (LVEF =60%). There are no regional wall motion abnormalities. Left ventricular parametric mapping notable for elevated native T1 signal (basal inferior and inferolateral 1260 ms) and grossly elevated T2 signal (60 ms). There is no late gadolinium enhancement in the left ventricular myocardium meeting 5 standard deviation from normal criteria. Anterolateral papillary muscle appears apically displaced and appears bifid in the short axis. No evidence of significant hypertrophy. The is no systolic anterior motion of the mitral valve. Papillary muscle is isointense on gradient echo not suggestive of calcification. Papillary muscle is isointense to myocardium on LGE sequences, not suggestive of thrombus. Papillary muscle is isointense to myocardium on T2 signal, not suggestive of myxoma. Papillary muscle perfuses with the rest of the myocardium. 2. Normal right ventricular size with RVEDVI 60 mL/m2. Normal right ventricular thickness. Normal right ventricular systolic function (RVEF =67 %). There are no regional wall motion abnormalities or aneurysms. 3.  Normal left and right atrial size. 4. Normal size of the aortic root, ascending aorta and  pulmonary artery. 5.  No significant valvular abnormalities. 6.  Normal pericardium.  No pericardial effusion. 7. Grossly, no extracardiac findings. Recommended dedicated study if concerned for non-cardiac pathology. 8. Significant breathhold artifact through entire study. This decreases the accuracy of volumetric assessment, LGE assessment, and that of parametric mapping. IMPRESSION: Significant artifact. Anterolateral papillary muscle appears apically displaced and appears bifid in the short axis. Rhabdomyoma and very small thrombus below the resolution of CMR cannot be excluded. Riley Lam MD Electronically Signed   By: Riley Lam M.D.   On: 12/09/2020 20:15   ECHOCARDIOGRAM COMPLETE  Result Date: 12/06/2020    ECHOCARDIOGRAM REPORT   Patient Name:   HEELA HEISHMAN Date of Exam: 12/06/2020 Medical Rec #:  [id number]    Height:       60.0 in Accession #:    [id number]   Weight:       141.5 lb Date of Birth:  11-19-1945    BSA:          1.612 m Patient Age:    21 years  BP:           150/92 mmHg Patient Gender: F            HR:           72 bpm. Exam Location:  Inpatient Procedure: 2D Echo, Cardiac Doppler and Color Doppler Indications:    Stroke I63.9  History:        Patient has no prior history of Echocardiogram examinations.                 Signs/Symptoms:Altered Mental Status; Risk Factors:Former                 Smoker. Hypertensive urgency. Hypothyroidism. Passed out in                 parking lot after shopping.  Sonographer:    Roosvelt Maserachel Lane RDCS Referring Phys: [id number] Beather ArbourKAREN J [facility] Center[facility]IR[facility] IMPRESSIONS  1. 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 It measures approximately 13 mm in maximum length and 5 mm in width, with a relatively narrow attachment base. Left ventricular ejection fraction, by estimation, is 60 to 65%. The left ventricle has normal function. The  left ventricle has no regional wall motion abnormalities. There is mild concentric left ventricular hypertrophy. Left ventricular diastolic parameters are consistent with Grade I diastolic dysfunction (impaired relaxation). Elevated left atrial pressure.  2. Right ventricular systolic function is normal. The right ventricular size is normal.  3. The mitral valve is normal in structure. No evidence of mitral valve regurgitation. No evidence of mitral stenosis.  4. The aortic valve is normal in structure. Aortic valve regurgitation is not visualized. No aortic stenosis is present.  5. The inferior vena cava is normal in size with greater than 50% respiratory variability, suggesting right atrial pressure of 3 mmHg. Conclusion(s)/Recommendation(s): Discussed with primary team. The left ventricular mass is highly atypical for either thrombus or vegetation and the location is unusual for myxoma or papillary fibroelastoma. Its mobility suggests significant embolic potential. While TEE would offer better resolution, it may not add diagnostic value. Consider cardiac MRI. 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 It measures approximately 13 mm in maximum length and 5 mm in width, with a relatively narrow attachment base. Left ventricular ejection fraction, by estimation, is 60 to 65%. The left ventricle has normal function. The left ventricle has no regional wall motion abnormalities. The left ventricular internal cavity size was normal in size. There is mild concentric left ventricular hypertrophy. Left ventricular diastolic parameters are consistent with Grade I diastolic dysfunction (impaired relaxation). Elevated left atrial pressure. Right Ventricle: The right ventricular size is normal. No increase in right ventricular wall thickness. Right ventricular systolic  function is normal. Left Atrium: Left atrial size was normal in size. Right Atrium: Right atrial size was normal in size. Pericardium: There is no evidence of pericardial effusion. Mitral Valve: The mitral valve is normal in structure. No evidence of mitral valve regurgitation. No evidence of mitral valve stenosis. Tricuspid Valve: The tricuspid valve is normal in structure. Tricuspid valve regurgitation is not demonstrated. No evidence of tricuspid stenosis. Aortic Valve: The aortic valve is normal in structure. Aortic valve regurgitation is not visualized. No aortic stenosis is present. Aortic valve mean gradient measures 5.0 mmHg.  Aortic valve peak gradient measures 8.1 mmHg. Aortic valve area, by VTI measures 1.71 cm. Pulmonic Valve: The pulmonic valve was normal in structure. Pulmonic valve regurgitation is not visualized. No evidence of pulmonic stenosis. Aorta: The aortic root is normal in size and structure. Venous: The inferior vena cava is normal in size with greater than 50% respiratory variability, suggesting right atrial pressure of 3 mmHg. IAS/Shunts: No atrial level shunt detected by color flow Doppler.  LEFT VENTRICLE PLAX 2D LVIDd:         3.60 cm LVIDs:         2.60 cm  Diastology LV PW:         1.20 cm  LV e' medial:    3.97 cm/s LV IVS:        1.50 cm  LV E/e' medial:  16.8 LVOT diam:     1.70 cm  LV e' lateral:   5.35 cm/s LV SV:         49       LV E/e' lateral: 12.5 LV SV Index:   30 LVOT Area:     2.27 cm  RIGHT VENTRICLE RV Basal diam:  3.00 cm LEFT ATRIUM             Index       RIGHT ATRIUM           Index LA diam:        2.10 cm 1.30 cm/m  RA Area:     10.60 cm LA Vol (A2C):   36.7 ml 22.77 ml/m RA Volume:   21.10 ml  13.09 ml/m LA Vol (A4C):   21.6 ml 13.40 ml/m LA Biplane Vol: 30.1 ml 18.68 ml/m  AORTIC VALVE AV Area (Vmax):    1.65 cm AV Area (Vmean):   1.48 cm AV Area (VTI):     1.71 cm AV Vmax:           142.00 cm/s AV Vmean:          105.000 cm/s AV VTI:            0.285  m AV Peak Grad:      8.1 mmHg AV Mean Grad:      5.0 mmHg LVOT Vmax:         103.00 cm/s LVOT Vmean:        68.300 cm/s LVOT VTI:          0.215 m LVOT/AV VTI ratio: 0.75  AORTA Ao Root diam: 2.40 cm MITRAL VALVE MV Area (PHT): 2.29 cm     SHUNTS MV Decel Time: 332 msec     Systemic VTI:  0.22 m MV E velocity: 66.80 cm/s   Systemic Diam: 1.70 cm MV A velocity: 109.00 cm/s MV E/A ratio:  0.61 Mihai Croitoru MD Electronically signed by Thurmon Fair MD Signature Date/Time: 12/06/2020/2:42:42 PM    Final    ECHO TEE  Result Date: 12/11/2020    TRANSESOPHOGEAL ECHO REPORT   Patient Name:   LIEL RUDDEN Date of Exam: 12/11/2020 Medical Rec #:  [id number]    Height:       60.0 in Accession #:    [id number]   Weight:       141.5 lb Date of Birth:  1945/06/21    BSA:          1.612 m Patient Age:    74 years     BP:           130/80 mmHg  Patient Gender: F            HR:           68 bpm. Exam Location:  Inpatient Procedure: Transesophageal Echo Indications:    TIA  History:        Patient has prior history of Echocardiogram examinations.  Sonographer:    Roosvelt Maser RDCS Referring Phys: [id number] [facility] A CHANDRASEKHAR PROCEDURE: After discussion of the risks and benefits of a TEE, an informed consent was obtained. The transesophogeal probe was passed without difficulty through the esophogus of the patient. Sedation performed by different physician. The patient developed no complications during the procedure. IMPRESSIONS  1. Extensive 3 D imaging Likely redundant calcified tertiarry mobile chord seen in mid/apical LV cavity represents "mass" noted on previous TTE/MRI.  2. Left ventricular ejection fraction, by estimation, is 60 to 65%. The left ventricle has normal function. There is mild left ventricular hypertrophy.  3. Right ventricular systolic function is normal. The right ventricular size is normal.  4. Left atrial size was mildly dilated. No left atrial/left atrial appendage thrombus was detected.  5. The mitral valve  is normal in structure. No evidence of mitral valve regurgitation.  6. The aortic valve is tricuspid. Aortic valve regurgitation is not visualized. No aortic stenosis is present. FINDINGS  Left Ventricle: Left ventricular ejection fraction, by estimation, is 60 to 65%. The left ventricle has normal function. The left ventricular internal cavity size was small. There is mild left ventricular hypertrophy. Right Ventricle: The right ventricular size is normal. No increase in right ventricular wall thickness. Right ventricular systolic function is normal. Left Atrium: Left atrial size was mildly dilated. No left atrial/left atrial appendage thrombus was detected. Right Atrium: Right atrial size was normal in size. Pericardium: There is no evidence of pericardial effusion. Mitral Valve: The mitral valve is normal in structure. No evidence of mitral valve regurgitation. Tricuspid Valve: The tricuspid valve is normal in structure. Tricuspid valve regurgitation is mild. Aortic Valve: The aortic valve is tricuspid. Aortic valve regurgitation is not visualized. No aortic stenosis is present. Pulmonic Valve: The pulmonic valve was normal in structure. Pulmonic valve regurgitation is trivial. Aorta: The aortic root is normal in size and structure. IAS/Shunts: The interatrial septum appears to be lipomatous. No atrial level shunt detected by color flow Doppler. 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 Charlton Haws MD Electronically signed by Charlton Haws MD Signature Date/Time: 12/11/2020/8:26:54 AM    Final    US THYROID  Result Date: 12/06/2020 CLINICAL DATA:  Goiter.  Elevated TSH EXAM: THYROID ULTRASOUND TECHNIQUE: Ultrasound examination of the thyroid gland and adjacent soft tissues was performed. COMPARISON:  None. FINDINGS: Parenchymal Echotexture: None identified Isthmus: Not visualized Right  lobe: Not visualized Left lobe: Not visualized _________________________________________________________ Estimated total number of nodules >/= 1 cm: 0 Number of spongiform nodules >/=  2 cm not described below (TR1): 0 Number of mixed cystic and solid nodules >/= 1.5 cm not described below (TR2): 0 _________________________________________________________ No normal thyroid parenchyma was localized. No regional cervical adenopathy. IMPRESSION: Nonvisualization of thyroid. In the absence of previous thyroidectomy, this would suggest marked parenchymal atrophy. The above is in keeping with the ACR TI-RADS recommendations - J Am Coll Radiol 2017;14:587-595. Electronically Signed   By: Corlis Leak M.D.   On: 12/06/2020 15:53   CT HEAD CODE STROKE WO CONTRAST  Result Date: 12/05/2020 CLINICAL DATA:  Code stroke. Neuro deficit, acute, stroke suspected. EXAM: CT HEAD WITHOUT CONTRAST TECHNIQUE: Contiguous axial images were obtained from the base of the skull through the vertex without intravenous contrast. COMPARISON:  Report from head CT 11/21/1998 (images currently unavailable). FINDINGS: Brain: Cerebral volume is normal for age. 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 Background mild multifocal T2/FLAIR hyperintensity within the cerebral white matter, nonspecific but compatible with chronic small vessel ischemic disease. There is no acute intracranial hemorrhage. No demarcated cortical infarct. No extra-axial fluid collection. No evidence of an intracranial mass. No midline shift. Vascular: No hyperdense vessel.  Atherosclerotic calcifications. Skull: Normal. Negative for fracture or focal lesion. Sinuses/Orbits: Visualized orbits show no acute finding. No significant paranasal sinus disease. ASPECTS [facility] Stroke Program Early CT Score) - Ganglionic level infarction (caudate, lentiform nuclei, internal capsule, insula, M1-M3 cortex): 7 - Supraganglionic infarction  (M4-M6 cortex): 3 Total score (0-10 with 10 being normal): 10 (when discounting chronic infarcts). These results were communicated to Dr. Selina Cooley At 1:18 pmon 9/3/2022by text page via the [facility] messaging system. IMPRESSION: No evidence of acute intracranial abnormality. 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 Background mild chronic small vessel ischemic changes within the cerebral white matter. Electronically Signed   By: Jackey Loge D.O.   On: 12/05/2020 13:18   ECHOCARDIOGRAM LIMITED  Result Date: 12/08/2020    ECHOCARDIOGRAM LIMITED REPORT   Patient Name:   GIAH FICKETT Date of Exam: 12/08/2020 Medical Rec #:  [id number]    Height:       60.0 in Accession #:    [id number]   Weight:       141.5 lb Date of Birth:  July 24, 1945    BSA:          1.612 m Patient Age:    74 years     BP:           121/86 mmHg Patient Gender: F            HR:           66 bpm. Exam Location:  Inpatient Procedure: Limited Echo, Limited Color Doppler and Cardiac Doppler Indications:    Reassess LV mass  History:        Patient has prior history of Echocardiogram examinations, most                 recent 12/06/2020. Signs/Symptoms:Altered Mental Status; Risk                 Factors:Current Smoker.  Sonographer:    Delcie Roch RDCS Referring Phys: [id number] CALLIE E [facility] IMPRESSIONS  1. 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 It may be slightly smaller in size but is essentially unchanged from previous study. Kristeen Miss MD Electronically signed by Kristeen Miss MD Signature Date/Time: 12/08/2020/4:01:37 PM    Final      PHYSICAL EXAM  Temp:  [97.5 F (36.4 C)-98.6 F (37 C)] 98.2 F (36.8 C) (09/09 1217) Pulse Rate:  [66-72] 69 (09/09 1217) Resp:  [12-18] 18 (09/09 1217) BP: (120-192)/(67-93) 165/89 (09/09 1217) SpO2:  [92 %-100 %] 99 % (09/09 1217)  General - Well nourished, well developed, in no apparent distress.  Ophthalmologic - fundi not visualized due to  noncooperation.  Cardiovascular - Regular rhythm and rate.  Mental Status -  Level of arousal and orientation to time, place, and person were intact, however, not orientated to age Language including expression, naming, comprehension was assessed and  found intact, repeating Ok with simple sentences but not complex sentences. Moderate dysarthria Fund of Knowledge was assessed and was intact.  Cranial Nerves II - XII - II - Visual field intact OU. III, IV, VI - Extraocular movements intact. V - Facial sensation intact bilaterally. VII - Facial movement intact bilaterally. VIII - Hearing & vestibular intact bilaterally. X - Palate elevates symmetrically. Moderate dysarthria, poor denture. XI - Chin turning & shoulder shrug intact bilaterally. XII - Tongue protrusion intact.  Motor Strength - The patient's strength was symmetrical in all extremities and pronator drift was absent.  Bulk was normal and fasciculations were absent.   Motor Tone - Muscle tone was assessed at the neck and appendages and was normal.  Reflexes - The patient's reflexes were symmetrical in all extremities and she had no pathological reflexes.  Sensory - Light touch, temperature/pinprick were assessed and were symmetrical.    Coordination - The patient had grossly normal movements in the hands with no ataxia or dysmetria.  Tremor was absent.  Gait and Station - deferred.   ASSESSMENT/PLAN Ms. Keiley Levey is a 75 y.o. female with history of kidney stone, and not regularly following with doctors admitted for confusion, slurry speech, left side weakness and low BP. No tPA given due to unclear time onset. Found to have encephalopathy due to severe hypothyroidism and hyperlipidemia.     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 Will recommend ASA 81 and plavix 75 DAPT for 3 weeks and then ASA 81 alone. Patient counseled to be compliant with her antithrombotic medications Ongoing aggressive stroke risk factor management Therapy recommendations:  SNF Disposition:  pending  Hypothyroidism TSH = 41 Thyroid US Nonvisualization of thyroid. In the absence of previous thyroidectomy, this would suggest marked parenchymal atrophy. On synthroid Management per primary team  Hypertension Stable Long term BP goal normotensive  Hyperlipidemia Home meds:  none  LDL 327, goal < 70 Now on lipitor 80 Continue statin at discharge  Other Stroke Risk Factors Advanced age Hx of ETOH abuse  Other Active Problems B12 deficiency - on supplement Mild hyponatremia - Na 132  Hospital day # 6  I had long discussion with son at bedside, updated pt current condition, treatment plan and potential prognosis, and answered all the questions. He expressed understanding and appreciation. I also  discussed with Dr. Sunnie Nielsen   Neurology will sign  off. Please call with questions. Pt will follow up with stroke clinic NP at [facility] in about 4 weeks. Thanks for the consult.   Marvel Plan, MD PhD Stroke Neurology 12/11/2020 1:07 PM    To contact Stroke Continuity provider, please refer to [email or web address]. After hours, contact General Neurology

## 2020-12-11 NOTE — Progress Notes (Signed)
Physical Therapy Treatment Patient Details Name: Jane Navarro MRN: [id number] DOB: 1946/03/28 Today's Date: 12/11/2020    History of Present Illness [name] is a 75 yo female brought to ED by EMS 9/3 from a [employer] parking lot--found patient with slurred speech, left sided weakness, and confusion.CTH showed no evidence of acute intracranial abnormality, but chronic appearing lacunar infarct within the right caudate nucleus and chronic small vessel ischemic changes. 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 On 9/4 pt with agitation and given Ativan x 3. PMH - kidney stone and rest unknown because she has not been to a provider in years.    PT Comments    Pt received in supine, agreeable to therapy session at bed-level with encouragement, pt more lethargic today post-TEE and likely sedation so with limited tolerance for mobility. Pt performed roll to L multiple reps with minA for repositioning and instructed on pressure relief strategy. Pt performed supine UE/LE therapeutic exercises with good tolerance as detailed below, needs active assist for SLR and hip abduction due to L hip pain but otherwise AROM, HEP given to reinforce. Pt continues to benefit from PT services to progress toward functional mobility goals.    Follow Up Recommendations  SNF     Equipment Recommendations  Rolling walker with 5" wheels;3in1 (PT)    Recommendations for Other Services       Precautions / Restrictions Precautions Precautions: Fall Precaution Comments: Short R step length, lending to imbalance and RW too far forward Required Braces or Orthoses: Other Brace Other Brace: endorses baseline vertigo, pt wears neoprene type sleeve on B knees due to arthritic pain Restrictions Weight Bearing Restrictions: No Other Position/Activity Restrictions: be aware of L knee pain with bearing weight    Mobility  Bed Mobility Overal bed mobility:  Needs Assistance Bed Mobility: Rolling Rolling: Min assist         General bed mobility comments: cues for use of bed rail/pushing with opposite LE; x2 reps for repositioning/pressure relief    Transfers                 General transfer comment: pt refusing OOB/EOB due to lethargy/fatigue after sedation  Ambulation/Gait                 Stairs             Wheelchair Mobility    Modified Rankin (Stroke Patients Only)       Balance                                            Cognition Arousal/Alertness: Lethargic;Suspect due to medications Behavior During Therapy: [facility] for tasks assessed/performed Overall Cognitive Status: Impaired/Different from baseline Area of Impairment: Attention                   Current Attention Level: Sustained Memory: Decreased short-term memory Following Commands: Follows one step commands with increased time Safety/Judgement: Decreased awareness of safety Awareness: Intellectual Problem Solving: Slow processing;Requires verbal cues;Requires tactile cues General Comments: Decreased processing, pt very drowsy today due to recent return from TEE and just got back to bed from squat pivot to [facility]. Sons report visual impairments are d/t not having appropriate prescription glasses      Exercises Other Exercises Other Exercises: supine BLE AROM: ankle pumps, heel slides, SAQ  x10 reps ea Other Exercises: supine BLE AAROM: hip abduction, SLR x10 reps ea Other Exercises: supine BUE AROM: wrist flex/ext, chest press (~5 reps, pt deferring more due to fatigue)    General Comments General comments (skin integrity, edema, etc.): BP 159/93 (112); HR 70 bpm; SpO2 95% on RA      Pertinent Vitals/Pain Pain Assessment: Faces Faces Pain Scale: Hurts little more Pain Location: L knee with flexion/ROM during therex Pain Descriptors / Indicators: Discomfort;Grimacing Pain Intervention(s): Limited activity within  patient's tolerance;Monitored during session;Premedicated before session;Repositioned    Home Living                      Prior Function            PT Goals (current goals can now be found in the care plan section) Acute Rehab PT Goals Patient Stated Goal: to rest (just got back from TEE) PT Goal Formulation: With patient Time For Goal Achievement: 12/20/20 Potential to Achieve Goals: Good Progress towards PT goals: Progressing toward goals (slow progress, lethargy today limiting)    Frequency    Min 3X/week      PT Plan Current plan remains appropriate    Co-evaluation              AM-PAC PT "6 Clicks" Mobility   Outcome Measure  Help needed turning from your back to your side while in a flat bed without using bedrails?: A Little Help needed moving from lying on your back to sitting on the side of a flat bed without using bedrails?: A Lot Help needed moving to and from a bed to a chair (including a wheelchair)?: A Lot Help needed standing up from a chair using your arms (e.g., wheelchair or bedside chair)?: A Lot Help needed to walk in hospital room?: Total Help needed climbing 3-5 steps with a railing? : Total 6 Click Score: 11    End of Session   Activity Tolerance: Patient limited by lethargy (post-sedation lethargy) Patient left: in bed;with call bell/phone within reach;with bed alarm set;with family/visitor present (semi-sidelying to L side) Nurse Communication: Mobility status PT Visit Diagnosis: Unsteadiness on feet (R26.81);Other abnormalities of gait and mobility (R26.89) Pain - Right/Left: Left Pain - part of body: Knee     Time: 5027-7412 PT Time Calculation (min) (ACUTE ONLY): 19 min  Charges:  $Therapeutic Exercise: 8-22 mins                     Jermone Geister P., PTA Acute Rehabilitation Services Pager: [phone] Office: [phone]    Angus Palms 12/11/2020, 5:05 PM

## 2020-12-11 NOTE — Progress Notes (Signed)
Progress Note  Patient Name: Jane Navarro Date of Encounter: 12/11/2020  [facility] Cardiologist: Parke Poisson, MD  Subjective   No acute overnight events.   No issues with TEE.  Feels a bit tirred after but otherwise well.  Inpatient Medications    Scheduled Meds:  atorvastatin  80 mg Oral Daily   levothyroxine  50 mcg Intravenous Daily   polyethylene glycol  17 g Oral Daily   sodium chloride flush  3 mL Intravenous Q12H   [START ON 12/13/2020] thiamine injection  100 mg Intravenous Daily   vitamin B-12  100 mcg Oral Daily   Continuous Infusions:  thiamine injection 250 mg (12/11/20 1108)   PRN Meds: haloperidol lactate, hydrALAZINE, lip balm, LORazepam, ondansetron **OR** ondansetron (ZOFRAN) IV   Vital Signs    Vitals:   12/11/20 0812 12/11/20 0822 12/11/20 0832 12/11/20 0858  BP: 126/84 (!) 149/91 (!) 168/90 (!) 173/92  Pulse: 71 70 67 67  Resp: 12 18 15 16   Temp: 97.6 F (36.4 C)   98.4 F (36.9 C)  TempSrc: Temporal   Oral  SpO2: 100% 98% 98% 92%  Weight:      Height:        Intake/Output Summary (Last 24 hours) at 12/11/2020 1203 Last data filed at 12/11/2020 0807 Gross per 24 hour  Intake 1040 ml  Output --  Net 1040 ml   Last 3 Weights 12/05/2020 12/16/2014  Weight (lbs) 141 lb 8.6 oz 158 lb  Weight (kg) 64.2 kg 71.668 kg      Telemetry    SR - Personally Reviewed  ECG    No new ECG tracing today. - Personally Reviewed  Physical Exam   GEN: No acute distress.   Neck: No JVD. Cardiac: RRR. No murmurs, rubs, or gallops or knocks Respiratory: Clear to auscultation bilaterally. GI: Soft, non-tender, non-distended  MS: No lower extremity edema. No deformity. Skin: Warm and dry. Neuro:  non focal Psych: pleasant affect.  Labs    High Sensitivity Troponin:  No results for input(s): TROPONINIHS in the last 720 hours.    Chemistry Recent Labs  Lab 12/06/20 0138 12/07/20 0222 12/08/20 0108 12/09/20 0345 12/09/20 0501  12/10/20 0803 12/11/20 0205  NA 127* 128* 128*   < > 132* 135 134*  K 3.8 3.7 3.1*   < > 3.2* 3.7 3.6  CL 96* 96* 99   < > 104 108 107  CO2 22 23 20*   < > 21* 20* 22  GLUCOSE 111* 83 73   < > 85 79 89  BUN 11 13 16    < > 12 9 9   CREATININE 0.74 1.15* 1.13*   < > 1.05* 1.02* 0.91  CALCIUM 9.3 9.5 8.9   < > 8.6* 9.0 8.9  PROT 7.5 7.5 6.3*  --   --   --   --   ALBUMIN 4.1 3.9 3.3*  --   --   --   --   AST 90* 109* 86*  --   --   --   --   ALT 26 26 21   --   --   --   --   ALKPHOS 72 76 68  --   --   --   --   BILITOT 0.5 0.6 0.6  --   --   --   --   GFRNONAA >60 50* 51*   < > 56* 58* >60  ANIONGAP 9 9 9    < >  7 7 5    < > = values in this interval not displayed.     Hematology Recent Labs  Lab 12/09/20 0501 12/10/20 0803 12/11/20 0205  WBC 5.5 5.7 6.2  RBC 3.05* 3.08* 2.77*  HGB 9.9* 9.8* 8.9*  HCT 28.7* 30.0* 27.0*  MCV 94.1 97.4 97.5  MCH 32.5 31.8 32.1  MCHC 34.5 32.7 33.0  RDW 14.0 14.5 14.6  PLT 147* 171 187    BNPNo results for input(s): BNP, PROBNP in the last 168 hours.   DDimer No results for input(s): DDIMER in the last 168 hours.   Radiology    CT ANGIO HEAD NECK W WO CM  Result Date: 12/09/2020 CLINICAL DATA:  Stroke, follow-up EXAM: CT ANGIOGRAPHY HEAD AND NECK TECHNIQUE: Multidetector CT imaging of the head and neck was performed using the standard protocol during bolus administration of intravenous contrast. Multiplanar CT image reconstructions and MIPs were obtained to evaluate the vascular anatomy. Carotid stenosis measurements (when applicable) are obtained utilizing NASCET criteria, using the distal internal carotid diameter as the denominator. CONTRAST:  02/08/2021 OMNIPAQUE IOHEXOL 350 MG/ML SOLN COMPARISON:  None. FINDINGS: CT HEAD Brain: There is no acute intracranial hemorrhage or mass effect. Small infarct on MRI is not visible. Patchy and confluent areas of low-density in the supratentorial white matter likely reflect chronic microvascular ischemic  changes. 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 Vascular: No hyperdense vessel. Skull: No new finding. Sinuses/Orbits: No acute finding. Other: None. Review of the MIP images confirms the above findings CTA NECK Aortic arch: There is mixed plaque along the arch. Great vessel origins are patent. There is noncalcified plaque along the proximal subclavian artery origins. Right carotid system: Patent. Mild atherosclerotic wall thickening along the common carotid. Mixed plaque along the proximal internal carotid causing less than 50% stenosis. Left carotid system: Patent. Mixed plaque along the common carotid with less than 50% stenosis. Mixed plaque along the proximal internal carotid less than 50% stenosis. Vertebral arteries: Patent. Left vertebral artery arises directly from the arch. Plaque causes at least mild stenosis at the left origin. Right vertebral artery is dominant. Skeleton: Degenerative changes of the cervical spine. Other neck: Unremarkable. Upper chest: Trace pleural effusions. Review of the MIP images confirms the above findings CTA HEAD Anterior circulation: Intracranial internal carotid arteries are patent with minimal calcified plaque. Anterior cerebral arteries are patent. Left A1 ACA is dominant. Middle cerebral arteries are patent. There is mild stenosis of the left M1 MCA. Posterior circulation: Intracranial vertebral arteries are patent. Basilar artery is patent. Major cerebellar artery origins are patent. A right posterior communicating artery is present. Posterior cerebral arteries are patent. Mild bilateral P2 PCA stenoses. Venous sinuses: Patent as allowed by contrast bolus timing. Review of the MIP images confirms the above findings IMPRESSION: No acute intracranial abnormality. Small infarct on MRI is not visible. Plaque at the common and proximal internal carotid arteries with less than 50% stenosis. Mild intracranial atherosclerosis. Electronically Signed   By:  M.D.   On: 12/09/2020 18:24   DG Abd 1 View  Result Date: 12/09/2020 CLINICAL DATA:  Abdominal distension. EXAM: ABDOMEN - 1 VIEW COMPARISON:  CT abdomen and pelvis 12/16/2014. FINDINGS: There is gaseous distension of large and small bowel diffusely to the level of the sigmoid colon. Colonic loops in the mid abdomen are dilated measuring up to 8.6 cm in diameter. Small bowel loops are prominent, but not dilated by size criteria. There are rounded calcifications in the pelvis  favored as phleboliths when compared to prior CT. The visualized lung bases are clear. No acute fractures are identified. IMPRESSION: 1. Diffuse gaseous distension of the colon with dilatation of the transverse colon/right colon. This is of uncertain etiology. Findings may related to colonic ileus, partial colonic obstruction, or cecal volvulus in the appropriate clinical setting. Further evaluation with CT should be considered. Electronically Signed   By: Darliss Cheney M.D.   On: 12/09/2020 16:04   CT ABDOMEN PELVIS W CONTRAST  Result Date: 12/09/2020 CLINICAL DATA:  Abdominal distension Recent stroke. EXAM: CT ABDOMEN AND PELVIS WITH CONTRAST TECHNIQUE: Multidetector CT imaging of the abdomen and pelvis was performed using the standard protocol following bolus administration of intravenous contrast. CONTRAST:  OMNIPAQUE IOHEXOL 350 MG/ML SOLN COMPARISON:  CT 12/16/2014 FINDINGS: Lower chest: Trace bilateral pleural effusions. Associated basilar atelectasis. Atherosclerosis and tortuosity of the descending thoracic aorta. Small pericardial effusion adjacent to the right heart border. Hepatobiliary: Scattered small hepatic hypodensities that are not significantly changed from prior exam. Largest measures 16 mm in the left lobe and is consistent with simple cyst. Majority of these lesions are too small to accurately characterize. Unremarkable gallbladder. No pericholecystic inflammation or biliary dilatation. Pancreas: Unremarkable.  No pancreatic ductal dilatation or surrounding inflammatory changes. Spleen: Normal in size without focal abnormality. Adrenals/Urinary Tract: No adrenal nodule. Mild symmetric perinephric edema which was also present on prior exam. No hydronephrosis. Tiny cortical hypodensity in the posterior left kidney is too small to characterize but likely cyst. No suspicious renal lesion. No visualized renal stone. Decompressed ureters. Partially distended urinary bladder. No definite bladder wall thickening. Stomach/Bowel: Detailed bowel assessment is limited in the absence of enteric contrast. There is a small hiatal hernia. The stomach is decompressed, equivocal gastric wall thickening versus nondistention. No small bowel obstruction or inflammation. Slight increased air throughout nondilated small bowel. Normal appendix courses into the right pericolic gutter under the liver tip. There is subtle wall thickening of the cecum and ascending colon, series 9, images 24 and 40. Mild gaseous distension of transverse colon. Anti mesenteric border of transverse colon extends into an umbilical hernia, no associated wall thickening or inflammation. Moderate stool in the left colon. Sigmoid colon is redundant and courses into the central abdomen. Sigmoid colonic diverticulosis. No diverticulitis. Mild rectal wall thickening. Small volume of stool in the rectum. Vascular/Lymphatic: Moderate aortic atherosclerosis. No aortic aneurysm. Patent portal vein. No enlarged lymph nodes in the abdomen or pelvis. Reproductive: Status post hysterectomy. No adnexal masses. Other: Umbilical hernia contains fat and anti mesenteric border of the transverse colon. Trace fluid in the aneurysm sac. No ascites or free air. Musculoskeletal: Degenerative change in the lumbar spine is most prominent at L5-S1. There are no acute or suspicious osseous abnormalities. IMPRESSION: 1. Subtle wall thickening of the cecum and ascending colon, suspicious for colitis.  2. Colonic redundancy with mild distention with air and stool, query constipation and colonic dysmotility. 3. Umbilical hernia contains fat and anti mesenteric border of the transverse colon, no associated wall thickening or inflammation. 4. Sigmoid colonic diverticulosis without diverticulitis. 5. Trace bilateral pleural effusions. Small pericardial effusion adjacent to the right heart border. 6. Scattered cysts in the liver as well as low-density lesions too small to characterize. Aortic Atherosclerosis (ICD10-I70.0). Electronically Signed   By: Narda Rutherford M.D.   On: 12/09/2020 19:40   ECHO TEE  Result Date: 12/11/2020    TRANSESOPHOGEAL ECHO REPORT   Patient Name:   Jane Navarro Date of Exam: 12/11/2020  Medical Rec #:  [id number]    Height:       60.0 in Accession #:    [id number]   Weight:       141.5 lb Date of Birth:  [birth year]    BSA:          1.612 m Patient Age:    74 years     BP:           130/80 mmHg Patient Gender: F            HR:           68 bpm. Exam Location:  Inpatient Procedure: Transesophageal Echo Indications:    TIA  History:        Patient has prior history of Echocardiogram examinations.  Sonographer:    Roosvelt Maser RDCS Referring Phys: [id number] [employer] A Anye Brose PROCEDURE: After discussion of the risks and benefits of a TEE, an informed consent was obtained. The transesophogeal probe was passed without difficulty through the esophogus of the patient. Sedation performed by different physician. The patient developed no complications during the procedure. IMPRESSIONS  1. Extensive 3 D imaging Likely redundant calcified tertiarry mobile chord seen in mid/apical LV cavity represents "mass" noted on previous TTE/MRI.  2. Left ventricular ejection fraction, by estimation, is 60 to 65%. The left ventricle has normal function. There is mild left ventricular hypertrophy.  3. Right ventricular systolic function is normal. The right ventricular size is normal.  4. Left atrial size was mildly  dilated. No left atrial/left atrial appendage thrombus was detected.  5. The mitral valve is normal in structure. No evidence of mitral valve regurgitation.  6. The aortic valve is tricuspid. Aortic valve regurgitation is not visualized. No aortic stenosis is present. FINDINGS  Left Ventricle: Left ventricular ejection fraction, by estimation, is 60 to 65%. The left ventricle has normal function. The left ventricular internal cavity size was small. There is mild left ventricular hypertrophy. Right Ventricle: The right ventricular size is normal. No increase in right ventricular wall thickness. Right ventricular systolic function is normal. Left Atrium: Left atrial size was mildly dilated. No left atrial/left atrial appendage thrombus was detected. Right Atrium: Right atrial size was normal in size. Pericardium: There is no evidence of pericardial effusion. Mitral Valve: The mitral valve is normal in structure. No evidence of mitral valve regurgitation. Tricuspid Valve: The tricuspid valve is normal in structure. Tricuspid valve regurgitation is mild. Aortic Valve: The aortic valve is tricuspid. Aortic valve regurgitation is not visualized. No aortic stenosis is present. Pulmonic Valve: The pulmonic valve was normal in structure. Pulmonic valve regurgitation is trivial. Aorta: The aortic root is normal in size and structure. IAS/Shunts: The interatrial septum appears to be lipomatous. No atrial level shunt detected by color flow Doppler. 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 Charlton Haws MD Electronically signed by Charlton Haws MD Signature Date/Time: 12/11/2020/8:26:54 AM    Final     Cardiac Studies   Echocardiogram 12/06/2020: Impressions: 1. 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 It measures approximately 13 mm in maximum  length and 5 mm in width, with a  relatively narrow attachment base. Left ventricular ejection fraction, by  estimation, is 60 to 65%. The left ventricle has normal function. The left  ventricle  has no regional wall motion abnormalities. There is mild  concentric left ventricular  hypertrophy. Left ventricular diastolic parameters are consistent with  Grade I diastolic dysfunction (impaired relaxation). Elevated left atrial  pressure.   2. Right ventricular systolic function is normal. The right ventricular  size is normal.   3. The mitral valve is normal in structure. No evidence of mitral valve  regurgitation. No evidence of mitral stenosis.   4. The aortic valve is normal in structure. Aortic valve regurgitation is  not visualized. No aortic stenosis is present.   5. The inferior vena cava is normal in size with greater than 50%  respiratory variability, suggesting right atrial pressure of 3 mmHg.   Conclusion(s)/Recommendation(s): Discussed with primary team. The left  ventricular mass is highly atypical for either thrombus or vegetation and  the location is unusual for myxoma or papillary fibroelastoma. Its  mobility suggests significant embolic  potential. While TEE would offer better resolution, it may not add  diagnostic value. Consider cardiac MRI.   Patient Profile     75 y.o. female with a history of anemia, borderline hypothyroidism, and remote tobacco abuse (quit in early 2000s) but no known cardiac history who was admitted on 12/05/2020 for altered mental status and concern for acute stroke. Head CT negative for acute stroke but did show chronic appearing lacunar infarct. 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  Severe Hypothyroidism - TSH 41. - Management per primary team.  Hyponatremia - Management per primary team.  Hypokalemia - Being supplemented by primary team. - Continue to monitor.  Hyperlipidemia - Continue high-intensity statin.   [facility] will sign off.   Medication Recommendations:  ASA 81 mg PO daily (stopping AC) Other recommendations (labs, testing, etc):  NA Follow up as an outpatient:  We are working to arrange follow up    For questions or updates, please contact [facility] Please consult [email or web address] for contact info under        Signed, Christell ConstantMahesh A Bushra Denman, MD  12/11/2020, 12:03 PM

## 2020-12-11 NOTE — Transfer of Care (Signed)
Immediate Anesthesia Transfer of Care Note  Patient: Jane Navarro  Procedure(s) Performed: TRANSESOPHAGEAL ECHOCARDIOGRAM (TEE)  Patient Location: Endoscopy Unit  Anesthesia Type:MAC  Level of Consciousness: awake, alert , oriented and patient cooperative  Airway & Oxygen Therapy: Patient Spontanous Breathing and Patient connected to nasal cannula oxygen  Post-op Assessment: Report given to RN and Post -op Vital signs reviewed and stable  Post vital signs: Reviewed and stable  Last Vitals:  Vitals Value Taken Time  BP 126/84 12/11/20 0811  Temp    Pulse 71 12/11/20 0812  Resp 11 12/11/20 0812  SpO2 98 % 12/11/20 0812  Vitals shown include unvalidated device data.  Last Pain:  Vitals:   12/11/20 0700  TempSrc: Temporal  PainSc: 0-No pain      Patients Stated Pain Goal: 1 (22/58/34 6219)  Complications: No notable events documented.

## 2020-12-11 NOTE — Interval H&P Note (Signed)
History and Physical Interval Note:  12/11/2020 7:28 AM  Jane Navarro  has presented today for surgery, with the diagnosis of LV MASS.  The various methods of treatment have been discussed with the patient and family. After consideration of risks, benefits and other options for treatment, the patient has consented to  Procedure(s): TRANSESOPHAGEAL ECHOCARDIOGRAM (TEE) (N/A) as a surgical intervention.  The patient's history has been reviewed, patient examined, no change in status, stable for surgery.  I have reviewed the patient's chart and labs.  Questions were answered to the patient's satisfaction.     Charlton Haws

## 2020-12-11 NOTE — Progress Notes (Signed)
  Echocardiogram Echocardiogram Transesophageal has been performed.  Jane Navarro F 12/11/2020, 9:34 AM

## 2020-12-11 NOTE — Anesthesia Preprocedure Evaluation (Addendum)
Anesthesia Evaluation  Patient identified by MRN, date of birth, ID band Patient awake    Reviewed: Allergy & Precautions, NPO status , Patient's Chart, lab work & pertinent test results  History of Anesthesia Complications Negative for: history of anesthetic complications  Airway Mallampati: II  TM Distance: >3 FB Neck ROM: Full    Dental  (+) Dental Advisory Given   Pulmonary neg shortness of breath, neg sleep apnea, neg COPD, neg recent URI, former smoker,  Covid-19 Nucleic Acid Test Results Lab Results      Component                Value               Date                      [place]              NEGATIVE            12/05/2020              breath sounds clear to auscultation       Cardiovascular  Rhythm:Regular     Neuro/Psych CVA negative psych ROS   GI/Hepatic negative GI ROS, Neg liver ROS,   Endo/Other    Renal/GU CRFRenal diseaseLab Results      Component                Value               Date                      CREATININE               0.91                12/11/2020                Musculoskeletal   Abdominal   Peds  Hematology  (+) Blood dyscrasia, anemia , Lab Results      Component                Value               Date                      WBC                      6.2                 12/11/2020                HGB                      8.9 (L)             12/11/2020                HCT                      27.0 (L)            12/11/2020                MCV                      97.5  12/11/2020                PLT                      187                 12/11/2020              Anesthesia Other Findings [name] is a 75 y.o. female with history of kidney stone, and not regularly following with doctors admitted for confusion, slurry speech, left side weakness and low BP. No tPA given due to unclear time onset. Found to have encephalopathy due to severe hypothyroidism and  hyperlipidemia.     Encephalopathy  ? Much improved, now orientated ? No focal neuro deficit ? TSH 41 ? LDL 327 ? B12 = 206 ? LP WBC 2-4, RBC 0, protein 84, glucose 57, HSV neg ? Likely due to severe hypothyroidism ? Management per primary team  LV mass ? TTE showed highly mobile atypical hyperechogenic mass in the LV ? Cardiology was consulted and felt it was atypical for thrombus or vegetation and location unusual for myxoma or papillary fibroelastoma.  ? TEE planned on tomorrow ? MRI heart - Anterolateral papillary muscle appears apically displaced and appears bifid in the short axis. Rhabdomyoma and very small thrombus below the resolution of CMR cannot be excluded ? On heparin IV  Stroke, incidental -  left ACA small infarct embolic likely secondary to LV mass ? CT head no acute finding ? MRI Small acute infarct in the left cingulate gyrus ? CTA head and neck unremarkable ? 2D Echo  EF 60-65%, highly mobile atypical hyperechogenic mass in the LV ? LDL 327 ? HgbA1c 5.5 ? Heparin IV for VTE prophylaxis ? No antithrombotic prior to admission, now on heparin IV. ? Patient counseled to be compliant with her antithrombotic medications ? Ongoing aggressive stroke risk factor management ? Therapy recommendations:  SNF ? Disposition:  pending  Hypothyroidism ? TSH = 41 ? Thyroid US Nonvisualization of thyroid. In the absence of previous thyroidectomy, this would suggest marked parenchymal atrophy. ? On synthroid ? Management per primary team  Hypertension ? Stable ? Long term BP goal normotensive  Reproductive/Obstetrics                            Anesthesia Physical Anesthesia Plan  ASA: 3  Anesthesia Plan: MAC   Post-op Pain Management:    Induction: Intravenous  PONV Risk Score and Plan: 2 and Propofol infusion and Treatment may vary due to age or medical condition  Airway Management Planned: Nasal Cannula  Additional Equipment:  None  Intra-op Plan:   Post-operative Plan:   Informed Consent: I have reviewed the patients History and Physical, chart, labs and discussed the procedure including the risks, benefits and alternatives for the proposed anesthesia with the patient or authorized representative who has indicated his/her understanding and acceptance.     Dental advisory given  Plan Discussed with: CRNA and Anesthesiologist  Anesthesia Plan Comments:         Anesthesia Quick Evaluation

## 2020-12-11 NOTE — Progress Notes (Signed)
PROGRESS NOTE    Jane Navarro  SEG:[id number] DOB: April 20, 1945 DOA: 12/05/2020 PCP: Brooke Bonito, MD   Brief Narrative: 75 year old past medical history significant for anemia, tobacco use in the past, presents into the hospital after being found altered in her car in the [employer] driving a lot.  She apparently went in, did grocery shopping and then got back into her car and passed out.  EMS was called and she was brought to the hospital.  Family spoke to her 6 days prior to admission and she seems to be in her normal state of health.  Patient does not have a PCP and does not follows with doctors.  On admission she underwent an LP and was placed on empiric acyclovir.  She was also found to be hypothyroid and also have an intracardiac mobile mass.  Neurology and cardiology consulted and following.   Assessment & Plan:   Principal Problem:   Acute metabolic encephalopathy Active Problems:   Hypertensive urgency   CKD (chronic kidney disease), stage III (HCC)   Normocytic anemia   Transaminitis   Cerebral thrombosis with cerebral infarction   1-Severe Hypothyroidism: Patient presented with altered mental status, TSH 41.4. -Thyroid ultrasound was unable to visualize the thyroid, in the absence of previous thyroidectomy this will suggest marked parenchymal atrophy. -Continue with IV Synthroid, will consider transition to oral tomorrow if she consistently is taking oral. -She will need repeat TSH in 3 to 4 weeks.  2-Acute Metabolic Encephalopathy:  -Likely related to untreated hypothyroidism.  Also Sundowning, mimicking dementia but is likely induced by untreated prolonged hypothyroidism. -LP: CSF no growth on culture.  HSV PCR CSF negative.  IV acyclovir has been discontinued. -MRI showed acute small stroke left cingulate gyrus. -Neurology inform of MRI  results.  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 -Blood cultures no growth to date. -Currently on heparin drip. -Cardia MRI, thrombus vs Rhabdomyoma.  -Underwent TEE negative for thrombus or mass. Showed Ruptured secondary of accessory chord that is mobile and calcified. Anticoagulation stooped.   4-Hypokalemia: Replaced.  5-Lipidemia LDL 327.  Continue with statin 6- essential hypertension:  BP increasing. , will start low dose Norvasc.   Abdominal distention:  -KUB; Diffuse gaseous distension of the colon with dilatation of the transverse colon/right colon. Findings may related to colonic ileus, partial colonic obstruction,or cecal volvulus in the appropriate clinical setting -CT abdomen: Subtle wall thickening of the cecum and ascending colon suspicious for colitis.  Colonic redundancy with mild distention with air and stool query constipation of colonic dysmotility.  Umbilical hernia contains fat and antimesenteric border of the transverse colon, no associated wall thickening or inflammation.  Sigmoid colonic diverticulosis without diverticulitis. Plan for miralax, and dulcolax suppository. She had BM ,  abdomen less distended. No sign of infection. Discussed with family, she will need follow up with GI.   B12 deficiency: Continue with supplements CKD stage IIIa: Creatinine at baseline Hyponatremia: Likely hypothyroidism induced.Stable.  Hypokalemia; Replaced.  Transaminases: Pattern that raises suspicious for EtOH use.  Patient used to drink in the past, family is not aware that she is currently drinking. Stable on statins.     Estimated body mass index is 27.64 kg/m as calculated from  the following:   Height as of this encounter: 5' (1.524 m).   Weight as of this encounter: 64.2 kg.   DVT prophylaxis: On heparin drip Code Status:  Full code Family Communication: Son at bedside 9/09 Disposition Plan:  Status is: Inpatient  Remains inpatient appropriate because:IV treatments appropriate due to intensity of illness or inability to take PO  Dispo: The patient is from: Home              Anticipated d/c is to: SNF              Patient currently is not medically stable to d/c.   Difficult to place patient No        Consultants:  Neurology Cardiology  Procedures:  Echo: Showed mobile mass  Antimicrobials:    Subjective: No new complaint, had BM yesterday   Objective: Vitals:   12/11/20 0822 12/11/20 0832 12/11/20 0858 12/11/20 1217  BP: (!) 149/91 (!) 168/90 (!) 173/92 (!) 165/89  Pulse: 70 67 67 69  Resp: 18 15 16 18   Temp:   98.4 F (36.9 C) 98.2 F (36.8 C)  TempSrc:   Oral Oral  SpO2: 98% 98% 92% 99%  Weight:      Height:        Intake/Output Summary (Last 24 hours) at 12/11/2020 1436 Last data filed at 12/11/2020 02/10/2021 Gross per 24 hour  Intake 800 ml  Output --  Net 800 ml    Filed Weights   12/05/20 1200  Weight: 64.2 kg    Examination:  General exam: NAD Respiratory system: CTA Cardiovascular system: S 1, S 2 RRR Gastrointestinal system: BS present, soft, nt Central nervous system: Alert , oriented Extremities: no edema   Data Reviewed: I have personally reviewed following labs and imaging studies  CBC: Recent Labs  Lab 12/05/20 1247 12/05/20 1252 12/06/20 0138 12/07/20 0222 12/08/20 0108 12/09/20 0345 12/09/20 0501 12/10/20 0803 12/11/20 0205  WBC 5.8  --  8.1   < > 6.1 4.4 5.5 5.7 6.2  NEUTROABS 3.9  --  7.1  --   --   --   --   --   --   HGB 10.6*   < > 11.8*   < > 10.6* 8.0* 9.9* 9.8* 8.9*  HCT 32.6*   < > 34.1*   < > 31.1* 24.0* 28.7* 30.0* 27.0*  MCV 97.3  --  93.2   < > 94.0 94.5 94.1 97.4 97.5  PLT 201  --  174   < > 165 141* 147* 171 187   < > = values in this interval not displayed.    Basic Metabolic Panel: Recent Labs  Lab 12/07/20 0222  12/08/20 0108 12/09/20 0345 12/09/20 0501 12/10/20 0803 12/11/20 0205  NA 128* 128* 111* 132* 135 134*  K 3.7 3.1* 2.4* 3.2* 3.7 3.6  CL 96* 99 87* 104 108 107  CO2 23 20* 18* 21* 20* 22  GLUCOSE 83 73 828* 85 79 89  BUN 13 16 9 12 9 9   CREATININE 1.15* 1.13* 0.91 1.05* 1.02* 0.91  CALCIUM 9.5 8.9 6.8* 8.6* 9.0 8.9  MG 2.2  --   --   --   --   --   PHOS 3.0  --   --   --   --   --     GFR: Estimated Creatinine Clearance: 45.4 mL/min (by C-G formula based on SCr of 0.91 mg/dL). Liver Function Tests: Recent Labs  Lab 12/05/20 1247  12/06/20 0138 12/07/20 0222 12/08/20 0108  AST 68* 90* 109* 86*  ALT 22 26 26 21   ALKPHOS 66 72 76 68  BILITOT 0.4 0.5 0.6 0.6  PROT 7.2 7.5 7.5 6.3*  ALBUMIN 4.0 4.1 3.9 3.3*    No results for input(s): LIPASE, AMYLASE in the last 168 hours. Recent Labs  Lab 12/05/20 1952  AMMONIA 13    Coagulation Profile: Recent Labs  Lab 12/05/20 1247  INR 1.0    Cardiac Enzymes: No results for input(s): CKTOTAL, CKMB, CKMBINDEX, TROPONINI in the last 168 hours. BNP (last 3 results) No results for input(s): PROBNP in the last 8760 hours. HbA1C: No results for input(s): HGBA1C in the last 72 hours. CBG: Recent Labs  Lab 12/05/20 1248 12/09/20 0451  GLUCAP 110* 94    Lipid Profile: No results for input(s): CHOL, HDL, LDLCALC, TRIG, CHOLHDL, LDLDIRECT in the last 72 hours. Thyroid Function Tests: No results for input(s): TSH, T4TOTAL, FREET4, T3FREE, THYROIDAB in the last 72 hours. Anemia Panel: No results for input(s): VITAMINB12, FOLATE, FERRITIN, TIBC, IRON, RETICCTPCT in the last 72 hours.  Sepsis Labs: Recent Labs  Lab 12/05/20 1953  LATICACIDVEN 1.3     Recent Results (from the past 240 hour(s))  CSF culture w Stat Gram Stain     Status: None   Collection Time: 12/05/20  3:50 PM   Specimen: CSF; Cerebrospinal Fluid  Result Value Ref Range Status   Specimen Description CSF  Final   Special Requests NONE  Final   Gram  Stain   Final    WBC PRESENT, PREDOMINANTLY MONONUCLEAR NO ORGANISMS SEEN CYTOSPIN SMEAR    Culture   Final    NO GROWTH 3 DAYS Performed at [facility], [address] [place], [place] [address]    Report Status 12/09/2020 FINAL  Final  SARS CORONAVIRUS 2 (TAT 6-24 HRS) Nasopharyngeal Nasopharyngeal Swab     Status: None   Collection Time: 12/05/20  6:47 PM   Specimen: Nasopharyngeal Swab  Result Value Ref Range Status   SARS Coronavirus 2 NEGATIVE NEGATIVE Final    Comment: (NOTE) SARS-CoV-2 target nucleic acids are NOT DETECTED.  The SARS-CoV-2 RNA is generally detectable in upper and lower respiratory specimens during the acute phase of infection. Negative results do not preclude SARS-CoV-2 infection, do not rule out co-infections with other pathogens, and should not be used as the sole basis for treatment or other patient management decisions. Negative results must be combined with clinical observations, patient history, and epidemiological information. The expected result is Negative.  Fact Sheet for Patients: HairSlick.nohttps://www.fda.gov/media/138098/download  Fact Sheet for Healthcare Providers: quierodirigir.comhttps://www.fda.gov/media/138095/download  This test is not yet approved or cleared by the [place] FDA and  has been authorized for detection and/or diagnosis of SARS-CoV-2 by FDA under an Emergency Use Authorization (EUA). This EUA will remain  in effect (meaning this test can be used) for the duration of the COVID-19 declaration under Se ction 564(b)(1) of the Act, 21 U.S.C. section 360bbb-3(b)(1), unless the authorization is terminated or revoked sooner.  Performed at [facility], [address] [place], [place] [address]   Culture, blood (Routine X 2) w Reflex to ID Panel     Status: None   Collection Time: 12/06/20  4:03 PM   Specimen: BLOOD RIGHT HAND  Result Value Ref Range Status   Specimen Description BLOOD RIGHT HAND  Final   Special Requests    Final    BOTTLES DRAWN AEROBIC ONLY Blood  Culture adequate volume   Culture   Final    NO GROWTH 5 DAYS Performed at [facility], [address] [place], [place] [address]    Report Status 12/11/2020 FINAL  Final  Culture, blood (Routine X 2) w Reflex to ID Panel     Status: None   Collection Time: 12/06/20  4:12 PM   Specimen: BLOOD LEFT HAND  Result Value Ref Range Status   Specimen Description BLOOD LEFT HAND  Final   Special Requests   Final    BOTTLES DRAWN AEROBIC ONLY Blood Culture adequate volume   Culture   Final    NO GROWTH 5 DAYS Performed at [facility], [address] [place], [place] [address]    Report Status 12/11/2020 FINAL  Final          Radiology Studies: CT ANGIO HEAD NECK W WO CM  Result Date: 12/09/2020 CLINICAL DATA:  Stroke, follow-up EXAM: CT ANGIOGRAPHY HEAD AND NECK TECHNIQUE: Multidetector CT imaging of the head and neck was performed using the standard protocol during bolus administration of intravenous contrast. Multiplanar CT image reconstructions and MIPs were obtained to evaluate the vascular anatomy. Carotid stenosis measurements (when applicable) are obtained utilizing NASCET criteria, using the distal internal carotid diameter as the denominator. CONTRAST:  OMNIPAQUE IOHEXOL 350 MG/ML SOLN COMPARISON:  None. FINDINGS: CT HEAD Brain: There is no acute intracranial hemorrhage or mass effect. Small infarct on MRI is not visible. Patchy and confluent areas of low-density in the supratentorial white matter likely reflect chronic microvascular ischemic changes. 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 Vascular: No hyperdense vessel. Skull: No new finding. Sinuses/Orbits: No acute finding. Other: None. Review of the MIP images confirms the above findings CTA NECK Aortic arch: There is mixed plaque along the arch. Great vessel origins are patent. There is noncalcified plaque along the proximal subclavian artery origins. Right  carotid system: Patent. Mild atherosclerotic wall thickening along the common carotid. Mixed plaque along the proximal internal carotid causing less than 50% stenosis. Left carotid system: Patent. Mixed plaque along the common carotid with less than 50% stenosis. Mixed plaque along the proximal internal carotid less than 50% stenosis. Vertebral arteries: Patent. Left vertebral artery arises directly from the arch. Plaque causes at least mild stenosis at the left origin. Right vertebral artery is dominant. Skeleton: Degenerative changes of the cervical spine. Other neck: Unremarkable. Upper chest: Trace pleural effusions. Review of the MIP images confirms the above findings CTA HEAD Anterior circulation: Intracranial internal carotid arteries are patent with minimal calcified plaque. Anterior cerebral arteries are patent. Left A1 ACA is dominant. Middle cerebral arteries are patent. There is mild stenosis of the left M1 MCA. Posterior circulation: Intracranial vertebral arteries are patent. Basilar artery is patent. Major cerebellar artery origins are patent. A right posterior communicating artery is present. Posterior cerebral arteries are patent. Mild bilateral P2 PCA stenoses. Venous sinuses: Patent as allowed by contrast bolus timing. Review of the MIP images confirms the above findings IMPRESSION: No acute intracranial abnormality. Small infarct on MRI is not visible. Plaque at the common and proximal internal carotid arteries with less than 50% stenosis. Mild intracranial atherosclerosis. Electronically Signed   By: Guadlupe Spanish M.D.   On: 12/09/2020 18:24   CT ABDOMEN PELVIS W CONTRAST  Result Date: 12/09/2020 CLINICAL DATA:  Abdominal distension Recent stroke. EXAM: CT ABDOMEN AND PELVIS WITH CONTRAST TECHNIQUE: Multidetector CT imaging of the abdomen and pelvis was performed using the  standard protocol following bolus administration of intravenous contrast. CONTRAST:  OMNIPAQUE IOHEXOL 350 MG/ML  SOLN COMPARISON:  CT 12/16/2014 FINDINGS: Lower chest: Trace bilateral pleural effusions. Associated basilar atelectasis. Atherosclerosis and tortuosity of the descending thoracic aorta. Small pericardial effusion adjacent to the right heart border. Hepatobiliary: Scattered small hepatic hypodensities that are not significantly changed from prior exam. Largest measures 16 mm in the left lobe and is consistent with simple cyst. Majority of these lesions are too small to accurately characterize. Unremarkable gallbladder. No pericholecystic inflammation or biliary dilatation. Pancreas: Unremarkable. No pancreatic ductal dilatation or surrounding inflammatory changes. Spleen: Normal in size without focal abnormality. Adrenals/Urinary Tract: No adrenal nodule. Mild symmetric perinephric edema which was also present on prior exam. No hydronephrosis. Tiny cortical hypodensity in the posterior left kidney is too small to characterize but likely cyst. No suspicious renal lesion. No visualized renal stone. Decompressed ureters. Partially distended urinary bladder. No definite bladder wall thickening. Stomach/Bowel: Detailed bowel assessment is limited in the absence of enteric contrast. There is a small hiatal hernia. The stomach is decompressed, equivocal gastric wall thickening versus nondistention. No small bowel obstruction or inflammation. Slight increased air throughout nondilated small bowel. Normal appendix courses into the right pericolic gutter under the liver tip. There is subtle wall thickening of the cecum and ascending colon, series 9, images 24 and 40. Mild gaseous distension of transverse colon. Anti mesenteric border of transverse colon extends into an umbilical hernia, no associated wall thickening or inflammation. Moderate stool in the left colon. Sigmoid colon is redundant and courses into the central abdomen. Sigmoid colonic diverticulosis. No diverticulitis. Mild rectal wall thickening. Small volume of  stool in the rectum. Vascular/Lymphatic: Moderate aortic atherosclerosis. No aortic aneurysm. Patent portal vein. No enlarged lymph nodes in the abdomen or pelvis. Reproductive: Status post hysterectomy. No adnexal masses. Other: Umbilical hernia contains fat and anti mesenteric border of the transverse colon. Trace fluid in the aneurysm sac. No ascites or free air. Musculoskeletal: Degenerative change in the lumbar spine is most prominent at L5-S1. There are no acute or suspicious osseous abnormalities. IMPRESSION: 1. Subtle wall thickening of the cecum and ascending colon, suspicious for colitis. 2. Colonic redundancy with mild distention with air and stool, query constipation and colonic dysmotility. 3. Umbilical hernia contains fat and anti mesenteric border of the transverse colon, no associated wall thickening or inflammation. 4. Sigmoid colonic diverticulosis without diverticulitis. 5. Trace bilateral pleural effusions. Small pericardial effusion adjacent to the right heart border. 6. Scattered cysts in the liver as well as low-density lesions too small to characterize. Aortic Atherosclerosis (ICD10-I70.0). Electronically Signed   By: Narda Rutherford M.D.   On: 12/09/2020 19:40   ECHO TEE  Result Date: 12/11/2020    TRANSESOPHOGEAL ECHO REPORT   Patient Name:   CACEY WILLOW Date of Exam: 12/11/2020 Medical Rec #:  [id number]    Height:       60.0 in Accession #:    [id number]   Weight:       141.5 lb Date of Birth:  03/01/1946    BSA:          1.612 m Patient Age:    74 years     BP:           130/80 mmHg Patient Gender: F            HR:           68 bpm. Exam Location:  Inpatient Procedure: Transesophageal Echo Indications:  TIA  History:        Patient has prior history of Echocardiogram examinations.  Sonographer:    Roosvelt Maser RDCS Referring Phys: [id number] [facility] A CHANDRASEKHAR PROCEDURE: After discussion of the risks and benefits of a TEE, an informed consent was obtained. The transesophogeal probe  was passed without difficulty through the esophogus of the patient. Sedation performed by different physician. The patient developed no complications during the procedure. IMPRESSIONS  1. Extensive 3 D imaging Likely redundant calcified tertiarry mobile chord seen in mid/apical LV cavity represents "mass" noted on previous TTE/MRI.  2. Left ventricular ejection fraction, by estimation, is 60 to 65%. The left ventricle has normal function. There is mild left ventricular hypertrophy.  3. Right ventricular systolic function is normal. The right ventricular size is normal.  4. Left atrial size was mildly dilated. No left atrial/left atrial appendage thrombus was detected.  5. The mitral valve is normal in structure. No evidence of mitral valve regurgitation.  6. The aortic valve is tricuspid. Aortic valve regurgitation is not visualized. No aortic stenosis is present. FINDINGS  Left Ventricle: Left ventricular ejection fraction, by estimation, is 60 to 65%. The left ventricle has normal function. The left ventricular internal cavity size was small. There is mild left ventricular hypertrophy. Right Ventricle: The right ventricular size is normal. No increase in right ventricular wall thickness. Right ventricular systolic function is normal. Left Atrium: Left atrial size was mildly dilated. No left atrial/left atrial appendage thrombus was detected. Right Atrium: Right atrial size was normal in size. Pericardium: There is no evidence of pericardial effusion. Mitral Valve: The mitral valve is normal in structure. No evidence of mitral valve regurgitation. Tricuspid Valve: The tricuspid valve is normal in structure. Tricuspid valve regurgitation is mild. Aortic Valve: The aortic valve is tricuspid. Aortic valve regurgitation is not visualized. No aortic stenosis is present. Pulmonic Valve: The pulmonic valve was normal in structure. Pulmonic valve regurgitation is trivial. Aorta: The aortic root is normal in size and  structure. IAS/Shunts: The interatrial septum appears to be lipomatous. No atrial level shunt detected by color flow Doppler. 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 Charlton Haws MD Electronically signed by Charlton Haws MD Signature Date/Time: 12/11/2020/8:26:54 AM    Final         Scheduled Meds:  aspirin EC  81 mg Oral Daily   atorvastatin  80 mg Oral Daily   clopidogrel  75 mg Oral Daily   [START ON 12/12/2020] levothyroxine  100 mcg Oral Q0600   polyethylene glycol  17 g Oral Daily   sodium chloride flush  3 mL Intravenous Q12H   [START ON 12/13/2020] thiamine  100 mg Oral Daily   vitamin B-12  100 mcg Oral Daily   Continuous Infusions:  thiamine injection 250 mg (12/11/20 1108)     LOS: 6 days    Time spent: 35 minutes.     Alba Cory, MD [facility]   If 7PM-7AM, please contact night-coverage [email or web address]  12/11/2020, 2:36 PM

## 2020-12-12 ENCOUNTER — Encounter (HOSPITAL_COMMUNITY): Payer: Self-pay | Admitting: Cardiovascular Disease

## 2020-12-12 DIAGNOSIS — G9341 Metabolic encephalopathy: Secondary | ICD-10-CM | POA: Diagnosis not present

## 2020-12-12 LAB — SARS CORONAVIRUS 2 (TAT 6-24 HRS): SARS Coronavirus 2: NEGATIVE

## 2020-12-12 MED ORDER — POLYETHYLENE GLYCOL 3350 17 G PO PACK: 17.0000 g | PACK | Freq: Two times a day (BID) | ORAL | 0 refills | Status: DC

## 2020-12-12 MED ORDER — ATORVASTATIN CALCIUM 80 MG PO TABS: 80.0000 mg | ORAL_TABLET | Freq: Every day | ORAL | 3 refills | Status: DC

## 2020-12-12 MED ORDER — ASPIRIN 81 MG PO TBEC: 81.0000 mg | DELAYED_RELEASE_TABLET | Freq: Every day | ORAL | 11 refills | Status: AC

## 2020-12-12 MED ORDER — BISACODYL 10 MG RE SUPP: 10.0000 mg | Freq: Every day | RECTAL | 0 refills | Status: DC | PRN

## 2020-12-12 MED ORDER — CYANOCOBALAMIN 100 MCG PO TABS: 100.0000 ug | ORAL_TABLET | Freq: Every day | ORAL | 0 refills | Status: DC

## 2020-12-12 MED ORDER — CLOPIDOGREL BISULFATE 75 MG PO TABS: 75.0000 mg | ORAL_TABLET | Freq: Every day | ORAL | 0 refills | Status: DC

## 2020-12-12 MED ORDER — LEVOTHYROXINE SODIUM 100 MCG PO TABS: 100.0000 ug | ORAL_TABLET | Freq: Every day | ORAL | 3 refills | Status: DC

## 2020-12-12 MED ORDER — BISACODYL 10 MG RE SUPP: 10.0000 mg | Freq: Every day | RECTAL | Status: DC | PRN

## 2020-12-12 MED ORDER — AMLODIPINE BESYLATE 5 MG PO TABS: 5.0000 mg | ORAL_TABLET | Freq: Every day | ORAL | 3 refills | Status: DC

## 2020-12-12 MED ORDER — THIAMINE HCL 100 MG PO TABS: 100.0000 mg | ORAL_TABLET | Freq: Every day | ORAL | 0 refills | Status: DC

## 2020-12-12 NOTE — TOC Transition Note (Signed)
Transition of Care [facility]) - CM/SW Discharge Note   Patient Details  Name: Jane Navarro MRN: [id number] Date of Birth: [birth year]  Transition of [facility]) CM/SW Contact:  Carley Hammed, LCSWA Phone Number: 12/12/2020, 11:49 AM   Clinical Narrative:    Pt to be transported to [facility] via [place]. Nurse to call report to [phone] Rm# [phone]   Final next level of care: Skilled Nursing Facility Barriers to Discharge: Barriers Resolved   Patient Goals and CMS Choice Patient states their goals for this hospitalization and ongoing recovery are:: patient unable to participate in goal setting, not fully oriented CMS Medicare.gov Compare Post Acute Care list provided to:: Patient Represenative (must comment) Choice offered to / list presented to : Adult Children  Discharge Placement              Patient [facility] bed at: [facility] Patient to be transferred to facility by: [facility] Name of family member notified: [name] Patient and family notified of of transfer: 12/12/20  Discharge Plan and Services     Post Acute Care Choice: Skilled Nursing Facility                               Social Determinants of Health (SDOH) Interventions     Readmission Risk Interventions No flowsheet data found.

## 2020-12-12 NOTE — Discharge Summary (Signed)
Physician Discharge Summary  Nadalyn Deringer AVW:[id number] DOB: Oct 03, 1945 DOA: 12/05/2020  PCP: Brooke Bonito, MD  Admit date: 12/05/2020 Discharge date: 12/12/2020  Admitted From: Home  Disposition: SNF  Recommendations for Outpatient Follow-up:  Follow up with PCP in 1-2 weeks Please obtain BMP/CBC in one week Needs repeat CT scan or colonoscopy to follow on colonic distension. Needs daily bowel regimen.  Needs to follow up with cardiology and neurology out patient.  Needs repeat TSH and Lipid panel in 6 weeks.     Discharge Condition: Stable.  CODE STATUS: Full Code Diet recommendation:  Dysphagia 2 diet  Brief/Interim Summary: 75 year old past medical history significant for anemia, tobacco use in the past, presents into the hospital after being found altered in her car in the [employer] driving a lot.  She apparently went in, did grocery shopping and then got back into her car and passed out.  EMS was called and she was brought to the hospital.  Family spoke to her 6 days prior to admission and she seems to be in her normal state of health.  Patient does not have a PCP and does not follows with doctors.  On admission she underwent an LP and was placed on empiric acyclovir.  She was also found to be hypothyroid and also have an intracardiac mobile mass.  Neurology and cardiology consulted and following.     1-Severe Hypothyroidism: Patient presented with altered mental status, TSH 41.4. -Thyroid ultrasound was unable to visualize the thyroid, in the absence of previous thyroidectomy this will suggest marked parenchymal atrophy. -Continue with IV Synthroid, will consider transition to oral tomorrow if she consistently is taking oral. -She will need repeat TSH in 3 to 4 weeks.   2-Acute Metabolic Encephalopathy:  -Likely related to untreated hypothyroidism.  Also Sundowning, mimicking dementia but is likely induced by untreated prolonged hypothyroidism. -LP: CSF no growth on culture.   HSV PCR CSF negative.  IV acyclovir has been discontinued. -MRI showed acute small stroke left cingulate gyrus. Improving.    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 -Blood cultures no growth to date. -Currently on heparin drip. -Cardia MRI, thrombus vs Rhabdomyoma.  -Underwent TEE negative for thrombus or mass. Showed Ruptured secondary of accessory chord that is mobile and calcified. Anticoagulation stooped.    4-Acute small stroke left cingulate gyrus. Continue with statins.  Neurology recommend aspirin and plavix for 3 weeks then aspirin alone.  Hypokalemia: Replaced.  5-Lipidemia LDL 327.  Continue with statin 6- essential hypertension:  BP increasing, started low dose Norvasc.    Abdominal distention:  -KUB; Diffuse gaseous distension of the colon with dilatation of the transverse colon/right colon. Findings may related to colonic ileus, partial colonic obstruction,or cecal volvulus in the appropriate clinical setting -CT abdomen: Subtle wall thickening of the cecum and ascending colon suspicious for colitis.  Colonic redundancy with mild distention with air and stool query constipation of colonic dysmotility.  Umbilical hernia contains fat and antimesenteric border of the transverse colon, no associated wall thickening or inflammation.  Sigmoid colonic diverticulosis without diverticulitis. Plan for miralax, and dulcolax suppository. She had BM ,  abdomen less distended. No sign of infection. Discussed with family, she will need follow up with  GI.  Having BM. Respond well to dulcolax suppository   B12 deficiency: Continue with supplements CKD stage IIIa: Creatinine at baseline Hyponatremia: Likely  hypothyroidism induced.Stable.  Hypokalemia; Replaced.  Transaminases: Pattern that raises suspicious for EtOH use.  Patient used to drink in the past, family is not aware that she is currently drinking. Stable on statins.        Discharge Diagnoses:  Principal Problem:   Acute metabolic encephalopathy Active Problems:   Hypertensive urgency   CKD (chronic kidney disease), stage III (HCC)   Normocytic anemia   Transaminitis   Cerebral thrombosis with cerebral infarction    Discharge Instructions  Discharge Instructions     Ambulatory referral to Neurology   Complete by: As directed    Follow up with stroke clinic NP (Jessica Vanschaick or Darrol Angel, if both not available, consider Manson Allan, or Ahern) at [facility] in about 4 weeks. Thanks.   Diet - low sodium heart healthy   Complete by: As directed    Increase activity slowly   Complete by: As directed       Allergies as of 12/12/2020       Reactions   Ampicillin    Ciprofloxacin    Erythromycin    Penicillins         Medication List     TAKE these medications    amLODipine 5 MG tablet Commonly known as: NORVASC Take 1 tablet (5 mg total) by mouth daily. Start taking on: December 13, 2020   aspirin 81 MG EC tablet Take 1 tablet (81 mg total) by mouth daily. Swallow whole. Start taking on: December 13, 2020   atorvastatin 80 MG tablet Commonly known as: LIPITOR Take 1 tablet (80 mg total) by mouth daily. Start taking on: December 13, 2020   bisacodyl 10 MG suppository Commonly known as: DULCOLAX Place 1 suppository (10 mg total) rectally daily as needed for moderate constipation.   clopidogrel 75 MG tablet Commonly known as: PLAVIX Take 1 tablet (75 mg total) by mouth daily. Start taking on: December 13, 2020   cyanocobalamin 100 MCG tablet Take 1 tablet (100 mcg total) by mouth daily. Start taking on: December 13, 2020   levothyroxine 100 MCG tablet Commonly known as:  SYNTHROID Take 1 tablet (100 mcg total) by mouth daily at 6 (six) AM. Start taking on: December 13, 2020   polyethylene glycol 17 g packet Commonly known as: MIRALAX / GLYCOLAX Take 17 g by mouth 2 (two) times daily.   thiamine 100 MG tablet Take 1 tablet (100 mg total) by mouth daily. Start taking on: December 13, 2020        Contact information for follow-up providers     [facility]. Schedule an appointment as soon as possible for a visit in 1 month(s).   Specialty: Neurology Why: stroke clinic Contact information: [address] [place] [address] [phone]        Parke Poisson, MD Follow up on 01/21/2021.   Specialties: Cardiology, Radiology Why: Please arrive 15 minutes early for your 8:20 am post-hospital follow-up. Contact information: [address] [place] [address] [phone]              Contact information for after-discharge care     Destination     [facility] Preferred SNF .   Service: Skilled Nursing Contact information: [address] [place] [address] [phone]  Allergies  Allergen Reactions   Ampicillin    Ciprofloxacin    Erythromycin    Penicillins     Consultations: Cardiology Neurology   Procedures/Studies: CT ANGIO HEAD NECK W WO CM  Result Date: 12/09/2020 CLINICAL DATA:  Stroke, follow-up EXAM: CT ANGIOGRAPHY HEAD AND NECK TECHNIQUE: Multidetector CT imaging of the head and neck was performed using the standard protocol during bolus administration of intravenous contrast. Multiplanar CT image reconstructions and MIPs were obtained to evaluate the vascular anatomy. Carotid stenosis measurements (when applicable) are obtained utilizing NASCET criteria, using the distal internal carotid diameter as the denominator. CONTRAST:  OMNIPAQUE IOHEXOL 350 MG/ML SOLN COMPARISON:  None. FINDINGS: CT  HEAD Brain: There is no acute intracranial hemorrhage or mass effect. Small infarct on MRI is not visible. Patchy and confluent areas of low-density in the supratentorial white matter likely reflect chronic microvascular ischemic changes. 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 Vascular: No hyperdense vessel. Skull: No new finding. Sinuses/Orbits: No acute finding. Other: None. Review of the MIP images confirms the above findings CTA NECK Aortic arch: There is mixed plaque along the arch. Great vessel origins are patent. There is noncalcified plaque along the proximal subclavian artery origins. Right carotid system: Patent. Mild atherosclerotic wall thickening along the common carotid. Mixed plaque along the proximal internal carotid causing less than 50% stenosis. Left carotid system: Patent. Mixed plaque along the common carotid with less than 50% stenosis. Mixed plaque along the proximal internal carotid less than 50% stenosis. Vertebral arteries: Patent. Left vertebral artery arises directly from the arch. Plaque causes at least mild stenosis at the left origin. Right vertebral artery is dominant. Skeleton: Degenerative changes of the cervical spine. Other neck: Unremarkable. Upper chest: Trace pleural effusions. Review of the MIP images confirms the above findings CTA HEAD Anterior circulation: Intracranial internal carotid arteries are patent with minimal calcified plaque. Anterior cerebral arteries are patent. Left A1 ACA is dominant. Middle cerebral arteries are patent. There is mild stenosis of the left M1 MCA. Posterior circulation: Intracranial vertebral arteries are patent. Basilar artery is patent. Major cerebellar artery origins are patent. A right posterior communicating artery is present. Posterior cerebral arteries are patent. Mild bilateral P2 PCA stenoses. Venous sinuses: Patent as allowed by contrast bolus timing. Review of the MIP images confirms the above findings IMPRESSION: No acute  intracranial abnormality. Small infarct on MRI is not visible. Plaque at the common and proximal internal carotid arteries with less than 50% stenosis. Mild intracranial atherosclerosis. Electronically Signed   By: Guadlupe Spanish M.D.   On: 12/09/2020 18:24   DG Abd 1 View  Result Date: 12/09/2020 CLINICAL DATA:  Abdominal distension. EXAM: ABDOMEN - 1 VIEW COMPARISON:  CT abdomen and pelvis 12/16/2014. FINDINGS: There is gaseous distension of large and small bowel diffusely to the level of the sigmoid colon. Colonic loops in the mid abdomen are dilated measuring up to 8.6 cm in diameter. Small bowel loops are prominent, but not dilated by size criteria. There are rounded calcifications in the pelvis favored as phleboliths when compared to prior CT. The visualized lung bases are clear. No acute fractures are identified. IMPRESSION: 1. Diffuse gaseous distension of the colon with dilatation of the transverse colon/right colon. This is of uncertain etiology. Findings may related to colonic ileus, partial colonic obstruction, or cecal volvulus in the appropriate clinical setting. Further evaluation with CT should be considered. Electronically Signed   By: Darliss Cheney M.D.   On: 12/09/2020 16:04  MR BRAIN WO CONTRAST  Result Date: 12/08/2020 CLINICAL DATA:  Mental status change, unknown cause. EXAM: MRI HEAD WITHOUT CONTRAST TECHNIQUE: Multiplanar, multiecho pulse sequences of the brain and surrounding structures were obtained without intravenous contrast. COMPARISON:  Head CT 12/05/2020 FINDINGS: The study is mildly motion degraded. Brain: There is a 1 cm acute infarct involving the mid to posterior aspect of the left cingulate gyrus. Patchy T2 hyperintensities in the cerebral white matter bilaterally are nonspecific but compatible with moderate chronic small vessel ischemic disease. There is a chronic lacunar infarct involving the body of the right caudate nucleus. A chronic microhemorrhage is noted in the  region of the left external capsule. There is mild-to-moderate cerebral atrophy. No mass, midline shift, or extra-axial fluid collection is identified. Vascular: Major intracranial vascular flow voids are preserved. Skull and upper cervical spine: Unremarkable bone marrow signal. Sinuses/Orbits: Unremarkable orbits. Minimal bilateral ethmoid air cell mucosal thickening. Trace left mastoid fluid. Other: None. IMPRESSION: 1. Small acute infarct in the left cingulate gyrus. 2. Moderate chronic small vessel ischemic disease. Electronically Signed   By: Sebastian Ache M.D.   On: 12/08/2020 18:40   CT ABDOMEN PELVIS W CONTRAST  Result Date: 12/09/2020 CLINICAL DATA:  Abdominal distension Recent stroke. EXAM: CT ABDOMEN AND PELVIS WITH CONTRAST TECHNIQUE: Multidetector CT imaging of the abdomen and pelvis was performed using the standard protocol following bolus administration of intravenous contrast. CONTRAST:  OMNIPAQUE IOHEXOL 350 MG/ML SOLN COMPARISON:  CT 12/16/2014 FINDINGS: Lower chest: Trace bilateral pleural effusions. Associated basilar atelectasis. Atherosclerosis and tortuosity of the descending thoracic aorta. Small pericardial effusion adjacent to the right heart border. Hepatobiliary: Scattered small hepatic hypodensities that are not significantly changed from prior exam. Largest measures 16 mm in the left lobe and is consistent with simple cyst. Majority of these lesions are too small to accurately characterize. Unremarkable gallbladder. No pericholecystic inflammation or biliary dilatation. Pancreas: Unremarkable. No pancreatic ductal dilatation or surrounding inflammatory changes. Spleen: Normal in size without focal abnormality. Adrenals/Urinary Tract: No adrenal nodule. Mild symmetric perinephric edema which was also present on prior exam. No hydronephrosis. Tiny cortical hypodensity in the posterior left kidney is too small to characterize but likely cyst. No suspicious renal lesion. No  visualized renal stone. Decompressed ureters. Partially distended urinary bladder. No definite bladder wall thickening. Stomach/Bowel: Detailed bowel assessment is limited in the absence of enteric contrast. There is a small hiatal hernia. The stomach is decompressed, equivocal gastric wall thickening versus nondistention. No small bowel obstruction or inflammation. Slight increased air throughout nondilated small bowel. Normal appendix courses into the right pericolic gutter under the liver tip. There is subtle wall thickening of the cecum and ascending colon, series 9, images 24 and 40. Mild gaseous distension of transverse colon. Anti mesenteric border of transverse colon extends into an umbilical hernia, no associated wall thickening or inflammation. Moderate stool in the left colon. Sigmoid colon is redundant and courses into the central abdomen. Sigmoid colonic diverticulosis. No diverticulitis. Mild rectal wall thickening. Small volume of stool in the rectum. Vascular/Lymphatic: Moderate aortic atherosclerosis. No aortic aneurysm. Patent portal vein. No enlarged lymph nodes in the abdomen or pelvis. Reproductive: Status post hysterectomy. No adnexal masses. Other: Umbilical hernia contains fat and anti mesenteric border of the transverse colon. Trace fluid in the aneurysm sac. No ascites or free air. Musculoskeletal: Degenerative change in the lumbar spine is most prominent at L5-S1. There are no acute or suspicious osseous abnormalities. IMPRESSION: 1. Subtle wall thickening of the cecum and  ascending colon, suspicious for colitis. 2. Colonic redundancy with mild distention with air and stool, query constipation and colonic dysmotility. 3. Umbilical hernia contains fat and anti mesenteric border of the transverse colon, no associated wall thickening or inflammation. 4. Sigmoid colonic diverticulosis without diverticulitis. 5. Trace bilateral pleural effusions. Small pericardial effusion adjacent to the right  heart border. 6. Scattered cysts in the liver as well as low-density lesions too small to characterize. Aortic Atherosclerosis (ICD10-I70.0). Electronically Signed   By: Narda Rutherford M.D.   On: 12/09/2020 19:40   MR CARDIAC MORPHOLOGY W WO CONTRAST  Result Date: 12/09/2020 CLINICAL DATA:  Clinical question of papillary mass EXAM: CARDIAC MRI TECHNIQUE: The patient was scanned on a 1.5 Tesla GE magnet. A dedicated cardiac coil was used. Functional imaging was done using Fiesta sequences. 2,3, and 4 chamber views were done to assess for RWMA's. Modified Simpson's rule using a short axis stack was used to calculate an ejection fraction on a dedicated work [employer]. The patient received 10 cc of Gadavist. After 10 minutes inversion recovery sequences were used to assess for infiltration and scar tissue. CONTRAST:  10 cc  of Gadavist FINDINGS: 1. Normal left ventricular size, with LVEDD 37 mm, and LVEDVi 54 mL/m2. Normal left ventricular thickness, with intraventricular septal thickness of 13 mm, posterior wall thickness of 9 mm, and septal to posterior ratio < 1.5; myocardial mass index 56 g/m2. Normal left ventricular systolic function (LVEF =60%). There are no regional wall motion abnormalities. Left ventricular parametric mapping notable for elevated native T1 signal (basal inferior and inferolateral 1260 ms) and grossly elevated T2 signal (60 ms). There is no late gadolinium enhancement in the left ventricular myocardium meeting 5 standard deviation from normal criteria. Anterolateral papillary muscle appears apically displaced and appears bifid in the short axis. No evidence of significant hypertrophy. The is no systolic anterior motion of the mitral valve. Papillary muscle is isointense on gradient echo not suggestive of calcification. Papillary muscle is isointense to myocardium on LGE sequences, not suggestive of thrombus. Papillary muscle is isointense to myocardium on T2 signal, not  suggestive of myxoma. Papillary muscle perfuses with the rest of the myocardium. 2. Normal right ventricular size with RVEDVI 60 mL/m2. Normal right ventricular thickness. Normal right ventricular systolic function (RVEF =67 %). There are no regional wall motion abnormalities or aneurysms. 3.  Normal left and right atrial size. 4. Normal size of the aortic root, ascending aorta and pulmonary artery. 5.  No significant valvular abnormalities. 6.  Normal pericardium.  No pericardial effusion. 7. Grossly, no extracardiac findings. Recommended dedicated study if concerned for non-cardiac pathology. 8. Significant breathhold artifact through entire study. This decreases the accuracy of volumetric assessment, LGE assessment, and that of parametric mapping. IMPRESSION: Significant artifact. Anterolateral papillary muscle appears apically displaced and appears bifid in the short axis. Rhabdomyoma and very small thrombus below the resolution of CMR cannot be excluded. Riley Lam MD Electronically Signed   By: Riley Lam M.D.   On: 12/09/2020 20:15   ECHOCARDIOGRAM COMPLETE  Result Date: 12/06/2020    ECHOCARDIOGRAM REPORT   Patient Name:   SANVI EHLER Date of Exam: 12/06/2020 Medical Rec #:  [id number]    Height:       60.0 in Accession #:    [id number]   Weight:       141.5 lb Date of Birth:  [birth year]    BSA:          1.612 m Patient  Age:    74 years     BP:           150/92 mmHg Patient Gender: F            HR:           72 bpm. Exam Location:  Inpatient Procedure: 2D Echo, Cardiac Doppler and Color Doppler Indications:    Stroke I63.9  History:        Patient has no prior history of Echocardiogram examinations.                 Signs/Symptoms:Altered Mental Status; Risk Factors:Former                 Smoker. Hypertensive urgency. Hypothyroidism. Passed out in                 parking lot after shopping.  Sonographer:    Roosvelt Maser RDCS Referring Phys: [id number] [facility] IMPRESSIONS  1. 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 It measures approximately 13 mm in maximum length and 5 mm in width, with a relatively narrow attachment base. Left ventricular ejection fraction, by estimation, is 60 to 65%. The left ventricle has normal function. The left ventricle has no regional wall motion abnormalities. There is mild concentric left ventricular hypertrophy. Left ventricular diastolic parameters are consistent with Grade I diastolic dysfunction (impaired relaxation). Elevated left atrial pressure.  2. Right ventricular systolic function is normal. The right ventricular size is normal.  3. The mitral valve is normal in structure. No evidence of mitral valve regurgitation. No evidence of mitral stenosis.  4. The aortic valve is normal in structure. Aortic valve regurgitation is not visualized. No aortic stenosis is present.  5. The inferior vena cava is normal in size with greater than 50% respiratory variability, suggesting right atrial pressure of 3 mmHg. Conclusion(s)/Recommendation(s): Discussed with primary team. The left ventricular mass is highly atypical for either thrombus or vegetation and the location is unusual for myxoma or papillary fibroelastoma. Its mobility suggests significant embolic potential. While TEE would offer better resolution, it may not add diagnostic value. Consider cardiac MRI. 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 It measures approximately 13 mm in maximum length and 5 mm in width, with a relatively narrow attachment base. Left ventricular ejection fraction, by estimation, is 60 to 65%. The left ventricle has normal function. The left ventricle has no regional wall motion abnormalities. The left  ventricular internal cavity size was normal in size. There is mild concentric left ventricular hypertrophy. Left ventricular diastolic parameters are consistent with Grade I diastolic dysfunction (impaired relaxation). Elevated left atrial pressure. Right Ventricle: The right ventricular size is normal. No increase in right ventricular wall thickness. Right ventricular systolic function is normal. Left Atrium: Left atrial size was normal in size. Right Atrium: Right atrial size was normal in size. Pericardium: There is no evidence of pericardial effusion. Mitral Valve: The mitral valve is normal in structure. No evidence of mitral valve regurgitation. No evidence of mitral valve stenosis. Tricuspid Valve: The tricuspid valve is normal in structure. Tricuspid valve regurgitation is not demonstrated. No evidence of tricuspid stenosis. Aortic Valve: The aortic valve is normal in structure. Aortic valve regurgitation is not visualized. No aortic  stenosis is present. Aortic valve mean gradient measures 5.0 mmHg. Aortic valve peak gradient measures 8.1 mmHg. Aortic valve area, by VTI measures 1.71 cm. Pulmonic Valve: The pulmonic valve was normal in structure. Pulmonic valve regurgitation is not visualized. No evidence of pulmonic stenosis. Aorta: The aortic root is normal in size and structure. Venous: The inferior vena cava is normal in size with greater than 50% respiratory variability, suggesting right atrial pressure of 3 mmHg. IAS/Shunts: No atrial level shunt detected by color flow Doppler.  LEFT VENTRICLE PLAX 2D LVIDd:         3.60 cm LVIDs:         2.60 cm  Diastology LV PW:         1.20 cm  LV e' medial:    3.97 cm/s LV IVS:        1.50 cm  LV E/e' medial:  16.8 LVOT diam:     1.70 cm  LV e' lateral:   5.35 cm/s LV SV:         49       LV E/e' lateral: 12.5 LV SV Index:   30 LVOT Area:     2.27 cm  RIGHT VENTRICLE RV Basal diam:  3.00 cm LEFT ATRIUM             Index       RIGHT ATRIUM           Index LA  diam:        2.10 cm 1.30 cm/m  RA Area:     10.60 cm LA Vol (A2C):   36.7 ml 22.77 ml/m RA Volume:   21.10 ml  13.09 ml/m LA Vol (A4C):   21.6 ml 13.40 ml/m LA Biplane Vol: 30.1 ml 18.68 ml/m  AORTIC VALVE AV Area (Vmax):    1.65 cm AV Area (Vmean):   1.48 cm AV Area (VTI):     1.71 cm AV Vmax:           142.00 cm/s AV Vmean:          105.000 cm/s AV VTI:            0.285 m AV Peak Grad:      8.1 mmHg AV Mean Grad:      5.0 mmHg LVOT Vmax:         103.00 cm/s LVOT Vmean:        68.300 cm/s LVOT VTI:          0.215 m LVOT/AV VTI ratio: 0.75  AORTA Ao Root diam: 2.40 cm MITRAL VALVE MV Area (PHT): 2.29 cm     SHUNTS MV Decel Time: 332 msec     Systemic VTI:  0.22 m MV E velocity: 66.80 cm/s   Systemic Diam: 1.70 cm MV A velocity: 109.00 cm/s MV E/A ratio:  0.61 Mihai Croitoru MD Electronically signed by Thurmon Fair MD Signature Date/Time: 12/06/2020/2:42:42 PM    Final    ECHO TEE  Result Date: 12/11/2020    TRANSESOPHOGEAL ECHO REPORT   Patient Name:   ELMA SHANDS Date of Exam: 12/11/2020 Medical Rec #:  [id number]    Height:       60.0 in Accession #:    [id number]   Weight:       141.5 lb Date of Birth:  [birth year]    BSA:          1.612 m Patient Age:    74 years     BP:  130/80 mmHg Patient Gender: F            HR:           68 bpm. Exam Location:  Inpatient Procedure: Transesophageal Echo Indications:    TIA  History:        Patient has prior history of Echocardiogram examinations.  Sonographer:    Roosvelt Maserachel Lane RDCS Referring Phys: [id number] [facility] PROCEDURE: After discussion of the risks and benefits of a TEE, an informed consent was obtained. The transesophogeal probe was passed without difficulty through the esophogus of the patient. Sedation performed by different physician. The patient developed no complications during the procedure. IMPRESSIONS  1. 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  2. Left ventricular ejection fraction, by estimation, is 60 to 65%. The left ventricle has normal function. There is mild left ventricular hypertrophy.  3. Right ventricular systolic function is normal. The right ventricular size is normal.  4. Left atrial size was mildly dilated. No left atrial/left atrial appendage thrombus was detected.  5. The mitral valve is normal in structure. No evidence of mitral valve regurgitation.  6. The aortic valve is tricuspid. Aortic valve regurgitation is not visualized. No aortic stenosis is present. FINDINGS  Left Ventricle: Left ventricular ejection fraction, by estimation, is 60 to 65%. The left ventricle has normal function. The left ventricular internal cavity size was small. There is mild left ventricular hypertrophy. Right Ventricle: The right ventricular size is normal. No increase in right ventricular wall thickness. Right ventricular systolic function is normal. Left Atrium: Left atrial size was mildly dilated. No left atrial/left atrial appendage thrombus was detected. Right Atrium: Right atrial size was normal in size. Pericardium: There is no evidence of pericardial effusion. Mitral Valve: The mitral valve is normal in structure. No evidence of mitral valve regurgitation. Tricuspid Valve: The tricuspid valve is normal in structure. Tricuspid valve regurgitation is mild. Aortic Valve: The aortic valve is tricuspid. Aortic valve regurgitation is not visualized. No aortic stenosis is present. Pulmonic Valve: The pulmonic valve was normal in structure. Pulmonic valve regurgitation is trivial. Aorta: The aortic root is normal in size and structure. IAS/Shunts: The interatrial septum appears to be lipomatous. No atrial level shunt detected by color flow Doppler. 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 Charlton HawsPeter Nishan MD  Electronically signed by Charlton HawsPeter Nishan MD Signature Date/Time: 12/11/2020/8:26:54 AM    Final    US THYROID  Result Date: 12/06/2020 CLINICAL DATA:  Goiter.  Elevated TSH EXAM: THYROID ULTRASOUND TECHNIQUE: Ultrasound examination of the thyroid gland and adjacent soft tissues was performed. COMPARISON:  None. FINDINGS: Parenchymal Echotexture: None identified Isthmus: Not visualized Right lobe: Not visualized Left lobe: Not visualized _________________________________________________________ Estimated total number of nodules >/= 1 cm: 0 Number of spongiform nodules >/=  2 cm not described below (TR1): 0 Number of mixed cystic and solid nodules >/= 1.5 cm not described below (TR2): 0 _________________________________________________________ No normal thyroid parenchyma was localized. No regional cervical adenopathy. IMPRESSION: Nonvisualization of thyroid. In the absence of previous thyroidectomy, this would suggest marked parenchymal atrophy. The above is in keeping with the ACR TI-RADS recommendations - J Am Coll Radiol 2017;14:587-595. Electronically Signed   By: Corlis Leak  Hassell M.D.   On: 12/06/2020 15:53   CT HEAD CODE STROKE WO CONTRAST  Result Date: 12/05/2020  CLINICAL DATA:  Code stroke. Neuro deficit, acute, stroke suspected. EXAM: CT HEAD WITHOUT CONTRAST TECHNIQUE: Contiguous axial images were obtained from the base of the skull through the vertex without intravenous contrast. COMPARISON:  Report from head CT 11/21/1998 (images currently unavailable). FINDINGS: Brain: Cerebral volume is normal for age. 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 Background mild multifocal T2/FLAIR hyperintensity within the cerebral white matter, nonspecific but compatible with chronic small vessel ischemic disease. There is no acute intracranial hemorrhage. No demarcated cortical infarct. No extra-axial fluid collection. No evidence of an intracranial mass. No midline shift.  Vascular: No hyperdense vessel.  Atherosclerotic calcifications. Skull: Normal. Negative for fracture or focal lesion. Sinuses/Orbits: Visualized orbits show no acute finding. No significant paranasal sinus disease. ASPECTS [facility] Stroke Program Early CT Score) - Ganglionic level infarction (caudate, lentiform nuclei, internal capsule, insula, M1-M3 cortex): 7 - Supraganglionic infarction (M4-M6 cortex): 3 Total score (0-10 with 10 being normal): 10 (when discounting chronic infarcts). These results were communicated to Dr. Selina Cooley At 1:18 pmon 9/3/2022by text page via the [facility] messaging system. IMPRESSION: No evidence of acute intracranial abnormality. 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 Background mild chronic small vessel ischemic changes within the cerebral white matter. Electronically Signed   By: Jackey Loge D.O.   On: 12/05/2020 13:18   ECHOCARDIOGRAM LIMITED  Result Date: 12/08/2020    ECHOCARDIOGRAM LIMITED REPORT   Patient Name:   WESTYN KEATLEY Date of Exam: 12/08/2020 Medical Rec #:  [id number]    Height:       60.0 in Accession #:    [id number]   Weight:       141.5 lb Date of Birth:  [birth year]    BSA:          1.612 m Patient Age:    74 years     BP:           121/86 mmHg Patient Gender: F            HR:           66 bpm. Exam Location:  Inpatient Procedure: Limited Echo, Limited Color Doppler and Cardiac Doppler Indications:    Reassess LV mass  History:        Patient has prior history of Echocardiogram examinations, most                 recent 12/06/2020. Signs/Symptoms:Altered Mental Status; Risk                 Factors:Current Smoker.  Sonographer:    Delcie Roch RDCS Referring Phys: [id number] CALLIE E GOODRICH IMPRESSIONS  1. 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 It may be slightly smaller in size but is essentially unchanged from previous study. Kristeen Miss MD Electronically signed by Kristeen Miss MD Signature Date/Time: 12/08/2020/4:01:37 PM    Final       Subjective: She is feeling well, she is less confuse. She denies abdominal pain. Had BM day before  Discharge Exam: Vitals:   12/12/20 0459 12/12/20 0900  BP: 124/85 123/76  Pulse: 74 77  Resp: 14 14  Temp: 98.8 F (37.1 C) 98.3 F (36.8 C)  SpO2: 97% 96%     General: Pt is alert, awake, not in acute distress Cardiovascular: RRR, S1/S2 +, no rubs, no gallops Respiratory: CTA bilaterally, no wheezing, no rhonchi Abdominal: Soft, NT, ND, bowel sounds + Extremities: no edema, no cyanosis    The results of  significant diagnostics from this hospitalization (including imaging, microbiology, ancillary and laboratory) are listed below for reference.     Microbiology: Recent Results (from the past 240 hour(s))  CSF culture w Stat Gram Stain     Status: None   Collection Time: 12/05/20  3:50 PM   Specimen: CSF; Cerebrospinal Fluid  Result Value Ref Range Status   Specimen Description CSF  Final   Special Requests NONE  Final   Gram Stain   Final    WBC PRESENT, PREDOMINANTLY MONONUCLEAR NO ORGANISMS SEEN CYTOSPIN SMEAR    Culture   Final    NO GROWTH 3 DAYS Performed at [facility], [address], [place], [place] [address]    Report Status 12/09/2020 FINAL  Final  SARS CORONAVIRUS 2 (TAT 6-24 HRS) Nasopharyngeal Nasopharyngeal Swab     Status: None   Collection Time: 12/05/20  6:47 PM   Specimen: Nasopharyngeal Swab  Result Value Ref Range Status   SARS Coronavirus 2 NEGATIVE NEGATIVE Final    Comment: (NOTE) SARS-CoV-2 target nucleic acids are NOT DETECTED.  The SARS-CoV-2 RNA is generally detectable in upper and lower respiratory specimens during the acute phase of infection. Negative results do not preclude SARS-CoV-2 infection, do not rule out co-infections with other pathogens, and should not be used as the sole basis for treatment or other patient management decisions. Negative results must be combined with clinical observations, patient history,  and epidemiological information. The expected result is Negative.  Fact Sheet for Patients: HairSlick.no  Fact Sheet for Healthcare Providers: quierodirigir.com  This test is not yet approved or cleared by the [place] FDA and  has been authorized for detection and/or diagnosis of SARS-CoV-2 by FDA under an Emergency Use Authorization (EUA). This EUA will remain  in effect (meaning this test can be used) for the duration of the COVID-19 declaration under Se ction 564(b)(1) of the Act, 21 U.S.C. section 360bbb-3(b)(1), unless the authorization is terminated or revoked sooner.  Performed at [facility], [address] [place], [place] [address]   Culture, blood (Routine X 2) w Reflex to ID Panel     Status: None   Collection Time: 12/06/20  4:03 PM   Specimen: BLOOD RIGHT HAND  Result Value Ref Range Status   Specimen Description BLOOD RIGHT HAND  Final   Special Requests   Final    BOTTLES DRAWN AEROBIC ONLY Blood Culture adequate volume   Culture   Final    NO GROWTH 5 DAYS Performed at [facility], [address] [place], [place] [address]    Report Status 12/11/2020 FINAL  Final  Culture, blood (Routine X 2) w Reflex to ID Panel     Status: None   Collection Time: 12/06/20  4:12 PM   Specimen: BLOOD LEFT HAND  Result Value Ref Range Status   Specimen Description BLOOD LEFT HAND  Final   Special Requests   Final    BOTTLES DRAWN AEROBIC ONLY Blood Culture adequate volume   Culture   Final    NO GROWTH 5 DAYS Performed at [facility], [address], [place], [place] [address]    Report Status 12/11/2020 FINAL  Final  SARS CORONAVIRUS 2 (TAT 6-24 HRS) Nasopharyngeal Nasopharyngeal Swab     Status: None   Collection Time: 12/11/20  3:45 PM   Specimen: Nasopharyngeal Swab  Result Value Ref Range Status   SARS Coronavirus 2 NEGATIVE NEGATIVE Final    Comment: (NOTE)  SARS-CoV-2 target  nucleic acids are NOT DETECTED.  The SARS-CoV-2 RNA is generally detectable in upper and lower respiratory specimens during the acute phase of infection. Negative results do not preclude SARS-CoV-2 infection, do not rule out co-infections with other pathogens, and should not be used as the sole basis for treatment or other patient management decisions. Negative results must be combined with clinical observations, patient history, and epidemiological information. The expected result is Negative.  Fact Sheet for Patients: HairSlick.no  Fact Sheet for Healthcare Providers: quierodirigir.com  This test is not yet approved or cleared by the [place] FDA and  has been authorized for detection and/or diagnosis of SARS-CoV-2 by FDA under an Emergency Use Authorization (EUA). This EUA will remain  in effect (meaning this test can be used) for the duration of the COVID-19 declaration under Se ction 564(b)(1) of the Act, 21 U.S.C. section 360bbb-3(b)(1), unless the authorization is terminated or revoked sooner.  Performed at [facility], [address], [place], [place] [address]      Labs: BNP (last 3 results) No results for input(s): BNP in the last 8760 hours. Basic Metabolic Panel: Recent Labs  Lab 12/07/20 0222 12/08/20 0108 12/09/20 0345 12/09/20 0501 12/10/20 0803 12/11/20 0205  NA 128* 128* 111* 132* 135 134*  K 3.7 3.1* 2.4* 3.2* 3.7 3.6  CL 96* 99 87* 104 108 107  CO2 23 20* 18* 21* 20* 22  GLUCOSE 83 73 828* 85 79 89  BUN CREATININE 1.15* 1.13* 0.91 1.05* 1.02* 0.91  CALCIUM 9.5 8.9 6.8* 8.6* 9.0 8.9  MG 2.2  --   --   --   --   --   PHOS 3.0  --   --   --   --   --    Liver Function Tests: Recent Labs  Lab 12/05/20 1247 12/06/20 0138 12/07/20 0222 12/08/20 0108  AST 68* 90* 109* 86*  ALT ALKPHOS 66 72 76 68  BILITOT 0.4 0.5 0.6 0.6  PROT 7.2 7.5 7.5 6.3*   ALBUMIN 4.0 4.1 3.9 3.3*   No results for input(s): LIPASE, AMYLASE in the last 168 hours. Recent Labs  Lab 12/05/20 1952  AMMONIA 13   CBC: Recent Labs  Lab 12/05/20 1247 12/05/20 1252 12/06/20 0138 12/07/20 0222 12/08/20 0108 12/09/20 0345 12/09/20 0501 12/10/20 0803 12/11/20 0205  WBC 5.8  --  8.1   < > 6.1 4.4 5.5 5.7 6.2  NEUTROABS 3.9  --  7.1  --   --   --   --   --   --   HGB 10.6*   < > 11.8*   < > 10.6* 8.0* 9.9* 9.8* 8.9*  HCT 32.6*   < > 34.1*   < > 31.1* 24.0* 28.7* 30.0* 27.0*  MCV 97.3  --  93.2   < > 94.0 94.5 94.1 97.4 97.5  PLT 201  --  174   < > 165 141* 147* 171 187   < > = values in this interval not displayed.   Cardiac Enzymes: No results for input(s): CKTOTAL, CKMB, CKMBINDEX, TROPONINI in the last 168 hours. BNP: Invalid input(s): POCBNP CBG: Recent Labs  Lab 12/05/20 1248 12/09/20 0451  GLUCAP 110* 94   D-Dimer No results for input(s): DDIMER in the last 72 hours. Hgb A1c No results for input(s): HGBA1C in the last 72 hours. Lipid Profile No results for input(s): CHOL, HDL,  LDLCALC, TRIG, CHOLHDL, LDLDIRECT in the last 72 hours. Thyroid function studies No results for input(s): TSH, T4TOTAL, T3FREE, THYROIDAB in the last 72 hours.  Invalid input(s): FREET3 Anemia work up No results for input(s): VITAMINB12, FOLATE, FERRITIN, TIBC, IRON, RETICCTPCT in the last 72 hours. Urinalysis    Component Value Date/Time   COLORURINE YELLOW 12/05/2020 1600   APPEARANCEUR HAZY (A) 12/05/2020 1600   LABSPEC 1.011 12/05/2020 1600   PHURINE 7.0 12/05/2020 1600   GLUCOSEU NEGATIVE 12/05/2020 1600   HGBUR SMALL (A) 12/05/2020 1600   BILIRUBINUR NEGATIVE 12/05/2020 1600   KETONESUR NEGATIVE 12/05/2020 1600   PROTEINUR NEGATIVE 12/05/2020 1600   UROBILINOGEN 0.2 12/16/2014 1255   NITRITE NEGATIVE 12/05/2020 1600   LEUKOCYTESUR NEGATIVE 12/05/2020 1600   Sepsis Labs Invalid input(s): PROCALCITONIN,  WBC,  LACTICIDVEN Microbiology Recent  Results (from the past 240 hour(s))  CSF culture w Stat Gram Stain     Status: None   Collection Time: 12/05/20  3:50 PM   Specimen: CSF; Cerebrospinal Fluid  Result Value Ref Range Status   Specimen Description CSF  Final   Special Requests NONE  Final   Gram Stain   Final    WBC PRESENT, PREDOMINANTLY MONONUCLEAR NO ORGANISMS SEEN CYTOSPIN SMEAR    Culture   Final    NO GROWTH 3 DAYS Performed at [facility], [address] [place], [place] [address]    Report Status 12/09/2020 FINAL  Final  SARS CORONAVIRUS 2 (TAT 6-24 HRS) Nasopharyngeal Nasopharyngeal Swab     Status: None   Collection Time: 12/05/20  6:47 PM   Specimen: Nasopharyngeal Swab  Result Value Ref Range Status   SARS Coronavirus 2 NEGATIVE NEGATIVE Final    Comment: (NOTE) SARS-CoV-2 target nucleic acids are NOT DETECTED.  The SARS-CoV-2 RNA is generally detectable in upper and lower respiratory specimens during the acute phase of infection. Negative results do not preclude SARS-CoV-2 infection, do not rule out co-infections with other pathogens, and should not be used as the sole basis for treatment or other patient management decisions. Negative results must be combined with clinical observations, patient history, and epidemiological information. The expected result is Negative.  Fact Sheet for Patients: HairSlick.no  Fact Sheet for Healthcare Providers: quierodirigir.com  This test is not yet approved or cleared by the [place] FDA and  has been authorized for detection and/or diagnosis of SARS-CoV-2 by FDA under an Emergency Use Authorization (EUA). This EUA will remain  in effect (meaning this test can be used) for the duration of the COVID-19 declaration under Se ction 564(b)(1) of the Act, 21 U.S.C. section 360bbb-3(b)(1), unless the authorization is terminated or revoked sooner.  Performed at [facility], [address], [place], [place] [address]   Culture, blood (Routine X 2) w Reflex to ID Panel     Status: None   Collection Time: 12/06/20  4:03 PM   Specimen: BLOOD RIGHT HAND  Result Value Ref Range Status   Specimen Description BLOOD RIGHT HAND  Final   Special Requests   Final    BOTTLES DRAWN AEROBIC ONLY Blood Culture adequate volume   Culture   Final    NO GROWTH 5 DAYS Performed at [facility], [address] [place], [place] [address]    Report Status 12/11/2020 FINAL  Final  Culture, blood (Routine X 2) w Reflex to ID Panel     Status: None   Collection Time: 12/06/20  4:12 PM  Specimen: BLOOD LEFT HAND  Result Value Ref Range Status   Specimen Description BLOOD LEFT HAND  Final   Special Requests   Final    BOTTLES DRAWN AEROBIC ONLY Blood Culture adequate volume   Culture   Final    NO GROWTH 5 DAYS Performed at [facility], [address], [place], [place] [address]    Report Status 12/11/2020 FINAL  Final  SARS CORONAVIRUS 2 (TAT 6-24 HRS) Nasopharyngeal Nasopharyngeal Swab     Status: None   Collection Time: 12/11/20  3:45 PM   Specimen: Nasopharyngeal Swab  Result Value Ref Range Status   SARS Coronavirus 2 NEGATIVE NEGATIVE Final    Comment: (NOTE) SARS-CoV-2 target nucleic acids are NOT DETECTED.  The SARS-CoV-2 RNA is generally detectable in upper and lower respiratory specimens during the acute phase of infection. Negative results do not preclude SARS-CoV-2 infection, do not rule out co-infections with other pathogens, and should not be used as the sole basis for treatment or other patient management decisions. Negative results must be combined with clinical observations, patient history, and epidemiological information. The expected result is Negative.  Fact Sheet for Patients: HairSlick.no  Fact Sheet for Healthcare Providers: quierodirigir.com  This test is not yet approved or cleared by the  [place] FDA and  has been authorized for detection and/or diagnosis of SARS-CoV-2 by FDA under an Emergency Use Authorization (EUA). This EUA will remain  in effect (meaning this test can be used) for the duration of the COVID-19 declaration under Se ction 564(b)(1) of the Act, 21 U.S.C. section 360bbb-3(b)(1), unless the authorization is terminated or revoked sooner.  Performed at [facility], [address] [place], [place] [address]      Time coordinating discharge: 40 minutes  SIGNED:   Alba Cory, MD  [facility]

## 2020-12-12 NOTE — Plan of Care (Signed)
  Problem: Safety: Goal: Non-violent Restraint(s) Outcome: Adequate for Discharge   Problem: Education: Goal: Knowledge of disease or condition will improve Outcome: Adequate for Discharge Goal: Knowledge of secondary prevention will improve Outcome: Adequate for Discharge Goal: Knowledge of patient specific risk factors addressed and post discharge goals established will improve Outcome: Adequate for Discharge   Problem: Coping: Goal: Will verbalize positive feelings about self Outcome: Adequate for Discharge Goal: Will identify appropriate support needs Outcome: Adequate for Discharge   Problem: Health Behavior/Discharge Planning: Goal: Ability to manage health-related needs will improve Outcome: Adequate for Discharge   Problem: Self-Care: Goal: Ability to participate in self-care as condition permits will improve Outcome: Adequate for Discharge Goal: Verbalization of feelings and concerns over difficulty with self-care will improve Outcome: Adequate for Discharge Goal: Ability to communicate needs accurately will improve Outcome: Adequate for Discharge   Problem: Nutrition: Goal: Risk of aspiration will decrease Outcome: Adequate for Discharge Goal: Dietary intake will improve Outcome: Adequate for Discharge   Problem: Intracerebral Hemorrhage Tissue Perfusion: Goal: Complications of Intracerebral Hemorrhage will be minimized Outcome: Adequate for Discharge   Problem: Ischemic Stroke/TIA Tissue Perfusion: Goal: Complications of ischemic stroke/TIA will be minimized Outcome: Adequate for Discharge   Problem: Spontaneous Subarachnoid Hemorrhage Tissue Perfusion: Goal: Complications of Spontaneous Subarachnoid Hemorrhage will be minimized Outcome: Adequate for Discharge

## 2020-12-12 NOTE — Plan of Care (Deleted)
Problem: Safety: Goal: Non-violent Restraint(s) 12/12/2020 1818 by Merrilee Seashore, RN Outcome: Adequate for Discharge 12/12/2020 1817 by Merrilee Seashore, RN Outcome: Adequate for Discharge   Problem: Coping: Goal: Will verbalize positive feelings about self 12/12/2020 1818 by Merrilee Seashore, RN Outcome: Adequate for Discharge 12/12/2020 1817 by Merrilee Seashore, RN Outcome: Adequate for Discharge Goal: Will identify appropriate support needs 12/12/2020 1818 by Merrilee Seashore, RN Outcome: Adequate for Discharge 12/12/2020 1817 by Merrilee Seashore, RN Outcome: Adequate for Discharge   Problem: Health Behavior/Discharge Planning: Goal: Ability to manage health-related needs will improve 12/12/2020 1818 by Merrilee Seashore, RN Outcome: Adequate for Discharge 12/12/2020 1817 by Merrilee Seashore, RN Outcome: Adequate for Discharge   Problem: Self-Care: Goal: Ability to participate in self-care as condition permits will improve 12/12/2020 1818 by Merrilee Seashore, RN Outcome: Adequate for Discharge 12/12/2020 1817 by Merrilee Seashore, RN Outcome: Adequate for Discharge Goal: Verbalization of feelings and concerns over difficulty with self-care will improve 12/12/2020 1818 by Merrilee Seashore, RN Outcome: Adequate for Discharge 12/12/2020 1817 by Merrilee Seashore, RN Outcome: Adequate for Discharge Goal: Ability to communicate needs accurately will improve 12/12/2020 1818 by Merrilee Seashore, RN Outcome: Adequate for Discharge 12/12/2020 1817 by Merrilee Seashore, RN Outcome: Adequate for Discharge   Problem: Nutrition: Goal: Risk of aspiration will decrease 12/12/2020 1818 by Merrilee Seashore, RN Outcome: Adequate for Discharge 12/12/2020 1817 by Merrilee Seashore, RN Outcome: Adequate for Discharge Goal: Dietary intake will improve 12/12/2020 1818 by Merrilee Seashore, RN Outcome: Adequate for Discharge 12/12/2020 1817 by Merrilee Seashore, RN Outcome: Adequate for Discharge    Problem: Intracerebral Hemorrhage Tissue Perfusion: Goal: Complications of Intracerebral Hemorrhage will be minimized 12/12/2020 1818 by Merrilee Seashore, RN Outcome: Adequate for Discharge 12/12/2020 1817 by Merrilee Seashore, RN Outcome: Adequate for Discharge   Problem: Ischemic Stroke/TIA Tissue Perfusion: Goal: Complications of ischemic stroke/TIA will be minimized 12/12/2020 1818 by Merrilee Seashore, RN Outcome: Adequate for Discharge 12/12/2020 1817 by Merrilee Seashore, RN Outcome: Adequate for Discharge   Problem: Spontaneous Subarachnoid Hemorrhage Tissue Perfusion: Goal: Complications of Spontaneous Subarachnoid Hemorrhage will be minimized 12/12/2020 1818 by Merrilee Seashore, RN Outcome: Adequate for Discharge 12/12/2020 1817 by Merrilee Seashore, RN Outcome: Adequate for Discharge   Problem: Education: Goal: Knowledge of General Education information will improve Description: Including pain rating scale, medication(s)/side effects and non-pharmacologic comfort measures 12/12/2020 1818 by Merrilee Seashore, RN Outcome: Adequate for Discharge 12/12/2020 1817 by Merrilee Seashore, RN Outcome: Adequate for Discharge   Problem: Health Behavior/Discharge Planning: Goal: Ability to manage health-related needs will improve 12/12/2020 1818 by Merrilee Seashore, RN Outcome: Adequate for Discharge 12/12/2020 1817 by Merrilee Seashore, RN Outcome: Adequate for Discharge   Problem: Clinical Measurements: Goal: Ability to maintain clinical measurements within normal limits will improve 12/12/2020 1818 by Merrilee Seashore, RN Outcome: Adequate for Discharge 12/12/2020 1817 by Merrilee Seashore, RN Outcome: Adequate for Discharge Goal: Will remain free from infection 12/12/2020 1818 by Merrilee Seashore, RN Outcome: Adequate for Discharge 12/12/2020 1817 by Merrilee Seashore, RN Outcome: Adequate for Discharge Goal: Diagnostic test results will improve 12/12/2020 1818 by Merrilee Seashore,  RN Outcome: Adequate for Discharge 12/12/2020 1817 by Merrilee Seashore, RN Outcome: Adequate for Discharge Goal: Respiratory complications will improve 12/12/2020 1818 by Merrilee Seashore, RN Outcome: Adequate for Discharge 12/12/2020 1817 by Merrilee Seashore, RN Outcome: Adequate for  Discharge Goal: Cardiovascular complication will be avoided 12/12/2020 1818 by Merrilee Seashore, RN Outcome: Adequate for Discharge 12/12/2020 1817 by Merrilee Seashore, RN Outcome: Adequate for Discharge   Problem: Activity: Goal: Risk for activity intolerance will decrease 12/12/2020 1818 by Merrilee Seashore, RN Outcome: Adequate for Discharge 12/12/2020 1817 by Merrilee Seashore, RN Outcome: Adequate for Discharge   Problem: Nutrition: Goal: Adequate nutrition will be maintained 12/12/2020 1818 by Merrilee Seashore, RN Outcome: Adequate for Discharge 12/12/2020 1817 by Merrilee Seashore, RN Outcome: Adequate for Discharge   Problem: Coping: Goal: Level of anxiety will decrease 12/12/2020 1818 by Merrilee Seashore, RN Outcome: Adequate for Discharge 12/12/2020 1817 by Merrilee Seashore, RN Outcome: Adequate for Discharge   Problem: Elimination: Goal: Will not experience complications related to bowel motility 12/12/2020 1818 by Merrilee Seashore, RN Outcome: Adequate for Discharge 12/12/2020 1817 by Merrilee Seashore, RN Outcome: Adequate for Discharge Goal: Will not experience complications related to urinary retention 12/12/2020 1818 by Merrilee Seashore, RN Outcome: Adequate for Discharge 12/12/2020 1817 by Merrilee Seashore, RN Outcome: Adequate for Discharge   Problem: Pain Managment: Goal: General experience of comfort will improve 12/12/2020 1818 by Merrilee Seashore, RN Outcome: Adequate for Discharge 12/12/2020 1817 by Merrilee Seashore, RN Outcome: Adequate for Discharge   Problem: Safety: Goal: Ability to remain free from injury will improve 12/12/2020 1818 by Merrilee Seashore, RN Outcome:  Adequate for Discharge 12/12/2020 1817 by Merrilee Seashore, RN Outcome: Adequate for Discharge   Problem: Skin Integrity: Goal: Risk for impaired skin integrity will decrease 12/12/2020 1818 by Merrilee Seashore, RN Outcome: Adequate for Discharge 12/12/2020 1817 by Merrilee Seashore, RN Outcome: Adequate for Discharge

## 2020-12-12 NOTE — Progress Notes (Signed)
Handoff report given to receiving RN at [facility] facility, RN informed [facility] has been contacted for transport.

## 2020-12-14 NOTE — Anesthesia Postprocedure Evaluation (Signed)
Anesthesia Post Note  Patient: Jane Navarro  Procedure(s) Performed: TRANSESOPHAGEAL ECHOCARDIOGRAM (TEE)     Patient location during evaluation: Endoscopy Anesthesia Type: MAC Level of consciousness: awake and alert Pain management: pain level controlled Vital Signs Assessment: post-procedure vital signs reviewed and stable Respiratory status: spontaneous breathing, nonlabored ventilation, respiratory function stable and patient connected to nasal cannula oxygen Cardiovascular status: stable and blood pressure returned to baseline Postop Assessment: no apparent nausea or vomiting Anesthetic complications: no   No notable events documented.  Last Vitals:  Vitals:   12/12/20 1247 12/12/20 1619  BP: 117/77 123/85  Pulse: 79 73  Resp: 15 16  Temp: 36.9 C 37 C  SpO2: 95% 96%    Last Pain:  Vitals:   12/12/20 1619  TempSrc: Oral  PainSc: 0-No pain                 Amar Keenum

## 2020-12-28 ENCOUNTER — Telehealth: Payer: Self-pay | Admitting: Internal Medicine

## 2020-12-28 ENCOUNTER — Encounter (HOSPITAL_BASED_OUTPATIENT_CLINIC_OR_DEPARTMENT_OTHER): Payer: Self-pay | Admitting: *Deleted

## 2020-12-28 ENCOUNTER — Emergency Department (HOSPITAL_COMMUNITY): Payer: Medicare HMO

## 2020-12-28 ENCOUNTER — Emergency Department (HOSPITAL_BASED_OUTPATIENT_CLINIC_OR_DEPARTMENT_OTHER): Payer: Medicare HMO

## 2020-12-28 ENCOUNTER — Inpatient Hospital Stay (HOSPITAL_BASED_OUTPATIENT_CLINIC_OR_DEPARTMENT_OTHER)
Admission: EM | Admit: 2020-12-28 | Discharge: 2021-01-11 | DRG: 215 | Disposition: A | Discharge status: Rehabilitation Facility | Payer: Medicare HMO | Source: Ambulatory Visit | Attending: Internal Medicine | Admitting: Internal Medicine

## 2020-12-28 ENCOUNTER — Other Ambulatory Visit: Payer: Self-pay

## 2020-12-28 DIAGNOSIS — R040 Epistaxis: Secondary | ICD-10-CM | POA: Diagnosis not present

## 2020-12-28 DIAGNOSIS — K5900 Constipation, unspecified: Secondary | ICD-10-CM | POA: Diagnosis present

## 2020-12-28 DIAGNOSIS — I255 Ischemic cardiomyopathy: Secondary | ICD-10-CM | POA: Diagnosis present

## 2020-12-28 DIAGNOSIS — Z880 Allergy status to penicillin: Secondary | ICD-10-CM

## 2020-12-28 DIAGNOSIS — Z8673 Personal history of transient ischemic attack (TIA), and cerebral infarction without residual deficits: Secondary | ICD-10-CM

## 2020-12-28 DIAGNOSIS — I509 Heart failure, unspecified: Secondary | ICD-10-CM

## 2020-12-28 DIAGNOSIS — I214 Non-ST elevation (NSTEMI) myocardial infarction: Principal | ICD-10-CM

## 2020-12-28 DIAGNOSIS — I252 Old myocardial infarction: Secondary | ICD-10-CM

## 2020-12-28 DIAGNOSIS — Z7982 Long term (current) use of aspirin: Secondary | ICD-10-CM

## 2020-12-28 DIAGNOSIS — I2510 Atherosclerotic heart disease of native coronary artery without angina pectoris: Secondary | ICD-10-CM | POA: Diagnosis present

## 2020-12-28 DIAGNOSIS — E785 Hyperlipidemia, unspecified: Secondary | ICD-10-CM | POA: Diagnosis present

## 2020-12-28 DIAGNOSIS — R6510 Systemic inflammatory response syndrome (SIRS) of non-infectious origin without acute organ dysfunction: Secondary | ICD-10-CM | POA: Diagnosis present

## 2020-12-28 DIAGNOSIS — R4182 Altered mental status, unspecified: Secondary | ICD-10-CM

## 2020-12-28 DIAGNOSIS — I130 Hypertensive heart and chronic kidney disease with heart failure and stage 1 through stage 4 chronic kidney disease, or unspecified chronic kidney disease: Secondary | ICD-10-CM | POA: Diagnosis present

## 2020-12-28 DIAGNOSIS — Z6829 Body mass index (BMI) 29.0-29.9, adult: Secondary | ICD-10-CM

## 2020-12-28 DIAGNOSIS — D6804 Acquired von Willebrand disease: Secondary | ICD-10-CM | POA: Diagnosis present

## 2020-12-28 DIAGNOSIS — E876 Hypokalemia: Secondary | ICD-10-CM | POA: Diagnosis present

## 2020-12-28 DIAGNOSIS — N390 Urinary tract infection, site not specified: Secondary | ICD-10-CM | POA: Diagnosis present

## 2020-12-28 DIAGNOSIS — D6959 Other secondary thrombocytopenia: Secondary | ICD-10-CM | POA: Diagnosis not present

## 2020-12-28 DIAGNOSIS — Z20822 Contact with and (suspected) exposure to covid-19: Secondary | ICD-10-CM | POA: Diagnosis present

## 2020-12-28 DIAGNOSIS — R7401 Elevation of levels of liver transaminase levels: Secondary | ICD-10-CM

## 2020-12-28 DIAGNOSIS — R55 Syncope and collapse: Secondary | ICD-10-CM | POA: Diagnosis not present

## 2020-12-28 DIAGNOSIS — N3000 Acute cystitis without hematuria: Secondary | ICD-10-CM | POA: Diagnosis present

## 2020-12-28 DIAGNOSIS — E872 Acidosis, unspecified: Secondary | ICD-10-CM | POA: Diagnosis present

## 2020-12-28 DIAGNOSIS — Z881 Allergy status to other antibiotic agents status: Secondary | ICD-10-CM

## 2020-12-28 DIAGNOSIS — N1831 Chronic kidney disease, stage 3a: Secondary | ICD-10-CM | POA: Diagnosis present

## 2020-12-28 DIAGNOSIS — D539 Nutritional anemia, unspecified: Secondary | ICD-10-CM | POA: Diagnosis present

## 2020-12-28 DIAGNOSIS — R570 Cardiogenic shock: Secondary | ICD-10-CM | POA: Diagnosis present

## 2020-12-28 DIAGNOSIS — R578 Other shock: Secondary | ICD-10-CM | POA: Diagnosis not present

## 2020-12-28 DIAGNOSIS — E871 Hypo-osmolality and hyponatremia: Secondary | ICD-10-CM | POA: Diagnosis present

## 2020-12-28 DIAGNOSIS — G9341 Metabolic encephalopathy: Secondary | ICD-10-CM | POA: Diagnosis present

## 2020-12-28 DIAGNOSIS — Z7902 Long term (current) use of antithrombotics/antiplatelets: Secondary | ICD-10-CM

## 2020-12-28 DIAGNOSIS — Z79890 Hormone replacement therapy (postmenopausal): Secondary | ICD-10-CM

## 2020-12-28 DIAGNOSIS — I447 Left bundle-branch block, unspecified: Secondary | ICD-10-CM | POA: Diagnosis present

## 2020-12-28 DIAGNOSIS — D72829 Elevated white blood cell count, unspecified: Secondary | ICD-10-CM | POA: Diagnosis present

## 2020-12-28 DIAGNOSIS — N179 Acute kidney failure, unspecified: Secondary | ICD-10-CM | POA: Diagnosis present

## 2020-12-28 DIAGNOSIS — E039 Hypothyroidism, unspecified: Secondary | ICD-10-CM | POA: Diagnosis present

## 2020-12-28 DIAGNOSIS — R531 Weakness: Secondary | ICD-10-CM | POA: Diagnosis present

## 2020-12-28 DIAGNOSIS — K7200 Acute and subacute hepatic failure without coma: Secondary | ICD-10-CM | POA: Diagnosis present

## 2020-12-28 DIAGNOSIS — S301XXA Contusion of abdominal wall, initial encounter: Secondary | ICD-10-CM | POA: Diagnosis not present

## 2020-12-28 DIAGNOSIS — D62 Acute posthemorrhagic anemia: Secondary | ICD-10-CM | POA: Diagnosis not present

## 2020-12-28 DIAGNOSIS — I5021 Acute systolic (congestive) heart failure: Secondary | ICD-10-CM | POA: Diagnosis not present

## 2020-12-28 DIAGNOSIS — Z955 Presence of coronary angioplasty implant and graft: Secondary | ICD-10-CM

## 2020-12-28 DIAGNOSIS — I959 Hypotension, unspecified: Secondary | ICD-10-CM | POA: Diagnosis not present

## 2020-12-28 DIAGNOSIS — Z87891 Personal history of nicotine dependence: Secondary | ICD-10-CM

## 2020-12-28 DIAGNOSIS — Z90710 Acquired absence of both cervix and uterus: Secondary | ICD-10-CM

## 2020-12-28 DIAGNOSIS — Z79899 Other long term (current) drug therapy: Secondary | ICD-10-CM

## 2020-12-28 HISTORY — DX: Hypothyroidism, unspecified: E03.9

## 2020-12-28 HISTORY — DX: Hyperlipidemia, unspecified: E78.5

## 2020-12-28 HISTORY — DX: Cerebral infarction, unspecified: I63.9

## 2020-12-28 LAB — CBC
HCT: 27.2 % — ABNORMAL LOW (ref 36.0–46.0)
Hemoglobin: 8.7 g/dL — ABNORMAL LOW (ref 12.0–15.0)
MCH: 32.3 pg (ref 26.0–34.0)
MCHC: 32 g/dL (ref 30.0–36.0)
MCV: 101.1 fL — ABNORMAL HIGH (ref 80.0–100.0)
Platelets: 360 10*3/uL (ref 150–400)
RBC: 2.69 MIL/uL — ABNORMAL LOW (ref 3.87–5.11)
RDW: 16.7 % — ABNORMAL HIGH (ref 11.5–15.5)
WBC: 13.6 10*3/uL — ABNORMAL HIGH (ref 4.0–10.5)
nRBC: 3 % — ABNORMAL HIGH (ref 0.0–0.2)

## 2020-12-28 LAB — BASIC METABOLIC PANEL
Anion gap: 14 (ref 5–15)
BUN: 64 mg/dL — ABNORMAL HIGH (ref 8–23)
CO2: 19 mmol/L — ABNORMAL LOW (ref 22–32)
Calcium: 9.4 mg/dL (ref 8.9–10.3)
Chloride: 104 mmol/L (ref 98–111)
Creatinine, Ser: 1.48 mg/dL — ABNORMAL HIGH (ref 0.44–1.00)
GFR, Estimated: 37 mL/min — ABNORMAL LOW (ref >60)
Glucose, Bld: 149 mg/dL — ABNORMAL HIGH (ref 70–99)
Potassium: 3.8 mmol/L (ref 3.5–5.1)
Sodium: 137 mmol/L (ref 135–145)

## 2020-12-28 NOTE — Telephone Encounter (Signed)
Called Keri with [facility]  Requesting orders for nurse to go to patient's home 1 time weekly for 6 weeks for observation/assessment and medication education.   Daughter is going out of town the end of the week and patient will be alone.   Patient does not have PCP currently.     Advised would route to MD to review.    Patient was to follow up with Neuro as well-appt 10/31 Appt with Dr. Jacques Navy 10/20

## 2020-12-28 NOTE — Telephone Encounter (Signed)
Keri from [facility] verbal orders for home health nursing. Phone: [phone]

## 2020-12-28 NOTE — ED Triage Notes (Addendum)
Daughter reports pt returned home Friday from rehab after adm. To hospital DX CVA , pt is unable to walk without assistance and has declined in care in 3 days that she has been home, decreased po intake, unable to care for herself

## 2020-12-28 NOTE — ED Notes (Signed)
In and out cath performed; urine specimen obtained and sent to lab; pt tolerated well

## 2020-12-29 ENCOUNTER — Inpatient Hospital Stay (HOSPITAL_COMMUNITY): Payer: Medicare HMO

## 2020-12-29 ENCOUNTER — Inpatient Hospital Stay (HOSPITAL_COMMUNITY)
Admission: EM | Disposition: A | Discharge status: Rehabilitation Facility | Payer: Self-pay | Source: Ambulatory Visit | Attending: Internal Medicine

## 2020-12-29 DIAGNOSIS — N179 Acute kidney failure, unspecified: Secondary | ICD-10-CM

## 2020-12-29 DIAGNOSIS — R4182 Altered mental status, unspecified: Secondary | ICD-10-CM | POA: Diagnosis present

## 2020-12-29 DIAGNOSIS — D72829 Elevated white blood cell count, unspecified: Secondary | ICD-10-CM | POA: Diagnosis not present

## 2020-12-29 DIAGNOSIS — Z95811 Presence of heart assist device: Secondary | ICD-10-CM | POA: Diagnosis not present

## 2020-12-29 DIAGNOSIS — N3000 Acute cystitis without hematuria: Secondary | ICD-10-CM | POA: Diagnosis present

## 2020-12-29 DIAGNOSIS — D539 Nutritional anemia, unspecified: Secondary | ICD-10-CM

## 2020-12-29 DIAGNOSIS — R579 Shock, unspecified: Secondary | ICD-10-CM | POA: Diagnosis not present

## 2020-12-29 DIAGNOSIS — I959 Hypotension, unspecified: Secondary | ICD-10-CM | POA: Diagnosis not present

## 2020-12-29 DIAGNOSIS — I5021 Acute systolic (congestive) heart failure: Secondary | ICD-10-CM | POA: Diagnosis not present

## 2020-12-29 DIAGNOSIS — G8918 Other acute postprocedural pain: Secondary | ICD-10-CM | POA: Diagnosis not present

## 2020-12-29 DIAGNOSIS — N390 Urinary tract infection, site not specified: Secondary | ICD-10-CM | POA: Diagnosis present

## 2020-12-29 DIAGNOSIS — R748 Abnormal levels of other serum enzymes: Secondary | ICD-10-CM | POA: Diagnosis not present

## 2020-12-29 DIAGNOSIS — R578 Other shock: Secondary | ICD-10-CM | POA: Diagnosis not present

## 2020-12-29 DIAGNOSIS — I255 Ischemic cardiomyopathy: Secondary | ICD-10-CM | POA: Diagnosis present

## 2020-12-29 DIAGNOSIS — I2510 Atherosclerotic heart disease of native coronary artery without angina pectoris: Secondary | ICD-10-CM

## 2020-12-29 DIAGNOSIS — R570 Cardiogenic shock: Secondary | ICD-10-CM | POA: Diagnosis present

## 2020-12-29 DIAGNOSIS — R5381 Other malaise: Secondary | ICD-10-CM | POA: Diagnosis not present

## 2020-12-29 DIAGNOSIS — G9341 Metabolic encephalopathy: Secondary | ICD-10-CM

## 2020-12-29 DIAGNOSIS — R6510 Systemic inflammatory response syndrome (SIRS) of non-infectious origin without acute organ dysfunction: Secondary | ICD-10-CM

## 2020-12-29 DIAGNOSIS — I130 Hypertensive heart and chronic kidney disease with heart failure and stage 1 through stage 4 chronic kidney disease, or unspecified chronic kidney disease: Secondary | ICD-10-CM | POA: Diagnosis present

## 2020-12-29 DIAGNOSIS — R7989 Other specified abnormal findings of blood chemistry: Secondary | ICD-10-CM

## 2020-12-29 DIAGNOSIS — E871 Hypo-osmolality and hyponatremia: Secondary | ICD-10-CM | POA: Diagnosis present

## 2020-12-29 DIAGNOSIS — I214 Non-ST elevation (NSTEMI) myocardial infarction: Secondary | ICD-10-CM | POA: Diagnosis present

## 2020-12-29 DIAGNOSIS — R778 Other specified abnormalities of plasma proteins: Secondary | ICD-10-CM | POA: Diagnosis not present

## 2020-12-29 DIAGNOSIS — D6959 Other secondary thrombocytopenia: Secondary | ICD-10-CM | POA: Diagnosis not present

## 2020-12-29 DIAGNOSIS — D62 Acute posthemorrhagic anemia: Secondary | ICD-10-CM | POA: Diagnosis not present

## 2020-12-29 DIAGNOSIS — R7401 Elevation of levels of liver transaminase levels: Secondary | ICD-10-CM | POA: Diagnosis not present

## 2020-12-29 DIAGNOSIS — I252 Old myocardial infarction: Secondary | ICD-10-CM | POA: Diagnosis not present

## 2020-12-29 DIAGNOSIS — S301XXA Contusion of abdominal wall, initial encounter: Secondary | ICD-10-CM | POA: Diagnosis not present

## 2020-12-29 DIAGNOSIS — E872 Acidosis, unspecified: Secondary | ICD-10-CM | POA: Diagnosis present

## 2020-12-29 DIAGNOSIS — D6804 Acquired von Willebrand disease: Secondary | ICD-10-CM | POA: Diagnosis present

## 2020-12-29 DIAGNOSIS — N1831 Chronic kidney disease, stage 3a: Secondary | ICD-10-CM | POA: Diagnosis present

## 2020-12-29 DIAGNOSIS — E876 Hypokalemia: Secondary | ICD-10-CM | POA: Diagnosis not present

## 2020-12-29 DIAGNOSIS — K7200 Acute and subacute hepatic failure without coma: Secondary | ICD-10-CM | POA: Diagnosis present

## 2020-12-29 DIAGNOSIS — I951 Orthostatic hypotension: Secondary | ICD-10-CM | POA: Diagnosis not present

## 2020-12-29 DIAGNOSIS — R55 Syncope and collapse: Secondary | ICD-10-CM | POA: Diagnosis not present

## 2020-12-29 DIAGNOSIS — E039 Hypothyroidism, unspecified: Secondary | ICD-10-CM | POA: Diagnosis present

## 2020-12-29 DIAGNOSIS — R531 Weakness: Secondary | ICD-10-CM

## 2020-12-29 DIAGNOSIS — Z20822 Contact with and (suspected) exposure to covid-19: Secondary | ICD-10-CM | POA: Diagnosis present

## 2020-12-29 HISTORY — PX: CENTRAL LINE INSERTION: CATH118232

## 2020-12-29 HISTORY — PX: VENTRICULAR ASSIST DEVICE INSERTION: CATH118273

## 2020-12-29 HISTORY — PX: RIGHT/LEFT HEART CATH AND CORONARY ANGIOGRAPHY: CATH118266

## 2020-12-29 LAB — CBC WITH DIFFERENTIAL/PLATELET
Abs Immature Granulocytes: 0 10*3/uL (ref 0.00–0.07)
Basophils Absolute: 0 10*3/uL (ref 0.0–0.1)
Basophils Relative: 0 %
Eosinophils Absolute: 0 10*3/uL (ref 0.0–0.5)
Eosinophils Relative: 0 %
HCT: 23.8 % — ABNORMAL LOW (ref 36.0–46.0)
Hemoglobin: 7.4 g/dL — ABNORMAL LOW (ref 12.0–15.0)
Lymphocytes Relative: 11 %
Lymphs Abs: 1.3 10*3/uL (ref 0.7–4.0)
MCH: 31.8 pg (ref 26.0–34.0)
MCHC: 31.1 g/dL (ref 30.0–36.0)
MCV: 102.1 fL — ABNORMAL HIGH (ref 80.0–100.0)
Monocytes Absolute: 0.7 10*3/uL (ref 0.1–1.0)
Monocytes Relative: 6 %
Neutro Abs: 10 10*3/uL — ABNORMAL HIGH (ref 1.7–7.7)
Neutrophils Relative %: 83 %
Platelets: 270 10*3/uL (ref 150–400)
RBC: 2.33 MIL/uL — ABNORMAL LOW (ref 3.87–5.11)
RDW: 16.7 % — ABNORMAL HIGH (ref 11.5–15.5)
WBC: 12 10*3/uL — ABNORMAL HIGH (ref 4.0–10.5)
nRBC: 11 /100 WBC — ABNORMAL HIGH
nRBC: 6.9 % — ABNORMAL HIGH (ref 0.0–0.2)

## 2020-12-29 LAB — BRAIN NATRIURETIC PEPTIDE: B Natriuretic Peptide: >4500 pg/mL — ABNORMAL HIGH (ref 0.0–100.0)

## 2020-12-29 LAB — POCT I-STAT EG7
Acid-base deficit: 2 mmol/L (ref 0.0–2.0)
Acid-base deficit: 4 mmol/L — ABNORMAL HIGH (ref 0.0–2.0)
Bicarbonate: 19.6 mmol/L — ABNORMAL LOW (ref 20.0–28.0)
Bicarbonate: 22.3 mmol/L (ref 20.0–28.0)
Calcium, Ion: 0.95 mmol/L — ABNORMAL LOW (ref 1.15–1.40)
Calcium, Ion: 1.16 mmol/L (ref 1.15–1.40)
HCT: 19 % — ABNORMAL LOW (ref 36.0–46.0)
HCT: 22 % — ABNORMAL LOW (ref 36.0–46.0)
Hemoglobin: 6.5 g/dL — CL (ref 12.0–15.0)
Hemoglobin: 7.5 g/dL — ABNORMAL LOW (ref 12.0–15.0)
O2 Saturation: 39 %
O2 Saturation: 56 %
Potassium: 3.2 mmol/L — ABNORMAL LOW (ref 3.5–5.1)
Potassium: 3.7 mmol/L (ref 3.5–5.1)
Sodium: 143 mmol/L (ref 135–145)
Sodium: 148 mmol/L — ABNORMAL HIGH (ref 135–145)
TCO2: 21 mmol/L — ABNORMAL LOW (ref 22–32)
TCO2: 23 mmol/L (ref 22–32)
pCO2, Ven: 30.3 mmHg — ABNORMAL LOW (ref 44.0–60.0)
pCO2, Ven: 34 mmHg — ABNORMAL LOW (ref 44.0–60.0)
pH, Ven: 7.419 (ref 7.250–7.430)
pH, Ven: 7.424 (ref 7.250–7.430)
pO2, Ven: 22 mmHg — CL (ref 32.0–45.0)
pO2, Ven: 29 mmHg — CL (ref 32.0–45.0)

## 2020-12-29 LAB — POCT I-STAT 7, (LYTES, BLD GAS, ICA,H+H)
Acid-base deficit: 2 mmol/L (ref 0.0–2.0)
Acid-base deficit: 6 mmol/L — ABNORMAL HIGH (ref 0.0–2.0)
Bicarbonate: 20.4 mmol/L (ref 20.0–28.0)
Bicarbonate: 18.6 mmol/L — ABNORMAL LOW (ref 20.0–28.0)
Calcium, Ion: 1.15 mmol/L (ref 1.15–1.40)
Calcium, Ion: 0.99 mmol/L — ABNORMAL LOW (ref 1.15–1.40)
HCT: 22 % — ABNORMAL LOW (ref 36.0–46.0)
HCT: 17 % — ABNORMAL LOW (ref 36.0–46.0)
Hemoglobin: 7.5 g/dL — ABNORMAL LOW (ref 12.0–15.0)
Hemoglobin: 5.8 g/dL — CL (ref 12.0–15.0)
O2 Saturation: 99 %
O2 Saturation: 96 %
Potassium: 3.7 mmol/L (ref 3.5–5.1)
Potassium: 3.3 mmol/L — ABNORMAL LOW (ref 3.5–5.1)
Sodium: 142 mmol/L (ref 135–145)
Sodium: 147 mmol/L — ABNORMAL HIGH (ref 135–145)
TCO2: 21 mmol/L — ABNORMAL LOW (ref 22–32)
TCO2: 19 mmol/L — ABNORMAL LOW (ref 22–32)
pCO2 arterial: 26.4 mmHg — ABNORMAL LOW (ref 32.0–48.0)
pCO2 arterial: 29.4 mmHg — ABNORMAL LOW (ref 32.0–48.0)
pH, Arterial: 7.496 — ABNORMAL HIGH (ref 7.350–7.450)
pH, Arterial: 7.409 (ref 7.350–7.450)
pO2, Arterial: 116 mmHg — ABNORMAL HIGH (ref 83.0–108.0)
pO2, Arterial: 76 mmHg — ABNORMAL LOW (ref 83.0–108.0)

## 2020-12-29 LAB — URINALYSIS, MICROSCOPIC (REFLEX)

## 2020-12-29 LAB — POCT ACTIVATED CLOTTING TIME
Activated Clotting Time: 155 seconds
Activated Clotting Time: 155 seconds
Activated Clotting Time: 190 seconds
Activated Clotting Time: 196 seconds
Activated Clotting Time: 231 seconds
Activated Clotting Time: 248 seconds
Activated Clotting Time: 150 seconds

## 2020-12-29 LAB — ECHOCARDIOGRAM COMPLETE
Area-P 1/2: 5.7 cm2
Height: 60 in
MV M vel: 4.13 m/s
MV Peak grad: 68.2 mmHg
Radius: 0.6 cm
S' Lateral: 3.7 cm
Weight: 2256 oz

## 2020-12-29 LAB — COOXEMETRY PANEL
Carboxyhemoglobin: 1.6 % — ABNORMAL HIGH (ref 0.5–1.5)
Carboxyhemoglobin: 1.9 % — ABNORMAL HIGH (ref 0.5–1.5)
Methemoglobin: 1.2 % (ref 0.0–1.5)
Methemoglobin: 0.9 % (ref 0.0–1.5)
O2 Saturation: 40.2 %
O2 Saturation: 60.4 %
Total hemoglobin: 5.9 g/dL — CL (ref 12.0–16.0)
Total hemoglobin: 9.5 g/dL — ABNORMAL LOW (ref 12.0–16.0)

## 2020-12-29 LAB — URINALYSIS, ROUTINE W REFLEX MICROSCOPIC
Bilirubin Urine: NEGATIVE
Glucose, UA: NEGATIVE mg/dL
Hgb urine dipstick: NEGATIVE
Ketones, ur: 15 mg/dL — AB
Nitrite: POSITIVE — AB
Protein, ur: 30 mg/dL — AB
Specific Gravity, Urine: 1.025 (ref 1.005–1.030)
pH: 6 (ref 5.0–8.0)

## 2020-12-29 LAB — PREPARE RBC (CROSSMATCH)

## 2020-12-29 LAB — LACTIC ACID, PLASMA
Lactic Acid, Venous: 2.5 mmol/L (ref 0.5–1.9)
Lactic Acid, Venous: 1.8 mmol/L (ref 0.5–1.9)

## 2020-12-29 LAB — ECHOCARDIOGRAM LIMITED
Height: 60 in
Weight: 2256 oz

## 2020-12-29 LAB — CBC
HCT: 20.8 % — ABNORMAL LOW (ref 36.0–46.0)
Hemoglobin: 6.5 g/dL — CL (ref 12.0–15.0)
MCH: 31.7 pg (ref 26.0–34.0)
MCHC: 31.3 g/dL (ref 30.0–36.0)
MCV: 101.5 fL — ABNORMAL HIGH (ref 80.0–100.0)
Platelets: 243 10*3/uL (ref 150–400)
RBC: 2.05 MIL/uL — ABNORMAL LOW (ref 3.87–5.11)
RDW: 16.6 % — ABNORMAL HIGH (ref 11.5–15.5)
WBC: 10.4 10*3/uL (ref 4.0–10.5)
nRBC: 7.5 % — ABNORMAL HIGH (ref 0.0–0.2)

## 2020-12-29 LAB — TSH: TSH: 4.818 u[IU]/mL — ABNORMAL HIGH (ref 0.350–4.500)

## 2020-12-29 LAB — HEPATITIS PANEL, ACUTE
HCV Ab: NONREACTIVE
Hep A IgM: NONREACTIVE
Hep B C IgM: NONREACTIVE
Hepatitis B Surface Ag: NONREACTIVE

## 2020-12-29 LAB — TROPONIN I (HIGH SENSITIVITY)
Troponin I (High Sensitivity): 7477 ng/L (ref <18)
Troponin I (High Sensitivity): 7263 ng/L (ref <18)

## 2020-12-29 LAB — ABO/RH: ABO/RH(D): O POS

## 2020-12-29 LAB — HEPARIN LEVEL (UNFRACTIONATED): Heparin Unfractionated: 0.28 IU/mL — ABNORMAL LOW (ref 0.30–0.70)

## 2020-12-29 LAB — HEPATIC FUNCTION PANEL
ALT: 1270 U/L — ABNORMAL HIGH (ref 0–44)
AST: 2238 U/L — ABNORMAL HIGH (ref 15–41)
Albumin: 3.7 g/dL (ref 3.5–5.0)
Alkaline Phosphatase: 279 U/L — ABNORMAL HIGH (ref 38–126)
Bilirubin, Direct: 0.9 mg/dL — ABNORMAL HIGH (ref 0.0–0.2)
Indirect Bilirubin: 1 mg/dL — ABNORMAL HIGH (ref 0.3–0.9)
Total Bilirubin: 1.9 mg/dL — ABNORMAL HIGH (ref 0.3–1.2)
Total Protein: 6.7 g/dL (ref 6.5–8.1)

## 2020-12-29 LAB — PROCALCITONIN: Procalcitonin: 4.12 ng/mL

## 2020-12-29 LAB — LACTATE DEHYDROGENASE: LDH: 1593 U/L — ABNORMAL HIGH (ref 98–192)

## 2020-12-29 LAB — RESP PANEL BY RT-PCR (FLU A&B, COVID) ARPGX2
Influenza A by PCR: NEGATIVE
Influenza B by PCR: NEGATIVE
SARS Coronavirus 2 by RT PCR: NEGATIVE

## 2020-12-29 LAB — AMMONIA: Ammonia: 12 umol/L (ref 9–35)

## 2020-12-29 LAB — ETHANOL: Alcohol, Ethyl (B): <10 mg/dL (ref <10)

## 2020-12-29 LAB — BASIC METABOLIC PANEL
Anion gap: 15 (ref 5–15)
BUN: 72 mg/dL — ABNORMAL HIGH (ref 8–23)
CO2: 19 mmol/L — ABNORMAL LOW (ref 22–32)
Calcium: 9.1 mg/dL (ref 8.9–10.3)
Chloride: 105 mmol/L (ref 98–111)
Creatinine, Ser: 1.66 mg/dL — ABNORMAL HIGH (ref 0.44–1.00)
GFR, Estimated: 32 mL/min — ABNORMAL LOW (ref >60)
Glucose, Bld: 109 mg/dL — ABNORMAL HIGH (ref 70–99)
Potassium: 3.8 mmol/L (ref 3.5–5.1)
Sodium: 139 mmol/L (ref 135–145)

## 2020-12-29 LAB — ACETAMINOPHEN LEVEL: Acetaminophen (Tylenol), Serum: <10 ug/mL — ABNORMAL LOW (ref 10–30)

## 2020-12-29 LAB — T4, FREE: Free T4: 1.28 ng/dL — ABNORMAL HIGH (ref 0.61–1.12)

## 2020-12-29 LAB — MAGNESIUM: Magnesium: 2.7 mg/dL — ABNORMAL HIGH (ref 1.7–2.4)

## 2020-12-29 LAB — MRSA NEXT GEN BY PCR, NASAL: MRSA by PCR Next Gen: NOT DETECTED

## 2020-12-29 SURGERY — RIGHT/LEFT HEART CATH AND CORONARY ANGIOGRAPHY
Anesthesia: LOCAL

## 2020-12-29 MED ORDER — CLOPIDOGREL BISULFATE 75 MG PO TABS
75.0000 mg | ORAL_TABLET | Freq: Every day | ORAL | Status: DC
  Administered 2020-12-29: 75 mg via ORAL
  Filled 2020-12-29: qty 1

## 2020-12-29 MED ORDER — LEVOTHYROXINE SODIUM 100 MCG PO TABS
100.0000 ug | ORAL_TABLET | Freq: Every day | ORAL | Status: DC
  Administered 2020-12-30 – 2021-01-11 (×13): 100 ug via ORAL
  Filled 2020-12-29 (×13): qty 1

## 2020-12-29 MED ORDER — MILRINONE LACTATE IN DEXTROSE 20-5 MG/100ML-% IV SOLN
INTRAVENOUS | Status: AC | PRN
  Administered 2020-12-29: .25 ug/kg/min via INTRAVENOUS

## 2020-12-29 MED ORDER — CHLORHEXIDINE GLUCONATE CLOTH 2 % EX PADS
6.0000 | MEDICATED_PAD | Freq: Every day | CUTANEOUS | Status: DC
  Administered 2020-12-29 – 2021-01-11 (×13): 6 via TOPICAL

## 2020-12-29 MED ORDER — HEPARIN (PORCINE) 25000 UT/250ML-% IV SOLN
850.0000 [IU]/h | INTRAVENOUS | Status: DC
  Administered 2020-12-29: 850 [IU]/h via INTRAVENOUS
  Filled 2020-12-29: qty 250

## 2020-12-29 MED ORDER — NOREPINEPHRINE 4 MG/250ML-% IV SOLN
INTRAVENOUS | Status: AC
  Filled 2020-12-29: qty 250

## 2020-12-29 MED ORDER — MIDAZOLAM HCL 2 MG/2ML IJ SOLN
INTRAMUSCULAR | Status: AC
  Filled 2020-12-29: qty 2

## 2020-12-29 MED ORDER — ASPIRIN 81 MG PO CHEW
81.0000 mg | CHEWABLE_TABLET | Freq: Once | ORAL | Status: AC
  Administered 2020-12-29: 81 mg via ORAL
  Filled 2020-12-29: qty 1

## 2020-12-29 MED ORDER — HEPARIN BOLUS VIA INFUSION
3000.0000 [IU] | Freq: Once | INTRAVENOUS | Status: AC
  Administered 2020-12-29: 3000 [IU] via INTRAVENOUS

## 2020-12-29 MED ORDER — VERAPAMIL HCL 2.5 MG/ML IV SOLN
INTRAVENOUS | Status: AC
  Filled 2020-12-29: qty 2

## 2020-12-29 MED ORDER — HEPARIN SODIUM (PORCINE) 1000 UNIT/ML IJ SOLN
INTRAMUSCULAR | Status: AC
  Filled 2020-12-29: qty 1

## 2020-12-29 MED ORDER — SODIUM BICARBONATE 8.4 % IV SOLN
INTRAVENOUS | Status: DC | PRN
  Administered 2020-12-29 (×2): 50 meq via INTRAVENOUS

## 2020-12-29 MED ORDER — FENTANYL CITRATE (PF) 100 MCG/2ML IJ SOLN
INTRAMUSCULAR | Status: AC
  Filled 2020-12-29: qty 2

## 2020-12-29 MED ORDER — THIAMINE HCL 100 MG PO TABS
100.0000 mg | ORAL_TABLET | Freq: Every day | ORAL | Status: DC
  Administered 2020-12-30 – 2021-01-11 (×12): 100 mg via ORAL
  Filled 2020-12-29 (×12): qty 1

## 2020-12-29 MED ORDER — LIDOCAINE HCL (PF) 1 % IJ SOLN
INTRAMUSCULAR | Status: AC
  Filled 2020-12-29: qty 30

## 2020-12-29 MED ORDER — FENTANYL CITRATE (PF) 100 MCG/2ML IJ SOLN
INTRAMUSCULAR | Status: DC | PRN
  Administered 2020-12-29: 25 ug via INTRAVENOUS

## 2020-12-29 MED ORDER — SODIUM CHLORIDE 0.9 % IV SOLN
INTRAVENOUS | Status: DC
Start: 2020-12-29 — End: 2020-12-31

## 2020-12-29 MED ORDER — SODIUM CHLORIDE 0.9% FLUSH: 3.0000 mL | INTRAVENOUS | Status: DC | PRN

## 2020-12-29 MED ORDER — SODIUM CHLORIDE 0.9% FLUSH
3.0000 mL | Freq: Two times a day (BID) | INTRAVENOUS | Status: DC
Start: 2020-12-29 — End: 2020-12-31
  Administered 2020-12-30: 3 mL via INTRAVENOUS

## 2020-12-29 MED ORDER — ACETAMINOPHEN 325 MG PO TABS
650.0000 mg | ORAL_TABLET | ORAL | Status: DC | PRN
  Administered 2021-01-05 – 2021-01-09 (×5): 650 mg via ORAL
  Filled 2020-12-29 (×4): qty 2

## 2020-12-29 MED ORDER — HEPARIN SODIUM (PORCINE) 5000 UNIT/ML IJ SOLN
INTRAVENOUS | Status: DC
  Filled 2020-12-29 (×4): qty 10

## 2020-12-29 MED ORDER — SODIUM CHLORIDE 0.9 % IV SOLN
1.0000 g | INTRAVENOUS | Status: DC
  Administered 2020-12-29 – 2020-12-31 (×3): 1 g via INTRAVENOUS
  Filled 2020-12-29 (×3): qty 10

## 2020-12-29 MED ORDER — MIDAZOLAM HCL 2 MG/2ML IJ SOLN
INTRAMUSCULAR | Status: DC | PRN
  Administered 2020-12-29: 1 mg via INTRAVENOUS

## 2020-12-29 MED ORDER — SODIUM CHLORIDE 0.9% FLUSH
3.0000 mL | Freq: Two times a day (BID) | INTRAVENOUS | Status: DC
  Administered 2020-12-29 – 2020-12-30 (×2): 3 mL via INTRAVENOUS

## 2020-12-29 MED ORDER — ASPIRIN EC 81 MG PO TBEC
81.0000 mg | DELAYED_RELEASE_TABLET | Freq: Every day | ORAL | Status: DC
  Administered 2020-12-30 – 2021-01-11 (×13): 81 mg via ORAL
  Filled 2020-12-29 (×13): qty 1

## 2020-12-29 MED ORDER — ONDANSETRON HCL 4 MG/2ML IJ SOLN: 4.0000 mg | Freq: Four times a day (QID) | INTRAMUSCULAR | Status: DC | PRN

## 2020-12-29 MED ORDER — SODIUM CHLORIDE 0.9 % IV SOLN: 250.0000 mL | INTRAVENOUS | Status: DC | PRN

## 2020-12-29 MED ORDER — CHLORHEXIDINE GLUCONATE CLOTH 2 % EX PADS
6.0000 | MEDICATED_PAD | Freq: Every day | CUTANEOUS | Status: DC
  Administered 2020-12-30: 6 via TOPICAL

## 2020-12-29 MED ORDER — HEPARIN (PORCINE) IN NACL 1000-0.9 UT/500ML-% IV SOLN
INTRAVENOUS | Status: DC | PRN
  Administered 2020-12-29 (×2): 500 mL

## 2020-12-29 MED ORDER — SODIUM CHLORIDE 0.9% FLUSH: 3.0000 mL | Freq: Two times a day (BID) | INTRAVENOUS | Status: DC

## 2020-12-29 MED ORDER — SODIUM CHLORIDE 0.9 % IV SOLN: INTRAVENOUS | Status: DC

## 2020-12-29 MED ORDER — HEPARIN (PORCINE) IN NACL 1000-0.9 UT/500ML-% IV SOLN
INTRAVENOUS | Status: AC
  Filled 2020-12-29: qty 500

## 2020-12-29 MED ORDER — MILRINONE LACTATE IN DEXTROSE 20-5 MG/100ML-% IV SOLN
0.3750 ug/kg/min | INTRAVENOUS | Status: DC
Start: 2020-12-29 — End: 2021-01-05
  Administered 2020-12-29 – 2021-01-05 (×13): 0.375 ug/kg/min via INTRAVENOUS
  Filled 2020-12-29 (×14): qty 100

## 2020-12-29 MED ORDER — SODIUM CHLORIDE 0.9 % IV SOLN: 1.0000 g | Freq: Once | INTRAVENOUS | Status: DC

## 2020-12-29 MED ORDER — IOHEXOL 350 MG/ML SOLN
INTRAVENOUS | Status: DC | PRN
  Administered 2020-12-29: 75 mL

## 2020-12-29 MED ORDER — LIDOCAINE HCL (PF) 1 % IJ SOLN
INTRAMUSCULAR | Status: DC | PRN
  Administered 2020-12-29: 15 mL
  Administered 2020-12-29 (×2): 2 mL

## 2020-12-29 MED ORDER — HEPARIN SODIUM (PORCINE) 1000 UNIT/ML IJ SOLN
INTRAMUSCULAR | Status: DC | PRN
  Administered 2020-12-29: 3000 [IU] via INTRAVENOUS
  Administered 2020-12-29 (×2): 4000 [IU] via INTRAVENOUS
  Administered 2020-12-29: 2000 [IU] via INTRAVENOUS

## 2020-12-29 MED ORDER — VERAPAMIL HCL 2.5 MG/ML IV SOLN
INTRAVENOUS | Status: DC | PRN
  Administered 2020-12-29: 10 mL via INTRA_ARTERIAL

## 2020-12-29 SURGICAL SUPPLY — 27 items
CATH 5FR JL3.5 JR4 ANG PIG MP (CATHETERS) ×1 IMPLANT
CATH INFINITI 5FR JL4 (CATHETERS) ×1 IMPLANT
CATH QUICKCROSS SUPP .035X90CM (MICROCATHETER) ×1 IMPLANT
CATH SUPERCROSS ANGLED 90 DEG (MICROCATHETER) ×1 IMPLANT
CATH SWAN GANZ VIP 7.5F (CATHETERS) ×1 IMPLANT
CATH TELEPORT (CATHETERS) ×1 IMPLANT
CATH VENTURE RX (CATHETERS) ×1 IMPLANT
DEVICE RAD TR BAND REGULAR (VASCULAR PRODUCTS) ×1 IMPLANT
GLIDESHEATH SLEND SS 6F .021 (SHEATH) ×1 IMPLANT
GUIDEWIRE INQWIRE 1.5J.035X260 (WIRE) IMPLANT
INQWIRE 1.5J .035X260CM (WIRE) ×3
KIT HEART LEFT (KITS) ×3 IMPLANT
KIT MICROPUNCTURE NIT STIFF (SHEATH) ×1 IMPLANT
PACK CARDIAC CATHETERIZATION (CUSTOM PROCEDURE TRAY) ×3 IMPLANT
SET IMPELLA CP PUMP (CATHETERS) ×1 IMPLANT
SHEATH PINNACLE 5F 10CM (SHEATH) ×1 IMPLANT
SHEATH PINNACLE 8F 10CM (SHEATH) ×1 IMPLANT
SHEATH PROBE COVER 6X72 (BAG) ×1 IMPLANT
SLEEVE REPOSITIONING LENGTH 30 (MISCELLANEOUS) ×1 IMPLANT
SYR MEDRAD MARK 7 150ML (SYRINGE) ×3 IMPLANT
TRANSDUCER W/STOPCOCK (MISCELLANEOUS) ×3 IMPLANT
TUBING CIL FLEX 10 FLL-RA (TUBING) ×3 IMPLANT
WIRE ASAHI PROWATER 180CM (WIRE) ×1 IMPLANT
WIRE ASAHI PROWATER 300CM (WIRE) ×2 IMPLANT
WIRE HI TORQ VERSACORE-J 145CM (WIRE) ×1 IMPLANT
WIRE HI TORQ WHISPER MS 190CM (WIRE) ×1 IMPLANT
WIRE J 3MM .035X145CM (WIRE) ×1 IMPLANT

## 2020-12-29 NOTE — ED Notes (Signed)
Report given to Barbara RN with [facility] 

## 2020-12-29 NOTE — Progress Notes (Addendum)
ANTICOAGULATION CONSULT NOTE - Initial Consult  Pharmacy Consult for heparin Indication: chest pain/ACS  Allergies  Allergen Reactions   Ciprofloxacin    Erythromycin    Penicillins    Ampicillin Rash    Mostly topical per daughter    Patient Measurements: Height: 5' (152.4 cm) Weight: 64 kg (141 lb) IBW/kg (Calculated) : 45.5 Heparin Dosing Weight: 64kg  Vital Signs: Temp: 98.3 F (36.8 C) (09/26 2027) Temp Source: Oral (09/26 2027) BP: 96/73 (09/27 0145) Pulse Rate: 84 (09/27 0145)  Labs: Recent Labs    12/28/20 2036 12/29/20 0014  HGB 8.7*  --   HCT 27.2*  --   PLT 360  --   CREATININE 1.48*  --   TROPONINIHS  --  7,477*    Estimated Creatinine Clearance: 27.8 mL/min (A) (by C-G formula based on SCr of 1.48 mg/dL (H)).   Medical History: Past Medical History:  Diagnosis Date   Anemia    CVA (cerebral vascular accident) (HCC)    Tobacco abuse, in remission     Medications:  (Not in a hospital admission)  Scheduled:  Infusions:   cefTRIAXone (ROCEPHIN)  IV      Assessment: Pt presented to the ED for possible UTI and CP. PCN allergy is mostly topical reaction a long time ago per daughter. Heparin has been ordered to r/o MI. She was heparin during her last admission.   Ok to change aztreonam and vanc to ceftriaxone per Dr. Eudelia Bunch  Scr 1.48 Hgb 8.7  Plt 360 BNP >4500 Troponin 7477  Goal of Therapy:  Heparin level 0.3-0.7 units/ml Monitor platelets by anticoagulation protocol: Yes   Plan:  Heparin 3000 units x1 Heparin infusion at 850 units/hr Check 8 hr HL Daily HL and CBC  Ulyses Southward, PharmD, BCIDP, AAHIVP, CPP Infectious Disease Pharmacist 12/29/2020 2:22 AM

## 2020-12-29 NOTE — H&P (View-Only) (Signed)
Echo preliminarily reviewed with Dr. Christopher. LVEF now reduce with WMA. Will add on for right and left heart cath this afternoon pending consent.  NPO. 

## 2020-12-29 NOTE — Telephone Encounter (Signed)
Spoke with Keri with home health, advised her of Dr. Lupe Carney recommendations and she verbalized understanding.   Advised her to call back with any issues or concerns and she verbalized understanding.   Since there is not actually an active cardiovascular issue, I would recommend they seek this out from Neuro.  Thanks,  GA

## 2020-12-29 NOTE — Progress Notes (Signed)
ANTICOAGULATION CONSULT NOTE  Pharmacy Consult for heparin Indication:  Impella  Allergies  Allergen Reactions   Ciprofloxacin    Erythromycin    Penicillins    Ampicillin Rash    Mostly topical per daughter    Patient Measurements: Height: 5' (152.4 cm) Weight: 64 kg (141 lb) IBW/kg (Calculated) : 45.5 Heparin Dosing Weight: 64kg  Vital Signs: Temp: 99.3 F (37.4 C) (09/27 2045) Temp Source: Core (09/27 2015) BP: 120/84 (09/27 2045) Pulse Rate: 77 (09/27 2045)  Labs: Recent Labs    12/28/20 2036 12/29/20 0014 12/29/20 0143 12/29/20 0714 12/29/20 1038 12/29/20 1044 12/29/20 1227 12/29/20 1520 12/29/20 2116  HGB 8.7*  --   --  7.4*   < > 6.5* 5.8* 6.5*  --   HCT 27.2*  --   --  23.8*   < > 19.0* 17.0* 20.8*  --   PLT 360  --   --  270  --   --   --  243  --   HEPARINUNFRC  --   --   --   --   --   --   --   --  0.28*  CREATININE 1.48*  --   --  1.66*  --   --   --   --   --   TROPONINIHS  --  7,477* 7,263*  --   --   --   --   --   --    < > = values in this interval not displayed.     Estimated Creatinine Clearance: 24.8 mL/min (A) (by C-G formula based on SCr of 1.66 mg/dL (H)).   Assessment: 77 yoF admitted w/ CP s/p LHC with Impella CP placement. Pharmacy consulted for systemic heparin management.  Pt with some oozing post-cath - discussed with Dr. [place], will begin heparinized purge and hold systemic heparin x4h. At that point, once ACT <160 - will add low-dose systemic heparin as needed.  ACT < 160 and able to start systemic heparin per protocol; however, heparin level is also therapeutic at 0.28 units/mL from the purge solution.  Patient continues to have bleeding/oozing throughout the evening and it has now stabilized.  RN reports there were 3 hematomas (groin, ART line and TR band) that also stabilized.  RN had to inflate TR band to stop/reduce the bleeding.  Goal of Therapy:  Heparin level 0.2-0.5 units/ml Monitor platelets by anticoagulation  protocol: Yes   Plan:  Heparinized purge (50 units/ml) Hold off on starting systemic heparin F/U AM labs  Addeline Calarco D. Laney Potash, PharmD, BCPS, BCCCP 12/29/2020, 10:14 PM

## 2020-12-29 NOTE — Progress Notes (Signed)
  Echocardiogram 2D Echocardiogram has been performed.  Jane Navarro 12/29/2020, 4:47 PM

## 2020-12-29 NOTE — ED Notes (Signed)
Report given to [facility] on [id number]

## 2020-12-29 NOTE — Progress Notes (Signed)
Echo preliminarily reviewed with Dr. Cristal Deer. LVEF now reduce with WMA. Will add on for right and left heart cath this afternoon pending consent.  NPO.

## 2020-12-29 NOTE — Consult Note (Addendum)
Advanced Heart Failure Team Consult Note   Primary Physician: Pcp, No PCP-Cardiologist:  Parke Poisson, MD  Reason for Consultation: Cardiogenic Shock   HPI:    Jane Navarro is seen today for evaluation of cardiogenic shock  at the request of Dr [place].   Jane Navarro is a 75 year old with history of htn, hyperlipidemia, CVA, hypothyroidism, and CKD Stage IIIa.   Admitted 12/05/20 with AMS in the setting of severe hypothyroidism and small stroke left ventricular gyrus. Neurology consulted. 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 Started on levothyroxine and discharged to rehab.  Discharged to home 3 days but had functional decline.   Presented to ED with chest pain. Pertinent labs included: HS Trop 4680>3212, AST 2238, ALT 1270, BNP > 4500, hgb 8.7, creatinine 1.6, and WBC 13.6. UA with small leukocytes. Started on rochephin. Cardiology consulted. Echo today, LVEF 25-30%, WMA, RV severely reduced, LA/RA dilated, and TV mod-severe regurgitation. Also with small hypermobile filament in LV consistent with previously described mass.  Taken for cath. Hypotensive/acidotic in the lab. PA sat 39%. Given bicarb x 2 and started on milrinone. Impella CP placed R groin + swan + arterial line.   LHC/RHC  Severe left main and multivessel with proximal LAD and mid RCA RA 12 PA 29/16 PCWP 17 PAPi 1  Fick CO 3.1 CI 1.6  Thermo  2.5/1.6  Swan #s on 0.25 mcg Milrinone + Impella CP  Impella CPO 0.7  flow 3.3 liters  CO-OX 40%  CVP 9 PA 43/20  SVR 2332   CO 3.1 CI 2.1  Hgb 8.7>7.4  Procalcitonin 4.1   Review of Systems: [y] = yes, [ ]  = no   General: Weight gain [ ] ; Weight loss [ ] ; Anorexia [ ] ; Fatigue [ Y]; Fever [ ] ; Chills [ ] ; Weakness [ Y]  Cardiac: Chest pain/pressure [ ] ; Resting SOB [ ] ; Exertional SOB [ Y]; Orthopnea [ Y];  Pedal Edema [ Y]; Palpitations [ ] ; Syncope [ ] ; Presyncope [ ] ; Paroxysmal nocturnal dyspnea[ ]   Pulmonary: Cough [ ] ; Wheezing[ ] ; Hemoptysis[ ] ; Sputum [ ] ; Snoring [ ]   GI: Vomiting[ ] ; Dysphagia[ ] ; Melena[ ] ; Hematochezia [ ] ; Heartburn[ ] ; Abdominal pain [ ] ; Constipation [ ] ; Diarrhea [ ] ; BRBPR [ ]   GU: Hematuria[ ] ; Dysuria [ ] ; Nocturia[ ]   Vascular: Pain in legs with walking [ ] ; Pain in feet with lying flat [ ] ; Non-healing sores [ ] ; Stroke [Y ]; TIA [ ] ; Slurred speech [ ] ;  Neuro: Headaches[ ] ; Vertigo[ ] ; Seizures[ ] ; Paresthesias[ ] ;Blurred vision [ ] ; Diplopia [ ] ; Vision changes [ ]   Ortho/Skin: Arthritis [ ] ; Joint pain [ ] ; Muscle pain [ ] ; Joint swelling [ ] ; Back Pain [ ] ; Rash [ ]   Psych: Depression[ ] ; Anxiety[ ]   Heme: Bleeding problems [ ] ; Clotting disorders [ ] ; Anemia [ Y]  Endocrine: Diabetes [ ] ; Thyroid dysfunction[Y]  Home Medications Prior to Admission medications   Medication Sig Start Date End Date Taking? Authorizing Provider  amLODipine (NORVASC) 5 MG tablet Take 1 tablet (5 mg total) by mouth daily. 12/13/20  Yes Regalado, Belkys A, MD  aspirin EC 81 MG EC tablet Take 1 tablet (81 mg total) by mouth daily. Swallow whole. 12/13/20  Yes Regalado, , MD  atorvastatin (LIPITOR) 80 MG tablet Take 1 tablet (80 mg total) by mouth daily. 12/13/20  Yes Regalado, Belkys A, MD  cetirizine HCl (ZYRTEC) 5 MG/5ML SOLN Take 5 mg by mouth daily as needed for allergies.   Yes [provider]  clopidogrel (PLAVIX) 75 MG tablet Take 1 tablet (75 mg total) by mouth daily. 12/13/20  Yes Regalado, Belkys A, MD  levothyroxine (SYNTHROID) 100 MCG tablet Take 1 tablet (100 mcg total) by mouth daily at 6 (six) AM. 12/13/20  Yes Regalado, Belkys A, MD  thiamine 100 MG tablet Take 1 tablet (100 mg total) by mouth daily. 12/13/20  Yes Regalado, Belkys A, MD  vitamin B-12 (CYANOCOBALAMIN) 500 MCG tablet Take 500 mcg by mouth daily.   Yes [provider]  bisacodyl  (DULCOLAX) 10 MG suppository Place 1 suppository (10 mg total) rectally daily as needed for moderate constipation. Patient not taking: Reported on 12/29/2020 12/12/20   Regalado, Jon Billings A, MD  polyethylene glycol (MIRALAX / GLYCOLAX) 17 g packet Take 17 g by mouth 2 (two) times daily. Patient not taking: No sig reported 12/12/20   Regalado, Belkys A, MD  vitamin B-12 100 MCG tablet Take 1 tablet (100 mcg total) by mouth daily. Patient not taking: No sig reported 12/13/20   Alba Cory, MD    Past Medical History: Past Medical History:  Diagnosis Date   Anemia    CVA (cerebral vascular accident) (HCC)    Tobacco abuse, in remission     Past Surgical History: Past Surgical History:  Procedure Laterality Date   TEE WITHOUT CARDIOVERSION N/A 12/11/2020   Procedure: TRANSESOPHAGEAL ECHOCARDIOGRAM (TEE);  Surgeon: Wendall Stade, MD;  Location: [facility] ENDOSCOPY;  Service: Cardiovascular;  Laterality: N/A;   TOTAL ABDOMINAL HYSTERECTOMY      Family History: Family History  Problem Relation Age of Onset   Breast cancer Mother    Dementia Mother    Transient ischemic attack Mother    Parkinson's disease Father     Social History: Social History   Socioeconomic History   Marital status: Divorced    Spouse name: Not on file   Number of children: Not on file   Years of education: Not on file   Highest education level: Not on file  Occupational History   Not on file  Tobacco Use   Smoking status: Former   Smokeless tobacco: Former   Tobacco comments:    Quit smoking in her late 31s  Substance and Sexual Activity   Alcohol use: Not Currently   Drug use: Never   Sexual activity: Not on file  Other Topics Concern   Not on file  Social History Narrative   Not on file   Social Determinants of Health   Financial Resource Strain: Not on file  Food Insecurity: Not on file  Transportation Needs: Not on file  Physical Activity: Not on file  Stress: Not on file  Social  Connections: Not on file    Allergies:  Allergies  Allergen Reactions   Ciprofloxacin    Erythromycin    Penicillins    Ampicillin Rash    Mostly topical per daughter    Objective:    Vital Signs:   Temp:  [98 F (36.7 C)-98.3 F (36.8 C)] 98.3 F (36.8 C) (09/27 0955) Pulse Rate:  [30-94] 85 (09/27 0955) Resp:  [16-36] 20 (09/27 0955) BP: (90-107)/(70-79) 98/74 (09/27 0955) SpO2:  [90 %-100 %] 98 % (09/27 1004) Weight:  [64 kg] 64 kg (09/27  0200) Last BM Date: 12/28/20  Weight change: Filed Weights   12/29/20 0200  Weight: 64 kg    Intake/Output:   Intake/Output Summary (Last 24 hours) at 12/29/2020 1620 Last data filed at 12/29/2020 0307 Gross per 24 hour  Intake 100 ml  Output --  Net 100 ml      Physical Exam   CVP 8  General:  No resp difficulty HEENT: normal Neck: supple. JVP 6-7  Carotids 2+ bilat; no bruits. No lymphadenopathy or thyromegaly appreciated. RIJ swan  Cor: PMI nondisplaced. Regular rate & rhythm. No rubs, gallops or murmurs. Lungs: clear on 2 liters [place].  Abdomen: soft, nontender, nondistended. No hepatosplenomegaly. No bruits or masses. Good bowel sounds.  Extremities: no cyanosis, clubbing, rash, edema. R groin impella hematoma marked.  R radial TR band. L radial a line. Able to doppler R and L pedal pulse. R groin femstop RLE with immobilizer Neuro: alert & orientedx3, cranial nerves grossly intact. moves all 4 extremities w/o difficulty. Affect flat  GU: foley   Telemetry  SR 80s personally reviewed.   EKG    SR LBBB 12/28/20  Labs   Basic Metabolic Panel: Recent Labs  Lab 12/28/20 2036 12/29/20 0714  NA 137 139  K 3.8 3.8  CL 104 105  CO2 19* 19*  GLUCOSE 149* 109*  BUN 64* 72*  CREATININE 1.48* 1.66*  CALCIUM 9.4 9.1  MG  --  2.7*    Liver Function Tests: Recent Labs  Lab 12/29/20 0014  AST 2,238*  ALT 1,270*  ALKPHOS 279*  BILITOT 1.9*  PROT 6.7  ALBUMIN 3.7   No results for input(s): LIPASE, AMYLASE  in the last 168 hours. Recent Labs  Lab 12/29/20 0014  AMMONIA 12    CBC: Recent Labs  Lab 12/28/20 2036 12/29/20 0714 12/29/20 1520  WBC 13.6* 12.0* 10.4  NEUTROABS  --  10.0*  --   HGB 8.7* 7.4* 6.5*  HCT 27.2* 23.8* 20.8*  MCV 101.1* 102.1* 101.5*  PLT 360 270 243    Cardiac Enzymes: No results for input(s): CKTOTAL, CKMB, CKMBINDEX, TROPONINI in the last 168 hours.  BNP: BNP (last 3 results) Recent Labs    12/29/20 0014  BNP >4,500.0*    ProBNP (last 3 results) No results for input(s): PROBNP in the last 8760 hours.   CBG: No results for input(s): GLUCAP in the last 168 hours.  Coagulation Studies: No results for input(s): LABPROT, INR in the last 72 hours.   Imaging   CT HEAD WO CONTRAST ( )  Result Date: 12/28/2020 CLINICAL DATA:  Mental status change.  Unknown cause EXAM: CT HEAD WITHOUT CONTRAST TECHNIQUE: Contiguous axial images were obtained from the base of the skull through the vertex without intravenous contrast. COMPARISON:  CT head 12/05/2020 BRAIN: BRAIN Cerebral ventricle sizes are concordant with the degree of cerebral volume loss. Patchy and confluent areas of decreased attenuation are noted throughout the deep and periventricular white matter of the cerebral hemispheres bilaterally, compatible with chronic microvascular ischemic disease. No evidence of large-territorial acute infarction. No parenchymal hemorrhage. No mass lesion. No extra-axial collection. No mass effect or midline shift. No hydrocephalus. Basilar cisterns are patent. Vascular: No hyperdense vessel. Atherosclerotic calcifications are present within the cavernous internal carotid arteries. Skull: No acute fracture or focal lesion. Sinuses/Orbits: Paranasal sinuses and mastoid air cells are clear. The orbits are unremarkable. Other: None. IMPRESSION: No acute intracranial abnormality. Electronically Signed   By: Tish Frederickson M.D.   On: 12/28/2020 23:58  CARDIAC  CATHETERIZATION  Result Date: 12/29/2020   Ost LAD lesion is 70% stenosed.   Mid LAD lesion is 95% stenosed.   Prox LAD lesion is 95% stenosed.   Dist LAD lesion is 30% stenosed.   Dist LM to Ost LAD lesion is 50% stenosed.   Ost Cx lesion is 50% stenosed.   1st Mrg lesion is 50% stenosed.   Mid RCA lesion is 90% stenosed.   RPAV lesion is 30% stenosed with 30% stenosed side branch in 3rd RPL.   RPDA lesion is 20% stenosed.   LV end diastolic pressure is moderately elevated.  Severe left main and multivessel disease with proximal LAD and mid RCA lesions. Cardiogenic shock requiring Impella placement; placement was associated with hematoma so Femstop was also placed.  LVEDP was elevated at with PA pulsatility index of 1.  Fick CI was 3.07 L/min/m2 and average thermodilution CI was 1.58 L/min/m2. Successful Swan-ganz catheter placement in RIJ and left radial arterial line placement. Recommendations:  Continue medical treatment for cardiogenic shock; revascularization options will be considered based on clinical course.   DG Chest Port 1 View  Result Date: 12/28/2020 CLINICAL DATA:  returned home Friday from rehab after adm. To hospital DX CVA , pt is unable to walk without assistance and has declined in care in 3 days that she has been home, decreased po intake, unable to care for herself EXAM: PORTABLE CHEST 1 VIEW COMPARISON:  None. FINDINGS: The heart and mediastinal contours are within normal limits. Aortic calcification. No focal consolidation. No pulmonary edema. Blunting of bilateral costophrenic angles with likely trace pleural effusions. No pneumothorax. No acute osseous abnormality. IMPRESSION: 1. Blunting of bilateral costophrenic angles with likely trace pleural effusions. 2.  Aortic Atherosclerosis (ICD10-I70.0). Electronically Signed   By: Tish Frederickson M.D.   On: 12/28/2020 23:55   ECHOCARDIOGRAM COMPLETE  Result Date: 12/29/2020    ECHOCARDIOGRAM REPORT   Patient Name:   Jane Navarro  Date of Exam: 12/29/2020 Medical Rec #:  [id number]    Height:       60.0 in Accession #:    [id number]   Weight:       141.0 lb Date of Birth:  [birth year]    BSA:          1.609 m Patient Age:    74 years     BP:           90/70 mmHg Patient Gender: F            HR:           81 bpm. Exam Location:  Inpatient Procedure: 2D Echo, 3D Echo, Color Doppler and Cardiac Doppler                                    STAT ECHO   New reduction in RV and LV function with focal wall motion abnormalities. I am                              the ordering provider. Indications:    NSTEMI  History:        Patient has prior history of Echocardiogram examinations, most                 recent 12/11/2020. History of LV mass.  Sonographer:    Irving Burton Senior RDCS Referring Phys: [phone]  ANGELA NICOLE DUKE  Sonographer Comments: Patient unable to stay on left side IMPRESSIONS  1. Left ventricular ejection fraction, by estimation, is 25 to 30%. The left ventricle has severely decreased function. The left ventricle demonstrates regional wall motion abnormalities (see scoring diagram/findings for description). There is mild left  ventricular hypertrophy. Left ventricular diastolic parameters are consistent with Grade II diastolic dysfunction (pseudonormalization). Elevated left ventricular end-diastolic pressure.  2. Right ventricular systolic function is severely reduced. The right ventricular size is moderately enlarged. There is mildly elevated pulmonary artery systolic pressure.  3. Left atrial size was moderately dilated.  4. Right atrial size was mild to moderately dilated.  5. The mitral valve is normal in structure. Moderate mitral valve regurgitation. No evidence of mitral stenosis.  6. Tricuspid valve regurgitation is moderate to severe.  7. The aortic valve is tricuspid. There is mild calcification of the aortic valve. Aortic valve regurgitation is not visualized. Mild aortic valve sclerosis is present, with no evidence of aortic valve  stenosis.  8. The inferior vena cava is dilated in size with <50% respiratory variability, suggesting right atrial pressure of 15 mmHg. Comparison(s): Changes from prior study are noted. Conclusion(s)/Recommendation(s): Changes from echo. Reduced LV and RV function with focal wall motion abnormalities concerning for ischemia. 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 Left ventricular ejection fraction, by estimation, is 25 to 30%. The left ventricle has severely decreased function. The left ventricle demonstrates regional wall motion abnormalities. The left ventricular internal cavity size was normal in size. There is mild left ventricular hypertrophy. Left ventricular diastolic parameters are consistent with Grade II diastolic dysfunction (pseudonormalization). Elevated left ventricular end-diastolic pressure.  LV Wall Scoring: The inferior septum, entire inferior wall, and apex are akinetic. The mid and distal lateral wall, anterior septum, posterior wall, and apical anterior segment are hypokinetic. The anterior wall and antero-lateral wall are normal. Right Ventricle: The right ventricular size is moderately enlarged. Right vetricular wall thickness was not well visualized. Right ventricular systolic function is severely reduced. There is mildly elevated pulmonary artery systolic pressure. The tricuspid regurgitant velocity is 2.61 m/s, and with an assumed right atrial pressure of 15 mmHg, the estimated right ventricular systolic pressure is 42.2 mmHg. Left Atrium: Left atrial size was moderately dilated. Right Atrium: Right atrial size was mild to moderately dilated. Pericardium: Trivial pericardial effusion is present. Mitral Valve: The mitral valve is normal in structure. Moderate mitral valve regurgitation. No evidence of mitral valve stenosis. Tricuspid Valve: The tricuspid valve is normal in structure. Tricuspid valve  regurgitation is moderate to severe. No evidence of tricuspid stenosis. Aortic Valve: The aortic valve is tricuspid. There is mild calcification of the aortic valve. Aortic valve regurgitation is not visualized. Mild aortic valve sclerosis is present, with no evidence of aortic valve stenosis. Pulmonic Valve: The pulmonic valve was not well visualized. Pulmonic valve regurgitation is trivial. No evidence of pulmonic stenosis. Aorta: The aortic root, ascending aorta, aortic arch and descending aorta are all structurally normal, with no evidence of dilitation or obstruction. Venous: The inferior vena cava is dilated in size with less than 50% respiratory variability, suggesting right atrial pressure of 15 mmHg. IAS/Shunts: The atrial septum is grossly normal. Additional Comments: There is a small pleural effusion in both left and right lateral regions.  LEFT VENTRICLE PLAX 2D LVIDd:         4.70 cm  Diastology LVIDs:  3.70 cm  LV e' medial:    4.03 cm/s LV PW:         0.90 cm  LV E/e' medial:  27.5 LV IVS:        0.70 cm  LV e' lateral:   8.16 cm/s LVOT diam:     1.70 cm  LV E/e' lateral: 13.6 LV SV:         24 LV SV Index:   15 LVOT Area:     2.27 cm                          3D Volume EF:                         3D EF:        27 %                         LV EDV:       134 ml                         LV ESV:       98 ml                         LV SV:        36 ml RIGHT VENTRICLE RV S prime:     4.90 cm/s TAPSE (M-mode): 0.9 cm LEFT ATRIUM             Index       RIGHT ATRIUM           Index LA diam:        2.60 cm 1.62 cm/m  RA Area:     15.10 cm LA Vol (A2C):   51.7 ml 32.13 ml/m RA Volume:   39.00 ml  24.24 ml/m LA Vol (A4C):   40.5 ml 25.17 ml/m LA Biplane Vol: 47.4 ml 29.46 ml/m  AORTIC VALVE LVOT Vmax:   76.10 cm/s LVOT Vmean:  55.600 cm/s LVOT VTI:    0.107 m  AORTA Ao Root diam: 3.10 cm Ao Asc diam:  3.10 cm MITRAL VALVE                 TRICUSPID VALVE MV Area (PHT): 5.70 cm      TR Peak grad:    27.2 mmHg MV Decel Time: 133 msec      TR Vmax:        261.00 cm/s MR Peak grad:    68.2 mmHg MR Mean grad:    37.0 mmHg   SHUNTS MR Vmax:         413.00 cm/s Systemic VTI:  0.11 m MR Vmean:        281.0 cm/s  Systemic Diam: 1.70 cm MR PISA:         2.26 cm MR PISA Eff ROA: 17 mm MR PISA Radius:  0.60 cm MV E velocity: 111.00 cm/s MV A velocity: 71.50 cm/s MV E/A ratio:  1.55 Jodelle Red MD Electronically signed by Jodelle Red MD Signature Date/Time: 12/29/2020/9:05:18 AM    Final      Medications:     Current Medications:  aspirin EC  81 mg Oral Daily   Chlorhexidine Gluconate Cloth  6 each Topical Daily   [START ON 12/30/2020] levothyroxine  100 mcg Oral Q0600  sodium chloride flush  3 mL Intravenous Q12H   sodium chloride flush  3 mL Intravenous Q12H   thiamine  100 mg Oral Daily    Infusions:  sodium chloride     cefTRIAXone (ROCEPHIN)  IV Stopped (12/29/20 0307)   heparin 50 units/mL (Impella PURGE) in dextrose 5 % 1000 mL bag     milrinone        Patient Profile   Jane Navarro is a 75 year old with history of htn, hyperlipidemia, CVA, hypothyroidism, and CKD Stage IIIa. Admitted with chest pain --NSTEMI complicated by cardiogenic shock   Assessment/Plan  NSTEMI--->CAD severe multivessel diseease-->Cardiogenic Shock -HS Trop 9326->7124. ECHO today EF 25-30% and severely reduced RV function.  - LHC with severe L main with multivessel  proximal LAD and mild RCA  -PA sat < 40%., CI 1.6.  Placed Impella 2.5 placed and started on milrinone 0.25 mcg.  -Check lactic acid, LFTs, CO-OX, blood cultures now.  -CVP 8-9 - No diuresis for now.  - No GDMT   2. Shock Liver  -Check LFTs  -On admit AST 223 ALT 1270   3. Anemia  -Hgb drifting down. 9.8>8.9>8.7>7.4.  - Hematoma noted R groin and LUE.   - Check CBC and obtain type and screen.  - hgb on CO-OX 5.9.    4. Hypothyroidism  -TSH 4.8  -Continue levothyroxine.   5. UTI  -Check UA/urine culture  - On  rocephin   6. H/O CVA  ECHO for Impella  placement.  Hgb down to 6.5. Give 2 units PRBCs now.   Length of Stay: 0  Tonye Becket, NP  12/29/2020, 4:20 PM  Advanced Heart Failure Team Pager [phone] (M-F; 7a - 5p)  Please contact [facility] for night-coverage after hours (4p -7a ) and weekends on [email or web address]  Agree with above.  &75 y/o woman with recent hospitalization for weakness, hypothyroidism and small CVA. Cardiac imaging at that time revealed normal LV function. Has been struggling since discharge.   Admitted to day with CP/shock. Cath with severe 3v CAD, EF 20% and cardiogenic shock. Impella placed and moved to ICU. Initially had some bleeding at Impella site but now improved.   Hemodynamics much improved with Impella and milrinone at 0.375. However BP and SVR remain high  Remains lethargic but arousable. Denies current CP or SOB  General:  Elderly weak appearing. No resp difficulty HEENT: normal Neck: supple. RIJ swan Carotids 2+ bilat; no bruits. No lymphadenopathy or thryomegaly appreciated. Cor: PMI nondisplaced. Regular rate & rhythm. No rubs, gallops or murmurs. Impella hum Lungs: coarse Abdomen: soft, nontender, nondistended. No hepatosplenomegaly. No bruits or masses. Good bowel sounds. Extremities: no cyanosis, clubbing, rash, edema RFA Impella with fem stop in place and surrounding hematoma. No active bleeding noted Neuro: lethargic but arousable  She is critically ill with cardiogenic shock. EF newly reduced at 20%. Cath with severe 3vCAD. Hemodynamics now improved with Impella CP and milrinone support.   Will continue current support and watch end-organ function. Adjust inotropes based on swan numbers. With low hgb will give 2u RBCs now.   She is quite debilitated at baseline and I don't think she will be candidate for CABG. I also wonder if some of her myopathy may have a nonischemic component with her recent illness that may recover with time/support. That  said she does have some RWMAs on echo and I think coronary revascularization will be important, if possible.   I discussed the case with Dr. Lynnette Caffey and if  she recovers from shock, I think best approach would to plan for PCI of LAD and RCA with mechanical support in place.   Dr. Lynnette Caffey and I discussed situation with the patient's family and they understand here risk of mortality may be at least 50%. They want to pursue aggressive care for now and we will keep them updated on a daily basis.   Impella parameters reviewed personally. Device repositioned under echo guidance and pulled back about 5cm.  CRITICAL CARE Performed by: Arvilla Meres  Total critical care time: 55 minutes  Critical care time was exclusive of separately billable procedures and treating other patients.  Critical care was necessary to treat or prevent imminent or life-threatening deterioration.  Critical care was time spent personally by me (independent of midlevel providers or residents) on the following activities: development of treatment plan with patient and/or surrogate as well as nursing, discussions with consultants, evaluation of patient's response to treatment, examination of patient, obtaining history from patient or surrogate, ordering and performing treatments and interventions, ordering and review of laboratory studies, ordering and review of radiographic studies, pulse oximetry and re-evaluation of patient's condition.  Arvilla Meres, MD  6:28 PM

## 2020-12-29 NOTE — Progress Notes (Signed)
Orthopedic Tech Progress Note Patient Details:  Jane Navarro [birth year] [id number]  Ortho Devices Type of Ortho Device: Knee Immobilizer Ortho Device/Splint Location: RLE Ortho Device/Splint Interventions: Ordered, Application, Adjustment   Post Interventions Patient Tolerated: Well Instructions Provided: Care of device  [name] 12/29/2020, 2:56 PM

## 2020-12-29 NOTE — Progress Notes (Addendum)
ANTICOAGULATION CONSULT NOTE - Initial Consult  Pharmacy Consult for heparin Indication:  Impella  Allergies  Allergen Reactions   Ciprofloxacin    Erythromycin    Penicillins    Ampicillin Rash    Mostly topical per daughter    Patient Measurements: Height: 5' (152.4 cm) Weight: 64 kg (141 lb) IBW/kg (Calculated) : 45.5 Heparin Dosing Weight: 64kg  Vital Signs: Temp: 98.3 F (36.8 C) (09/27 0955) Temp Source: Oral (09/27 0955) BP: 98/74 (09/27 0955) Pulse Rate: 85 (09/27 0955)  Labs: Recent Labs    12/28/20 2036 12/29/20 0014 12/29/20 0143 12/29/20 0714  HGB 8.7*  --   --  7.4*  HCT 27.2*  --   --  23.8*  PLT 360  --   --  270  CREATININE 1.48*  --   --  1.66*  TROPONINIHS  --  7,477* 7,263*  --     Estimated Creatinine Clearance: 24.8 mL/min (A) (by C-G formula based on SCr of 1.66 mg/dL (H)).   Medical History: Past Medical History:  Diagnosis Date   Anemia    CVA (cerebral vascular accident) (HCC)    Tobacco abuse, in remission    Assessment: 75 yoF admitted w/ CP s/p LHC with Impella CP placement. Pharmacy consulted for heparin management. Pt with some oozing post-cath - discussed with Dr. [place], will begin heparinized purge and hold systemic heparin x4h. At that point, once ACT <160 - will add low-dose systemic heparin as needed.  Goal of Therapy:  Heparin level 0.2-0.5 units/ml Monitor platelets by anticoagulation protocol: Yes   Plan:  Heparinized purge (50 units/ml) Check ACT in 4h and hourly until >160 - then add low-dose systemic heparin   Fredonia Highland, PharmD, BCPS, [facility] Clinical Pharmacist [phone] Please check AMION for all [facility] Pharmacy numbers 12/29/2020

## 2020-12-29 NOTE — H&P (Addendum)
History and Physical    Jane Navarro NAT:[id number] DOB: 1945-09-26 DOA: 12/28/2020  Referring MD/NP/PA: Shauna Hugh, MD PCP: Pcp, No  Patient coming from: Transfer from [facility]  Chief Complaint:   I have personally briefly reviewed patient's old medical records in [place] [facility]   HPI: Jane Navarro is a 75 y.o. female with medical history significant of hypertension, hyperlipidemia, CVA, hypothyroidism, CKD stage IIIa, and remote history of tobacco use who presented with complaints of weakness.  Patient had just recently been hospitalized from 9/3-9/10 after presenting with acute metabolic encephalopathy found to be secondary to severe hypothyroidism with TSH at that time noted to be 41.4, and small stroke of the left ventricular gyrus.  Thyroid ultrasound was unable to visualize thyroid suggesting marked parenchymal atrophy.  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  Patient was treated with levothyroxine 50 mcg IV daily while in the hospital and discharged to rehab on 100 mcg p.o.  She had just recently been discharged home from rehab 3 days ago.  However, since getting home patient had had a progressive decline.  Patient was reported to have complained of some chest pain and had been rubbing her chest recently.  Patient currently denies any complaints of chest pain.  Associated symptoms included decreased p.o. intake, productive cough, and malaise.  She denies any complaints of fever, chills, dysuria, abdominal pain, nausea, vomiting, diarrhea, or recent.  ED Course: Upon admission to the emergency department patient was seen to be afebrile with respirations 16-36, blood pressures 90/70-107/78, and O2 saturations as low as 90% on room air with improvement to 100% on 2 L of nasal cannula oxygen.  EKG noted a new left bundle branch block.  Labs from 9/26 significant for WBC 13.6, hemoglobin 8.7 with MCV 101, alkaline  phosphatase 279, AST 2238, ALT 1270, total bilirubin 1.9, BNP >4500, and high-sensitivity troponin 7477->7263.  Chest x-ray noted blunting of the bilateral costophrenic angles with likely trace pleural effusions. Urinalysis was positive for small leukocytes, positive nitrites, many bacteria, and 6-10 WBCs.  Cardiology have been formally consulted and stat echocardiogram ordered.  Patient had been given Rocephin IV for treatment of likely UTI.  Review of Systems  Constitutional:  Positive for malaise/fatigue. Negative for chills and fever.  HENT:  Negative for ear discharge.   Eyes:  Negative for photophobia and pain.  Respiratory:  Positive for cough and sputum production.   Cardiovascular:  Positive for chest pain. Negative for leg swelling.  Gastrointestinal:  Negative for abdominal pain, diarrhea, nausea and vomiting.  Genitourinary:  Negative for dysuria and flank pain.  Musculoskeletal:  Negative for falls.  Neurological:  Positive for weakness. Negative for loss of consciousness.  Psychiatric/Behavioral:  Negative for substance abuse.   All other systems reviewed and are negative.  Past Medical History:  Diagnosis Date   Anemia    CVA (cerebral vascular accident) (HCC)    Tobacco abuse, in remission     Past Surgical History:  Procedure Laterality Date   TEE WITHOUT CARDIOVERSION N/A 12/11/2020   Procedure: TRANSESOPHAGEAL ECHOCARDIOGRAM (TEE);  Surgeon: Wendall Stade, MD;  Location: [facility] ENDOSCOPY;  Service: Cardiovascular;  Laterality: N/A;   TOTAL ABDOMINAL HYSTERECTOMY       reports that she has quit smoking. She has quit using smokeless tobacco. She reports that she does not currently use alcohol. She reports that she does not use drugs.  Allergies  Allergen Reactions  Ciprofloxacin    Erythromycin    Penicillins    Ampicillin Rash    Mostly topical per daughter    Family History  Problem Relation Age of Onset   Breast cancer Mother    Dementia Mother     Transient ischemic attack Mother    Parkinson's disease Father     Prior to Admission medications   Medication Sig Start Date End Date Taking? Authorizing Provider  amLODipine (NORVASC) 5 MG tablet Take 1 tablet (5 mg total) by mouth daily. 12/13/20   Regalado, Jon Billings A, MD  aspirin EC 81 MG EC tablet Take 1 tablet (81 mg total) by mouth daily. Swallow whole. 12/13/20   Regalado, Belkys A, MD  atorvastatin (LIPITOR) 80 MG tablet Take 1 tablet (80 mg total) by mouth daily. 12/13/20   Regalado, Belkys A, MD  bisacodyl (DULCOLAX) 10 MG suppository Place 1 suppository (10 mg total) rectally daily as needed for moderate constipation. 12/12/20   Regalado, Belkys A, MD  clopidogrel (PLAVIX) 75 MG tablet Take 1 tablet (75 mg total) by mouth daily. 12/13/20   Regalado, Belkys A, MD  levothyroxine (SYNTHROID) 100 MCG tablet Take 1 tablet (100 mcg total) by mouth daily at 6 (six) AM. 12/13/20   Regalado, Belkys A, MD  polyethylene glycol (MIRALAX / GLYCOLAX) 17 g packet Take 17 g by mouth 2 (two) times daily. 12/12/20   Regalado, Belkys A, MD  thiamine 100 MG tablet Take 1 tablet (100 mg total) by mouth daily. 12/13/20   Regalado, Belkys A, MD  vitamin B-12 100 MCG tablet Take 1 tablet (100 mcg total) by mouth daily. 12/13/20   Regalado, Prentiss Bells, MD    Physical Exam:  Constitutional: Elderly female who appears ill Vitals:   12/29/20 0200 12/29/20 0445 12/29/20 0500 12/29/20 0641  BP:  95/79 99/76 90/70   Pulse:  79 82 82  Resp:  (!) 35 (!) 35 20  Temp:    98 F (36.7 C)  TempSrc:    Oral  SpO2:  100% 97% 95%  Weight: 64 kg     Height:       Eyes: PERRL, lids and conjunctivae normal ENMT: Mucous membranes are dry. Posterior pharynx clear of any exudate or lesions.poor dentition with multiple missing teeth Neck: normal, supple, no masses, no thyromegaly.  No significant JVD appreciated. Respiratory: Normal respiratory effort with no wheezes or rhonchi appreciated.  Patient currently on 2 L nasal cannula  oxygen with O2 saturations maintained. Cardiovascular: Regular rate and rhythm with positive systolic murmur appreciated.  Trace lower extremity edema noted on the left  2+ pedal pulses. No carotid bruits.  Abdomen: no tenderness, no masses palpated. No hepatosplenomegaly. Bowel sounds positive.  Musculoskeletal: no clubbing / cyanosis. No joint deformity upper and lower extremities. Good ROM, no contractures. Normal muscle tone.  Skin: no rashes, lesions, ulcers. No induration Neurologic: CN 2-12 grossly intact. Sensation intact, DTR normal. Strength 4/5 in all 4.  Psychiatric: Lethargic and oriented x person and place.  Patient seems to be confused in regards to why she is here.    Labs on Admission: I have personally reviewed following labs and imaging studies  CBC: Recent Labs  Lab 12/28/20 2036  WBC 13.6*  HGB 8.7*  HCT 27.2*  MCV 101.1*  PLT 360   Basic Metabolic Panel: Recent Labs  Lab 12/28/20 2036  NA 137  K 3.8  CL 104  CO2 19*  GLUCOSE 149*  BUN 64*  CREATININE 1.48*  CALCIUM 9.4  GFR: Estimated Creatinine Clearance: 27.8 mL/min (A) (by C-G formula based on SCr of 1.48 mg/dL (H)). Liver Function Tests: Recent Labs  Lab 12/29/20 0014  AST 2,238*  ALT 1,270*  ALKPHOS 279*  BILITOT 1.9*  PROT 6.7  ALBUMIN 3.7   No results for input(s): LIPASE, AMYLASE in the last 168 hours. Recent Labs  Lab 12/29/20 0014  AMMONIA 12   Coagulation Profile: No results for input(s): INR, PROTIME in the last 168 hours. Cardiac Enzymes: No results for input(s): CKTOTAL, CKMB, CKMBINDEX, TROPONINI in the last 168 hours. BNP (last 3 results) No results for input(s): PROBNP in the last 8760 hours. HbA1C: No results for input(s): HGBA1C in the last 72 hours. CBG: No results for input(s): GLUCAP in the last 168 hours. Lipid Profile: No results for input(s): CHOL, HDL, LDLCALC, TRIG, CHOLHDL, LDLDIRECT in the last 72 hours. Thyroid Function Tests: No results for  input(s): TSH, T4TOTAL, FREET4, T3FREE, THYROIDAB in the last 72 hours. Anemia Panel: No results for input(s): VITAMINB12, FOLATE, FERRITIN, TIBC, IRON, RETICCTPCT in the last 72 hours. Urine analysis:    Component Value Date/Time   COLORURINE YELLOW 12/28/2020 2330   APPEARANCEUR HAZY (A) 12/28/2020 2330   LABSPEC 1.025 12/28/2020 2330   PHURINE 6.0 12/28/2020 2330   GLUCOSEU NEGATIVE 12/28/2020 2330   HGBUR NEGATIVE 12/28/2020 2330   BILIRUBINUR NEGATIVE 12/28/2020 2330   KETONESUR 15 (A) 12/28/2020 2330   PROTEINUR 30 (A) 12/28/2020 2330   UROBILINOGEN 0.2 12/16/2014 1255   NITRITE POSITIVE (A) 12/28/2020 2330   LEUKOCYTESUR SMALL (A) 12/28/2020 2330   Sepsis Labs: Recent Results (from the past 240 hour(s))  Resp Panel by RT-PCR (Flu A&B, Covid) Nasopharyngeal Swab     Status: None   Collection Time: 12/28/20 11:17 PM   Specimen: Nasopharyngeal Swab; Nasopharyngeal(NP) swabs in vial transport medium  Result Value Ref Range Status   SARS Coronavirus 2 by RT PCR NEGATIVE NEGATIVE Final    Comment: (NOTE) SARS-CoV-2 target nucleic acids are NOT DETECTED.  The SARS-CoV-2 RNA is generally detectable in upper respiratory specimens during the acute phase of infection. The lowest concentration of SARS-CoV-2 viral copies this assay can detect is 138 copies/mL. A negative result does not preclude SARS-Cov-2 infection and should not be used as the sole basis for treatment or other patient management decisions. A negative result may occur with  improper specimen collection/handling, submission of specimen other than nasopharyngeal swab, presence of viral mutation(s) within the areas targeted by this assay, and inadequate number of viral copies(<138 copies/mL). A negative result must be combined with clinical observations, patient history, and epidemiological information. The expected result is Negative.  Fact Sheet for Patients:  BloggerCourse.com  Fact  Sheet for Healthcare Providers:  SeriousBroker.it  This test is no t yet approved or cleared by the [place] FDA and  has been authorized for detection and/or diagnosis of SARS-CoV-2 by FDA under an Emergency Use Authorization (EUA). This EUA will remain  in effect (meaning this test can be used) for the duration of the COVID-19 declaration under Section 564(b)(1) of the Act, 21 U.S.C.section 360bbb-3(b)(1), unless the authorization is terminated  or revoked sooner.       Influenza A by PCR NEGATIVE NEGATIVE Final   Influenza B by PCR NEGATIVE NEGATIVE Final    Comment: (NOTE) The Xpert Xpress SARS-CoV-2/FLU/RSV plus assay is intended as an aid in the diagnosis of influenza from Nasopharyngeal swab specimens and should not be used as a sole basis for treatment. Nasal washings  and aspirates are unacceptable for Xpert Xpress SARS-CoV-2/FLU/RSV testing.  Fact Sheet for Patients: BloggerCourse.com  Fact Sheet for Healthcare Providers: SeriousBroker.it  This test is not yet approved or cleared by the [place] FDA and has been authorized for detection and/or diagnosis of SARS-CoV-2 by FDA under an Emergency Use Authorization (EUA). This EUA will remain in effect (meaning this test can be used) for the duration of the COVID-19 declaration under Section 564(b)(1) of the Act, 21 U.S.C. section 360bbb-3(b)(1), unless the authorization is terminated or revoked.  Performed at [facility], [address] [place], [place] [address]      Radiological Exams on Admission: CT HEAD WO CONTRAST ( )  Result Date: 12/28/2020 CLINICAL DATA:  Mental status change.  Unknown cause EXAM: CT HEAD WITHOUT CONTRAST TECHNIQUE: Contiguous axial images were obtained from the base of the skull through the vertex without intravenous contrast. COMPARISON:  CT head 12/05/2020 BRAIN: BRAIN Cerebral ventricle  sizes are concordant with the degree of cerebral volume loss. Patchy and confluent areas of decreased attenuation are noted throughout the deep and periventricular white matter of the cerebral hemispheres bilaterally, compatible with chronic microvascular ischemic disease. No evidence of large-territorial acute infarction. No parenchymal hemorrhage. No mass lesion. No extra-axial collection. No mass effect or midline shift. No hydrocephalus. Basilar cisterns are patent. Vascular: No hyperdense vessel. Atherosclerotic calcifications are present within the cavernous internal carotid arteries. Skull: No acute fracture or focal lesion. Sinuses/Orbits: Paranasal sinuses and mastoid air cells are clear. The orbits are unremarkable. Other: None. IMPRESSION: No acute intracranial abnormality. Electronically Signed   By: Tish Frederickson M.D.   On: 12/28/2020 23:58   DG Chest Port 1 View  Result Date: 12/28/2020 CLINICAL DATA:  returned home Friday from rehab after adm. To hospital DX CVA , pt is unable to walk without assistance and has declined in care in 3 days that she has been home, decreased po intake, unable to care for herself EXAM: PORTABLE CHEST 1 VIEW COMPARISON:  None. FINDINGS: The heart and mediastinal contours are within normal limits. Aortic calcification. No focal consolidation. No pulmonary edema. Blunting of bilateral costophrenic angles with likely trace pleural effusions. No pneumothorax. No acute osseous abnormality. IMPRESSION: 1. Blunting of bilateral costophrenic angles with likely trace pleural effusions. 2.  Aortic Atherosclerosis (ICD10-I70.0). Electronically Signed   By: Tish Frederickson M.D.   On: 12/28/2020 23:55    EKG: Independently reviewed.  Sinus rhythm 87 bpm with left bundle branch block  Assessment/Plan  NSTEMI Left bundle branch block: Acute.  Patient had complained of chest pain a few days prior.  High-sensitivity troponins  [phone].  EKG without significant ischemic  changes.  Question recent initiation of levothyroxine elderly patient as possible cause.  She has been started on a heparin drip and cardiology formally consulted.  Stat echo noted reduced EF 25-30% with grade 2 diastolic dysfunction when EF was previously 60-65% earlier this month.  Suspect possibility of acute blockage for which cardiology plans to take patient for cardiac cath.  Question if related to high dose of levothyroxine that was started earlier this month for severe hypothyroidism. -Admit to a progressive bed -N.p.o. for need of procedure -Continue heparin per pharmacy -Continue aspirin and Plavix -Follow-up cardiac cath -Appreciate cardiology consultative services we will follow-up for further recommendation   Systolic eart failure exacerbation: Acute.  Patient does not appear grossly fluid overloaded at this time.  Labs significant for BNP greater than 4500.  Chest x-ray noted blunting of  the bilateral costophrenic angles with likely trace pleural effusions.  Echo as noted above.  Given findings of elevated liver enzymes and acute kidney injury question possibility of cardiogenic shock. -Strict intake and output -Daily weights -Per cardiology  SIRS/sepsis: Patient was noted to be initially tachypneic with white blood cell count elevated up to 13.6.  Unclear if this is secondary to NSTEMI versus urinary tract infection -Follow-up blood and urine studies -Follow-up lactic acid  Urinary tract infection: Acute.  Patient denies any complaints at this time.  Urinalysis significant for small leukocytes with positive nitrites, many bacteria and 6-10 WBCs. -Follow-up urine culture -Continue empiric antibiotics of Rocephin  Acute metabolic encephalopathy: Patient noted to be somewhat confused on physical exam with increased lethargy. -Neuro check -Follow-up repeat thyroid studies  Macrocytic anemia: Acute.  Hemoglobin 8.7 g/dL with elevated MCV of 099 on admission.  Last available  hemoglobin was similar at 8.9 g/dL on 9/9. -Continue to monitor H&H -Continue vitamin B12 supplementation  Leukocytosis: Acute.  WBC elevated at 13.6.  Patient was otherwise noted to be afebrile.  Suspect likely secondary to NSTEMI and/or UTI. -Continue to monitor  Transaminitis and hyperbilirubinemia: Acute.  On admission alkaline phosphatase 279, AST 2238, ALT 1270, and total bilirubin 1.9.  Suspect this is secondary to congestive hepatopathy. -Follow-up acetaminophen level -Check acute hepatitis panel -Held statin -Continue to monitor  Acute kidney injury: Creatinine 1.48-> 1.66 and BUN 64->72 since admission.  This is greater than 0.3 increase from baseline creatinine which previously was 0.9 on 9/9.  Suspect hypoperfusion secondary to congestive heart failure exacerbation is likely cause of symptoms. -Continue to monitor kidney function  Essential hypertension: Patient's initial blood pressures were noted to be soft. -Holding home blood pressure medications at this time  Hypothyroidism: Patient was noted to have TSH of 41.462 on 9/3. -Follow-up repeat TSH -Resume levothyroxine when medically approved  History of CVA: Patient was noted on MRI to have had a small stroke of the left ventricular gyrus during last admission earlier this month.  No other focal deficits appreciated at this time -Continue monitor  Generalized weakness: Acute on chronic.  Patient had just been discharged from rehab therapy and after getting home and had a progressive decline currently unable to get out of bed without assistance.  Suspect related with NSTEMI and/or UTI. -Current PT/OT reevaluation once medically stable  Hyperlipidemia -Held atorvastatin due to liver dysfunction  DVT prophylaxis: Heparin Code Status: Full Family Communication: Family updated at bedside Disposition Plan: To be determined Consults called: Cardiology Admission status: Inpatient require more than 2 midnight hospital  stay  Clydie Braun MD [facility]   If 7PM-7AM, please contact night-coverage   12/29/2020, 7:29 AM

## 2020-12-29 NOTE — Progress Notes (Signed)
Hospital Medicine Brief Acceptance Note  75 year old female with past medical history of hypothyroidism, hyperlipidemia, hypertension, chronic kidney disease stage IIIa with recent hospitalization at [facility] in early September for severe hypothyroidism requiring a course of intravenous Synthroid.  Patient was additionally found to have a small acute stroke of the left cingulate gyrus.  During the stroke work-up there is identification of a possible mass of the left ventricle.  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  Patient was eventually discharged to a skilled nursing facility on 9/10.  Patient was then eventually discharged home 3 days ago.    Since then, family reports patient has rapidly declined with poor oral intake and lethargy.  Patient presented to [facility] for evaluation and was found to have a new left bundle branch block on EKG with markedly elevated troponin of 7477 as well as a BNP of greater than 4500 and markedly elevated transaminases with an AST of 2238 and ALT of 1270.    Concern for ACS with acute heart failure and impending cardiogenic shock with congestive hepatopathy.  ER provider discussed case with Dr. Joyce Gross with radiology who recommends initiation of heparin infusion and transfer to [facility].  Cardiology wishes to be notified upon arrival for urgent consultation.  No beds available at [facility] so therefore patient will be transferred as ER to ER.  This patient has been accepted to the [facility] Hospitalist service.   Patient currently has a bed request order placed by the hospitalist provider.    While awaiting a bed, [facility] is available for medical guidance in management of this patient. However, please direct all requests for new orders to the ER provider while the patient remains at [facility].    Shauna Hugh MD [facility]

## 2020-12-29 NOTE — ED Provider Notes (Addendum)
[facility] EMERGENCY DEPARTMENT Provider Note  CSN: [id number] Arrival date & time: 12/28/20 2015  Chief Complaint(s) Weakness  HPI Jane Navarro is a 75 y.o. female with a past medical history listed below recently admitted early September for altered mental status found to have a small stroke not correlating with her symptoms but also noted to have hypothyroidism.  She was started on Synthroid.  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  She was subsequently discharged to a nursing facility and was discharged home 3 days ago.  Patient was doing well up including walking with assistance, until she was discharged home when she began having increased lethargy.  She has had decreased oral intake.  Patient apparently complained of sudden onset of chest pressure (described as really bad heartburn) a couple days ago which has since subsided. Unsure of duration. No alleviating or aggravating factors.  No recent fevers or infections. No coughing or congestion. No shortness of breath. No abdominal pain. No nausea or vomiting. Patient does have a history of constipation.  Family reports that she has been compliant with her medications.    Weakness  Past Medical History Past Medical History:  Diagnosis Date   Anemia    CVA (cerebral vascular accident) (HCC)    Tobacco abuse, in remission    Patient Active Problem List   Diagnosis Date Noted   NSTEMI (non-ST elevated myocardial infarction) (HCC) 12/29/2020   Cerebral thrombosis with cerebral infarction 12/09/2020   Acute metabolic encephalopathy 12/05/2020   Hypertensive urgency 12/05/2020   CKD (chronic kidney disease), stage III (HCC) 12/05/2020   Normocytic anemia 12/05/2020   Transaminitis 12/05/2020   Home Medication(s) Prior to Admission medications   Medication Sig Start Date End Date Taking? Authorizing Provider  amLODipine (NORVASC) 5 MG tablet  Take 1 tablet (5 mg total) by mouth daily. 12/13/20   Regalado, Jon Billings A, MD  aspirin EC 81 MG EC tablet Take 1 tablet (81 mg total) by mouth daily. Swallow whole. 12/13/20   Regalado, Belkys A, MD  atorvastatin (LIPITOR) 80 MG tablet Take 1 tablet (80 mg total) by mouth daily. 12/13/20   Regalado, Belkys A, MD  bisacodyl (DULCOLAX) 10 MG suppository Place 1 suppository (10 mg total) rectally daily as needed for moderate constipation. 12/12/20   Regalado, Belkys A, MD  clopidogrel (PLAVIX) 75 MG tablet Take 1 tablet (75 mg total) by mouth daily. 12/13/20   Regalado, Belkys A, MD  levothyroxine (SYNTHROID) 100 MCG tablet Take 1 tablet (100 mcg total) by mouth daily at 6 (six) AM. 12/13/20   Regalado, Belkys A, MD  polyethylene glycol (MIRALAX / GLYCOLAX) 17 g packet Take 17 g by mouth 2 (two) times daily. 12/12/20   Regalado, Belkys A, MD  thiamine 100 MG tablet Take 1 tablet (100 mg total) by mouth daily. 12/13/20   Regalado, Belkys A, MD  vitamin B-12 100 MCG tablet Take 1 tablet (100 mcg total) by mouth daily. 12/13/20   Regalado, Prentiss Bells, MD  Past Surgical History Past Surgical History:  Procedure Laterality Date   TEE WITHOUT CARDIOVERSION N/A 12/11/2020   Procedure: TRANSESOPHAGEAL ECHOCARDIOGRAM (TEE);  Surgeon: Wendall Stade, MD;  [facility];  Service: Cardiovascular;  Laterality: N/A;   TOTAL ABDOMINAL HYSTERECTOMY     Family History Family History  Problem Relation Age of Onset   Breast cancer Mother    Dementia Mother    Transient ischemic attack Mother    Parkinson's disease Father     Social History Social History   Tobacco Use   Smoking status: Former   Smokeless tobacco: Former   Tobacco comments:    Quit smoking in her late 36s  Substance Use Topics   Alcohol use: Not Currently   Drug use: Never   Allergies Ciprofloxacin,  Erythromycin, Penicillins, and Ampicillin  Review of Systems Review of Systems  Neurological:  Positive for weakness.  All other systems are reviewed and are negative for acute change except as noted in the HPI  Physical Exam Vital Signs  I have reviewed the triage vital signs BP 99/76   Pulse 82   Temp 98.3 F (36.8 C) (Oral)   Resp (!) 35   Ht 5' (1.524 m)   Wt 64 kg   SpO2 97%   BMI 27.54 kg/m   Physical Exam Vitals reviewed.  Constitutional:      General: She is not in acute distress.    Appearance: She is well-developed. She is not diaphoretic.  HENT:     Head: Normocephalic and atraumatic.     Nose: Nose normal.  Eyes:     General: No scleral icterus.       Right eye: No discharge.        Left eye: No discharge.     Conjunctiva/sclera: Conjunctivae normal.     Pupils: Pupils are equal, round, and reactive to light.  Cardiovascular:     Rate and Rhythm: Normal rate and regular rhythm.     Heart sounds: No murmur heard.   No friction rub. No gallop.  Pulmonary:     Effort: Pulmonary effort is normal. Tachypnea present. No respiratory distress.     Breath sounds: Normal breath sounds. No stridor. No wheezing, rhonchi or rales.  Abdominal:     General: There is no distension.     Palpations: Abdomen is soft.     Tenderness: There is no abdominal tenderness.  Musculoskeletal:        General: No tenderness.     Cervical back: Normal range of motion and neck supple.     Right lower leg: 1+ Pitting Edema present.     Left lower leg: 1+ Pitting Edema present.  Skin:    General: Skin is warm and dry.     Findings: No erythema or rash.  Neurological:     Mental Status: She is alert and oriented to person, place, and time.     Comments: Mental Status:  Alert and oriented to person, place, and time.  Attention and concentration slowed.  Speech clear.  Recent memory is intact  Cranial Nerves:  II Visual Fields: Intact to confrontation. Visual fields  intact. III, IV, VI: Pupils equal and reactive to light and near. Full eye movement without nystagmus  V Facial Sensation: Normal. No weakness of masticatory muscles  VII: No facial weakness or asymmetry  VIII Auditory Acuity: Grossly normal  IX/X: The uvula is midline; the palate elevates symmetrically  XI: Normal sternocleidomastoid and trapezius strength  XII: The  tongue is midline. No atrophy or fasciculations.   Motor System: Muscle Strength: 5/5 and symmetric in the upper and lower extremities. No pronation or drift.  Muscle Tone: Tone and muscle bulk are normal in the upper and lower extremities.  Coordination: Intact finger-to-nose. No tremor.  Sensation: Intact to light touch,. Gait: deferred     ED Results and Treatments Labs (all labs ordered are listed, but only abnormal results are displayed) Labs Reviewed  BASIC METABOLIC PANEL - Abnormal; Notable for the following components:      Result Value   CO2 19 (*)    Glucose, Bld 149 (*)    BUN 64 (*)    Creatinine, Ser 1.48 (*)    GFR, Estimated 37 (*)    All other components within normal limits  CBC - Abnormal; Notable for the following components:   WBC 13.6 (*)    RBC 2.69 (*)    Hemoglobin 8.7 (*)    HCT 27.2 (*)    MCV 101.1 (*)    RDW 16.7 (*)    nRBC 3.0 (*)    All other components within normal limits  URINALYSIS, ROUTINE W REFLEX MICROSCOPIC - Abnormal; Notable for the following components:   APPearance HAZY (*)    Ketones, ur 15 (*)    Protein, ur 30 (*)    Nitrite POSITIVE (*)    Leukocytes,Ua SMALL (*)    All other components within normal limits  HEPATIC FUNCTION PANEL - Abnormal; Notable for the following components:   AST 2,238 (*)    ALT 1,270 (*)    Alkaline Phosphatase 279 (*)    Total Bilirubin 1.9 (*)    Bilirubin, Direct 0.9 (*)    Indirect Bilirubin 1.0 (*)    All other components within normal limits  BRAIN NATRIURETIC PEPTIDE - Abnormal; Notable for the following components:   B  Natriuretic Peptide >4,500.0 (*)    All other components within normal limits  URINALYSIS, MICROSCOPIC (REFLEX) - Abnormal; Notable for the following components:   Bacteria, UA MANY (*)    All other components within normal limits  TROPONIN I (HIGH SENSITIVITY) - Abnormal; Notable for the following components:   Troponin I (High Sensitivity) 7,477 (*)    All other components within normal limits  TROPONIN I (HIGH SENSITIVITY) - Abnormal; Notable for the following components:   Troponin I (High Sensitivity) 7,263 (*)    All other components within normal limits  RESP PANEL BY RT-PCR (FLU A&B, COVID) ARPGX2  AMMONIA  TSH  T4, FREE  ETHANOL  ACETAMINOPHEN LEVEL  HEPARIN LEVEL (UNFRACTIONATED)  CBC                                                                                                                         EKG  EKG Interpretation  Date/Time:  Monday December 28 2020 23:33:53 EDT Ventricular Rate:  87 PR Interval:  162 QRS Duration: 138 QT Interval:  402 QTC Calculation: 484 R Axis:   -  47 Text Interpretation: Sinus rhythm Left bundle branch block Confirmed by Drema Pry [id number]) on 12/29/2020 12:28:11 AM       Radiology CT HEAD WO CONTRAST ( )  Result Date: 12/28/2020 CLINICAL DATA:  Mental status change.  Unknown cause EXAM: CT HEAD WITHOUT CONTRAST TECHNIQUE: Contiguous axial images were obtained from the base of the skull through the vertex without intravenous contrast. COMPARISON:  CT head 12/05/2020 BRAIN: BRAIN Cerebral ventricle sizes are concordant with the degree of cerebral volume loss. Patchy and confluent areas of decreased attenuation are noted throughout the deep and periventricular white matter of the cerebral hemispheres bilaterally, compatible with chronic microvascular ischemic disease. No evidence of large-territorial acute infarction. No parenchymal hemorrhage. No mass lesion. No extra-axial collection. No mass effect or midline shift. No  hydrocephalus. Basilar cisterns are patent. Vascular: No hyperdense vessel. Atherosclerotic calcifications are present within the cavernous internal carotid arteries. Skull: No acute fracture or focal lesion. Sinuses/Orbits: Paranasal sinuses and mastoid air cells are clear. The orbits are unremarkable. Other: None. IMPRESSION: No acute intracranial abnormality. Electronically Signed   By: Tish Frederickson M.D.   On: 12/28/2020 23:58   DG Chest Port 1 View  Result Date: 12/28/2020 CLINICAL DATA:  returned home Friday from rehab after adm. To hospital DX CVA , pt is unable to walk without assistance and has declined in care in 3 days that she has been home, decreased po intake, unable to care for herself EXAM: PORTABLE CHEST 1 VIEW COMPARISON:  None. FINDINGS: The heart and mediastinal contours are within normal limits. Aortic calcification. No focal consolidation. No pulmonary edema. Blunting of bilateral costophrenic angles with likely trace pleural effusions. No pneumothorax. No acute osseous abnormality. IMPRESSION: 1. Blunting of bilateral costophrenic angles with likely trace pleural effusions. 2.  Aortic Atherosclerosis (ICD10-I70.0). Electronically Signed   By: Tish Frederickson M.D.   On: 12/28/2020 23:55    Pertinent labs & imaging results that were available during my care of the patient were reviewed by me and considered in my medical decision making (see MDM for details).  Medications Ordered in ED Medications  cefTRIAXone (ROCEPHIN) 1 g in sodium chloride 0.9 % 100 mL IVPB (0 g Intravenous Stopped 12/29/20 0307)  heparin ADULT infusion 100 units/mL (25000 units/229mL) (850 Units/hr Intravenous New Bag/Given 12/29/20 0310)  heparin bolus via infusion 3,000 Units (3,000 Units Intravenous Bolus from Bag 12/29/20 0310)                                                                                                                                     Procedures .1-3 Lead EKG  Interpretation Performed by: Nira Conn, MD Authorized by: Nira Conn, MD     Interpretation: normal     ECG rate:  83   ECG rate assessment: normal     Rhythm: sinus rhythm     Ectopy: none     Conduction: normal   .  Critical Care Performed by: Nira Conn, MD Authorized by: Nira Conn, MD   Critical care provider statement:    Critical care time (minutes):  90   Critical care was necessary to treat or prevent imminent or life-threatening deterioration of the following conditions:  Cardiac failure and sepsis   Critical care was time spent personally by me on the following activities:  Discussions with consultants, evaluation of patient's response to treatment, examination of patient, ordering and performing treatments and interventions, ordering and review of laboratory studies, ordering and review of radiographic studies, pulse oximetry, re-evaluation of patient's condition, obtaining history from patient or surrogate and review of old charts   Care discussed with: admitting provider and accepting provider at another facility    (including critical care time)  Medical Decision Making / ED Course I have reviewed the nursing notes for this encounter and the patient's prior records (if available in EHR or on provided paperwork).  Cherith Tewell was evaluated in Emergency Department on 12/29/2020 for the symptoms described in the history of present illness. She was evaluated in the context of the global COVID-19 pandemic, which necessitated consideration that the patient might be at risk for infection with the SARS-CoV-2 virus that causes COVID-19. Institutional protocols and algorithms that pertain to the evaluation of patients at risk for COVID-19 are in a state of rapid change based on information released by regulatory bodies including the CDC and federal and state organizations. These policies and algorithms were followed during the patient's  care in the ED.     Patient presents for increased lethargy. Complain of recent chest pain. Currently has no physical complaints other than feeling tired. Exam notable for mild peripheral edema. Lungs clear to auscultation bilaterally. Will assess for renal function or electrolyte derangements.  Differential broad including infection, will assess for ACS and heart failure.  We will also obtain thyroid panel to assess for possible myxedema.  Pertinent labs & imaging results that were available during my care of the patient were reviewed by me and considered in my medical decision making:  Work-up is notable for urinary tract infection.  Patient was started on empiric antibiotics. EKG with left bundle branch block. Initial troponin elevated > 7000 with a BNP greater than 4500.  Patient also noted to have severe transaminitis in the thousands.  Patient and family updated on status and disposition.  Questions answered.  Cardiology consulted who recommended starting the patient on heparin drip. Patient will need to be transferred over to [facility] for continued work-up and management. Consulted hospitalist service and spoke to Dr. Leafy Half who accepted the patient to the stepdown unit.  Patient with soft SBP in 90s, but MAPS of 80s. No respiratory distress. Will hold diuretics. Will allow for permissive hypotension in the setting of new HF.  Currently pending transfer to [facility].  Reassessed several times throughout the stay and remained stable.  5:57 AM Transferred to [facility].   Final Clinical Impression(s) / ED Diagnoses Final diagnoses:  Altered mental status  NSTEMI (non-ST elevated myocardial infarction) (HCC)  Acute heart failure, unspecified heart failure type (HCC)  Transaminitis  Acute cystitis without hematuria     This chart was dictated using voice recognition software.  Despite best efforts to proofread,  errors can occur which can change the documentation  meaning.      Nira Conn, MD 12/29/20 1730

## 2020-12-29 NOTE — Interval H&P Note (Signed)
History and Physical Interval Note:  12/29/2020 10:10 AM  Jane Navarro  has presented today for surgery, with the diagnosis of new heart failure.  The various methods of treatment have been discussed with the patient and family. After consideration of risks, benefits and other options for treatment, the patient has consented to  Procedure(s): RIGHT/LEFT HEART CATH AND CORONARY ANGIOGRAPHY (N/A) as a surgical intervention.  The patient's history has been reviewed, patient examined, no change in status, stable for surgery.  I have reviewed the patient's chart and labs.  Questions were answered to the patient's satisfaction.    Cath Lab Visit (complete for each Cath Lab visit)  Clinical Evaluation Leading to the Procedure:   ACS: No.  Non-ACS:    Anginal Classification: No Symptoms  Anti-ischemic medical therapy: No Therapy  Non-Invasive Test Results: No non-invasive testing performed  Prior CABG: No previous CABG        [name]

## 2020-12-29 NOTE — Progress Notes (Signed)
Pt arrived to unit via [facility]. Provider on call for Cardiology ( [facility], [facility]) paged as well as admitting for [facility]. VS stable and no s/s of distress noted at this time. Dierdre Highman, RN

## 2020-12-29 NOTE — ED Notes (Signed)
[facility] contacted for consults to cardiology and hospitalist

## 2020-12-29 NOTE — Consult Note (Signed)
Cardiology Consultation:   Patient ID: Jordon Bourquin MRN: [id number]; DOB: 1946-03-08  Admit date: 12/28/2020 Date of Consult: 12/29/2020  PCP:  Merryl Hacker, No   [facility] Providers Cardiologist:  Elouise Munroe, MD   Patient Profile:   Cypress Hinkson is a 75 y.o. female with a hx of anemia, remote tobacco abuse, severe hypothyroidism, hx of stroke, and calcified aberrant papillary muscle who is being seen 12/29/2020 for the evaluation of elevated troponin at the request of Dr. Tamala Julian.  History of Present Illness:   Ms. Reimers was recently hospitalized in September 2022 after being found sleeping and confused in her car.  She was brought to [facility] ED and code stroke was initiated.  Head CT showed no acute process but chronic appearing lacunar infarct.  MRI brain was attempted but was not completed early in the course due to agitation.  She was found to have severe hypothyroidism treated with IV Synthroid.  Course was also complicated with hyponatremia.  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  Otherwise no significant valvular issues.  Cardiology was consulted and she underwent TEE and cardiac MRI.  Cardiac MRI on 12/09/2016 showed an anterior lateral papillary muscle that was apically displaced and appeared bifid in the short axis.  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  No suggestion of malignancy.  AMS felt due to severe hypothyroidism with TSH 41. She was discharged to SNF and then discharged home approximately 3 days ago. Per her daughter, she had a "decline" and she was brought back to [place] for CP and increased lethargy with decreased PO intake. Workup significant for:   Leukocytosis with WBC 13.6 UA with nitrite  positive bacteria  Hb 8.7 (8.9) AKI with sCr 1.48, K 3.8 HS troponin 7447 --> 7263 BNP > 4500 AST 2238 ALT 1270 Alk phos 279 Ammonia WNL COVID negative  EKG with new LBBB  Hypotensive in the 90/70.  He is hypotensive and somewhat confused about why she is in the hospital. She denies chest pain and states she has "had a decline."   Past Medical History:  Diagnosis Date   Anemia    CVA (cerebral vascular accident) ([place])    Tobacco abuse, in remission     Past Surgical History:  Procedure Laterality Date   TEE WITHOUT CARDIOVERSION N/A 12/11/2020   Procedure: TRANSESOPHAGEAL ECHOCARDIOGRAM (TEE);  Surgeon: Josue Hector, MD;  Location: [facility] ENDOSCOPY;  Service: Cardiovascular;  Laterality: N/A;   TOTAL ABDOMINAL HYSTERECTOMY       Home Medications:  Prior to Admission medications   Medication Sig Start Date End Date Taking? Authorizing Provider  amLODipine (NORVASC) 5 MG tablet Take 1 tablet (5 mg total) by mouth daily. 12/13/20   Regalado, Jerald Kief A, MD  aspirin EC 81 MG EC tablet Take 1 tablet (81 mg total) by mouth daily. Swallow whole. 12/13/20   Regalado, Belkys A, MD  atorvastatin (LIPITOR) 80 MG tablet Take 1 tablet (80 mg total) by mouth daily. 12/13/20   Regalado, Belkys A, MD  bisacodyl (DULCOLAX) 10 MG suppository Place 1 suppository (10 mg total) rectally daily as needed for moderate constipation. 12/12/20   Regalado, Belkys A, MD  clopidogrel (PLAVIX) 75 MG tablet Take 1  tablet (75 mg total) by mouth daily. 12/13/20   Regalado, Belkys A, MD  levothyroxine (SYNTHROID) 100 MCG tablet Take 1 tablet (100 mcg total) by mouth daily at 6 (six) AM. 12/13/20   Regalado, Belkys A, MD  polyethylene glycol (MIRALAX / GLYCOLAX) 17 g packet Take 17 g by mouth 2 (two) times daily. 12/12/20   Regalado, Belkys A, MD  thiamine 100 MG tablet Take 1 tablet (100 mg total) by mouth daily. 12/13/20   Regalado, Belkys A, MD  vitamin B-12 100 MCG tablet Take 1 tablet (100 mcg total) by mouth daily.  12/13/20   Regalado, Jerald Kief A, MD    Inpatient Medications: Scheduled Meds:  Continuous Infusions:  cefTRIAXone (ROCEPHIN)  IV Stopped (12/29/20 0307)   heparin 850 Units/hr (12/29/20 0310)   PRN Meds:   Allergies:    Allergies  Allergen Reactions   Ciprofloxacin    Erythromycin    Penicillins    Ampicillin Rash    Mostly topical per daughter    Social History:   Social History   Socioeconomic History   Marital status: Divorced    Spouse name: Not on file   Number of children: Not on file   Years of education: Not on file   Highest education level: Not on file  Occupational History   Not on file  Tobacco Use   Smoking status: Former   Smokeless tobacco: Former   Tobacco comments:    Quit smoking in her late 61s  Substance and Sexual Activity   Alcohol use: Not Currently   Drug use: Never   Sexual activity: Not on file  Other Topics Concern   Not on file  Social History Narrative   Not on file   Social Determinants of Health   Financial Resource Strain: Not on file  Food Insecurity: Not on file  Transportation Needs: Not on file  Physical Activity: Not on file  Stress: Not on file  Social Connections: Not on file  Intimate Partner Violence: Not on file    Family History:    Family History  Problem Relation Age of Onset   Breast cancer Mother    Dementia Mother    Transient ischemic attack Mother    Parkinson's disease Father      ROS:  Please see the history of present illness.   All other ROS reviewed and negative.     Physical Exam/Data:   Vitals:   12/29/20 0200 12/29/20 0445 12/29/20 0500 12/29/20 0641  BP:  95/79 99/76 90/70   Pulse:  79 82 82  Resp:  (!) 35 (!) 35 20  Temp:    98 F (36.7 C)  TempSrc:    Oral  SpO2:  100% 97% 95%  Weight: 64 kg     Height:        Intake/Output Summary (Last 24 hours) at 12/29/2020 0709 Last data filed at 12/29/2020 0307 Gross per 24 hour  Intake 100 ml  Output --  Net 100 ml   Last 3  Weights 12/29/2020 12/05/2020 12/16/2014  Weight (lbs) 141 lb 141 lb 8.6 oz 158 lb  Weight (kg) 63.957 kg 64.2 kg 71.668 kg     Body mass index is 27.54 kg/m.  General:  elderly female with mild confusion, lethargic HEENT: normal Neck: no JVD Vascular: No carotid bruits; Distal pulses 2+ bilaterally Cardiac:  normal S1, S2; RRR; no murmur  Lungs:  clear to auscultation bilaterally, no wheezing, rhonchi or rales  Abd: soft, nontender, no hepatomegaly  Ext: no edema Musculoskeletal:  No deformities, BUE and BLE strength normal and equal Skin: warm and dry  Neuro:  CNs 2-12 intact, no focal abnormalities noted Psych:  Normal affect   EKG:  The EKG was personally reviewed and demonstrates:  sinus rhythm HR 87 new LBBB Telemetry:  Telemetry was personally reviewed and demonstrates:  sinus rhythm HR 80  Relevant CV Studies:  Echo 12/06/20: 1. 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 It measures approximately 13 mm in maximum  length and 5 mm in width, with a  relatively narrow attachment base. Left ventricular ejection fraction, by  estimation, is 60 to 65%. The left ventricle has normal function. The left  ventricle has no regional wall motion abnormalities. There is mild  concentric left ventricular  hypertrophy. Left ventricular diastolic parameters are consistent with  Grade I diastolic dysfunction (impaired relaxation). Elevated left atrial  pressure.   2. Right ventricular systolic function is normal. The right ventricular  size is normal.   3. The mitral valve is normal in structure. No evidence of mitral valve  regurgitation. No evidence of mitral stenosis.   4. The aortic valve is normal in structure. Aortic valve regurgitation is  not visualized. No aortic stenosis is present.   5. The inferior vena cava is normal in size with greater than 50%   respiratory variability, suggesting right atrial pressure of 3 mmHg.    TEE 12/11/20: 1. Extensive 3 D imaging Likely redundant calcified tertiarry mobile  chord seen in mid/apical LV cavity represents "mass" noted on previous  TTE/MRI.   2. Left ventricular ejection fraction, by estimation, is 60 to 65%. The  left ventricle has normal function. There is mild left ventricular  hypertrophy.   3. Right ventricular systolic function is normal. The right ventricular  size is normal.   4. Left atrial size was mildly dilated. No left atrial/left atrial  appendage thrombus was detected.   5. The mitral valve is normal in structure. No evidence of mitral valve  regurgitation.   6. The aortic valve is tricuspid. Aortic valve regurgitation is not  visualized. No aortic stenosis is present.    Cardiac MR 12/08/20: IMPRESSION: Significant artifact. Anterolateral papillary muscle appears apically displaced and appears bifid in the short axis. Rhabdomyoma and very small thrombus below the resolution of CMR cannot be excluded.   Laboratory Data:  High Sensitivity Troponin:   Recent Labs  Lab 12/29/20 0014 12/29/20 0143  TROPONINIHS 7,477* 7,263*     Chemistry Recent Labs  Lab 12/28/20 2036  NA 137  K 3.8  CL 104  CO2 19*  GLUCOSE 149*  BUN 64*  CREATININE 1.48*  CALCIUM 9.4  GFRNONAA 37*  ANIONGAP 14    Recent Labs  Lab 12/29/20 0014  PROT 6.7  ALBUMIN 3.7  AST 2,238*  ALT 1,270*  ALKPHOS 279*  BILITOT 1.9*   Lipids No results for input(s): CHOL, TRIG, HDL, LABVLDL, LDLCALC, CHOLHDL in the last 168 hours.  Hematology Recent Labs  Lab 12/28/20 2036  WBC 13.6*  RBC 2.69*  HGB 8.7*  HCT 27.2*  MCV 101.1*  MCH 32.3  MCHC 32.0  RDW 16.7*  PLT 360   Thyroid No results for input(s): TSH, FREET4 in the last 168 hours.  BNP Recent Labs  Lab 12/29/20 0014  BNP >4,500.0*    DDimer No results for input(s): DDIMER  in the last 168 hours.   Radiology/Studies:   CT HEAD WO CONTRAST (5MM)  Result Date: 12/28/2020 CLINICAL DATA:  Mental status change.  Unknown cause EXAM: CT HEAD WITHOUT CONTRAST TECHNIQUE: Contiguous axial images were obtained from the base of the skull through the vertex without intravenous contrast. COMPARISON:  CT head 12/05/2020 BRAIN: BRAIN Cerebral ventricle sizes are concordant with the degree of cerebral volume loss. Patchy and confluent areas of decreased attenuation are noted throughout the deep and periventricular white matter of the cerebral hemispheres bilaterally, compatible with chronic microvascular ischemic disease. No evidence of large-territorial acute infarction. No parenchymal hemorrhage. No mass lesion. No extra-axial collection. No mass effect or midline shift. No hydrocephalus. Basilar cisterns are patent. Vascular: No hyperdense vessel. Atherosclerotic calcifications are present within the cavernous internal carotid arteries. Skull: No acute fracture or focal lesion. Sinuses/Orbits: Paranasal sinuses and mastoid air cells are clear. The orbits are unremarkable. Other: None. IMPRESSION: No acute intracranial abnormality. Electronically Signed   By: Iven Finn M.D.   On: 12/28/2020 23:58   DG Chest Port 1 View  Result Date: 12/28/2020 CLINICAL DATA:  returned home Friday from rehab after adm. To hospital DX CVA , pt is unable to walk without assistance and has declined in care in 3 days that she has been home, decreased po intake, unable to care for herself EXAM: PORTABLE CHEST 1 VIEW COMPARISON:  None. FINDINGS: The heart and mediastinal contours are within normal limits. Aortic calcification. No focal consolidation. No pulmonary edema. Blunting of bilateral costophrenic angles with likely trace pleural effusions. No pneumothorax. No acute osseous abnormality. IMPRESSION: 1. Blunting of bilateral costophrenic angles with likely trace pleural effusions. 2.  Aortic Atherosclerosis (ICD10-I70.0). Electronically Signed   By:  Iven Finn M.D.   On: 12/28/2020 23:55     Assessment and Plan:   Elevated troponin Elevated BNP - recent echo with preserved LVEF - will obtain stat echo - pt denies chest pain, but mildly confused - troponin elevation  - unclear if this is demand vs ischemic process - will need to treat UTI prior to potential heart catheterization - echo with guide next steps   LBBB - new - obtain stat echo   UTI ?Septic shock - hypotension with AKI and elevated LFTs - will defer to primary for treatment of nitrite positive bacteria - no urine culture was collected prior to ABX - will add lactic acid and procalcitonin - hold anti-hypertensives     Risk Assessment/Risk Scores:       For questions or updates, please contact [facility] Please consult [email or web address] for contact info under    Signed, Ledora Bottcher, PA  12/29/2020 7:09 AM

## 2020-12-29 NOTE — Progress Notes (Signed)
Echocardiogram 2D Echocardiogram has been performed.  Warren Lacy Ariona Deschene RDCS  Notified Dr. Anne Fu

## 2020-12-30 ENCOUNTER — Encounter (HOSPITAL_COMMUNITY): Payer: Self-pay | Admitting: Cardiology

## 2020-12-30 DIAGNOSIS — I214 Non-ST elevation (NSTEMI) myocardial infarction: Secondary | ICD-10-CM | POA: Diagnosis not present

## 2020-12-30 DIAGNOSIS — R570 Cardiogenic shock: Secondary | ICD-10-CM | POA: Diagnosis not present

## 2020-12-30 LAB — CBC
HCT: 28.1 % — ABNORMAL LOW (ref 36.0–46.0)
Hemoglobin: 8.9 g/dL — ABNORMAL LOW (ref 12.0–15.0)
MCH: 30.4 pg (ref 26.0–34.0)
MCHC: 31.7 g/dL (ref 30.0–36.0)
MCV: 95.9 fL (ref 80.0–100.0)
Platelets: 174 10*3/uL (ref 150–400)
RBC: 2.93 MIL/uL — ABNORMAL LOW (ref 3.87–5.11)
RDW: 17.1 % — ABNORMAL HIGH (ref 11.5–15.5)
WBC: 9 10*3/uL (ref 4.0–10.5)
nRBC: 9.7 % — ABNORMAL HIGH (ref 0.0–0.2)

## 2020-12-30 LAB — BASIC METABOLIC PANEL
Anion gap: 3 — ABNORMAL LOW (ref 5–15)
Anion gap: 4 — ABNORMAL LOW (ref 5–15)
BUN: 52 mg/dL — ABNORMAL HIGH (ref 8–23)
BUN: 49 mg/dL — ABNORMAL HIGH (ref 8–23)
CO2: 29 mmol/L (ref 22–32)
CO2: 27 mmol/L (ref 22–32)
Calcium: 8.3 mg/dL — ABNORMAL LOW (ref 8.9–10.3)
Calcium: 8.2 mg/dL — ABNORMAL LOW (ref 8.9–10.3)
Chloride: 110 mmol/L (ref 98–111)
Chloride: 110 mmol/L (ref 98–111)
Creatinine, Ser: 1.13 mg/dL — ABNORMAL HIGH (ref 0.44–1.00)
Creatinine, Ser: 1.07 mg/dL — ABNORMAL HIGH (ref 0.44–1.00)
GFR, Estimated: 51 mL/min — ABNORMAL LOW (ref >60)
GFR, Estimated: 55 mL/min — ABNORMAL LOW (ref >60)
Glucose, Bld: 148 mg/dL — ABNORMAL HIGH (ref 70–99)
Glucose, Bld: 120 mg/dL — ABNORMAL HIGH (ref 70–99)
Potassium: 3.4 mmol/L — ABNORMAL LOW (ref 3.5–5.1)
Potassium: 4.2 mmol/L (ref 3.5–5.1)
Sodium: 142 mmol/L (ref 135–145)
Sodium: 141 mmol/L (ref 135–145)

## 2020-12-30 LAB — MAGNESIUM
Magnesium: 2.6 mg/dL — ABNORMAL HIGH (ref 1.7–2.4)
Magnesium: 2.6 mg/dL — ABNORMAL HIGH (ref 1.7–2.4)

## 2020-12-30 LAB — HEPARIN LEVEL (UNFRACTIONATED)
Heparin Unfractionated: 0.14 IU/mL — ABNORMAL LOW (ref 0.30–0.70)
Heparin Unfractionated: 0.17 IU/mL — ABNORMAL LOW (ref 0.30–0.70)
Heparin Unfractionated: 0.2 IU/mL — ABNORMAL LOW (ref 0.30–0.70)

## 2020-12-30 LAB — COMPREHENSIVE METABOLIC PANEL
ALT: 958 U/L — ABNORMAL HIGH (ref 0–44)
AST: 1275 U/L — ABNORMAL HIGH (ref 15–41)
Albumin: 2.9 g/dL — ABNORMAL LOW (ref 3.5–5.0)
Alkaline Phosphatase: 199 U/L — ABNORMAL HIGH (ref 38–126)
Anion gap: 7 (ref 5–15)
BUN: 60 mg/dL — ABNORMAL HIGH (ref 8–23)
CO2: 28 mmol/L (ref 22–32)
Calcium: 8.2 mg/dL — ABNORMAL LOW (ref 8.9–10.3)
Chloride: 108 mmol/L (ref 98–111)
Creatinine, Ser: 1.2 mg/dL — ABNORMAL HIGH (ref 0.44–1.00)
GFR, Estimated: 48 mL/min — ABNORMAL LOW (ref >60)
Glucose, Bld: 116 mg/dL — ABNORMAL HIGH (ref 70–99)
Potassium: 2.8 mmol/L — ABNORMAL LOW (ref 3.5–5.1)
Sodium: 143 mmol/L (ref 135–145)
Total Bilirubin: 2.6 mg/dL — ABNORMAL HIGH (ref 0.3–1.2)
Total Protein: 5.3 g/dL — ABNORMAL LOW (ref 6.5–8.1)

## 2020-12-30 LAB — COOXEMETRY PANEL
Carboxyhemoglobin: 2.1 % — ABNORMAL HIGH (ref 0.5–1.5)
Methemoglobin: 1 % (ref 0.0–1.5)
O2 Saturation: 59.6 %
Total hemoglobin: 8.8 g/dL — ABNORMAL LOW (ref 12.0–16.0)

## 2020-12-30 LAB — PROCALCITONIN: Procalcitonin: 3.88 ng/mL

## 2020-12-30 LAB — URINE CULTURE: Culture: NO GROWTH

## 2020-12-30 MED ORDER — POTASSIUM CHLORIDE CRYS ER 20 MEQ PO TBCR
40.0000 meq | EXTENDED_RELEASE_TABLET | ORAL | Status: DC
Start: 2020-12-30 — End: 2020-12-30

## 2020-12-30 MED ORDER — POTASSIUM CHLORIDE CRYS ER 20 MEQ PO TBCR
40.0000 meq | EXTENDED_RELEASE_TABLET | Freq: Once | ORAL | Status: AC
  Administered 2020-12-30: 40 meq via ORAL
  Filled 2020-12-30: qty 2

## 2020-12-30 MED ORDER — HEPARIN (PORCINE) 25000 UT/250ML-% IV SOLN
300.0000 [IU]/h | INTRAVENOUS | Status: DC
  Administered 2020-12-30: 150 [IU]/h via INTRAVENOUS
  Filled 2020-12-30: qty 250

## 2020-12-30 MED ORDER — POTASSIUM CHLORIDE CRYS ER 20 MEQ PO TBCR: 20.0000 meq | EXTENDED_RELEASE_TABLET | Freq: Once | ORAL | Status: DC

## 2020-12-30 MED ORDER — POTASSIUM CHLORIDE 10 MEQ/50ML IV SOLN: 10.0000 meq | INTRAVENOUS | Status: DC

## 2020-12-30 MED ORDER — CHLORHEXIDINE GLUCONATE 0.12 % MT SOLN
15.0000 mL | Freq: Two times a day (BID) | OROMUCOSAL | Status: DC
  Administered 2020-12-30 – 2021-01-11 (×21): 15 mL via OROMUCOSAL
  Filled 2020-12-30 (×18): qty 15

## 2020-12-30 MED ORDER — POTASSIUM CHLORIDE CRYS ER 20 MEQ PO TBCR: 40.0000 meq | EXTENDED_RELEASE_TABLET | Freq: Once | ORAL | Status: DC

## 2020-12-30 MED ORDER — ORAL CARE MOUTH RINSE
15.0000 mL | Freq: Two times a day (BID) | OROMUCOSAL | Status: DC
  Administered 2020-12-30 – 2021-01-10 (×18): 15 mL via OROMUCOSAL

## 2020-12-30 MED ORDER — ENSURE ENLIVE PO LIQD
237.0000 mL | Freq: Two times a day (BID) | ORAL | Status: DC
  Administered 2020-12-30 – 2021-01-10 (×17): 237 mL via ORAL

## 2020-12-30 MED ORDER — LOSARTAN POTASSIUM 25 MG PO TABS
12.5000 mg | ORAL_TABLET | Freq: Two times a day (BID) | ORAL | Status: DC
  Administered 2020-12-30 – 2021-01-04 (×10): 12.5 mg via ORAL
  Filled 2020-12-30 (×10): qty 1

## 2020-12-30 MED ORDER — POTASSIUM CHLORIDE CRYS ER 20 MEQ PO TBCR
40.0000 meq | EXTENDED_RELEASE_TABLET | ORAL | Status: AC
Start: 2020-12-30 — End: 2020-12-30
  Administered 2020-12-30 (×2): 40 meq via ORAL
  Filled 2020-12-30 (×2): qty 2

## 2020-12-30 MED ORDER — SPIRONOLACTONE 12.5 MG HALF TABLET
12.5000 mg | ORAL_TABLET | Freq: Every day | ORAL | Status: DC
  Administered 2020-12-30 – 2021-01-05 (×6): 12.5 mg via ORAL
  Filled 2020-12-30 (×6): qty 1

## 2020-12-30 MED ORDER — DIGOXIN 125 MCG PO TABS
0.1250 mg | ORAL_TABLET | Freq: Every day | ORAL | Status: DC
  Administered 2020-12-30 – 2021-01-11 (×12): 0.125 mg via ORAL
  Filled 2020-12-30 (×12): qty 1

## 2020-12-30 MED ORDER — POTASSIUM CHLORIDE 10 MEQ/50ML IV SOLN
10.0000 meq | INTRAVENOUS | Status: AC
  Administered 2020-12-30 (×3): 10 meq via INTRAVENOUS
  Filled 2020-12-30 (×3): qty 50

## 2020-12-30 MED FILL — Norepinephrine-Dextrose IV Solution 4 MG/250ML-5%: INTRAVENOUS | Qty: 250 | Status: AC

## 2020-12-30 NOTE — Progress Notes (Signed)
ANTICOAGULATION CONSULT NOTE  Pharmacy Consult for heparin Indication:  Impella  Allergies  Allergen Reactions   Ciprofloxacin    Erythromycin    Penicillins    Ampicillin Rash    Mostly topical per daughter    Patient Measurements: Height: 5' (152.4 cm) Weight: 64 kg (141 lb) IBW/kg (Calculated) : 45.5 Heparin Dosing Weight: 64kg  Vital Signs: Temp: 99 F (37.2 C) (09/28 0500) Temp Source: Core (09/27 2015) BP: 117/88 (09/28 0500) Pulse Rate: 76 (09/28 0500)  Labs: Recent Labs    12/28/20 2036 12/29/20 0014 12/29/20 0143 12/29/20 0714 12/29/20 1038 12/29/20 1227 12/29/20 1520 12/29/20 2116 12/30/20 0423  HGB 8.7*  --   --  7.4*   < > 5.8* 6.5*  --  8.9*  HCT 27.2*  --   --  23.8*   < > 17.0* 20.8*  --  28.1*  PLT 360  --   --  270  --   --  243  --  174  HEPARINUNFRC  --   --   --   --   --   --   --  0.28* 0.14*  CREATININE 1.48*  --   --  1.66*  --   --   --   --   --   TROPONINIHS  --  7,477* 7,263*  --   --   --   --   --   --    < > = values in this interval not displayed.     Estimated Creatinine Clearance: 24.8 mL/min (A) (by C-G formula based on SCr of 1.66 mg/dL (H)).   Assessment: 34 yoF admitted w/ CP s/p LHC with Impella CP placement. Pharmacy consulted for systemic heparin management.  Pt with some oozing post-cath - discussed with Dr. [place], will begin heparinized purge and hold systemic heparin x4h. At that point, once ACT <160 - will add low-dose systemic heparin as needed.  ACT < 160 and able to start systemic heparin per protocol; however, heparin level is also therapeutic at 0.28 units/mL from the purge solution.  Patient continues to have bleeding/oozing throughout the evening and it has now stabilized.  RN reports there were 3 hematomas (groin, ART line and TR band) that also stabilized.  RN had to inflate TR band to stop/reduce the bleeding.  Heparin level came back at 0.14 this AM. Current rate of heparin in the purge is 850  units/hr. We will start low dose systemic heparin in addition to the purge.   Goal of Therapy:  Heparin level 0.2-0.5 units/ml Monitor platelets by anticoagulation protocol: Yes   Plan:  Heparinized purge 850 units/hr Start systemic heparin at 150 units/hr Check 8 hr heparin level  Ulyses Southward, PharmD, [place], AAHIVP, CPP Infectious Disease Pharmacist 12/30/2020 5:17 AM

## 2020-12-30 NOTE — Progress Notes (Addendum)
ANTICOAGULATION CONSULT NOTE - Follow Up Consult  Pharmacy Consult for heparin Indication:  Impella  Labs: Recent Labs    12/29/20 0014 12/29/20 0143 12/29/20 0714 12/29/20 1038 12/29/20 1227 12/29/20 1520 12/29/20 2116 12/30/20 0423 12/30/20 1255 12/30/20 1354 12/30/20 2152  HGB  --   --  7.4*   < > 5.8* 6.5*  --  8.9*  --   --   --   HCT  --   --  23.8*   < > 17.0* 20.8*  --  28.1*  --   --   --   PLT  --   --  270  --   --  243  --  174  --   --   --   HEPARINUNFRC  --   --   --   --   --   --    < > 0.14* 0.17*  --  0.20*  CREATININE  --   --  1.66*  --   --   --   --  1.20*  --  1.13* 1.07*  TROPONINIHS 7,477* 7,263*  --   --   --   --   --   --   --   --   --    < > = values in this interval not displayed.    Assessment/Plan:  75yo female therapeutic on heparin after rate change. Will continue systemic infusion at current rate of 200 units/hr (plus purge heparin at ~900 units/hr) and confirm stable with am labs.  Vernard Gambles, PharmD, BCPS 12/30/2020,11:02 PM   Addendum: Heparin level this am slightly below goal at 0.17; RN reports some bleeding from nose/mouth with some oozing at Impella site.  Will hold off on changing rate and recheck level later this am.  VB 12/31/2020 6:07 AM

## 2020-12-30 NOTE — TOC Initial Note (Addendum)
Transition of Care [facility]) - Initial/Assessment Note    Patient Details  Name: Jane Navarro MRN: [id number] Date of Birth: 1945/08/15  [facility] CM/SW Contact:    Elliot Cousin, RN Phone Number: [phone] 12/30/2020, 4:27 PM  Clinical Narrative:                 HF TOC CM spoke to pt and sons at bedside. States she came home from [facility] on 12/25/2020 after rehab and had HH arranged. Pt is active with [facility]. Notification sent to [facility] rep, Stacie. Pt states she would go back to [facility] for rehab if needed. Pt has all needed DME in the home. Will follow to see if oxygen needed for home. Waiting PT recommendations.  Expected Discharge Plan: Home w Home Health Services Barriers to Discharge: Continued Medical Work up   Patient Goals and CMS Choice        Expected Discharge Plan and Services Expected Discharge Plan: Home w Home Health Services In-house Referral: Clinical Social Work Discharge Planning Services: CM Consult   Living arrangements for the past 2 months: Single Family Home                                      Prior Living Arrangements/Services Living arrangements for the past 2 months: Single Family Home Lives with:: Self Patient language and need for interpreter reviewed:: Yes Do you feel safe going back to the place where you live?: Yes      Need for Family Participation in Patient Care: Yes (Comment) Care giver support system in place?: Yes (comment) Current home services: DME (rolling walker with seat, bedside commode, wheelchair) Criminal Activity/Legal Involvement Pertinent to Current Situation/Hospitalization: No - Comment as needed  Activities of Daily Living Home Assistive Devices/Equipment: Walker (specify type) ADL Screening (condition at time of admission) Patient's cognitive ability adequate to safely complete daily activities?: No Is the patient deaf or have difficulty hearing?: No Does the patient  have difficulty seeing, even when wearing glasses/contacts?: No Does the patient have difficulty concentrating, remembering, or making decisions?: No Patient able to express need for assistance with ADLs?: Yes Does the patient have difficulty dressing or bathing?: Yes Independently performs ADLs?: No Communication: Independent Dressing (OT): Needs assistance Is this a change from baseline?: Pre-admission baseline Grooming: Needs assistance Is this a change from baseline?: Pre-admission baseline Feeding: Independent Bathing: Needs assistance Is this a change from baseline?: Pre-admission baseline Toileting: Needs assistance Is this a change from baseline?: Pre-admission baseline In/Out Bed: Independent Walks in Home: Needs assistance Is this a change from baseline?: Pre-admission baseline Does the patient have difficulty walking or climbing stairs?: Yes Weakness of Legs: Both Weakness of Arms/Hands: None  Permission Sought/Granted Permission sought to share information with : Case Manager, Family Supports, PCP Permission granted to share information with : Yes, Verbal Permission Granted  Share Information with NAME: Georgeanna Lea     Permission granted to share info w Relationship: Adult Children  Permission granted to share info w Contact Information: [phone]  Emotional Assessment Appearance:: Appears stated age Attitude/Demeanor/Rapport: Gracious, Lethargic Affect (typically observed): Pleasant Orientation: : Oriented to Self, Oriented to Place, Oriented to  Time, Oriented to Situation   Psych Involvement: No (comment)  Admission diagnosis:  Altered mental status [R41.82] Transaminitis [R74.01] NSTEMI (non-ST elevated myocardial infarction) (HCC) [I21.4] Acute cystitis without hematuria [N30.00] Acute heart failure, unspecified heart  failure type [facility] [I50.9] Patient Active Problem List   Diagnosis Date Noted   NSTEMI (non-ST elevated myocardial infarction) (HCC)  12/29/2020   Leukocytosis 12/29/2020   Macrocytic anemia 12/29/2020   AKI (acute kidney injury) (HCC) 12/29/2020   Urinary tract infection 12/29/2020   Weakness 12/29/2020   SIRS (systemic inflammatory response syndrome) (HCC) 12/29/2020   Cerebral thrombosis with cerebral infarction 12/09/2020   Acute metabolic encephalopathy 12/05/2020   Hypertensive urgency 12/05/2020   CKD (chronic kidney disease), stage III (HCC) 12/05/2020   Normocytic anemia 12/05/2020   Transaminitis 12/05/2020   PCP:  Pcp, No Pharmacy:   [id number] [place], [place] - [address] AT [facility] [address] [place] [address] Phone: [phone] Fax: [phone]     Social Determinants of Health (SDOH) Interventions Housing Interventions: Intervention Not Indicated  Readmission Risk Interventions No flowsheet data found.

## 2020-12-30 NOTE — Plan of Care (Signed)
  Problem: Pain Managment: Goal: General experience of comfort will improve Outcome: Progressing   Problem: Skin Integrity: Goal: Risk for impaired skin integrity will decrease Outcome: Progressing   Problem: Cardiac: Goal: Ability to maintain an adequate cardiac output will improve Outcome: Progressing   Problem: Respiratory: Goal: Will regain and/or maintain adequate ventilation Outcome: Progressing

## 2020-12-30 NOTE — TOC Initial Note (Addendum)
Transition of Care [facility]) - Initial/Assessment Note    Patient Details  Name: Jane Navarro MRN: [id number] Date of Birth: [birth year]  Transition of Care [facility]) CM/SW Contact:    Tiffanye Hartmann, LCSWA Phone Number: 12/30/2020, 4:19 PM  Clinical Narrative:                 HF CSW and HF RNCM spoke with Jane Navarro and her sons, Jane Navarro and Jane Navarro at bedside to discuss disposition planning. Patients sons reported that their mom was just at [facility] for rehab and was just back home for only 3 days and then had a heart attack. Jane Navarro reported that she didn't mind [facility] and really enjoyed PT and OT there and would go back if needed.  CSW reached out to patients daughter, Jane Navarro with consent from Jane Navarro. Jane Navarro reported that [facility] was the home health agency provider and that Jane Navarro has not had home oxygen before.  CSW will continue to follow throughout discharge    Barriers to Discharge: Continued Medical Work up   Patient Goals and CMS Choice        Expected Discharge Plan and Services   In-house Referral: Clinical Social Work Discharge Planning Services: CM Consult   Living arrangements for the past 2 months: Single Family Home                                      Prior Living Arrangements/Services Living arrangements for the past 2 months: Single Family Home Lives with:: Self Patient language and need for interpreter reviewed:: Yes        Need for Family Participation in Patient Care: No (Comment) Care giver support system in place?: No (comment)   Criminal Activity/Legal Involvement Pertinent to Current Situation/Hospitalization: No - Comment as needed  Activities of Daily Living Home Assistive Devices/Equipment: Walker (specify type) ADL Screening (condition at time of admission) Patient's cognitive ability adequate to safely complete daily activities?: No Is the patient deaf or have difficulty hearing?: No Does the patient have difficulty  seeing, even when wearing glasses/contacts?: No Does the patient have difficulty concentrating, remembering, or making decisions?: No Patient able to express need for assistance with ADLs?: Yes Does the patient have difficulty dressing or bathing?: Yes Independently performs ADLs?: No Communication: Independent Dressing (OT): Needs assistance Is this a change from baseline?: Pre-admission baseline Grooming: Needs assistance Is this a change from baseline?: Pre-admission baseline Feeding: Independent Bathing: Needs assistance Is this a change from baseline?: Pre-admission baseline Toileting: Needs assistance Is this a change from baseline?: Pre-admission baseline In/Out Bed: Independent Walks in Home: Needs assistance Is this a change from baseline?: Pre-admission baseline Does the patient have difficulty walking or climbing stairs?: Yes Weakness of Legs: Both Weakness of Arms/Hands: None  Permission Sought/Granted   Permission granted to share information with : Yes, Verbal Permission Granted  Share Information with NAME: Jane Navarro     Permission granted to share info w Relationship: Adult Children     Emotional Assessment Appearance:: Appears stated age Attitude/Demeanor/Rapport: Lethargic Affect (typically observed): Quiet Orientation: : Oriented to Self, Oriented to Place, Oriented to  Time   Psych Involvement: No (comment)  Admission diagnosis:  Altered mental status [R41.82] Transaminitis [R74.01] NSTEMI (non-ST elevated myocardial infarction) (HCC) [I21.4] Acute cystitis without hematuria [N30.00] Acute heart failure, unspecified heart failure type [facility]) [I50.9] Patient Active Problem List   Diagnosis Date  Noted   NSTEMI (non-ST elevated myocardial infarction) (HCC) 12/29/2020   Leukocytosis 12/29/2020   Macrocytic anemia 12/29/2020   AKI (acute kidney injury) (HCC) 12/29/2020   Urinary tract infection 12/29/2020   Weakness 12/29/2020   SIRS (systemic  inflammatory response syndrome) (HCC) 12/29/2020   Cerebral thrombosis with cerebral infarction 12/09/2020   Acute metabolic encephalopathy 12/05/2020   Hypertensive urgency 12/05/2020   CKD (chronic kidney disease), stage III (HCC) 12/05/2020   Normocytic anemia 12/05/2020   Transaminitis 12/05/2020   PCP:  Pcp, No Pharmacy:   [id number] [place], [place] - [address] AT [facility] [address] [place] [address] Phone: [phone] Fax: [phone]     Social Determinants of Health (SDOH) Interventions Housing Interventions: Intervention Not Indicated  Readmission Risk Interventions No flowsheet data found.  Jane Navarro, MSW, LCSWA [phone] Heart Failure Social Worker

## 2020-12-30 NOTE — Progress Notes (Addendum)
ANTICOAGULATION CONSULT NOTE  Pharmacy Consult for heparin Indication:  Impella  Allergies  Allergen Reactions   Ciprofloxacin    Erythromycin    Penicillins    Ampicillin Rash    Mostly topical per daughter    Patient Measurements: Height: 5' (152.4 cm) Weight: 57.6 kg (126 lb 15.8 oz) IBW/kg (Calculated) : 45.5 Heparin Dosing Weight: 58 kg  Vital Signs: Temp: 99.1 F (37.3 C) (09/28 1400) Temp Source: Core (09/28 1237) BP: 103/62 (09/28 1400) Pulse Rate: 81 (09/28 1400)  Labs: Recent Labs    12/28/20 2036 12/29/20 0014 12/29/20 0143 12/29/20 0714 12/29/20 1038 12/29/20 1227 12/29/20 1520 12/29/20 2116 12/30/20 0423 12/30/20 1255  HGB 8.7*  --   --  7.4*   < > 5.8* 6.5*  --  8.9*  --   HCT 27.2*  --   --  23.8*   < > 17.0* 20.8*  --  28.1*  --   PLT 360  --   --  270  --   --  243  --  174  --   HEPARINUNFRC  --   --   --   --   --   --   --  0.28* 0.14* 0.17*  CREATININE 1.48*  --   --  1.66*  --   --   --   --  1.20*  --   TROPONINIHS  --  7,477* 7,263*  --   --   --   --   --   --   --    < > = values in this interval not displayed.     Estimated Creatinine Clearance: 32.7 mL/min (A) (by C-G formula based on SCr of 1.2 mg/dL (H)).   Assessment: 69 yoF admitted w/ CP s/p LHC with Impella CP placement. Pharmacy consulted for systemic heparin management.  Pt with some oozing post-cath, now resolved.   Heparin level came back at 0.17 this AM. Current rate of heparin in the purge flow is 17.8 mL/hour (890 units/hr). Concentration of the purge solution is 50 units/mL. Systemic heparin was started at 150 units/hour this morning. Total heparin rate is currently 940 units/hour. CBC stable with no signs or symptoms of bleeding. Platelets are trending down, likely due to impella support. Tentative plans for cath on 9/30 pending clinical improvement.   Goal of Therapy:  Heparin level 0.2-0.5 units/ml Monitor platelets by anticoagulation protocol: Yes   Plan:   Heparinized purge currently 17.8 mL/hour (890 units/hr) Increase systemic heparin to 200 units/hr Check 6 hr heparin level  Thank you for allowing pharmacy to participate in this patient's care.  Enos Fling, PharmD PGY1 Pharmacy Resident 12/30/2020 2:39 PM Check [email or web address] for unit specific pharmacy number

## 2020-12-30 NOTE — Progress Notes (Addendum)
Advanced Heart Failure Rounding Note  PCP-Cardiologist: Parke Poisson, MD   Subjective:   9/27 NSTEMI-->Cardiogenic shock. Impella CP placed and started on milrinone. Given 2 UPRBCs. Milrinone increased to 0.375 mcg.   Swan#s ---> Impella CP + milrinone 0.375 mcg  Impella @ P8  flow 3.5  CPO  0.8  CVP 4 PA 23/13 PWP 11 SVR 1862 CO 3.8 CI 2.35  CO-OX 60%   Denies chest pain. Denies SOB.    Objective:   Weight Range: 57.6 kg Body mass index is 24.8 kg/m.   Vital Signs:   Temp:  [98.3 F (36.8 C)-99.7 F (37.6 C)] 99 F (37.2 C) (09/28 0600) Pulse Rate:  [65-100] 78 (09/28 0600) Resp:  [5-38] 21 (09/28 0600) BP: (88-140)/(61-106) 119/71 (09/28 0600) SpO2:  [92 %-100 %] 94 % (09/28 0600) Arterial Line BP: (111-139)/(76-98) 127/89 (09/27 1830) Weight:  [57.6 kg] 57.6 kg (09/28 0500) Last BM Date: 12/28/20  Weight change: Filed Weights   12/29/20 0200 12/30/20 0500  Weight: 64 kg 57.6 kg    Intake/Output:   Intake/Output Summary (Last 24 hours) at 12/30/2020 0722 Last data filed at 12/30/2020 0700 Gross per 24 hour  Intake 1447.53 ml  Output 890 ml  Net 557.53 ml      Physical Exam   CVp 4 General:  . No resp difficulty HEENT: Poor dentition  Neck: Supple. JVP flat . Carotids 2+ bilat; no bruits. No lymphadenopathy or thyromegaly appreciated.RIJ swan  Cor: PMI nondisplaced. Regular rate & rhythm. No rubs, gallops or murmurs. Lungs:Crackles in the bases Abdomen: Soft, nontender, nondistended. No hepatosplenomegaly. No bruits or masses. Good bowel sounds. Extremities: No cyanosis, clubbing, rash, edema. R groin impella . RLE immobilizer. Hematoma soft. LUE hematoma soft.  Neuro: Alert & orientedx3, cranial nerves grossly intact. moves all 4 extremities w/o difficulty. Affect pleasant GU: foley    Telemetry  SR 80s personally reviewed  EKG    N/A   Labs    CBC Recent Labs    12/29/20 0714 12/29/20 1038 12/29/20 1520 12/30/20 0423   WBC 12.0*  --  10.4 9.0  NEUTROABS 10.0*  --   --   --   HGB 7.4*   < > 6.5* 8.9*  HCT 23.8*   < > 20.8* 28.1*  MCV 102.1*  --  101.5* 95.9  PLT 270  --  243 174   < > = values in this interval not displayed.   Basic Metabolic Panel Recent Labs    16/10/96 0714 12/29/20 1038 12/29/20 1227 12/30/20 0423  NA 139   < > 147* 143  K 3.8   < > 3.3* 2.8*  CL 105  --   --  108  CO2 19*  --   --  28  GLUCOSE 109*  --   --  116*  BUN 72*  --   --  60*  CREATININE 1.66*  --   --  1.20*  CALCIUM 9.1  --   --  8.2*  MG 2.7*  --   --   --    < > = values in this interval not displayed.   Liver Function Tests Recent Labs    12/29/20 0014 12/30/20 0423  AST 2,238* 1,275*  ALT 1,270* 958*  ALKPHOS 279* 199*  BILITOT 1.9* 2.6*  PROT 6.7 5.3*  ALBUMIN 3.7 2.9*   No results for input(s): LIPASE, AMYLASE in the last 72 hours. Cardiac Enzymes No results for input(s): CKTOTAL, CKMB, CKMBINDEX, TROPONINI  in the last 72 hours.  BNP: BNP (last 3 results) Recent Labs    12/29/20 0014  BNP >4,500.0*    ProBNP (last 3 results) No results for input(s): PROBNP in the last 8760 hours.   D-Dimer No results for input(s): DDIMER in the last 72 hours. Hemoglobin A1C No results for input(s): HGBA1C in the last 72 hours. Fasting Lipid Panel No results for input(s): CHOL, HDL, LDLCALC, TRIG, CHOLHDL, LDLDIRECT in the last 72 hours. Thyroid Function Tests Recent Labs    12/29/20 0014  TSH 4.818*    Other results:   Imaging    CARDIAC CATHETERIZATION  Result Date: 12/29/2020   Ost LAD lesion is 70% stenosed.   Mid LAD lesion is 95% stenosed.   Prox LAD lesion is 95% stenosed.   Dist LAD lesion is 30% stenosed.   Dist LM to Ost LAD lesion is 50% stenosed.   Ost Cx lesion is 50% stenosed.   1st Mrg lesion is 50% stenosed.   Mid RCA lesion is 90% stenosed.   RPAV lesion is 30% stenosed with 30% stenosed side branch in 3rd RPL.   RPDA lesion is 20% stenosed.   LV end diastolic  pressure is moderately elevated.  Severe left main and multivessel disease with proximal LAD and mid RCA lesions. Cardiogenic shock requiring Impella placement; placement was associated with hematoma so Femstop was also placed.  LVEDP was elevated at with PA pulsatility index of 1.  Fick CI was 3.07 L/min/m2 and average thermodilution CI was 1.58 L/min/m2. Successful Swan-ganz catheter placement in RIJ and left radial arterial line placement. Recommendations:  Continue medical treatment for cardiogenic shock; revascularization options will be considered based on clinical course.   ECHOCARDIOGRAM COMPLETE  Result Date: 12/29/2020    ECHOCARDIOGRAM REPORT   Patient Name:   KIERSTIN JANUARY Date of Exam: 12/29/2020 Medical Rec #:  [id number]    Height:       60.0 in Accession #:    [id number]   Weight:       141.0 lb Date of Birth:  Sep 30, 1945    BSA:          1.609 m Patient Age:    75 years     BP:           90/70 mmHg Patient Gender: F            HR:           81 bpm. Exam Location:  Inpatient Procedure: 2D Echo, 3D Echo, Color Doppler and Cardiac Doppler                                    STAT ECHO   New reduction in RV and LV function with focal wall motion abnormalities. I am                              the ordering provider. Indications:    NSTEMI  History:        Patient has prior history of Echocardiogram examinations, most                 recent 12/11/2020. History of LV mass.  Sonographer:    Irving Burton Senior RDCS Referring Phys: [id number] [facility] NICOLE DUKE  Sonographer Comments: Patient unable to stay on left side IMPRESSIONS  1. Left ventricular ejection fraction, by estimation, is  25 to 30%. The left ventricle has severely decreased function. The left ventricle demonstrates regional wall motion abnormalities (see scoring diagram/findings for description). There is mild left  ventricular hypertrophy. Left ventricular diastolic parameters are consistent with Grade II diastolic dysfunction  (pseudonormalization). Elevated left ventricular end-diastolic pressure.  2. Right ventricular systolic function is severely reduced. The right ventricular size is moderately enlarged. There is mildly elevated pulmonary artery systolic pressure.  3. Left atrial size was moderately dilated.  4. Right atrial size was mild to moderately dilated.  5. The mitral valve is normal in structure. Moderate mitral valve regurgitation. No evidence of mitral stenosis.  6. Tricuspid valve regurgitation is moderate to severe.  7. The aortic valve is tricuspid. There is mild calcification of the aortic valve. Aortic valve regurgitation is not visualized. Mild aortic valve sclerosis is present, with no evidence of aortic valve stenosis.  8. The inferior vena cava is dilated in size with <50% respiratory variability, suggesting right atrial pressure of 15 mmHg. Comparison(s): Changes from prior study are noted. Conclusion(s)/Recommendation(s): Changes from echo. Reduced LV and RV function with focal wall motion abnormalities concerning for ischemia. 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 Left ventricular ejection fraction, by estimation, is 25 to 30%. The left ventricle has severely decreased function. The left ventricle demonstrates regional wall motion abnormalities. The left ventricular internal cavity size was normal in size. There is mild left ventricular hypertrophy. Left ventricular diastolic parameters are consistent with Grade II diastolic dysfunction (pseudonormalization). Elevated left ventricular end-diastolic pressure.  LV Wall Scoring: The inferior septum, entire inferior wall, and apex are akinetic. The mid and distal lateral wall, anterior septum, posterior wall, and apical anterior segment are hypokinetic. The anterior wall and antero-lateral wall are normal. Right Ventricle: The right ventricular size is moderately enlarged. Right  vetricular wall thickness was not well visualized. Right ventricular systolic function is severely reduced. There is mildly elevated pulmonary artery systolic pressure. The tricuspid regurgitant velocity is 2.61 m/s, and with an assumed right atrial pressure of 15 mmHg, the estimated right ventricular systolic pressure is 42.2 mmHg. Left Atrium: Left atrial size was moderately dilated. Right Atrium: Right atrial size was mild to moderately dilated. Pericardium: Trivial pericardial effusion is present. Mitral Valve: The mitral valve is normal in structure. Moderate mitral valve regurgitation. No evidence of mitral valve stenosis. Tricuspid Valve: The tricuspid valve is normal in structure. Tricuspid valve regurgitation is moderate to severe. No evidence of tricuspid stenosis. Aortic Valve: The aortic valve is tricuspid. There is mild calcification of the aortic valve. Aortic valve regurgitation is not visualized. Mild aortic valve sclerosis is present, with no evidence of aortic valve stenosis. Pulmonic Valve: The pulmonic valve was not well visualized. Pulmonic valve regurgitation is trivial. No evidence of pulmonic stenosis. Aorta: The aortic root, ascending aorta, aortic arch and descending aorta are all structurally normal, with no evidence of dilitation or obstruction. Venous: The inferior vena cava is dilated in size with less than 50% respiratory variability, suggesting right atrial pressure of 15 mmHg. IAS/Shunts: The atrial septum is grossly normal. Additional Comments: There is a small pleural effusion in both left and right lateral regions.  LEFT VENTRICLE PLAX 2D LVIDd:         4.70 cm  Diastology LVIDs:         3.70 cm  LV e' medial:    4.03 cm/s LV PW:         0.90 cm  LV E/e' medial:  27.5 LV IVS:        0.70 cm  LV e' lateral:   8.16 cm/s LVOT diam:     1.70 cm  LV E/e' lateral: 13.6 LV SV:         24 LV SV Index:   15 LVOT Area:     2.27 cm                          3D Volume EF:                          3D EF:        27 %                         LV EDV:       134 ml                         LV ESV:       98 ml                         LV SV:        36 ml RIGHT VENTRICLE RV S prime:     4.90 cm/s TAPSE (M-mode): 0.9 cm LEFT ATRIUM             Index       RIGHT ATRIUM           Index LA diam:        2.60 cm 1.62 cm/m  RA Area:     15.10 cm LA Vol (A2C):   51.7 ml 32.13 ml/m RA Volume:   39.00 ml  24.24 ml/m LA Vol (A4C):   40.5 ml 25.17 ml/m LA Biplane Vol: 47.4 ml 29.46 ml/m  AORTIC VALVE LVOT Vmax:   76.10 cm/s LVOT Vmean:  55.600 cm/s LVOT VTI:    0.107 m  AORTA Ao Root diam: 3.10 cm Ao Asc diam:  3.10 cm MITRAL VALVE                 TRICUSPID VALVE MV Area (PHT): 5.70 cm      TR Peak grad:   27.2 mmHg MV Decel Time: 133 msec      TR Vmax:        261.00 cm/s MR Peak grad:    68.2 mmHg MR Mean grad:    37.0 mmHg   SHUNTS MR Vmax:         413.00 cm/s Systemic VTI:  0.11 m MR Vmean:        281.0 cm/s  Systemic Diam: 1.70 cm MR PISA:         2.26 cm MR PISA Eff ROA: 17 mm MR PISA Radius:  0.60 cm MV E velocity: 111.00 cm/s MV A velocity: 71.50 cm/s MV E/A ratio:  1.55 Jodelle Red MD Electronically signed by Jodelle Red MD Signature Date/Time: 12/29/2020/9:05:18 AM    Final    ECHOCARDIOGRAM LIMITED  Result Date: 12/29/2020    ECHOCARDIOGRAM LIMITED REPORT   Patient Name:   TAFFANY HEISER Date of Exam: 12/29/2020 Medical Rec #:  [id number]    Height:       60.0 in Accession #:    [id number]   Weight:       141.0 lb Date of Birth:  November 26, 1945  BSA:          1.609 m Patient Age:    75 years     BP:           130/94 mmHg Patient Gender: F            HR:           82 bpm. Exam Location:  Inpatient Procedure: Limited Echo Indications:    Impella positioning  History:        Patient has prior history of Echocardiogram examinations, most                 recent 12/29/2020. Acute MI and CAD, Stroke;                 Signs/Symptoms:Altered Mental Status.  Sonographer:    Roosvelt Maser RDCS Referring  Phys: [phone] PETER M [place] IMPRESSIONS  1. Impella positioning with MD at bedside - 4.15 cm last measure. FINDINGS  Additional Comments: Impella positioning with MD at bedside - 4.15 cm last measure. Donato Schultz MD Electronically signed by Donato Schultz MD Signature Date/Time: 12/29/2020/5:56:13 PM    Final      Medications:     Scheduled Medications:  aspirin EC  81 mg Oral Daily   chlorhexidine  15 mL Mouth Rinse BID   Chlorhexidine Gluconate Cloth  6 each Topical Daily   Chlorhexidine Gluconate Cloth  6 each Topical Q0600   levothyroxine  100 mcg Oral Q0600   mouth rinse  15 mL Mouth Rinse q12n4p   sodium chloride flush  3 mL Intravenous Q12H   sodium chloride flush  3 mL Intravenous Q12H   thiamine  100 mg Oral Daily    Infusions:  sodium chloride 10 mL/hr at 12/30/20 0600   sodium chloride     cefTRIAXone (ROCEPHIN)  IV Stopped (12/30/20 0254)   heparin 50 units/mL (Impella PURGE) in dextrose 5 % 1000 mL bag     heparin 150 Units/hr (12/30/20 0600)   milrinone 0.375 mcg/kg/min (12/30/20 0600)    PRN Medications: sodium chloride, acetaminophen, ondansetron (ZOFRAN) IV, sodium chloride flush    Patient Profile   Ms Rolison is a 75 year old with history of htn, hyperlipidemia, CVA, hypothyroidism, and CKD Stage IIIa.   Admitted with chest pain--> NSTEMI -->cardiogenic shock    Assessment/Plan  NSTEMI--->CAD severe multivessel diseease-->Cardiogenic Shock -HS Trop 1607->3710. ECHO today EF 25-30% and severely reduced RV function.  - LHC with severe L main with multivessel  proximal LAD and mild RCA . ? Plavic -In the cath lab PA sat < 40%., CI 1.6.  Placed Impella 2.5 placed and started on milrinone  - Possible intervention later this week. Anticipate weaning impella after intervention with milrinone to bridge. - Impella P8 CVP 4. May need to decrease speed if suction events occur. Limited echo this morning.  - Blood CX no growth - Urine CX - pending.  -Hemodynamics  improving with mechanical support + milrinone 0.375 mcg  -Procalcitonin, LFTS and renal function improving.  -CVP 4. CO 3.8 CI 2.4.   - No diuretics for now.  - Add digoxin 0.125 mcg   - Add 12.5 mg losartan and 12.5 mg spironolactone.  - Supp K    2. Shock Liver  -On admit AST 223 ALT 1270  - LFTS improving.    3. Anemia  -Hgb drifting down. to 6.5 given 2UPRBCS with appropriate rise to 8.9  - Hematoma noted R groin and LUE.--> stable.  4. Hypothyroidism  -TSH 4.8  -Continue levothyroxine.    5. UTI  -Check UA/urine culture  - On rocephin    6. H/O CVA  7. AKI Creatinine 1.6--> 1.2 today.  Follow daily BMET   8. Hypokalemia  Supp K and add spiro.   Discussed with pharmacy and Dr Roby Lofts at the bedside. BMET this afternoon.  Check limited echo.   Length of Stay: 1  Amy Clegg, NP  12/30/2020, 7:22 AM  Advanced Heart Failure Team Pager [phone] (M-F; 7a - 5p)  Please contact [facility] for night-coverage after hours (5p -7a ) and weekends on [email or web address]  Agree with above.   Awake and alert. No CP. Hemodynamics much improved with milrinone and Impella. Lactate has cleared. Renal function normalized. K low. Hgb stable. Groin site without further bleeding.   General:  Lying flat in bed No resp difficulty HEENT: normal Neck: supple. RIJ swan Carotids 2+ bilat; no bruits. No lymphadenopathy or thryomegaly appreciated. Cor: PMI nondisplaced. Regular rate & rhythm. No rubs, gallops or murmurs. Lungs: clear Abdomen: soft, nontender, nondistended. No hepatosplenomegaly. No bruits or masses. Good bowel sounds. Extremities: no cyanosis, clubbing, rash, edema RFA Impella site ok. Resolving hematoma Neuro: alert & orientedx3, cranial nerves grossly intact. moves all 4 extremities w/o difficulty. Affect pleasant  Resolving shock with Impella CP (P-8 Flow 3.6. No suction alarms) and milrinone. Volume status ok. SVR still high but coming down.   Agree with adding GDMT +  digoxin and following hemodynamics. Continue abx for UTI.   D/w Dr. Lynnette Caffey Plan PCI LAD/RCA with Impella support tomorrow afternoon or Friday am. Supp K.   CRITICAL CARE Performed by: Arvilla Meres  Total critical care time: 40 minutes  Critical care time was exclusive of separately billable procedures and treating other patients.  Critical care was necessary to treat or prevent imminent or life-threatening deterioration.  Critical care was time spent personally by me (independent of midlevel providers or residents) on the following activities: development of treatment plan with patient and/or surrogate as well as nursing, discussions with consultants, evaluation of patient's response to treatment, examination of patient, obtaining history from patient or surrogate, ordering and performing treatments and interventions, ordering and review of laboratory studies, ordering and review of radiographic studies, pulse oximetry and re-evaluation of patient's condition.  Arvilla Meres, MD  8:50 AM

## 2020-12-31 ENCOUNTER — Encounter (HOSPITAL_COMMUNITY): Payer: Self-pay | Admitting: Internal Medicine

## 2020-12-31 ENCOUNTER — Inpatient Hospital Stay (HOSPITAL_COMMUNITY): Payer: Medicare HMO

## 2020-12-31 ENCOUNTER — Other Ambulatory Visit (HOSPITAL_COMMUNITY): Payer: Self-pay

## 2020-12-31 DIAGNOSIS — I214 Non-ST elevation (NSTEMI) myocardial infarction: Secondary | ICD-10-CM | POA: Diagnosis not present

## 2020-12-31 LAB — HEPATIC FUNCTION PANEL
ALT: 695 U/L — ABNORMAL HIGH (ref 0–44)
AST: 604 U/L — ABNORMAL HIGH (ref 15–41)
Albumin: 2.4 g/dL — ABNORMAL LOW (ref 3.5–5.0)
Alkaline Phosphatase: 154 U/L — ABNORMAL HIGH (ref 38–126)
Bilirubin, Direct: 0.5 mg/dL — ABNORMAL HIGH (ref 0.0–0.2)
Indirect Bilirubin: 0.6 mg/dL (ref 0.3–0.9)
Total Bilirubin: 1.1 mg/dL (ref 0.3–1.2)
Total Protein: 4.9 g/dL — ABNORMAL LOW (ref 6.5–8.1)

## 2020-12-31 LAB — COOXEMETRY PANEL
Carboxyhemoglobin: 1.9 % — ABNORMAL HIGH (ref 0.5–1.5)
Methemoglobin: 0.9 % (ref 0.0–1.5)
O2 Saturation: 63.3 %
Total hemoglobin: 7.6 g/dL — ABNORMAL LOW (ref 12.0–16.0)

## 2020-12-31 LAB — PREPARE RBC (CROSSMATCH)

## 2020-12-31 LAB — HEPARIN LEVEL (UNFRACTIONATED)
Heparin Unfractionated: 0.17 IU/mL — ABNORMAL LOW (ref 0.30–0.70)
Heparin Unfractionated: 0.13 IU/mL — ABNORMAL LOW (ref 0.30–0.70)

## 2020-12-31 LAB — BASIC METABOLIC PANEL
Anion gap: 4 — ABNORMAL LOW (ref 5–15)
BUN: 41 mg/dL — ABNORMAL HIGH (ref 8–23)
CO2: 25 mmol/L (ref 22–32)
Calcium: 8.1 mg/dL — ABNORMAL LOW (ref 8.9–10.3)
Chloride: 111 mmol/L (ref 98–111)
Creatinine, Ser: 0.95 mg/dL (ref 0.44–1.00)
GFR, Estimated: >60 mL/min (ref >60)
Glucose, Bld: 103 mg/dL — ABNORMAL HIGH (ref 70–99)
Potassium: 4.4 mmol/L (ref 3.5–5.1)
Sodium: 140 mmol/L (ref 135–145)

## 2020-12-31 LAB — CBC
HCT: 24 % — ABNORMAL LOW (ref 36.0–46.0)
Hemoglobin: 7.6 g/dL — ABNORMAL LOW (ref 12.0–15.0)
MCH: 30.8 pg (ref 26.0–34.0)
MCHC: 31.7 g/dL (ref 30.0–36.0)
MCV: 97.2 fL (ref 80.0–100.0)
Platelets: 101 10*3/uL — ABNORMAL LOW (ref 150–400)
RBC: 2.47 MIL/uL — ABNORMAL LOW (ref 3.87–5.11)
RDW: 16.9 % — ABNORMAL HIGH (ref 11.5–15.5)
WBC: 7.7 10*3/uL (ref 4.0–10.5)
nRBC: 11.3 % — ABNORMAL HIGH (ref 0.0–0.2)

## 2020-12-31 LAB — PROCALCITONIN: Procalcitonin: 2.51 ng/mL

## 2020-12-31 LAB — MAGNESIUM: Magnesium: 2.4 mg/dL (ref 1.7–2.4)

## 2020-12-31 LAB — LACTATE DEHYDROGENASE: LDH: 1119 U/L — ABNORMAL HIGH (ref 98–192)

## 2020-12-31 LAB — PATHOLOGIST SMEAR REVIEW: Path Review: INCREASED

## 2020-12-31 MED ORDER — SODIUM CHLORIDE 0.9 % IV SOLN: INTRAVENOUS | Status: DC

## 2020-12-31 MED ORDER — SODIUM CHLORIDE 0.9% FLUSH: 10.0000 mL | Freq: Two times a day (BID) | INTRAVENOUS | Status: DC

## 2020-12-31 MED ORDER — SALINE SPRAY 0.65 % NA SOLN
1.0000 | NASAL | Status: DC | PRN
  Administered 2020-12-31: 1 via NASAL
  Filled 2020-12-31: qty 44

## 2020-12-31 MED ORDER — SODIUM CHLORIDE 0.9% FLUSH
3.0000 mL | Freq: Two times a day (BID) | INTRAVENOUS | Status: DC
  Administered 2021-01-01 – 2021-01-07 (×6): 3 mL via INTRAVENOUS

## 2020-12-31 MED ORDER — ASPIRIN 81 MG PO CHEW: 81.0000 mg | CHEWABLE_TABLET | ORAL | Status: AC

## 2020-12-31 MED ORDER — EZETIMIBE 10 MG PO TABS
10.0000 mg | ORAL_TABLET | Freq: Every day | ORAL | Status: DC
  Administered 2020-12-31 – 2021-01-11 (×11): 10 mg via ORAL
  Filled 2020-12-31 (×11): qty 1

## 2020-12-31 MED ORDER — SODIUM CHLORIDE 0.9% FLUSH
10.0000 mL | Freq: Two times a day (BID) | INTRAVENOUS | Status: DC
  Administered 2020-12-31 – 2021-01-06 (×6): 10 mL
  Administered 2021-01-06: 20 mL
  Administered 2021-01-07: 10 mL

## 2020-12-31 MED ORDER — SODIUM CHLORIDE 0.9% FLUSH: 10.0000 mL | INTRAVENOUS | Status: DC | PRN

## 2020-12-31 MED ORDER — ATORVASTATIN CALCIUM 80 MG PO TABS
80.0000 mg | ORAL_TABLET | Freq: Every day | ORAL | Status: DC
  Administered 2020-12-31 – 2021-01-11 (×12): 80 mg via ORAL
  Filled 2020-12-31 (×11): qty 1

## 2020-12-31 MED ORDER — TICAGRELOR 90 MG PO TABS
180.0000 mg | ORAL_TABLET | Freq: Once | ORAL | Status: AC
  Administered 2020-12-31: 180 mg via ORAL
  Filled 2020-12-31: qty 2

## 2020-12-31 MED ORDER — TICAGRELOR 90 MG PO TABS
90.0000 mg | ORAL_TABLET | Freq: Two times a day (BID) | ORAL | Status: DC
  Administered 2020-12-31 – 2021-01-11 (×22): 90 mg via ORAL
  Filled 2020-12-31 (×22): qty 1

## 2020-12-31 MED ORDER — SODIUM CHLORIDE 0.9 % IV SOLN: 250.0000 mL | INTRAVENOUS | Status: DC | PRN

## 2020-12-31 MED ORDER — SODIUM CHLORIDE 0.9% FLUSH: 3.0000 mL | INTRAVENOUS | Status: DC | PRN

## 2020-12-31 MED ORDER — SODIUM CHLORIDE 0.9% IV SOLUTION: Freq: Once | INTRAVENOUS | Status: DC

## 2020-12-31 MED ORDER — ASPIRIN 81 MG PO CHEW: 81.0000 mg | CHEWABLE_TABLET | ORAL | Status: DC

## 2020-12-31 MED ORDER — SALINE SPRAY 0.65 % NA SOLN
1.0000 | NASAL | Status: DC | PRN
  Filled 2020-12-31: qty 44

## 2020-12-31 NOTE — Progress Notes (Signed)
  PCI cancelled today. Rescheduled for tomorrow.   Updated patient .   Give diet and make NPO after MN.   Abbygael Curtiss Np-C  3:22 PM

## 2020-12-31 NOTE — Progress Notes (Addendum)
Advanced Heart Failure Rounding Note  PCP-Cardiologist: Parke Poisson, MD   Subjective:   9/27 NSTEMI-->Cardiogenic shock. Impella CP placed and started on milrinone. Given 2 UPRBCs. Milrinone increased to 0.375 mcg.  9/28 Continued on milrinone 0.375 mcg + impella P8. Started on losartan and spiro.   Overnight developed nose bleed. Hgb 8.9> 7.6   Creatinine 1.2>1.1>0.9  Swan#s ---> Impella CP + milrinone 0.375 mcg  Impella @ P8  flow 3.5 CPO  0.6  CVP 3 PA 20/13 (16)  PWP 7 PVR 2.2  SVR 1576  CO 4.1 CI 2.5 CO-OX 63%   No chest pain. Denies SOB.    Objective:   Weight Range: 63.2 kg Body mass index is 27.21 kg/m.   Vital Signs:   Temp:  [98.4 F (36.9 C)-100 F (37.8 C)] 99.3 F (37.4 C) (09/29 0700) Pulse Rate:  [72-94] 81 (09/29 0700) Resp:  [15-28] 16 (09/29 0700) BP: (86-126)/(62-89) 103/73 (09/29 0700) SpO2:  [93 %-97 %] 97 % (09/29 0700) Weight:  [63.2 kg] 63.2 kg (09/29 0655) Last BM Date: 12/28/20  Weight change: Filed Weights   12/29/20 0200 12/30/20 0500 12/31/20 0655  Weight: 64 kg 57.6 kg 63.2 kg    Intake/Output:   Intake/Output Summary (Last 24 hours) at 12/31/2020 0720 Last data filed at 12/31/2020 0700 Gross per 24 hour  Intake 2744.37 ml  Output 1015 ml  Net 1729.37 ml      Physical Exam   CVP 1-2  General: No resp difficulty HEENT: normal Neck: supple. no JVD. Carotids 2+ bilat; no bruits. No lymphadenopathy or thryomegaly appreciated. RIJ swan  Cor: PMI nondisplaced. Regular rate & rhythm. No rubs, gallops or murmurs. Lungs: clear Abdomen: soft, nontender, nondistended. No hepatosplenomegaly. No bruits or masses. Good bowel sounds. Extremities: no cyanosis, clubbing, rash, edema. R groin Impella hematoma soft.  Neuro: alert & orientedx3, cranial nerves grossly intact. moves all 4 extremities w/o difficulty. Affect pleasant GU: Foley yellow urine.    Telemetry  SR 80s personally reviewed.   EKG    N/A   Labs     CBC Recent Labs    12/29/20 0714 12/29/20 1038 12/30/20 0423 12/31/20 0436  WBC 12.0*   < > 9.0 7.7  NEUTROABS 10.0*  --   --   --   HGB 7.4*   < > 8.9* 7.6*  HCT 23.8*   < > 28.1* 24.0*  MCV 102.1*   < > 95.9 97.2  PLT 270   < > 174 101*   < > = values in this interval not displayed.   Basic Metabolic Panel Recent Labs    27/06/23 2152 12/31/20 0436  NA 141 140  K 4.2 4.4  CL 110 111  CO2 27 25  GLUCOSE 120* 103*  BUN 49* 41*  CREATININE 1.07* 0.95  CALCIUM 8.2* 8.1*  MG 2.6* 2.4   Liver Function Tests Recent Labs    12/29/20 0014 12/30/20 0423  AST 2,238* 1,275*  ALT 1,270* 958*  ALKPHOS 279* 199*  BILITOT 1.9* 2.6*  PROT 6.7 5.3*  ALBUMIN 3.7 2.9*   No results for input(s): LIPASE, AMYLASE in the last 72 hours. Cardiac Enzymes No results for input(s): CKTOTAL, CKMB, CKMBINDEX, TROPONINI in the last 72 hours.  BNP: BNP (last 3 results) Recent Labs    12/29/20 0014  BNP >4,500.0*    ProBNP (last 3 results) No results for input(s): PROBNP in the last 8760 hours.   D-Dimer No results for input(s): DDIMER  in the last 72 hours. Hemoglobin A1C No results for input(s): HGBA1C in the last 72 hours. Fasting Lipid Panel No results for input(s): CHOL, HDL, LDLCALC, TRIG, CHOLHDL, LDLDIRECT in the last 72 hours. Thyroid Function Tests Recent Labs    12/29/20 0014  TSH 4.818*    Other results:   Imaging    No results found.   Medications:     Scheduled Medications:  aspirin EC  81 mg Oral Daily   chlorhexidine  15 mL Mouth Rinse BID   Chlorhexidine Gluconate Cloth  6 each Topical Daily   Chlorhexidine Gluconate Cloth  6 each Topical Q0600   digoxin  0.125 mg Oral Daily   feeding supplement  237 mL Oral BID BM   levothyroxine  100 mcg Oral Q0600   losartan  12.5 mg Oral BID   mouth rinse  15 mL Mouth Rinse q12n4p   sodium chloride flush  10-40 mL Intracatheter Q12H   sodium chloride flush  3 mL Intravenous Q12H   sodium chloride  flush  3 mL Intravenous Q12H   spironolactone  12.5 mg Oral Daily   thiamine  100 mg Oral Daily    Infusions:  sodium chloride 10 mL/hr at 12/31/20 0700   sodium chloride     cefTRIAXone (ROCEPHIN)  IV Stopped (12/31/20 0450)   heparin 50 units/mL (Impella PURGE) in dextrose 5 % 1000 mL bag     heparin 200 Units/hr (12/31/20 0700)   milrinone 0.375 mcg/kg/min (12/31/20 0700)    PRN Medications: sodium chloride, acetaminophen, ondansetron (ZOFRAN) IV, sodium chloride flush, sodium chloride flush    Patient Profile   Jane Navarro is a 75 year old with history of htn, hyperlipidemia, CVA, hypothyroidism, and CKD Stage IIIa.   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 ECHO today EF 25-30% and severely reduced RV function.  - LHC with severe L main with multivessel  proximal LAD and mild RCA . ? Plavic -In the cath lab PA sat < 40%., CI 1.6.  Placed Impella 2.5 placed and started on milrinone  - Possible intervention 24-48 hours  Anticipate weaning impella after intervention with milrinone to bridge. - Impella P8 CVP 2.  - Blood CX no growth - Urine CX - NGTD -Hemodynamics improving with mechanical support + milrinone 0.375 mcg  --CVP 2. CO 4.1 CI 2.5.  CO-OX 63%.  - Platelets trending down --? Impella..  - No diuretics for now.  - Continue digoxin 0.125 mcg   - Continue  12.5 mg losartan and 12.5 mg spironolactone.  - Renal function improving.  - Platelets trending down---> 243-->174>101  - Adding statin. On aspirin. Load Brillinta.   2. Shock Liver  -On admit AST 223 ALT 1270  - Check LFTs     3. Anemia  -Hgb drifting down. to 6.5 given 2UPRBCS with appropriate rise to 8.9  - Hematoma noted R groin and LUE.--> stable.  - Hgb drifting back down 7.6 . Had nose bleed this morning. Hematoma stable.  - Give 1UPRBCs    4. Hypothyroidism  -TSH 4.8   -Continue levothyroxine.    5. UTI  -Check UA/urine culture  - On rocephin    6. H/O CVA  7. AKI Creatinine 1.6--> 1.2 -> 0.9 Follow daily BMET   8. Hypokalemia  K 4.4    Give 1PURBCs now.   DR Lynnette Caffey, Dr Gala Romney, and Dr Laneta Simmers discussed with recommendations for  PCI/stents  today. Not a candidate for CABG.     Length of Stay: 2  Tonye Becket, NP  12/31/2020, 7:20 AM  Advanced Heart Failure Team Pager [phone] (M-F; 7a - 5p)  Please contact [facility] for night-coverage after hours (5p -7a ) and weekends on [email or web address]  Agree with above.   Remains on Impella. Milrinone 0.375. Hemodynamics much improved. SVR still a bit elevated. LDH trending down. PLT also trending down.   Impella on P-8. Flow 3.5. Goof waveforms  General:  Lying in bed No resp difficulty HEENT: normal Neck: supple. no JVD. Carotids 2+ bilat; no bruits. No lymphadenopathy or thryomegaly appreciated. Cor: PMI nondisplaced. Regular rate & rhythm. No rubs, gallops or murmurs. Lungs: clear Abdomen: soft, nontender, nondistended. No hepatosplenomegaly. No bruits or masses. Good bowel sounds. Extremities: no cyanosis, clubbing, rash, edema Impella RFA. Site looks good  Neuro: alert & orientedx3, cranial nerves grossly intact. moves all 4 extremities w/o difficulty. Affect pleasant  Hemodynamics much improved with Impella support and milrinone. PLTs drifting down. Impella slightly deep on echo interrogation at 4.7cm. Pulled back personally to just under 4cm. Can turn down to P-7 as needed  I discussed case with Drs. Bartle (TCTS) and Thukkani at length yesterday in Heart Team approach to care. CABG is prohibitive risk with age, decondition and other comorbidities. Will plan supported PCI of LAD and RCA today. Begin Impella wean post procedure. Agree with transfusing 1u RBC today.   CRITICAL CARE Performed by: Arvilla Meres  Total critical care time: 45 minutes  Critical care time was exclusive of  separately billable procedures and treating other patients.  Critical care was necessary to treat or prevent imminent or life-threatening deterioration.  Critical care was time spent personally by me (independent of midlevel providers or residents) on the following activities: development of treatment plan with patient and/or surrogate as well as nursing, discussions with consultants, evaluation of patient's response to treatment, examination of patient, obtaining history from patient or surrogate, ordering and performing treatments and interventions, ordering and review of laboratory studies, ordering and review of radiographic studies, pulse oximetry and re-evaluation of patient's condition.  Arvilla Meres, MD  9:04 AM

## 2020-12-31 NOTE — Progress Notes (Signed)
Interventional Cardiology Note:  Patient remains critically ill Impella p8/milrinone with improved CO/CI.  CVP 3, PAD 12 CO 4.1 MAP 70s, PA sat 63, CPO ~0.6.  No acute events overnight.  Has nuisance nose bleed.  Hgb slightly lower, plt down to 100 (due to Impella).  Multidisciplinary review with Drs. Bensimhon and Bartle conducted yesterday.  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  For this reason, will proceed to Impella assisted PCI.  I discussed the risks and benefits of this strategy with the patient and her daughter [name] [phone]) and answered all of their questions.  They agree with this strategy.  Will load with Ticagrelor 180 now with PCI later today.

## 2020-12-31 NOTE — Progress Notes (Addendum)
ANTICOAGULATION CONSULT NOTE  Pharmacy Consult for heparin Indication:  Impella  Allergies  Allergen Reactions   Ciprofloxacin    Erythromycin    Penicillins    Ampicillin Rash    Mostly topical per daughter    Patient Measurements: Height: 5' (152.4 cm) Weight: 63.2 kg (139 lb 5.3 oz) IBW/kg (Calculated) : 45.5 Heparin Dosing Weight: 58 kg  Vital Signs: Temp: 99.1 F (37.3 C) (09/29 1300) Temp Source: Core (09/29 1200) BP: 106/85 (09/29 1300) Pulse Rate: 83 (09/29 1300)  Labs: Recent Labs    12/29/20 0014 12/29/20 0143 12/29/20 0714 12/29/20 1520 12/29/20 2116 12/30/20 0423 12/30/20 1255 12/30/20 1354 12/30/20 2152 12/31/20 0436 12/31/20 1104  HGB  --   --    < > 6.5*  --  8.9*  --   --   --  7.6*  --   HCT  --   --    < > 20.8*  --  28.1*  --   --   --  24.0*  --   PLT  --   --    < > 243  --  174  --   --   --  101*  --   HEPARINUNFRC  --   --   --   --    < > 0.14*   < >  --  0.20* 0.17* 0.13*  CREATININE  --   --    < >  --   --  1.20*  --  1.13* 1.07* 0.95  --   TROPONINIHS 7,477* 7,263*  --   --   --   --   --   --   --   --   --    < > = values in this interval not displayed.     Estimated Creatinine Clearance: 43.1 mL/min (by C-G formula based on SCr of 0.95 mg/dL).   ADDENDUM Plan: PCI rescheduled for 9/30. Will increase systemic heparin to 300 units/hour to provide a total of 1190 units/hour. Recheck heparin level in 6 hours to ensure it is not elevated. Plan for no further titration tonight, will reassess during morning rounds.     Assessment: 73 yoF admitted w/ CP s/p LHC with Impella CP placement. Pharmacy consulted for systemic heparin management.  Pt with some oozing post-cath, now resolved.   Heparin level came back subtherapeutic at 0.13 this AM. Current rate of heparin in the purge flow is 17.8 mL/hour (890 units/hr). Concentration of the purge solution is 50 units/mL. Systemic heparin is running at 200 units/hr. Total heparin rate is  currently 1090 units/hour. CBC trending down, patient received 1 unit of blood. Patient experienced nose bleeds this morning. Patient is undergoing PCI in 1 hour, so will not make adjustments at this time. Plan to follow up plans for anticoagulation post-cath.  Goal of Therapy:  Heparin level 0.2-0.5 units/ml Monitor platelets by anticoagulation protocol: Yes   Plan:  Heparinized purge currently 17.8 mL/hour (890 units/hr) Systemic heparin to 200 units/hr Reevaluate heparin after cath lab  Thank you for allowing pharmacy to participate in this patient's care.  Enos Fling, PharmD PGY1 Pharmacy Resident 12/31/2020 2:05 PM Check [email or web address] for unit specific pharmacy number

## 2020-12-31 NOTE — Plan of Care (Deleted)
  Problem: Clinical Measurements: Goal: Respiratory complications will improve Outcome: Progressing   Problem: Coping: Goal: Level of anxiety will decrease Outcome: Progressing   

## 2020-12-31 NOTE — Plan of Care (Signed)
  Problem: Clinical Measurements: Goal: Respiratory complications will improve 12/31/2020 1657 by Waverly Ferrari D, RN Outcome: Progressing 12/31/2020 1631 by Waverly Ferrari D, RN Outcome: Progressing   Problem: Coping: Goal: Level of anxiety will decrease 12/31/2020 1657 by Waverly Ferrari D, RN Outcome: Progressing 12/31/2020 1631 by Gilberto Better, RN Outcome: Progressing

## 2021-01-01 ENCOUNTER — Inpatient Hospital Stay (HOSPITAL_COMMUNITY)
Admission: EM | Disposition: A | Discharge status: Rehabilitation Facility | Payer: Self-pay | Source: Ambulatory Visit | Attending: Internal Medicine

## 2021-01-01 ENCOUNTER — Inpatient Hospital Stay (HOSPITAL_COMMUNITY): Payer: Medicare HMO

## 2021-01-01 DIAGNOSIS — I214 Non-ST elevation (NSTEMI) myocardial infarction: Secondary | ICD-10-CM | POA: Diagnosis not present

## 2021-01-01 DIAGNOSIS — Z95811 Presence of heart assist device: Secondary | ICD-10-CM

## 2021-01-01 DIAGNOSIS — R570 Cardiogenic shock: Secondary | ICD-10-CM | POA: Diagnosis not present

## 2021-01-01 DIAGNOSIS — R579 Shock, unspecified: Secondary | ICD-10-CM | POA: Diagnosis not present

## 2021-01-01 HISTORY — PX: CORONARY STENT INTERVENTION: CATH118234

## 2021-01-01 HISTORY — PX: INTRAVASCULAR ULTRASOUND/IVUS: CATH118244

## 2021-01-01 LAB — ECHOCARDIOGRAM LIMITED
Height: 60 in
Height: 60 in
Weight: 2261.04 oz
Weight: 2261.04 oz

## 2021-01-01 LAB — CBC
HCT: 28.2 % — ABNORMAL LOW (ref 36.0–46.0)
HCT: 28.2 % — ABNORMAL LOW (ref 36.0–46.0)
HCT: 34 % — ABNORMAL LOW (ref 36.0–46.0)
Hemoglobin: 9.1 g/dL — ABNORMAL LOW (ref 12.0–15.0)
Hemoglobin: 8.8 g/dL — ABNORMAL LOW (ref 12.0–15.0)
Hemoglobin: 11.4 g/dL — ABNORMAL LOW (ref 12.0–15.0)
MCH: 30.3 pg (ref 26.0–34.0)
MCH: 30.4 pg (ref 26.0–34.0)
MCH: 30.2 pg (ref 26.0–34.0)
MCHC: 32.3 g/dL (ref 30.0–36.0)
MCHC: 31.2 g/dL (ref 30.0–36.0)
MCHC: 33.5 g/dL (ref 30.0–36.0)
MCV: 94 fL (ref 80.0–100.0)
MCV: 97.6 fL (ref 80.0–100.0)
MCV: 89.9 fL (ref 80.0–100.0)
Platelets: 74 10*3/uL — ABNORMAL LOW (ref 150–400)
Platelets: 64 10*3/uL — ABNORMAL LOW (ref 150–400)
Platelets: 54 10*3/uL — ABNORMAL LOW (ref 150–400)
RBC: 3 MIL/uL — ABNORMAL LOW (ref 3.87–5.11)
RBC: 2.89 MIL/uL — ABNORMAL LOW (ref 3.87–5.11)
RBC: 3.78 MIL/uL — ABNORMAL LOW (ref 3.87–5.11)
RDW: 16.4 % — ABNORMAL HIGH (ref 11.5–15.5)
RDW: 17 % — ABNORMAL HIGH (ref 11.5–15.5)
RDW: 15.4 % (ref 11.5–15.5)
WBC: 6.9 10*3/uL (ref 4.0–10.5)
WBC: 6.8 10*3/uL (ref 4.0–10.5)
WBC: 7.7 10*3/uL (ref 4.0–10.5)
nRBC: 4.2 % — ABNORMAL HIGH (ref 0.0–0.2)
nRBC: 2.7 % — ABNORMAL HIGH (ref 0.0–0.2)
nRBC: 1.8 % — ABNORMAL HIGH (ref 0.0–0.2)

## 2021-01-01 LAB — COMPREHENSIVE METABOLIC PANEL
ALT: 725 U/L — ABNORMAL HIGH (ref 0–44)
AST: 668 U/L — ABNORMAL HIGH (ref 15–41)
Albumin: 2.5 g/dL — ABNORMAL LOW (ref 3.5–5.0)
Alkaline Phosphatase: 143 U/L — ABNORMAL HIGH (ref 38–126)
Anion gap: 5 (ref 5–15)
BUN: 30 mg/dL — ABNORMAL HIGH (ref 8–23)
CO2: 24 mmol/L (ref 22–32)
Calcium: 8.1 mg/dL — ABNORMAL LOW (ref 8.9–10.3)
Chloride: 109 mmol/L (ref 98–111)
Creatinine, Ser: 0.81 mg/dL (ref 0.44–1.00)
GFR, Estimated: >60 mL/min (ref >60)
Glucose, Bld: 114 mg/dL — ABNORMAL HIGH (ref 70–99)
Potassium: 3.9 mmol/L (ref 3.5–5.1)
Sodium: 138 mmol/L (ref 135–145)
Total Bilirubin: 1.8 mg/dL — ABNORMAL HIGH (ref 0.3–1.2)
Total Protein: 4.7 g/dL — ABNORMAL LOW (ref 6.5–8.1)

## 2021-01-01 LAB — HEPARIN LEVEL (UNFRACTIONATED)
Heparin Unfractionated: 0.17 IU/mL — ABNORMAL LOW (ref 0.30–0.70)
Heparin Unfractionated: 0.24 IU/mL — ABNORMAL LOW (ref 0.30–0.70)

## 2021-01-01 LAB — COOXEMETRY PANEL
Carboxyhemoglobin: 2.5 % — ABNORMAL HIGH (ref 0.5–1.5)
Methemoglobin: 1 % (ref 0.0–1.5)
O2 Saturation: 62.9 %
Total hemoglobin: 9.5 g/dL — ABNORMAL LOW (ref 12.0–16.0)

## 2021-01-01 LAB — TYPE AND SCREEN
ABO/RH(D): O POS
Antibody Screen: NEGATIVE

## 2021-01-01 LAB — POCT ACTIVATED CLOTTING TIME
Activated Clotting Time: 173 seconds
Activated Clotting Time: 260 seconds
Activated Clotting Time: 561 seconds
Activated Clotting Time: 254 seconds
Activated Clotting Time: >1000 seconds
Activated Clotting Time: 300 seconds

## 2021-01-01 LAB — FIBRINOGEN: Fibrinogen: 466 mg/dL (ref 210–475)

## 2021-01-01 LAB — PREPARE RBC (CROSSMATCH)

## 2021-01-01 LAB — PROTIME-INR
INR: 2.3 — ABNORMAL HIGH (ref 0.8–1.2)
Prothrombin Time: 25.4 seconds — ABNORMAL HIGH (ref 11.4–15.2)

## 2021-01-01 LAB — APTT: aPTT: >200 seconds (ref 24–36)

## 2021-01-01 LAB — MAGNESIUM: Magnesium: 2.2 mg/dL (ref 1.7–2.4)

## 2021-01-01 LAB — LACTATE DEHYDROGENASE: LDH: 1435 U/L — ABNORMAL HIGH (ref 98–192)

## 2021-01-01 SURGERY — CORONARY STENT INTERVENTION
Anesthesia: LOCAL

## 2021-01-01 MED ORDER — BIVALIRUDIN BOLUS VIA INFUSION - CUPID
INTRAVENOUS | Status: DC | PRN
  Administered 2021-01-01: 48.075 mg via INTRAVENOUS

## 2021-01-01 MED ORDER — SODIUM CHLORIDE 0.9% FLUSH: 3.0000 mL | INTRAVENOUS | Status: DC | PRN

## 2021-01-01 MED ORDER — MIDAZOLAM HCL 2 MG/2ML IJ SOLN
INTRAMUSCULAR | Status: DC | PRN
  Administered 2021-01-01 (×2): 1 mg via INTRAVENOUS

## 2021-01-01 MED ORDER — HEPARIN (PORCINE) IN NACL 2000-0.9 UNIT/L-% IV SOLN
INTRAVENOUS | Status: DC | PRN
  Administered 2021-01-01 (×2): 1000 mL

## 2021-01-01 MED ORDER — FUROSEMIDE 10 MG/ML IJ SOLN
INTRAMUSCULAR | Status: AC
  Filled 2021-01-01: qty 4

## 2021-01-01 MED ORDER — FUROSEMIDE 10 MG/ML IJ SOLN
INTRAMUSCULAR | Status: DC | PRN
  Administered 2021-01-01: 10 mg via INTRAVENOUS

## 2021-01-01 MED ORDER — SODIUM CHLORIDE 0.9% IV SOLUTION: Freq: Once | INTRAVENOUS | Status: AC

## 2021-01-01 MED ORDER — HEPARIN (PORCINE) IN NACL 1000-0.9 UT/500ML-% IV SOLN
INTRAVENOUS | Status: AC
  Filled 2021-01-01: qty 1000

## 2021-01-01 MED ORDER — SODIUM CHLORIDE 0.9 % IV SOLN: INTRAVENOUS | Status: DC | PRN

## 2021-01-01 MED ORDER — IOHEXOL 350 MG/ML SOLN
INTRAVENOUS | Status: DC | PRN
  Administered 2021-01-01: 220 mL via INTRA_ARTERIAL

## 2021-01-01 MED ORDER — LABETALOL HCL 5 MG/ML IV SOLN: 10.0000 mg | INTRAVENOUS | Status: AC | PRN

## 2021-01-01 MED ORDER — NITROGLYCERIN 1 MG/10 ML FOR IR/CATH LAB
INTRA_ARTERIAL | Status: AC
  Filled 2021-01-01: qty 10

## 2021-01-01 MED ORDER — SODIUM CHLORIDE 0.9 % IV SOLN
INTRAVENOUS | Status: DC
Start: 2021-01-01 — End: 2021-01-07
  Administered 2021-01-01: 250 mL via INTRAVENOUS

## 2021-01-01 MED ORDER — "THROMBI-PAD 3""X3"" EX PADS"
1.0000 | MEDICATED_PAD | Freq: Once | CUTANEOUS | Status: AC
  Administered 2021-01-01: 1 via TOPICAL

## 2021-01-01 MED ORDER — HEPARIN (PORCINE) IN NACL 2000-0.9 UNIT/L-% IV SOLN
INTRAVENOUS | Status: AC
  Filled 2021-01-01: qty 1000

## 2021-01-01 MED ORDER — HEPARIN SODIUM (PORCINE) 1000 UNIT/ML IJ SOLN
INTRAMUSCULAR | Status: AC
  Filled 2021-01-01: qty 1

## 2021-01-01 MED ORDER — FENTANYL CITRATE (PF) 100 MCG/2ML IJ SOLN
INTRAMUSCULAR | Status: AC
  Filled 2021-01-01: qty 2

## 2021-01-01 MED ORDER — DEXTROSE 5 % SOLN FOR IMPELLA PURGE CATHETER
INTRAVENOUS | Status: DC
  Administered 2021-01-01: 1000 mL
  Filled 2021-01-01 (×2): qty 250

## 2021-01-01 MED ORDER — BIVALIRUDIN TRIFLUOROACETATE 250 MG IV SOLR
INTRAVENOUS | Status: AC
  Filled 2021-01-01: qty 250

## 2021-01-01 MED ORDER — MIDAZOLAM HCL 2 MG/2ML IJ SOLN
INTRAMUSCULAR | Status: AC
  Filled 2021-01-01: qty 2

## 2021-01-01 MED ORDER — ONDANSETRON HCL 4 MG/2ML IJ SOLN
4.0000 mg | Freq: Four times a day (QID) | INTRAMUSCULAR | Status: DC | PRN
Start: 2021-01-01 — End: 2021-01-01

## 2021-01-01 MED ORDER — VERAPAMIL HCL 2.5 MG/ML IV SOLN
INTRAVENOUS | Status: AC
  Filled 2021-01-01: qty 2

## 2021-01-01 MED ORDER — LIDOCAINE HCL (PF) 1 % IJ SOLN
INTRAMUSCULAR | Status: AC
  Filled 2021-01-01: qty 30

## 2021-01-01 MED ORDER — NOREPINEPHRINE 4 MG/250ML-% IV SOLN: 0.0000 ug/min | INTRAVENOUS | Status: DC

## 2021-01-01 MED ORDER — POTASSIUM CHLORIDE CRYS ER 20 MEQ PO TBCR
20.0000 meq | EXTENDED_RELEASE_TABLET | Freq: Once | ORAL | Status: AC
  Administered 2021-01-01: 20 meq via ORAL
  Filled 2021-01-01: qty 1

## 2021-01-01 MED ORDER — OXYCODONE-ACETAMINOPHEN 5-325 MG PO TABS
1.0000 | ORAL_TABLET | Freq: Four times a day (QID) | ORAL | Status: DC | PRN
Start: 2021-01-01 — End: 2021-01-11
  Administered 2021-01-01 – 2021-01-07 (×4): 1 via ORAL
  Filled 2021-01-01 (×4): qty 1

## 2021-01-01 MED ORDER — SODIUM CHLORIDE 0.9 % IV SOLN
0.0250 mg/kg/h | INTRAVENOUS | Status: DC
  Administered 2021-01-02: 0.025 mg/kg/h via INTRAVENOUS
  Filled 2021-01-01: qty 250

## 2021-01-01 MED ORDER — FENTANYL CITRATE PF 50 MCG/ML IJ SOSY
25.0000 ug | PREFILLED_SYRINGE | INTRAMUSCULAR | Status: DC | PRN
  Administered 2021-01-01: 25 ug via INTRAVENOUS
  Filled 2021-01-01 (×2): qty 1

## 2021-01-01 MED ORDER — HEPARIN SODIUM (PORCINE) 1000 UNIT/ML IJ SOLN
INTRAMUSCULAR | Status: DC | PRN
  Administered 2021-01-01 (×2): 5000 [IU] via INTRAVENOUS

## 2021-01-01 MED ORDER — SODIUM CHLORIDE 0.9 % IV SOLN
INTRAVENOUS | Status: DC | PRN
  Administered 2021-01-01: 1.75 mg/kg/h via INTRAVENOUS

## 2021-01-01 MED ORDER — LIDOCAINE-EPINEPHRINE 1 %-1:100000 IJ SOLN
INTRAMUSCULAR | Status: DC | PRN
  Administered 2021-01-01: 10 mL

## 2021-01-01 MED ORDER — LIDOCAINE-EPINEPHRINE 1 %-1:100000 IJ SOLN
INTRAMUSCULAR | Status: AC
  Filled 2021-01-01: qty 1

## 2021-01-01 MED ORDER — ACETAMINOPHEN 325 MG PO TABS: 650.0000 mg | ORAL_TABLET | ORAL | Status: DC | PRN

## 2021-01-01 MED ORDER — LIDOCAINE-EPINEPHRINE 1 %-1:100000 IJ SOLN
20.0000 mL | Freq: Once | INTRAMUSCULAR | Status: AC
  Administered 2021-01-01: 20 mL via INTRADERMAL

## 2021-01-01 MED ORDER — SODIUM CHLORIDE 0.9% FLUSH: 3.0000 mL | Freq: Two times a day (BID) | INTRAVENOUS | Status: DC

## 2021-01-01 MED ORDER — SODIUM CHLORIDE 0.9 % IV SOLN
250.0000 mL | INTRAVENOUS | Status: DC | PRN
Start: 2021-01-01 — End: 2021-01-04

## 2021-01-01 MED ORDER — LIDOCAINE HCL (PF) 1 % IJ SOLN
INTRAMUSCULAR | Status: DC | PRN
  Administered 2021-01-01: 12 mL

## 2021-01-01 MED ORDER — FENTANYL CITRATE (PF) 100 MCG/2ML IJ SOLN
INTRAMUSCULAR | Status: DC | PRN
  Administered 2021-01-01 (×2): 25 ug via INTRAVENOUS

## 2021-01-01 MED ORDER — HYDRALAZINE HCL 20 MG/ML IJ SOLN: 10.0000 mg | INTRAMUSCULAR | Status: AC | PRN

## 2021-01-01 SURGICAL SUPPLY — 41 items
BALLN MINITREK OTW 2.0X12 (BALLOONS) ×2
BALLN SAPPHIRE 2.5X12 (BALLOONS) ×2
BALLN SAPPHIRE ~~LOC~~ 2.0X12 (BALLOONS) ×2 IMPLANT
BALLN SAPPHIRE ~~LOC~~ 4.0X15 (BALLOONS) ×2 IMPLANT
BALLN SAPPHIRE ~~LOC~~ 4.0X18 (BALLOONS) ×2 IMPLANT
BALLOON MINITREK OTW 2.0X12 (BALLOONS) ×1 IMPLANT
BALLOON SAPPHIRE 2.5X12 (BALLOONS) ×1 IMPLANT
CATH LAUNCHER 6FR EBU 3 (CATHETERS) ×2 IMPLANT
CATH LAUNCHER 6FR EBU3.5 (CATHETERS) ×2 IMPLANT
CATH LAUNCHER 6FR JL4 (CATHETERS) ×2 IMPLANT
CATH LAUNCHER 6FR JR4 (CATHETERS) ×2 IMPLANT
CATH OPTICROSS HD (CATHETERS) ×2 IMPLANT
CATH SHOCKWAVE 2.5X12 (CATHETERS) ×1 IMPLANT
CATH SUPERCROSS ANGLED 90 DEG (MICROCATHETER) ×2 IMPLANT
CATH VISTA GUIDE 6FR JR4 (CATHETERS) ×2 IMPLANT
CATHETER SHOCKWAVE 2.5X12 (CATHETERS) ×2
CLOSURE PERCLOSE PROSTYLE (VASCULAR PRODUCTS) ×2 IMPLANT
COVER SWIFTLINK CONNECTOR (BAG) ×2 IMPLANT
FEM STOP ARCH (HEMOSTASIS) ×1
GUIDELINER 6F (CATHETERS) ×2 IMPLANT
KIT ENCORE 26 ADVANTAGE (KITS) ×4 IMPLANT
KIT HEART LEFT (KITS) ×2 IMPLANT
KIT HEMO VALVE WATCHDOG (MISCELLANEOUS) ×2 IMPLANT
PACK CARDIAC CATHETERIZATION (CUSTOM PROCEDURE TRAY) ×2 IMPLANT
SHEATH PINNACLE 6F 10CM (SHEATH) ×2 IMPLANT
SHEATH PROBE COVER 6X72 (BAG) ×2 IMPLANT
SLED PULL BACK IVUS (MISCELLANEOUS) ×2 IMPLANT
STENT ONYX FRONTIER 3.0X34 (Permanent Stent) ×2 IMPLANT
STENT ONYX FRONTIER 4.0X15 (Permanent Stent) ×2 IMPLANT
STENT ONYX FRONTIER 4.0X18 (Permanent Stent) ×2 IMPLANT
SYSTEM COMPRESSION FEMOSTOP (HEMOSTASIS) ×1 IMPLANT
TRANSDUCER W/STOPCOCK (MISCELLANEOUS) ×2 IMPLANT
TUBING CIL FLEX 10 FLL-RA (TUBING) ×2 IMPLANT
WIRE ASAHI FIELDER XT 300CM (WIRE) ×4 IMPLANT
WIRE ASAHI PROWATER 180CM (WIRE) ×6 IMPLANT
WIRE ASAHI PROWATER 300CM (WIRE) ×4 IMPLANT
WIRE ASAHI SION 190X3X12 .014 (WIRE) ×4 IMPLANT
WIRE ASAHI SION 300X3X12 .014 (WIRE) ×2 IMPLANT
WIRE EMERALD 3MM-J .035X150CM (WIRE) ×2 IMPLANT
WIRE GUIDE ASAHI EXTENSION 165 (WIRE) ×2 IMPLANT
WIRE HI TORQ BMW 190CM (WIRE) ×4 IMPLANT

## 2021-01-01 NOTE — Progress Notes (Signed)
ANTICOAGULATION CONSULT NOTE  Pharmacy Consult for heparin> Bivalirudin Indication:  Impella  CP  Allergies  Allergen Reactions   Ciprofloxacin    Erythromycin    Penicillins    Ampicillin Rash    Mostly topical per daughter    Patient Measurements: Height: 5' (152.4 cm) Weight: 64.1 kg (141 lb 5 oz) IBW/kg (Calculated) : 45.5 Heparin Dosing Weight: 58 kg  Vital Signs: Temp: 99 F (37.2 C) (09/30 0900) Temp Source: Core (09/30 0800) BP: 98/74 (09/30 1536) Pulse Rate: 86 (09/30 1536)  Labs: Recent Labs    12/30/20 0423 12/30/20 1255 12/30/20 2152 12/31/20 0436 12/31/20 1104 12/31/20 2355 01/01/21 0459  HGB 8.9*  --   --  7.6*  --   --  9.1*  HCT 28.1*  --   --  24.0*  --   --  28.2*  PLT 174  --   --  101*  --   --  74*  HEPARINUNFRC 0.14*   < > 0.20* 0.17* 0.13* 0.17* 0.24*  CREATININE 1.20*   < > 1.07* 0.95  --   --  0.81   < > = values in this interval not displayed.     Estimated Creatinine Clearance: 50.9 mL/min (by C-G formula based on SCr of 0.81 mg/dL).   Assessment: 22 yoF admitted w/ CP s/p LHC with Impella CP placement. Pharmacy consulted for systemic anticoagulation management.  Pt with some oozing post initial cath, now resolved.   Heparin initially in impella purge solution and low dose systemic heparin.  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 CPP, BCPS Clinical Pharmacist [phone] 01/01/2021 4:18 PM   Check [email or web address] for unit specific pharmacy number

## 2021-01-01 NOTE — Progress Notes (Signed)
Patient with ongoing bleeding left groin after PCI today. Seen by critical care today partly for coagulopathy this morning, recs to transfuse Hgb and may require platelet transfusion.Thrombotyopenia thought to be due to hemolysis from impella. Defer manaement of coagulatiopathy to critical care, would consider platelet transfusion. Keep fem stop and manual pressure.   Dina Rich MD

## 2021-01-01 NOTE — Progress Notes (Signed)
  Echocardiogram 2D Echocardiogram has been performed.  Delcie Roch 01/01/2021, 8:21 AM

## 2021-01-01 NOTE — Procedures (Signed)
Arterial Catheter Insertion Procedure Note  Jane Navarro  [id number]  11/22/1945  Date:01/01/21  Time:4:52 PM    Provider Performing: Merlene Laughter    Procedure: Insertion of Arterial Line ([id number]) without US guidance  Indication(s) Blood pressure monitoring and/or need for frequent ABGs  Consent Unable to obtain consent due to emergent nature of procedure.  Anesthesia None   Time Out Verified patient identification, verified procedure, site/side was marked, verified correct patient position, special equipment/implants available, medications/allergies/relevant history reviewed, required imaging and test results available.   Sterile Technique Maximal sterile technique was not able to be achieved due to emergent nature of procedure.   Procedure Description Area of catheter insertion was cleaned with chlorhexidine and draped in sterile fashion. Without real-time ultrasound guidance an arterial catheter was placed into the right radial artery.  Appropriate arterial tracings confirmed on monitor.     Complications/Tolerance None; patient tolerated the procedure well.   EBL Minimal   Specimen(s) None  Aline inserted by RT into right radial without complications. No consent obtained due to emergent nature of procedure. Aline was able to pull back and flush without complication. Rt will monitor.

## 2021-01-01 NOTE — H&P (View-Only) (Signed)
  Advanced Heart Failure Rounding Note  PCP-Cardiologist: Gayatri A Acharya, MD   Subjective:   9/27 NSTEMI-->Cardiogenic shock. Impella CP placed and started on milrinone. Given 2 UPRBCs. Milrinone increased to 0.375 mcg.  9/28 Continued on milrinone 0.375 mcg + impella P8. Started on losartan and spiro.   Remains on Impella CP P-8. Flow 3.5. Good waveforms. No alarms. On milrinone 0.375 mcg   Swan# s CVP 5  PA 21/13 (17)  PVR 138 PWP 10 SVR 1658 CO 4.63 CI 2.87 Co-ox 63%   LDH up again, 1,119>>1,435   LFTs trending back up AST 604>>668 ALT 695>>725   Scr stable 0.81  Pts continue to trend down, 101>>74 Hgb up post transfusion 9.1   Feels ok this morning. Denies CP. No dyspnea.    Objective:   Weight Range: 64.1 kg Body mass index is 27.6 kg/m.   Vital Signs:   Temp:  [98.8 F (37.1 C)-99.7 F (37.6 C)] 99.3 F (37.4 C) (09/30 0600) Pulse Rate:  [70-87] 73 (09/30 0600) Resp:  [12-27] 24 (09/30 0600) BP: (83-116)/(67-92) 116/87 (09/30 0600) SpO2:  [92 %-98 %] 96 % (09/30 0600) Weight:  [64.1 kg] 64.1 kg (09/30 0420) Last BM Date: 12/28/20  Weight change: Filed Weights   12/30/20 0500 12/31/20 0655 01/01/21 0420  Weight: 57.6 kg 63.2 kg 64.1 kg    Intake/Output:   Intake/Output Summary (Last 24 hours) at 01/01/2021 0704 Last data filed at 01/01/2021 0630 Gross per 24 hour  Intake 1480.8 ml  Output 1230 ml  Net 250.8 ml      Physical Exam  CVP 5  General:  Well appearing. No respiratory difficulty HEENT: dentition in poor repair, otherwise normal Neck: supple. no JVD. + rt IJ Swan. Carotids 2+ bilat; no bruits. No lymphadenopathy or thyromegaly appreciated. Cor: PMI nondisplaced. Regular rate & rhythm. No rubs, gallops or murmurs. Lungs: clear Abdomen: soft, nontender, nondistended. No hepatosplenomegaly. No bruits or masses. Good bowel sounds. Extremities: no cyanosis, clubbing, rash, edema + Rt femoral Impella site w/ mild oozing.   Neuro: alert & oriented x 3, cranial nerves grossly intact. moves all 4 extremities w/o difficulty. Affect pleasant.   Telemetry   NSR 80s personally reviewed.   EKG    N/A   Labs    CBC Recent Labs    12/29/20 0714 12/29/20 1038 12/31/20 0436 01/01/21 0459  WBC 12.0*   < > 7.7 6.9  NEUTROABS 10.0*  --   --   --   HGB 7.4*   < > 7.6* 9.1*  HCT 23.8*   < > 24.0* 28.2*  MCV 102.1*   < > 97.2 94.0  PLT 270   < > 101* 74*   < > = values in this interval not displayed.   Basic Metabolic Panel Recent Labs    12/31/20 0436 01/01/21 0459  NA 140 138  K 4.4 3.9  CL 111 109  CO2 25 24  GLUCOSE 103* 114*  BUN 41* 30*  CREATININE 0.95 0.81  CALCIUM 8.1* 8.1*  MG 2.4 2.2   Liver Function Tests Recent Labs    12/31/20 0631 01/01/21 0459  AST 604* 668*  ALT 695* 725*  ALKPHOS 154* 143*  BILITOT 1.1 1.8*  PROT 4.9* 4.7*  ALBUMIN 2.4* 2.5*   No results for input(s): LIPASE, AMYLASE in the last 72 hours. Cardiac Enzymes No results for input(s): CKTOTAL, CKMB, CKMBINDEX, TROPONINI in the last 72 hours.  BNP: BNP (last 3 results) Recent Labs      12/29/20 0014  BNP >4,500.0*    ProBNP (last 3 results) No results for input(s): PROBNP in the last 8760 hours.   D-Dimer No results for input(s): DDIMER in the last 72 hours. Hemoglobin A1C No results for input(s): HGBA1C in the last 72 hours. Fasting Lipid Panel No results for input(s): CHOL, HDL, LDLCALC, TRIG, CHOLHDL, LDLDIRECT in the last 72 hours. Thyroid Function Tests No results for input(s): TSH, T4TOTAL, T3FREE, THYROIDAB in the last 72 hours.  Invalid input(s): FREET3   Other results:   Imaging    No results found.   Medications:     Scheduled Medications:  aspirin EC  81 mg Oral Daily   atorvastatin  80 mg Oral Daily   chlorhexidine  15 mL Mouth Rinse BID   Chlorhexidine Gluconate Cloth  6 each Topical Daily   digoxin  0.125 mg Oral Daily   ezetimibe  10 mg Oral Daily   feeding  supplement  237 mL Oral BID BM   levothyroxine  100 mcg Oral Q0600   losartan  12.5 mg Oral BID   mouth rinse  15 mL Mouth Rinse q12n4p   sodium chloride flush  10-40 mL Intracatheter Q12H   sodium chloride flush  3 mL Intravenous Q12H   spironolactone  12.5 mg Oral Daily   thiamine  100 mg Oral Daily   ticagrelor  90 mg Oral BID    Infusions:  sodium chloride     sodium chloride 10 mL/hr at 01/01/21 0639   heparin 50 units/mL (Impella PURGE) in dextrose 5 % 1000 mL bag     heparin 300 Units/hr (01/01/21 0600)   milrinone 0.375 mcg/kg/min (01/01/21 0600)    PRN Medications: sodium chloride, acetaminophen, ondansetron (ZOFRAN) IV, sodium chloride, sodium chloride flush, sodium chloride flush    Patient Profile   Jane Navarro is a 75 year old with history of htn, hyperlipidemia, CVA, hypothyroidism, and CKD Stage IIIa.   Admitted with chest pain--> NSTEMI -->cardiogenic shock    Assessment/Plan  NSTEMI--->CAD severe multivessel diseease-->Cardiogenic Shock -HS Trop 7477->7263. ECHO today EF 25-30% and severely reduced RV function.  - LHC with severe L main with multivessel  proximal LAD and mild RCA .  - In the cath lab PA sat < 40%., CI 1.6.  Placed Impella 2.5 placed and started on milrinone  -Hemodynamics improving with mechanical support + milrinone 0.375 mcg  --CVP 5. CO 4.63 CI 2.88  CO-OX 63%. - felt too high risk for CABG. Planning PCI of LAD and RCA today  - Continue digoxin 0.125 mcg   -Continue  12.5 mg losartan bid and 12.5 mg spironolactone.  - Renal function improving.  - Platelets trending down---> 243-->174>101>71  - Continue Atorvastatin 80  - ASA 81, Brilinta 90 mg bid    2. Shock Liver  - On admit AST 223 ALT 1270  - Improved, but bump past 24 hr - AST 604>>668 - ALT 695>>725 - F/u CMP in am    3. Anemia  - Hgb dropped to 6.5>> given 2UPRBCS with appropriate rise to 8.9.  - 1U RBCs given yesterday, Hgb 7.6>>9.1 - Hematoma noted R groin and  LUE.--> stable.    4. Hypothyroidism  -TSH 4.8  -Continue levothyroxine.    5. UTI  - Urine culture NGTD - Completed course of rocephin    6. H/O CVA  7. AKI Creatinine 1.6--> 1.2 -> 0.9->0.81  Follow daily BMET   8. Hypokalemia  - K 3.9   Length of Stay:   3  Jane Lis, PA-C  01/01/2021, 7:04 AM  Advanced Heart Failure Team Pager [phone] (M-F; 7a - 5p)  Please contact [facility] for night-coverage after hours (5p -7a ) and weekends on [email or web address]  Patient seen with PA, agree with the above note.   Co-ox 63%, CVP 4-5 with PCWP 10 and CI 2.89.  Jane Navarro remains on Impella CP P8 and milrinone 0.375.  Heparin running for Impella.   Platelets falling, 74K today.  LDH rising and LFTs rising.  Creatinine stable at 0.81.    No complaints this morning, no chest pain.   General: NAD Neck: No JVD, no thyromegaly or thyroid nodule.  Lungs: Clear to auscultation bilaterally with normal respiratory effort. CV: Nondisplaced PMI.  Heart regular S1/S2, no S3/S4, no murmur.  No peripheral edema.   Abdomen: Soft, nontender, no hepatosplenomegaly, no distention.  Skin: Intact without lesions or rashes.  Neurologic: Alert and oriented x 3.  Psych: Normal affect. Extremities: No clubbing or cyanosis. Impella CP right groin.  HEENT: Normal.   Concerning for some hemolysis with rising LDH and falling platelets (not markedly low yet at 74K).  I assessed position of Impella under echo, it is at about 3.6 cm and looks to be in good position, I did not change.  I suspect Jane Navarro is a bit volume down for the Impella with CVP 4-5.  With good co-ox and thermo CO, I will decrease Impella to P6 for now and give her some gentle pre-PCI IV fluid.   Continue milrinone 0.25 for now, suspect we can wean off Impella fairly soon after PCI as long as no complications.  No diuretic today.  Ok to to continue low dose losartan, spironolactone, and digoxin.   For PCI today with Dr. Lynnette Caffey with Impella  protection.   CRITICAL CARE Performed by: Marca Ancona  Total critical care time: 40 minutes  Critical care time was exclusive of separately billable procedures and treating other patients.  Critical care was necessary to treat or prevent imminent or life-threatening deterioration.  Critical care was time spent personally by me on the following activities: development of treatment plan with patient and/or surrogate as well as nursing, discussions with consultants, evaluation of patient's response to treatment, examination of patient, obtaining history from patient or surrogate, ordering and performing treatments and interventions, ordering and review of laboratory studies, ordering and review of radiographic studies, pulse oximetry and re-evaluation of patient's condition.  Marca Ancona 01/01/2021 8:17 AM

## 2021-01-01 NOTE — Progress Notes (Signed)
On my arrival, patient had a large amount of bright red blood covering her L groin site where her fem stop is located. I immediately cleansed the site to assess where the bleeding was coming from but I was unable to determine a location. I applied manual pressure below the site of the femstop and notified CCM and Cardiology of my assessment findings. I increased the pressure on the femstop to whilst also holding manual pressure on the site. Patient still has a dopplerable pulse in her foot though her foot is cool to touch.   CCM and Cardiology came to bedside to assess. I sent off repeat labs and on will notify the disciplines as necessary pending their results.   At the time of writing, the site appears to have slowed in bleeding. There is a palpable hematoma medial to the femstop in the pelvic region but it does not appear to be growing in size. I will continue to monitor the patient and groin site closely.

## 2021-01-01 NOTE — Consult Note (Addendum)
NAME:  Jane Navarro, MRN:  [id number], DOB:  1945/10/05, LOS: 3 ADMISSION DATE:  12/28/2020, CONSULTATION DATE:  01/01/2021 REFERRING MD:  Shirlee Latch, CHIEF COMPLAINT:  hemorrhagic shock   History of Present Illness:  75 year old woman who developed hypotension felt to be due to bleeding at PCI groin access site.   She originally presented on 9/03 with AMS due to severe hypothyroidism.  She presented again on 9/26 with complaints of retrosternal chest pain and she ruled in for a non-STEMI.  Found to have an ischemic cardiomyopathy with an EF of 25-30% and grade 2 diastolic dysfunction.  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  A CP Impella was placed and she was started on milrinone with improvement in signs of congestion.  Deemed to be poor candidate for surgery, so underwent staged multivessel PCI.  Pertinent  Medical History   Past Medical History:  Diagnosis Date   Anemia    CVA (cerebral vascular accident) (HCC)    HLD (hyperlipidemia)    Hypothyroidism    Tobacco abuse, in remission      Significant Hospital Events: Including procedures, antibiotic start and stop dates in addition to other pertinent events   9/30 -successful deployment of overlapping RCA stents.  Received intraprocedural bivalirudin. 9/30 subsequent hypotension in ICU with evidence of bleeding at the site  Interim History / Subjective:  Patient is awake and complains of feeling generally lightheaded with discomfort at the PCI site.  No shortness of breath no chest pain.  Objective   Blood pressure 98/74, pulse 86, temperature 99 F (37.2 C), resp. rate (!) 24, height 5' (1.524 m), weight 64.1 kg, SpO2 97 %. PAP: (15-25)/(9-16) 25/16 CVP:  [1 mmHg-5 mmHg] 3 mmHg PCWP:  [6 mmHg-10 mmHg] 10 mmHg CO:  [3.7 L/min-4.6 L/min] 4.6 L/min CI:  [2.3 L/min/m2-2.9 L/min/m2] 2.9 L/min/m2      Intake/Output Summary (Last 24 hours) at 01/01/2021 1651 Last data filed at 01/01/2021  1000 Gross per 24 hour  Intake 538.87 ml  Output 835 ml  Net -296.13 ml   Filed Weights   12/30/20 0500 12/31/20 0655 01/01/21 0420  Weight: 57.6 kg 63.2 kg 64.1 kg    Examination: General: Elderly frail appearing woman HENT: Dry mucous membranes Lungs: Chest clear Cardiovascular: Heart sounds distant but normal.  Extremities warm capillary refill 3 to 6 seconds Abdomen: Abdomen is soft Extremities: Right groin introducer with Swan.  Left groin bleeding from percutaneous site with swelling and ecchymoses of labia consistent with bleeding.  Distal pulses dopplerable. Neuro: Sensorium intact.  No focal neurological deficits. GU: Clear urine.  Resolved Hospital Problem list   None.  Assessment & Plan:  Critically ill due to hemorrhagic shock requiring titration of vasopressors and administration of blood products. Critically ill due to decompensated diastolic heart failure and cardiogenic shock requiring titration of milrinone and Impella support due to ischemic cardiomyopathy Coronary artery disease status post PCI RCA. Thrombocytopenia likely related to hemolysis from CP Impella. Severe hypothyroidism-now treated AKI Resolving shock liver. Acute on chronic anemia normocytic normochromic anemia  Presence of CP Impella with hemolysis and platelet consumption, likely also to have acquired von Willebrand by same mechanism. Also received Ticagrelor and bivalirudin.  Plan: -Hold local pressure to achieve hemostasis -Transfuse PRBCs.  Repeat hemoglobin.  May require platelet transfusion but need to balance this with risk of thrombosis of fresh sent.  -Titrate blood products and norepinephrine to keep systolic blood pressure 90-110 -Continue milrinone and  CP Impella at current settings maintain normal SCV O2 -Continue to follow creatinine following contrast load   Best Practice (right click and "Reselect all SmartList Selections" daily)   Diet/type: NPO DVT prophylaxis: not  indicated GI prophylaxis: N/A Lines: Central line Foley:  Yes, and it is still needed Code Status:  full code Last date of multidisciplinary goals of care discussion [updated daughter at the bedside]  Labs   CBC: Recent Labs  Lab 12/29/20 0714 12/29/20 1038 12/29/20 1227 12/29/20 1520 12/30/20 0423 12/31/20 0436 01/01/21 0459  WBC 12.0*  --   --  10.4 9.0 7.7 6.9  NEUTROABS 10.0*  --   --   --   --   --   --   HGB 7.4*   < > 5.8* 6.5* 8.9* 7.6* 9.1*  HCT 23.8*   < > 17.0* 20.8* 28.1* 24.0* 28.2*  MCV 102.1*  --   --  101.5* 95.9 97.2 94.0  PLT 270  --   --  243 174 101* 74*   < > = values in this interval not displayed.    Basic Metabolic Panel: Recent Labs  Lab 12/29/20 0714 12/29/20 1038 12/30/20 0423 12/30/20 1354 12/30/20 2152 12/31/20 0436 01/01/21 0459  NA 139   < > 143 142 141 140 138  K 3.8   < > 2.8* 3.4* 4.2 4.4 3.9  CL 105  --  108 110 110 111 109  CO2 19*  --  28 29 27 25 24   GLUCOSE 109*  --  116* 148* 120* 103* 114*  BUN 72*  --  60* 52* 49* 41* 30*  CREATININE 1.66*  --  1.20* 1.13* 1.07* 0.95 0.81  CALCIUM 9.1  --  8.2* 8.3* 8.2* 8.1* 8.1*  MG 2.7*  --  2.6*  --  2.6* 2.4 2.2   < > = values in this interval not displayed.   GFR: Estimated Creatinine Clearance: 50.9 mL/min (by C-G formula based on SCr of 0.81 mg/dL). Recent Labs  Lab 12/29/20 0714 12/29/20 1510 12/29/20 1520 12/29/20 2116 12/30/20 0423 12/31/20 0436 01/01/21 0459  PROCALCITON 4.12  --   --   --  3.88 2.51  --   WBC 12.0*  --  10.4  --  9.0 7.7 6.9  LATICACIDVEN  --  2.5*  --  1.8  --   --   --     Liver Function Tests: Recent Labs  Lab 12/29/20 0014 12/30/20 0423 12/31/20 0631 01/01/21 0459  AST 2,238* 1,275* 604* 668*  ALT 1,270* 958* 695* 725*  ALKPHOS 279* 199* 154* 143*  BILITOT 1.9* 2.6* 1.1 1.8*  PROT 6.7 5.3* 4.9* 4.7*  ALBUMIN 3.7 2.9* 2.4* 2.5*   No results for input(s): LIPASE, AMYLASE in the last 168 hours. Recent Labs  Lab 12/29/20 0014   AMMONIA 12    ABG    Component Value Date/Time   PHART 7.409 12/29/2020 1227   PCO2ART 29.4 (L) 12/29/2020 1227   PO2ART 76 (L) 12/29/2020 1227   HCO3 18.6 (L) 12/29/2020 1227   TCO2 19 (L) 12/29/2020 1227   ACIDBASEDEF 6.0 (H) 12/29/2020 1227   O2SAT 62.9 01/01/2021 0459     Coagulation Profile: No results for input(s): INR, PROTIME in the last 168 hours.  Cardiac Enzymes: No results for input(s): CKTOTAL, CKMB, CKMBINDEX, TROPONINI in the last 168 hours.  HbA1C: Hgb A1c MFr Bld  Date/Time Value Ref Range Status  12/05/2020 07:56 PM 5.5 4.8 - 5.6 % Final  Comment:    (NOTE)         Prediabetes: 5.7 - 6.4         Diabetes: >6.4         Glycemic control for adults with diabetes: <7.0     CBG: No results for input(s): GLUCAP in the last 168 hours.  CRITICAL CARE Performed by: Lynnell Catalan   Total critical care time: 35 minutes  Critical care time was exclusive of separately billable procedures and treating other patients.  Critical care was necessary to treat or prevent imminent or life-threatening deterioration.  Critical care was time spent personally by me on the following activities: development of treatment plan with patient and/or surrogate as well as nursing, discussions with consultants, evaluation of patient's response to treatment, examination of patient, obtaining history from patient or surrogate, ordering and performing treatments and interventions, ordering and review of laboratory studies, ordering and review of radiographic studies, pulse oximetry, re-evaluation of patient's condition and participation in multidisciplinary rounds.  Lynnell Catalan, MD [facility] ICU Physician [facility] [place] [facility]  Pager: [phone] Mobile: [phone] After hours: [phone].

## 2021-01-01 NOTE — Progress Notes (Addendum)
Advanced Heart Failure Rounding Note  PCP-Cardiologist: Parke Poisson, MD   Subjective:   9/27 NSTEMI-->Cardiogenic shock. Impella CP placed and started on milrinone. Given 2 UPRBCs. Milrinone increased to 0.375 mcg.  9/28 Continued on milrinone 0.375 mcg + impella P8. Started on losartan and spiro.   Remains on Impella CP P-8. Flow 3.5. Good waveforms. No alarms. On milrinone 0.375 mcg   Swan# s CVP 5  PA 21/13 (17)  PVR 138 PWP 10 SVR 1658 CO 4.63 CI 2.87 Co-ox 63%   LDH up again, 1,119>>1,435   LFTs trending back up AST 604>>668 ALT 695>>725   Scr stable 0.81  Pts continue to trend down, 101>>74 Hgb up post transfusion 9.1   Feels ok this morning. Denies CP. No dyspnea.    Objective:   Weight Range: 64.1 kg Body mass index is 27.6 kg/m.   Vital Signs:   Temp:  [98.8 F (37.1 C)-99.7 F (37.6 C)] 99.3 F (37.4 C) (09/30 0600) Pulse Rate:  [70-87] 73 (09/30 0600) Resp:  [12-27] 24 (09/30 0600) BP: (83-116)/(67-92) 116/87 (09/30 0600) SpO2:  [92 %-98 %] 96 % (09/30 0600) Weight:  [64.1 kg] 64.1 kg (09/30 0420) Last BM Date: 12/28/20  Weight change: Filed Weights   12/30/20 0500 12/31/20 0655 01/01/21 0420  Weight: 57.6 kg 63.2 kg 64.1 kg    Intake/Output:   Intake/Output Summary (Last 24 hours) at 01/01/2021 0704 Last data filed at 01/01/2021 0630 Gross per 24 hour  Intake 1480.8 ml  Output 1230 ml  Net 250.8 ml      Physical Exam  CVP 5  General:  Well appearing. No respiratory difficulty HEENT: dentition in poor repair, otherwise normal Neck: supple. no JVD. + rt IJ Swan. Carotids 2+ bilat; no bruits. No lymphadenopathy or thyromegaly appreciated. Cor: PMI nondisplaced. Regular rate & rhythm. No rubs, gallops or murmurs. Lungs: clear Abdomen: soft, nontender, nondistended. No hepatosplenomegaly. No bruits or masses. Good bowel sounds. Extremities: no cyanosis, clubbing, rash, edema + Rt femoral Impella site w/ mild oozing.   Neuro: alert & oriented x 3, cranial nerves grossly intact. moves all 4 extremities w/o difficulty. Affect pleasant.   Telemetry   NSR 80s personally reviewed.   EKG    N/A   Labs    CBC Recent Labs    12/29/20 0714 12/29/20 1038 12/31/20 0436 01/01/21 0459  WBC 12.0*   < > 7.7 6.9  NEUTROABS 10.0*  --   --   --   HGB 7.4*   < > 7.6* 9.1*  HCT 23.8*   < > 24.0* 28.2*  MCV 102.1*   < > 97.2 94.0  PLT 270   < > 101* 74*   < > = values in this interval not displayed.   Basic Metabolic Panel Recent Labs    35/00/93 0436 01/01/21 0459  NA 140 138  K 4.4 3.9  CL 111 109  CO2 25 24  GLUCOSE 103* 114*  BUN 41* 30*  CREATININE 0.95 0.81  CALCIUM 8.1* 8.1*  MG 2.4 2.2   Liver Function Tests Recent Labs    12/31/20 0631 01/01/21 0459  AST 604* 668*  ALT 695* 725*  ALKPHOS 154* 143*  BILITOT 1.1 1.8*  PROT 4.9* 4.7*  ALBUMIN 2.4* 2.5*   No results for input(s): LIPASE, AMYLASE in the last 72 hours. Cardiac Enzymes No results for input(s): CKTOTAL, CKMB, CKMBINDEX, TROPONINI in the last 72 hours.  BNP: BNP (last 3 results) Recent Labs  12/29/20 0014  BNP >4,500.0*    ProBNP (last 3 results) No results for input(s): PROBNP in the last 8760 hours.   D-Dimer No results for input(s): DDIMER in the last 72 hours. Hemoglobin A1C No results for input(s): HGBA1C in the last 72 hours. Fasting Lipid Panel No results for input(s): CHOL, HDL, LDLCALC, TRIG, CHOLHDL, LDLDIRECT in the last 72 hours. Thyroid Function Tests No results for input(s): TSH, T4TOTAL, T3FREE, THYROIDAB in the last 72 hours.  Invalid input(s): FREET3   Other results:   Imaging    No results found.   Medications:     Scheduled Medications:  aspirin EC  81 mg Oral Daily   atorvastatin  80 mg Oral Daily   chlorhexidine  15 mL Mouth Rinse BID   Chlorhexidine Gluconate Cloth  6 each Topical Daily   digoxin  0.125 mg Oral Daily   ezetimibe  10 mg Oral Daily   feeding  supplement  237 mL Oral BID BM   levothyroxine  100 mcg Oral Q0600   losartan  12.5 mg Oral BID   mouth rinse  15 mL Mouth Rinse q12n4p   sodium chloride flush  10-40 mL Intracatheter Q12H   sodium chloride flush  3 mL Intravenous Q12H   spironolactone  12.5 mg Oral Daily   thiamine  100 mg Oral Daily   ticagrelor  90 mg Oral BID    Infusions:  sodium chloride     sodium chloride 10 mL/hr at 01/01/21 7867   heparin 50 units/mL (Impella PURGE) in dextrose 5 % 1000 mL bag     heparin 300 Units/hr (01/01/21 0600)   milrinone 0.375 mcg/kg/min (01/01/21 0600)    PRN Medications: sodium chloride, acetaminophen, ondansetron (ZOFRAN) IV, sodium chloride, sodium chloride flush, sodium chloride flush    Patient Profile   Ms Grilliot is a 75 year old with history of htn, hyperlipidemia, CVA, hypothyroidism, and CKD Stage IIIa.   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 ECHO today EF 25-30% and severely reduced RV function.  - LHC with severe L main with multivessel  proximal LAD and mild RCA .  - In the cath lab PA sat < 40%., CI 1.6.  Placed Impella 2.5 placed and started on milrinone  -Hemodynamics improving with mechanical support + milrinone 0.375 mcg  --CVP 5. CO 4.63 CI 2.88  CO-OX 63%. - felt too high risk for CABG. Planning PCI of LAD and RCA today  - Continue digoxin 0.125 mcg   -Continue  12.5 mg losartan bid and 12.5 mg spironolactone.  - Renal function improving.  - Platelets trending down---> 243-->174>101>71  - Continue Atorvastatin 80  - ASA 81, Brilinta 90 mg bid    2. Shock Liver  - On admit AST 223 ALT 1270  - Improved, but bump past 24 hr - AST 604>>668 - ALT 695>>725 - F/u CMP in am    3. Anemia  - Hgb dropped to 6.5>> given 2UPRBCS with appropriate rise to 8.9.  - 1U RBCs given yesterday, Hgb 7.6>>9.1 - Hematoma noted R groin and  LUE.--> stable.    4. Hypothyroidism  -TSH 4.8  -Continue levothyroxine.    5. UTI  - Urine culture NGTD - Completed course of rocephin    6. H/O CVA  7. AKI Creatinine 1.6--> 1.2 -> 0.9->0.81  Follow daily BMET   8. Hypokalemia  - K 3.9   Length of Stay:  3  Robbie Lis, PA-C  01/01/2021, 7:04 AM  Advanced Heart Failure Team Pager [phone] (M-F; 7a - 5p)  Please contact [facility] for night-coverage after hours (5p -7a ) and weekends on [email or web address]  Patient seen with PA, agree with the above note.   Co-ox 63%, CVP 4-5 with PCWP 10 and CI 2.89.  She remains on Impella CP P8 and milrinone 0.375.  Heparin running for Impella.   Platelets falling, 74K today.  LDH rising and LFTs rising.  Creatinine stable at 0.81.    No complaints this morning, no chest pain.   General: NAD Neck: No JVD, no thyromegaly or thyroid nodule.  Lungs: Clear to auscultation bilaterally with normal respiratory effort. CV: Nondisplaced PMI.  Heart regular S1/S2, no S3/S4, no murmur.  No peripheral edema.   Abdomen: Soft, nontender, no hepatosplenomegaly, no distention.  Skin: Intact without lesions or rashes.  Neurologic: Alert and oriented x 3.  Psych: Normal affect. Extremities: No clubbing or cyanosis. Impella CP right groin.  HEENT: Normal.   Concerning for some hemolysis with rising LDH and falling platelets (not markedly low yet at 74K).  I assessed position of Impella under echo, it is at about 3.6 cm and looks to be in good position, I did not change.  I suspect she is a bit volume down for the Impella with CVP 4-5.  With good co-ox and thermo CO, I will decrease Impella to P6 for now and give her some gentle pre-PCI IV fluid.   Continue milrinone 0.25 for now, suspect we can wean off Impella fairly soon after PCI as long as no complications.  No diuretic today.  Ok to to continue low dose losartan, spironolactone, and digoxin.   For PCI today with Dr. Lynnette Caffey with Impella  protection.   CRITICAL CARE Performed by: Marca Ancona  Total critical care time: 40 minutes  Critical care time was exclusive of separately billable procedures and treating other patients.  Critical care was necessary to treat or prevent imminent or life-threatening deterioration.  Critical care was time spent personally by me on the following activities: development of treatment plan with patient and/or surrogate as well as nursing, discussions with consultants, evaluation of patient's response to treatment, examination of patient, obtaining history from patient or surrogate, ordering and performing treatments and interventions, ordering and review of laboratory studies, ordering and review of radiographic studies, pulse oximetry and re-evaluation of patient's condition.  Marca Ancona 01/01/2021 8:17 AM

## 2021-01-01 NOTE — Interval H&P Note (Signed)
History and Physical Interval Note:  01/01/2021 10:30 AM  Jane Navarro  has presented today for surgery, with the diagnosis of coronary artery disease.  The various methods of treatment have been discussed with the patient and family. After consideration of risks, benefits and other options for treatment, the patient has consented to  Procedure(s): CORONARY STENT INTERVENTION (N/A) as a surgical intervention.  The patient's history has been reviewed, patient examined, no change in status, stable for surgery.  I have reviewed the patient's chart and labs.  Questions were answered to the patient's satisfaction.    Cath Lab Visit (complete for each Cath Lab visit)  Clinical Evaluation Leading to the Procedure:   ACS: Yes.    Non-ACS:    Anginal Classification: CCS IV  Anti-ischemic medical therapy: No Therapy  Non-Invasive Test Results: No non-invasive testing performed  Prior CABG: No previous CABG        [name]

## 2021-01-01 NOTE — Progress Notes (Signed)
  Echocardiogram 2D Echocardiogram has been performed.  Delcie Roch 01/01/2021, 5:25 PM

## 2021-01-01 NOTE — Progress Notes (Signed)
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  PTT > 200. In addition, patient had bleeding from L groin site this evening and is still oozing. Discussed angiomax management with pharmacy. Recommendations are below.  eICU Interventions  Plan: Hold angiomax for now. Repeat aPTT at 12 midnight.  Decide on angiomax initiation based on aPTT result and status of L groin bleeding.      Intervention Category Major Interventions: Other:  Lenell Antu 01/01/2021, 10:44 PM

## 2021-01-02 DIAGNOSIS — R570 Cardiogenic shock: Secondary | ICD-10-CM | POA: Diagnosis not present

## 2021-01-02 DIAGNOSIS — I214 Non-ST elevation (NSTEMI) myocardial infarction: Secondary | ICD-10-CM | POA: Diagnosis not present

## 2021-01-02 DIAGNOSIS — R579 Shock, unspecified: Secondary | ICD-10-CM | POA: Diagnosis not present

## 2021-01-02 LAB — COMPREHENSIVE METABOLIC PANEL
ALT: 579 U/L — ABNORMAL HIGH (ref 0–44)
AST: 394 U/L — ABNORMAL HIGH (ref 15–41)
Albumin: 2.4 g/dL — ABNORMAL LOW (ref 3.5–5.0)
Alkaline Phosphatase: 113 U/L (ref 38–126)
Anion gap: 6 (ref 5–15)
BUN: 22 mg/dL (ref 8–23)
CO2: 20 mmol/L — ABNORMAL LOW (ref 22–32)
Calcium: 7.7 mg/dL — ABNORMAL LOW (ref 8.9–10.3)
Chloride: 110 mmol/L (ref 98–111)
Creatinine, Ser: 0.68 mg/dL (ref 0.44–1.00)
GFR, Estimated: >60 mL/min (ref >60)
Glucose, Bld: 125 mg/dL — ABNORMAL HIGH (ref 70–99)
Potassium: 3.8 mmol/L (ref 3.5–5.1)
Sodium: 136 mmol/L (ref 135–145)
Total Bilirubin: 1.3 mg/dL — ABNORMAL HIGH (ref 0.3–1.2)
Total Protein: 4.8 g/dL — ABNORMAL LOW (ref 6.5–8.1)

## 2021-01-02 LAB — APTT
aPTT: 51 seconds — ABNORMAL HIGH (ref 24–36)
aPTT: 51 seconds — ABNORMAL HIGH (ref 24–36)
aPTT: 51 seconds — ABNORMAL HIGH (ref 24–36)
aPTT: 60 seconds — ABNORMAL HIGH (ref 24–36)

## 2021-01-02 LAB — TYPE AND SCREEN
ABO/RH(D): O POS
Antibody Screen: NEGATIVE
Unit division: 0
Unit division: 0
Unit division: 0
Unit division: 0
Unit division: 0
Unit division: 0
Unit division: 0

## 2021-01-02 LAB — CBC
HCT: 32.8 % — ABNORMAL LOW (ref 36.0–46.0)
HCT: 30.1 % — ABNORMAL LOW (ref 36.0–46.0)
Hemoglobin: 11 g/dL — ABNORMAL LOW (ref 12.0–15.0)
Hemoglobin: 9.8 g/dL — ABNORMAL LOW (ref 12.0–15.0)
MCH: 30.8 pg (ref 26.0–34.0)
MCH: 30.4 pg (ref 26.0–34.0)
MCHC: 33.5 g/dL (ref 30.0–36.0)
MCHC: 32.6 g/dL (ref 30.0–36.0)
MCV: 91.9 fL (ref 80.0–100.0)
MCV: 93.5 fL (ref 80.0–100.0)
Platelets: 49 10*3/uL — ABNORMAL LOW (ref 150–400)
Platelets: 38 10*3/uL — ABNORMAL LOW (ref 150–400)
RBC: 3.57 MIL/uL — ABNORMAL LOW (ref 3.87–5.11)
RBC: 3.22 MIL/uL — ABNORMAL LOW (ref 3.87–5.11)
RDW: 15.8 % — ABNORMAL HIGH (ref 11.5–15.5)
RDW: 16.5 % — ABNORMAL HIGH (ref 11.5–15.5)
WBC: 7.8 10*3/uL (ref 4.0–10.5)
WBC: 8.2 10*3/uL (ref 4.0–10.5)
nRBC: 0.9 % — ABNORMAL HIGH (ref 0.0–0.2)
nRBC: 0.5 % — ABNORMAL HIGH (ref 0.0–0.2)

## 2021-01-02 LAB — BPAM RBC
Blood Product Expiration Date: 202210282359
Blood Product Expiration Date: 202210282359
Blood Product Expiration Date: 202210312359
Blood Product Expiration Date: 202210312359
Blood Product Expiration Date: 202210312359
Blood Product Expiration Date: 202210312359
Blood Product Expiration Date: 202211022359
ISSUE DATE / TIME: 202209271612
ISSUE DATE / TIME: 202209271612
ISSUE DATE / TIME: 202209290819
ISSUE DATE / TIME: 202209301639
ISSUE DATE / TIME: 202209301639
ISSUE DATE / TIME: 202210010322
ISSUE DATE / TIME: 202210010609
Unit Type and Rh: 5100
Unit Type and Rh: 5100
Unit Type and Rh: 5100
Unit Type and Rh: 5100
Unit Type and Rh: 5100
Unit Type and Rh: 5100
Unit Type and Rh: 5100

## 2021-01-02 LAB — BASIC METABOLIC PANEL
Anion gap: 3 — ABNORMAL LOW (ref 5–15)
BUN: 19 mg/dL (ref 8–23)
CO2: 21 mmol/L — ABNORMAL LOW (ref 22–32)
Calcium: 7.6 mg/dL — ABNORMAL LOW (ref 8.9–10.3)
Chloride: 112 mmol/L — ABNORMAL HIGH (ref 98–111)
Creatinine, Ser: 0.62 mg/dL (ref 0.44–1.00)
GFR, Estimated: >60 mL/min (ref >60)
Glucose, Bld: 89 mg/dL (ref 70–99)
Potassium: 3.6 mmol/L (ref 3.5–5.1)
Sodium: 136 mmol/L (ref 135–145)

## 2021-01-02 LAB — GLUCOSE, CAPILLARY
Glucose-Capillary: 115 mg/dL — ABNORMAL HIGH (ref 70–99)
Glucose-Capillary: 82 mg/dL (ref 70–99)

## 2021-01-02 LAB — COOXEMETRY PANEL
Carboxyhemoglobin: 1.8 % — ABNORMAL HIGH (ref 0.5–1.5)
Methemoglobin: 1 % (ref 0.0–1.5)
O2 Saturation: 61.7 %
Total hemoglobin: 12.3 g/dL (ref 12.0–16.0)

## 2021-01-02 LAB — MAGNESIUM: Magnesium: 2 mg/dL (ref 1.7–2.4)

## 2021-01-02 LAB — LACTATE DEHYDROGENASE: LDH: 734 U/L — ABNORMAL HIGH (ref 98–192)

## 2021-01-02 LAB — HEPARIN INDUCED PLATELET AB (HIT ANTIBODY): Heparin Induced Plt Ab: 0.104 OD (ref 0.000–0.400)

## 2021-01-02 MED ORDER — DOCUSATE SODIUM 100 MG PO CAPS
100.0000 mg | ORAL_CAPSULE | Freq: Every day | ORAL | Status: DC
  Administered 2021-01-02: 100 mg via ORAL
  Filled 2021-01-02: qty 1

## 2021-01-02 MED ORDER — POTASSIUM CHLORIDE CRYS ER 20 MEQ PO TBCR
40.0000 meq | EXTENDED_RELEASE_TABLET | Freq: Once | ORAL | Status: AC
  Administered 2021-01-02: 40 meq via ORAL
  Filled 2021-01-02: qty 2

## 2021-01-02 MED ORDER — LIP MEDEX EX OINT
TOPICAL_OINTMENT | CUTANEOUS | Status: DC | PRN
  Administered 2021-01-03: 75 via TOPICAL
  Filled 2021-01-02: qty 7

## 2021-01-02 MED ORDER — POLYETHYLENE GLYCOL 3350 17 G PO PACK
17.0000 g | PACK | Freq: Every day | ORAL | Status: DC
  Administered 2021-01-02 – 2021-01-11 (×7): 17 g via ORAL
  Filled 2021-01-02 (×10): qty 1

## 2021-01-02 MED ORDER — SENNA 8.6 MG PO TABS
1.0000 | ORAL_TABLET | Freq: Every day | ORAL | Status: DC
  Administered 2021-01-02: 8.6 mg via ORAL
  Filled 2021-01-02: qty 1

## 2021-01-02 NOTE — Progress Notes (Signed)
ANTICOAGULATION CONSULT NOTE  Pharmacy Consult for Heparin > Bivalirudin Indication:  Impella  CP  Allergies  Allergen Reactions   Ciprofloxacin    Erythromycin    Penicillins    Ampicillin Rash    Mostly topical per daughter    Patient Measurements: Height: 5' (152.4 cm) Weight: 59.9 kg (132 lb 0.9 oz) IBW/kg (Calculated) : 45.5 Heparin Dosing Weight: 58 kg  Vital Signs: Temp: 98.1 F (36.7 C) (10/01 1700) Temp Source: Core (10/01 1600) BP: 97/71 (10/01 1700) Pulse Rate: 78 (10/01 1700)  Labs: Recent Labs    12/31/20 1104 12/31/20 2355 01/01/21 0459 01/01/21 1622 01/01/21 1954 01/02/21 0031 01/02/21 0342 01/02/21 0900 01/02/21 1503 01/02/21 1705  HGB  --   --  9.1*   < > 11.4*  --  11.0*  --  9.8*  --   HCT  --   --  28.2*   < > 34.0*  --  32.8*  --  30.1*  --   PLT  --   --  74*   < > 54*  --  49*  --  38*  --   APTT  --   --   --   --  >200*   < > 51* 51*  --  51*  LABPROT  --   --   --   --  25.4*  --   --   --   --   --   INR  --   --   --   --  2.3*  --   --   --   --   --   HEPARINUNFRC 0.13* 0.17* 0.24*  --   --   --   --   --   --   --   CREATININE  --   --  0.81  --   --   --  0.68  --  0.62  --    < > = values in this interval not displayed.     Estimated Creatinine Clearance: 50 mL/min (by C-G formula based on SCr of 0.62 mg/dL).   Assessment: 62 yoF admitted w/ CP s/p LHC with Impella CP placement. Pharmacy consulted for systemic anticoagulation management.  Heparin initially in impella purge solution and low dose systemic heparin.  Patient with platelet drop 200>54 with increasing LDH 1400 - with impella CP. Concern for HIT. Antibody and SRA ordered and heparin changed to bivalirudan for systemic anticoagulation and D5W purge.  S/p PCI 9/30 afternoon with bivalirudin given in cath lab. aPTT >200 and pt with large amount of bleeding from L groin site post cath. Repeat aPTT down to 60 sec.  Now with aPTT at 51 for 3 consecutive readings. At  lower end of therapeutic range, however given bleeding and that it is for the Impella pump will leave rate as is.   Per RN, no further bleeding.   Goal of Therapy:  Aptt 50-70sec Monitor platelets by anticoagulation protocol: Yes   Plan:  Continue D5W purge solution Continue bivalrudin at 0.025 mg/kg/hr (3.2 ml/hr) Check aPTT q12h given Impella and high bleed risk Daily CBC  Alphia Moh, PharmD, BCPS, BCCP Clinical Pharmacist  Please check AMION for all [facility] phone numbers After 10:00 PM, call Main Pharmacy [phone]

## 2021-01-02 NOTE — Progress Notes (Signed)
Patient ID: Jane Navarro, female   DOB: 1945-09-30, 75 y.o.   MRN: [id number]     Advanced Heart Failure Rounding Note  PCP-Cardiologist: Parke Poisson, MD   Subjective:    - 9/27: NSTEMI-->Cardiogenic shock. Impella CP placed and started on milrinone. Given 2 UPRBCs. Milrinone increased to 0.375 mcg.  - 9/28: Continued on milrinone 0.375 mcg + impella P8. Started on losartan and spiro.  - 9/30:  PCI to RCA overlapping DES x 3, deferred intervention on left system.  Groin site bleeding, femstop applied and 2 units PRBCs given.  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   Remains on Impella CP P-5. Flow 2.5. Good waveforms. No alarms. On milrinone 0.375 mcg/kg/min.  75 cc/hr NS running.   Patient feels better this morning.  Still has femstop on left groin with hematoma.    Swan# s CVP 3-4  PA 23/16  CI 2.01 Co-ox 62%   LDH 1,119>>1,435>>734  LFTs AST 604>>668>>394 ALT 695>>725>>579   Scr stable 0.81>>0.68  Pts continue to trend down, 101>>74>>49, transitioned to bivalirudin.  HIT pending.  Hgb 11   Objective:   Weight Range: 59.9 kg Body mass index is 25.79 kg/m.   Vital Signs:   Temp:  [97.5 F (36.4 C)-101.5 F (38.6 C)] 97.5 F (36.4 C) (10/01 0645) Pulse Rate:  [66-139] 78 (10/01 0645) Resp:  [0-57] 17 (10/01 0645) BP: (86-123)/(60-94) 103/71 (10/01 0630) SpO2:  [0 %-100 %] 99 % (10/01 0645) Arterial Line BP: (86-127)/(65-80) 112/74 (10/01 0645) Weight:  [59.9 kg] 59.9 kg (10/01 0500) Last BM Date: 12/28/20  Weight change: Filed Weights   12/31/20 0655 01/01/21 0420 01/02/21 0500  Weight: 63.2 kg 64.1 kg 59.9 kg    Intake/Output:   Intake/Output Summary (Last 24 hours) at 01/02/2021 0734 Last data filed at 01/02/2021 0600 Gross per 24 hour  Intake 5150.67 ml  Output 1737 ml  Net 3413.67 ml      Physical Exam  CVP 3-4  General: NAD Neck: No JVD, no thyromegaly or thyroid nodule.  Lungs: Clear to auscultation bilaterally with  normal respiratory effort. CV: Nondisplaced PMI.  Heart regular S1/S2, no S3/S4, no murmur.  1+ edema to knees bilaterally.   Abdomen: Soft, nontender, no hepatosplenomegaly, no distention.  Skin: Intact without lesions or rashes.  Neurologic: Alert and oriented x 3.  Psych: Normal affect. Extremities: No clubbing or cyanosis. Hematoma left groin with femstop in place.  HEENT: Normal.    Telemetry   NSR 80s personally reviewed.   EKG    N/A   Labs    CBC Recent Labs    01/01/21 1954 01/02/21 0342  WBC 7.7 7.8  HGB 11.4* 11.0*  HCT 34.0* 32.8*  MCV 89.9 91.9  PLT 54* 49*   Basic Metabolic Panel Recent Labs    93/81/82 0459 01/02/21 0342  NA 138 136  K 3.9 3.8  CL 109 110  CO2 24 20*  GLUCOSE 114* 125*  BUN 30* 22  CREATININE 0.81 0.68  CALCIUM 8.1* 7.7*  MG 2.2 2.0   Liver Function Tests Recent Labs    01/01/21 0459 01/02/21 0342  AST 668* 394*  ALT 725* 579*  ALKPHOS 143* 113  BILITOT 1.8* 1.3*  PROT 4.7* 4.8*  ALBUMIN 2.5* 2.4*   No results for input(s): LIPASE, AMYLASE in the last 72 hours. Cardiac Enzymes No results for input(s): CKTOTAL, CKMB, CKMBINDEX, TROPONINI in the last 72 hours.  BNP: BNP (last 3 results) Recent Labs  12/29/20 0014  BNP >4,500.0*    ProBNP (last 3 results) No results for input(s): PROBNP in the last 8760 hours.   D-Dimer No results for input(s): DDIMER in the last 72 hours. Hemoglobin A1C No results for input(s): HGBA1C in the last 72 hours. Fasting Lipid Panel No results for input(s): CHOL, HDL, LDLCALC, TRIG, CHOLHDL, LDLDIRECT in the last 72 hours. Thyroid Function Tests No results for input(s): TSH, T4TOTAL, T3FREE, THYROIDAB in the last 72 hours.  Invalid input(s): FREET3   Other results:   Imaging    CARDIAC CATHETERIZATION  Result Date: 01/01/2021   Dist LM to Ost LAD lesion is 50% stenosed.   Ost LAD lesion is 70% stenosed.   Mid LAD lesion is 95% stenosed.   Prox LAD lesion is 95%  stenosed.   Dist LAD lesion is 30% stenosed.   Ost Cx lesion is 50% stenosed.   Mid RCA lesion is 90% stenosed.   1st Mrg lesion is 50% stenosed.   RPAV lesion is 30% stenosed with 30% stenosed side branch in 3rd RPL.   RPDA lesion is 20% stenosed.   Prox RCA lesion is 75% stenosed.   Ost RCA lesion is 70% stenosed.   A drug-eluting stent was successfully placed using a STENT ONYX FRONTIER 4.0X15.   A stent was successfully placed.   A drug-eluting stent was successfully placed using a STENT ONYX FRONTIER 4.0X18.   Post intervention, there is a 0% residual stenosis.   Post intervention, there is a 0% residual stenosis.   Post intervention, there is a 0% residual stenosis.  Successful PCI of RCA three overlapping drug-eluting stents with Shockwave lithotripsy and IVUS optimization. Given low platelets, catheter-associated thrombus, and repeated low ACTs despite heparinization, the intervention was performed on bivalirudin; HF and pharmacy teams were briefed on these events and the need to convert to bivalirudin anticoagulation for the indwelling Impella and to perform an evaluation for HITT. Given inability to wire the angulated LAD and the presence of non-flow limiting dissection, any future attempt at percutaneous coronary intervention should be deferred for a few weeks to allow for healing.   ECHOCARDIOGRAM LIMITED  Result Date: 01/01/2021    ECHOCARDIOGRAM LIMITED REPORT   Patient Name:   Jane Navarro Date of Exam: 01/01/2021 Medical Rec #:  [id number]    Height:       60.0 in Accession #:    [id number]   Weight:       141.3 lb Date of Birth:  04-Oct-1945    BSA:          1.610 m Patient Age:    35 years     BP:           98/74 mmHg Patient Gender: F            HR:           86 bpm. Exam Location:  Inpatient Procedure: Limited Echo and Color Doppler           STAT ECHO      Reported to: Hanin Decook Dr. Shirlee Latch at bedside 4:40pm. Indications:    impella placement check  History:        Patient has prior history of  Echocardiogram examinations, most                 recent 01/01/2021.  Sonographer:    Delcie Roch RDCS Referring Phys: 57 Jamarri Vuncannon S Avantika Shere IMPRESSIONS  1. Stat limited echo for impella repositioning - initial measurement of  the impella tip is 4.04 cm across the aortic valve in the LV - no repositioning was noted. Comparison(s): Changes from prior study are noted. 01/01/2021 - cathteter tip was 3.55 cm across the aortic valve this morning. FINDINGS  Left Ventricle: Stat limited echo for impella repositioning - initial measurement of the impella tip is 4.04 cm across the aortic valve in the LV - no repositioning was noted. IVC IVC diam: 2.10 cm Zoila Shutter MD Electronically signed by Zoila Shutter MD Signature Date/Time: 01/01/2021/5:35:08 PM    Final    ECHOCARDIOGRAM LIMITED  Result Date: 01/01/2021    ECHOCARDIOGRAM LIMITED REPORT   Patient Name:   Jane Navarro Date of Exam: 01/01/2021 Medical Rec #:  [id number]    Height:       60.0 in Accession #:    [id number]   Weight:       141.3 lb Date of Birth:  [birth year]    BSA:          1.610 m Patient Age:    74 years     BP:           0/0 mmHg Patient Gender: F            HR:           82 bpm. Exam Location:  Inpatient Procedure: Limited Echo Indications:    impella placement check  History:        Patient has prior history of Echocardiogram examinations, most                 recent 12/29/2020. CAD, chronic kidney disease; Risk                 Factors:Hypertension.  Sonographer:    Delcie Roch RDCS Referring Phys: [id number] BRITTAINY M SIMMONS IMPRESSIONS  1. Limited study for impella repositioning  2. Impella catheter tip noted in the LV cavity - this extends 4.31 cm across the aortic valve. The catheter was subsequently repositioned to 3.55 cm across the valve. Left ventricular ejection fraction, by estimation, is 25 to 30%. The left ventricle has severely decreased function. The left ventricle demonstrates global hypokinesis. Comparison(s): Changes from  prior study are noted. 12/29/2020 - impella position was at 4.15 cm past the aortic valve in the LV. FINDINGS  Left Ventricle: Impella catheter tip noted in the LV cavity - this extends 4.31 cm across the aortic valve. The catheter was subsequently repositioned to 3.55 cm across the valve. Left ventricular ejection fraction, by estimation, is 25 to 30%. The left  ventricle has severely decreased function. The left ventricle demonstrates global hypokinesis. Zoila Shutter MD Electronically signed by Zoila Shutter MD Signature Date/Time: 01/01/2021/10:58:44 AM    Final      Medications:     Scheduled Medications:  aspirin EC  81 mg Oral Daily   atorvastatin  80 mg Oral Daily   chlorhexidine  15 mL Mouth Rinse BID   Chlorhexidine Gluconate Cloth  6 each Topical Daily   digoxin  0.125 mg Oral Daily   docusate sodium  100 mg Oral Daily   ezetimibe  10 mg Oral Daily   feeding supplement  237 mL Oral BID BM   levothyroxine  100 mcg Oral Q0600   losartan  12.5 mg Oral BID   mouth rinse  15 mL Mouth Rinse q12n4p   polyethylene glycol  17 g Oral Daily   senna  1 tablet Oral Daily   sodium chloride flush  10-40 mL Intracatheter  Q12H   sodium chloride flush  3 mL Intravenous Q12H   sodium chloride flush  3 mL Intravenous Q12H   spironolactone  12.5 mg Oral Daily   thiamine  100 mg Oral Daily   ticagrelor  90 mg Oral BID    Infusions:  sodium chloride 75 mL/hr at 01/02/21 0620   sodium chloride     sodium chloride 10 mL/hr at 01/02/21 7616   bivalirudin (ANGIOMAX) infusion 0.5 mg/mL (Non-ACS indications) 0.025 mg/kg/hr (01/02/21 0600)   dextrose 5 % (Impella PURGE) solution 1000 mL bag     milrinone 0.375 mcg/kg/min (01/02/21 0614)   norepinephrine (LEVOPHED) Adult infusion 1 mcg/min (01/02/21 0600)    PRN Medications: sodium chloride, Place/Maintain arterial line **AND** sodium chloride, acetaminophen, fentaNYL (SUBLIMAZE) injection, ondansetron (ZOFRAN) IV, oxyCODONE-acetaminophen, sodium  chloride, sodium chloride flush, sodium chloride flush    Patient Profile   Ms Raap is a 75 year old with history of htn, hyperlipidemia, CVA, hypothyroidism, and CKD Stage IIIa.   Admitted with chest pain--> NSTEMI -->cardiogenic shock    Assessment/Plan  NSTEMI--->CAD severe multivessel diseease-->Cardiogenic Shock - HS Trop 0737->1062. ECHO today EF 25-30% and severely reduced RV function.  - LHC with severe L main with multivessel  proximal LAD and mild RCA .  - In the cath lab PA sat < 40%., CI 1.6.  Placed Impella 2.5 placed and started on milrinone  - Felt too high risk for CABG.  - 9/30 PCI to RCA with DES x 3, deferred intervention on left main with difficulty wiring the LAD and non-flow limiting dissection.  Will plan PCI several weeks down the road to allow healing.   - Continue ASA 81, ticagrelor, atorvastatin.   2.  Cardiogenic shock: Ischemic cardiomyopathy.  Echo with EF 25-30%, severely decreased RV function, moderate-severe TR, moderate MR. This morning with CVP 3-4, CI 2.01, co-ox 62%.  - She remains on milrinone 0.375, off NE.   - Impella at P5 with no alarms.  Good position by echo last night.  She is not going to be ready to get Impella out today with marginal CO but will drop down to P4 with low platelets (HIT versus hemolysis).  On bivalirudin with concern for HIT.  - Continue digoxin 0.125 mcg  - Continue 12.5 mg losartan bid and 12.5 mg spironolactone.  - Renal function stable.  - No diuretic with low CVP, can continue NS 75 cc/hr for 4-5 more hours then stop now that she is eating/drinking normally.   2. Shock Liver  - LFTs now trending down.    3. Anemia  - Hgb dropped to 6.5 initially>> given 2UPRBCS with appropriate rise to 8.9.  - 1U RBCs given 9/29, Hgb 7.6>>9.1 - Significant hematoma left groin post-intervention, had 2 units PRBCs last night with appropriate increase in hgb to 11 today.  Site stable, back on bivalirudin.  - CBC in pm.    4.  Hypothyroidism  -TSH 4.8  -Continue levothyroxine.    5. UTI  - Urine culture NGTD - Completed course of rocephin    6. H/O CVA  7. Renal: Creatinine stable.   8. Thrombocytopenia: Plts down to 49K this morning.  Concern for HIT versus thrombolysis from Impella versus both.  LDH is now trending down which is a good sign re: thrombolysis.  - Decrease Impella to P4 but do not think she is ready to get it out with marginal CO this morning.  - HIT pending, now on bivalirudin.  - Repeat CBC  in pm.   CRITICAL CARE Performed by: Marca Ancona  Total critical care time: 40 minutes  Critical care time was exclusive of separately billable procedures and treating other patients.  Critical care was necessary to treat or prevent imminent or life-threatening deterioration.  Critical care was time spent personally by me on the following activities: development of treatment plan with patient and/or surrogate as well as nursing, discussions with consultants, evaluation of patient's response to treatment, examination of patient, obtaining history from patient or surrogate, ordering and performing treatments and interventions, ordering and review of laboratory studies, ordering and review of radiographic studies, pulse oximetry and re-evaluation of patient's condition.  Marca Ancona 01/02/2021 7:34 AM

## 2021-01-02 NOTE — Progress Notes (Signed)
NAME:  Jane Navarro, MRN:  [id number], DOB:  December 31, 1945, LOS: 4 ADMISSION DATE:  12/28/2020, CONSULTATION DATE:  01/01/2021 REFERRING MD:  Shirlee Latch, CHIEF COMPLAINT:  hemorrhagic shock   History of Present Illness:  75 year old woman who developed hypotension felt to be due to bleeding at PCI groin access site.   She originally presented on 9/03 with AMS due to severe hypothyroidism.  She presented again on 9/26 with complaints of retrosternal chest pain and she ruled in for a non-STEMI.  Found to have an ischemic cardiomyopathy with an EF of 25-30% and grade 2 diastolic dysfunction.  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  A CP Impella was placed and she was started on milrinone with improvement in signs of congestion.  Deemed to be poor candidate for surgery, so underwent staged multivessel PCI.  Pertinent  Medical History   Past Medical History:  Diagnosis Date   Anemia    CVA (cerebral vascular accident) (HCC)    HLD (hyperlipidemia)    Hypothyroidism    Tobacco abuse, in remission      Significant Hospital Events: Including procedures, antibiotic start and stop dates in addition to other pertinent events   9/30 -successful deployment of overlapping RCA stents.  Received intraprocedural bivalirudin. 9/30 subsequent hypotension in ICU with evidence of bleeding at the site  Interim History / Subjective:  No further bleeding at site.  FemoStop pressure being released gradually.  Objective   Blood pressure 103/78, pulse 76, temperature 98.2 F (36.8 C), resp. rate 15, height 5' (1.524 m), weight 59.9 kg, SpO2 99 %. PAP: (12-46)/(3-18) 23/18 CVP:  [0 mmHg-8 mmHg] 4 mmHg CO:  [3.2 L/min-3.3 L/min] 3.3 L/min CI:  [2 L/min/m2] 2 L/min/m2      Intake/Output Summary (Last 24 hours) at 01/02/2021 0957 Last data filed at 01/02/2021 0900 Gross per 24 hour  Intake 5271.27 ml  Output 1697 ml  Net 3574.27 ml    Filed Weights   12/31/20  0655 01/01/21 0420 01/02/21 0500  Weight: 63.2 kg 64.1 kg 59.9 kg    Examination: General: Elderly frail appearing woman HENT: Dry mucous membranes Lungs: Chest clear Cardiovascular: Heart sounds distant but normal.  Extremities warm capillary refill 3 to 6 seconds Abdomen: Abdomen is soft Extremities: Right groin introducer with Swan.  Left groin bleeding from percutaneous site with swelling and ecchymoses of labia consistent with bleeding.  Distal pulses dopplerable. Neuro: Sensorium intact.  No focal neurological deficits. GU: Clear urine.  Resolved Hospital Problem list   None.  Assessment & Plan:  Critically ill due to hemorrhagic shock requiring titration of vasopressors and administration of blood products. Critically ill due to decompensated diastolic heart failure and cardiogenic shock requiring titration of milrinone and Impella support due to ischemic cardiomyopathy Coronary artery disease status post PCI RCA. Thrombocytopenia likely related to hemolysis from CP Impella.  Was normal on admission Severe hypothyroidism-now treated AKI has resolved Resolving shock liver. Acute on chronic anemia normocytic normochromic anemia  Presence of CP Impella with hemolysis and platelet consumption, likely also to have acquired von Willebrand by same mechanism. Also received Ticagrelor and bivalirudin.  Improving hemostasis with pressure.  Plan: -Remove pressure on groin and observe for further bleeding -Hold on further blood transfusions at this time. -Titrate blood products and norepinephrine to keep systolic blood pressure 90-110 -Continue milrinone and CP Impella at current settings maintain normal SCV O2 -Continue to follow creatinine following contrast load   Best Practice (  right click and "Reselect all SmartList Selections" daily)   Diet/type: Cardiac diet DVT prophylaxis: Bivalirudin GI prophylaxis: N/A Lines: Central line Foley:  Yes, and it is still needed Code  Status:  full code Last date of multidisciplinary goals of care discussion [updated daughter at the bedside]  Labs   CBC: Recent Labs  Lab 12/29/20 0714 12/29/20 1038 12/31/20 0436 01/01/21 0459 01/01/21 1622 01/01/21 1954 01/02/21 0342  WBC 12.0*   < > 7.7 6.9 6.8 7.7 7.8  NEUTROABS 10.0*  --   --   --   --   --   --   HGB 7.4*   < > 7.6* 9.1* 8.8* 11.4* 11.0*  HCT 23.8*   < > 24.0* 28.2* 28.2* 34.0* 32.8*  MCV 102.1*   < > 97.2 94.0 97.6 89.9 91.9  PLT 270   < > 101* 74* 64* 54* 49*   < > = values in this interval not displayed.     Basic Metabolic Panel: Recent Labs  Lab 12/30/20 0423 12/30/20 1354 12/30/20 2152 12/31/20 0436 01/01/21 0459 01/02/21 0342  NA 143 142 141 140 138 136  K 2.8* 3.4* 4.2 4.4 3.9 3.8  CL 108 110 110 111 109 110  CO2 28 29 27 25 24  20*  GLUCOSE 116* 148* 120* 103* 114* 125*  BUN 60* 52* 49* 41* 30* 22  CREATININE 1.20* 1.13* 1.07* 0.95 0.81 0.68  CALCIUM 8.2* 8.3* 8.2* 8.1* 8.1* 7.7*  MG 2.6*  --  2.6* 2.4 2.2 2.0    GFR: Estimated Creatinine Clearance: 50 mL/min (by C-G formula based on SCr of 0.68 mg/dL). Recent Labs  Lab 12/29/20 0714 12/29/20 1510 12/29/20 1520 12/29/20 2116 12/30/20 0423 12/31/20 0436 01/01/21 0459 01/01/21 1622 01/01/21 1954 01/02/21 0342  PROCALCITON 4.12  --   --   --  3.88 2.51  --   --   --   --   WBC 12.0*  --    < >  --  9.0 7.7 6.9 6.8 7.7 7.8  LATICACIDVEN  --  2.5*  --  1.8  --   --   --   --   --   --    < > = values in this interval not displayed.     Liver Function Tests: Recent Labs  Lab 12/29/20 0014 12/30/20 0423 12/31/20 0631 01/01/21 0459 01/02/21 0342  AST 2,238* 1,275* 604* 668* 394*  ALT 1,270* 958* 695* 725* 579*  ALKPHOS 279* 199* 154* 143* 113  BILITOT 1.9* 2.6* 1.1 1.8* 1.3*  PROT 6.7 5.3* 4.9* 4.7* 4.8*  ALBUMIN 3.7 2.9* 2.4* 2.5* 2.4*    No results for input(s): LIPASE, AMYLASE in the last 168 hours. Recent Labs  Lab 12/29/20 0014  AMMONIA 12     ABG     Component Value Date/Time   PHART 7.409 12/29/2020 1227   PCO2ART 29.4 (L) 12/29/2020 1227   PO2ART 76 (L) 12/29/2020 1227   HCO3 18.6 (L) 12/29/2020 1227   TCO2 19 (L) 12/29/2020 1227   ACIDBASEDEF 6.0 (H) 12/29/2020 1227   O2SAT 61.7 01/02/2021 0342      Coagulation Profile: Recent Labs  Lab 01/01/21 1954  INR 2.3*    Cardiac Enzymes: No results for input(s): CKTOTAL, CKMB, CKMBINDEX, TROPONINI in the last 168 hours.  HbA1C: Hgb A1c MFr Bld  Date/Time Value Ref Range Status  12/05/2020 07:56 PM 5.5 4.8 - 5.6 % Final    Comment:    (NOTE)  Prediabetes: 5.7 - 6.4         Diabetes: >6.4         Glycemic control for adults with diabetes: <7.0     CBG: No results for input(s): GLUCAP in the last 168 hours.  CRITICAL CARE Performed by: Lynnell Catalan   Total critical care time: 35 minutes  Critical care time was exclusive of separately billable procedures and treating other patients.  Critical care was necessary to treat or prevent imminent or life-threatening deterioration.  Critical care was time spent personally by me on the following activities: development of treatment plan with patient and/or surrogate as well as nursing, discussions with consultants, evaluation of patient's response to treatment, examination of patient, obtaining history from patient or surrogate, ordering and performing treatments and interventions, ordering and review of laboratory studies, ordering and review of radiographic studies, pulse oximetry, re-evaluation of patient's condition and participation in multidisciplinary rounds.  Lynnell Catalan, MD [facility] ICU Physician [facility]  Pager: [phone] Mobile: [phone] After hours: [phone].

## 2021-01-02 NOTE — Progress Notes (Addendum)
ANTICOAGULATION CONSULT NOTE  Pharmacy Consult for Heparin > Bivalirudin Indication:  Impella  CP  Allergies  Allergen Reactions   Ciprofloxacin    Erythromycin    Penicillins    Ampicillin Rash    Mostly topical per daughter    Patient Measurements: Height: 5' (152.4 cm) Weight: 64.1 kg (141 lb 5 oz) IBW/kg (Calculated) : 45.5 Heparin Dosing Weight: 58 kg  Vital Signs: Temp: 98.1 F (36.7 C) (10/01 0100) Temp Source: Core (10/01 0000) BP: 120/89 (10/01 0100) Pulse Rate: 84 (10/01 0100)  Labs: Recent Labs    12/30/20 2152 12/31/20 0436 12/31/20 1104 12/31/20 2355 01/01/21 0459 01/01/21 1622 01/01/21 1954 01/02/21 0031  HGB  --  7.6*  --   --  9.1* 8.8* 11.4*  --   HCT  --  24.0*  --   --  28.2* 28.2* 34.0*  --   PLT  --  101*  --   --  74* 64* 54*  --   APTT  --   --   --   --   --   --  >200* 60*  LABPROT  --   --   --   --   --   --  25.4*  --   INR  --   --   --   --   --   --  2.3*  --   HEPARINUNFRC 0.20* 0.17* 0.13* 0.17* 0.24*  --   --   --   CREATININE 1.07* 0.95  --   --  0.81  --   --   --      Estimated Creatinine Clearance: 50.9 mL/min (by C-G formula based on SCr of 0.81 mg/dL).   Assessment: Jane Navarro admitted w/ CP s/p LHC with Impella CP placement. Pharmacy consulted for systemic anticoagulation management.  Heparin initially in impella purge solution and low dose systemic heparin.  Patient with platelet drop 200>54 with increasing LDH 1400 - with impella CP. Concern for HIT. Antibody and SRA ordered and heparin changed to bivalirudan for systemic anticoagulation and D5W purge.  S/p PCI 9/30 afternoon with bivalirudin given in cath lab. aPTT >200 and pt with large amount of bleeding from L groin site post cath. Repeat aPTT down to 60 sec. RN reports pt still with oozing from L groin site and has femstop in.   Goal of Therapy:  Aptt 50-70sec Monitor platelets by anticoagulation protocol: Yes   Plan:  Continue D5W purge solution F/u 0300  aPTT and bleeding at this time, likely start low dose bivalirudin if aPTT < 60 sec and if bleeding stable/resolved.  Christoper Fabian, PharmD, BCPS Please see amion for complete clinical pharmacist phone list 01/02/2021 1:18 AM   Addendum (0445) aPTT now down to 51 sec (low end of therapeutic range). RN reports still oozing from L groin site but seems to be lessening and able to loosen the femstop. D/w Dr. Arsenio Loader and will start bivalirudin with cautious dosing and close monitoring.  Plan: Start bivalirudin at 0.025 mg/kg/hr (3.2 ml/hr) F/u 4 hr aPTT  Christoper Fabian, PharmD, BCPS Please see amion for complete clinical pharmacist phone list 01/02/2021 4:44 AM

## 2021-01-02 NOTE — Progress Notes (Signed)
ANTICOAGULATION CONSULT NOTE  Pharmacy Consult for Heparin > Bivalirudin Indication:  Impella  CP  Allergies  Allergen Reactions   Ciprofloxacin    Erythromycin    Penicillins    Ampicillin Rash    Mostly topical per daughter    Patient Measurements: Height: 5' (152.4 cm) Weight: 59.9 kg (132 lb 0.9 oz) IBW/kg (Calculated) : 45.5 Heparin Dosing Weight: 58 kg  Vital Signs: Temp: 98.2 F (36.8 C) (10/01 0900) Temp Source: Core (10/01 0800) BP: 103/78 (10/01 0900) Pulse Rate: 76 (10/01 0900)  Labs: Recent Labs    12/31/20 0436 12/31/20 1104 12/31/20 2355 01/01/21 0459 01/01/21 1622 01/01/21 1622 01/01/21 1954 01/02/21 0031 01/02/21 0342 01/02/21 0900  HGB 7.6*  --   --  9.1* 8.8*  --  11.4*  --  11.0*  --   HCT 24.0*  --   --  28.2* 28.2*  --  34.0*  --  32.8*  --   PLT 101*  --   --  74* 64*  --  54*  --  49*  --   APTT  --   --   --   --   --    < > >200* 60* 51* 51*  LABPROT  --   --   --   --   --   --  25.4*  --   --   --   INR  --   --   --   --   --   --  2.3*  --   --   --   HEPARINUNFRC 0.17* 0.13* 0.17* 0.24*  --   --   --   --   --   --   CREATININE 0.95  --   --  0.81  --   --   --   --  0.68  --    < > = values in this interval not displayed.     Estimated Creatinine Clearance: 50 mL/min (by C-G formula based on SCr of 0.68 mg/dL).   Assessment: 69 yoF admitted w/ CP s/p LHC with Impella CP placement. Pharmacy consulted for systemic anticoagulation management.  Heparin initially in impella purge solution and low dose systemic heparin.  Patient with platelet drop 200>54 with increasing LDH 1400 - with impella CP. Concern for HIT. Antibody and SRA ordered and heparin changed to bivalirudan for systemic anticoagulation and D5W purge.  S/p PCI 9/30 afternoon with bivalirudin given in cath lab. aPTT >200 and pt with large amount of bleeding from L groin site post cath. Repeat aPTT down to 60 sec.  Now with aPTT at 51 for 2 consecutive readings. At  lower end of therapeutic range, however given bleeding and that it is for the Impella pump will leave rate as is.   Per RN, bleeding is improved and more of oozing now. LDH down from 1435 to 734. Hemoglobin at 11.4, but platelets low at 54.  Goal of Therapy:  Aptt 50-70sec Monitor platelets by anticoagulation protocol: Yes   Plan:  Continue D5W purge solution Continue bivalrudin at 0.025 mg/kg/hr (3.2 ml/hr) Check aPTT q12h given Impella and high bleed risk Daily CBC  Drake Leach, PharmD PGY2 Cardiology Pharmacy Resident Phone: [phone]  01/02/2021  9:48 AM  Please check [email or web address] for unit-specific pharmacy phone numbers.

## 2021-01-03 DIAGNOSIS — R579 Shock, unspecified: Secondary | ICD-10-CM | POA: Diagnosis not present

## 2021-01-03 DIAGNOSIS — R570 Cardiogenic shock: Secondary | ICD-10-CM | POA: Diagnosis not present

## 2021-01-03 DIAGNOSIS — I214 Non-ST elevation (NSTEMI) myocardial infarction: Secondary | ICD-10-CM | POA: Diagnosis not present

## 2021-01-03 LAB — BASIC METABOLIC PANEL
Anion gap: 5 (ref 5–15)
Anion gap: 3 — ABNORMAL LOW (ref 5–15)
BUN: 16 mg/dL (ref 8–23)
BUN: 16 mg/dL (ref 8–23)
CO2: 21 mmol/L — ABNORMAL LOW (ref 22–32)
CO2: 22 mmol/L (ref 22–32)
Calcium: 7.7 mg/dL — ABNORMAL LOW (ref 8.9–10.3)
Calcium: 7.7 mg/dL — ABNORMAL LOW (ref 8.9–10.3)
Chloride: 110 mmol/L (ref 98–111)
Chloride: 111 mmol/L (ref 98–111)
Creatinine, Ser: 0.61 mg/dL (ref 0.44–1.00)
Creatinine, Ser: 0.66 mg/dL (ref 0.44–1.00)
GFR, Estimated: >60 mL/min (ref >60)
GFR, Estimated: >60 mL/min (ref >60)
Glucose, Bld: 99 mg/dL (ref 70–99)
Glucose, Bld: 99 mg/dL (ref 70–99)
Potassium: 3.9 mmol/L (ref 3.5–5.1)
Potassium: 4 mmol/L (ref 3.5–5.1)
Sodium: 136 mmol/L (ref 135–145)
Sodium: 136 mmol/L (ref 135–145)

## 2021-01-03 LAB — CBC
HCT: 29.6 % — ABNORMAL LOW (ref 36.0–46.0)
HCT: 28.9 % — ABNORMAL LOW (ref 36.0–46.0)
Hemoglobin: 9.8 g/dL — ABNORMAL LOW (ref 12.0–15.0)
Hemoglobin: 9.3 g/dL — ABNORMAL LOW (ref 12.0–15.0)
MCH: 30.8 pg (ref 26.0–34.0)
MCH: 31 pg (ref 26.0–34.0)
MCHC: 33.1 g/dL (ref 30.0–36.0)
MCHC: 32.2 g/dL (ref 30.0–36.0)
MCV: 93.1 fL (ref 80.0–100.0)
MCV: 96.3 fL (ref 80.0–100.0)
Platelets: 35 10*3/uL — ABNORMAL LOW (ref 150–400)
Platelets: 33 10*3/uL — ABNORMAL LOW (ref 150–400)
RBC: 3.18 MIL/uL — ABNORMAL LOW (ref 3.87–5.11)
RBC: 3 MIL/uL — ABNORMAL LOW (ref 3.87–5.11)
RDW: 16.5 % — ABNORMAL HIGH (ref 11.5–15.5)
RDW: 16.4 % — ABNORMAL HIGH (ref 11.5–15.5)
WBC: 8.4 10*3/uL (ref 4.0–10.5)
WBC: 7.6 10*3/uL (ref 4.0–10.5)
nRBC: 0 % (ref 0.0–0.2)
nRBC: 0 % (ref 0.0–0.2)

## 2021-01-03 LAB — COOXEMETRY PANEL
Carboxyhemoglobin: 1.8 % — ABNORMAL HIGH (ref 0.5–1.5)
Methemoglobin: 0.9 % (ref 0.0–1.5)
O2 Saturation: 69.7 %
Total hemoglobin: 9.9 g/dL — ABNORMAL LOW (ref 12.0–16.0)

## 2021-01-03 LAB — CULTURE, BLOOD (ROUTINE X 2)
Culture: NO GROWTH
Culture: NO GROWTH

## 2021-01-03 LAB — LACTATE DEHYDROGENASE: LDH: 492 U/L — ABNORMAL HIGH (ref 98–192)

## 2021-01-03 LAB — MAGNESIUM: Magnesium: 2 mg/dL (ref 1.7–2.4)

## 2021-01-03 LAB — APTT
aPTT: 53 seconds — ABNORMAL HIGH (ref 24–36)
aPTT: 48 seconds — ABNORMAL HIGH (ref 24–36)

## 2021-01-03 MED ORDER — SODIUM CHLORIDE 0.9 % IV SOLN: INTRAVENOUS | Status: AC

## 2021-01-03 MED ORDER — DOCUSATE SODIUM 100 MG PO CAPS
200.0000 mg | ORAL_CAPSULE | Freq: Every day | ORAL | Status: DC
  Administered 2021-01-03 – 2021-01-11 (×9): 200 mg via ORAL
  Filled 2021-01-03 (×9): qty 2

## 2021-01-03 MED ORDER — SENNA 8.6 MG PO TABS
2.0000 | ORAL_TABLET | Freq: Every day | ORAL | Status: DC
  Administered 2021-01-03 – 2021-01-08 (×5): 17.2 mg via ORAL
  Administered 2021-01-09: 8.6 mg via ORAL
  Administered 2021-01-10 – 2021-01-11 (×2): 17.2 mg via ORAL
  Filled 2021-01-03 (×8): qty 2

## 2021-01-03 MED ORDER — POTASSIUM CHLORIDE CRYS ER 20 MEQ PO TBCR
20.0000 meq | EXTENDED_RELEASE_TABLET | Freq: Once | ORAL | Status: AC
  Administered 2021-01-03: 20 meq via ORAL
  Filled 2021-01-03: qty 1

## 2021-01-03 NOTE — Progress Notes (Signed)
ANTICOAGULATION CONSULT NOTE  Pharmacy Consult for Heparin > Bivalirudin Indication:  Impella  CP  Allergies  Allergen Reactions   Ciprofloxacin    Erythromycin    Penicillins    Ampicillin Rash    Mostly topical per daughter    Patient Measurements: Height: 5' (152.4 cm) Weight: 61.2 kg (134 lb 14.7 oz) IBW/kg (Calculated) : 45.5 Heparin Dosing Weight: 58 kg  Vital Signs: Temp: 98.4 F (36.9 C) (10/02 1700) Temp Source: Core (10/02 1600) BP: 90/64 (10/02 1700) Pulse Rate: 79 (10/02 1700)  Labs: Recent Labs     0000 12/31/20 2355 01/01/21 0459 01/01/21 1622 01/01/21 1954 01/02/21 0031 01/02/21 0342 01/02/21 0900 01/02/21 1503 01/02/21 1705 01/03/21 0434 01/03/21 1652  HGB  --   --  9.1*   < > 11.4*  --  11.0*  --  9.8*  --  9.8*  --   HCT  --   --  28.2*   < > 34.0*  --  32.8*  --  30.1*  --  29.6*  --   PLT  --   --  74*   < > 54*  --  49*  --  38*  --  35*  --   APTT  --   --   --   --  >200*   < > 51*   < >  --  51* 53* 48*  LABPROT  --   --   --   --  25.4*  --   --   --   --   --   --   --   INR  --   --   --   --  2.3*  --   --   --   --   --   --   --   HEPARINUNFRC  --  0.17* 0.24*  --   --   --   --   --   --   --   --   --   CREATININE   < >  --  0.81  --   --   --  0.68  --  0.62  --  0.61  --    < > = values in this interval not displayed.     Estimated Creatinine Clearance: 50.5 mL/min (by C-G formula based on SCr of 0.61 mg/dL).   Assessment: 88 yoF admitted w/ CP s/p LHC with Impella CP placement. Pharmacy consulted for systemic anticoagulation management.  Heparin initially in impella purge solution and low dose systemic heparin.  Patient with platelet drop 200>54 with increasing LDH 1400 - with impella CP. Concern for HIT. Antibody and SRA ordered and heparin changed to bivalirudan for systemic anticoagulation and D5W purge.  S/p PCI 9/30 afternoon with bivalirudin given in cath lab. aPTT >200 and pt with large amount of bleeding from L  groin site post cath. Repeat aPTT down to 60 sec.  Now with aPTT at 48, just subtherapeutic. Given recent bleeding, stable aPTTs on current rate and indication is for Impella pump, will leave rate as is.    Goal of Therapy:  Aptt 50-70sec Monitor platelets by anticoagulation protocol: Yes   Plan:  Continue D5W purge solution Continue bivalrudin at 0.025 mg/kg/hr (3.2 ml/hr) Check aPTT q12h given Impella and high bleed risk Daily CBC  Alphia Moh, PharmD, BCPS, BCCP Clinical Pharmacist  Please check AMION for all [facility] Pharmacy phone numbers After 10:00 PM, call Main Pharmacy [phone]

## 2021-01-03 NOTE — Progress Notes (Signed)
NAME:  Jane Navarro, MRN:  [id number], DOB:  1945/09/26, LOS: 5 ADMISSION DATE:  12/28/2020, CONSULTATION DATE:  01/01/2021 REFERRING MD:  Shirlee Latch, CHIEF COMPLAINT:  hemorrhagic shock   History of Present Illness:  75 year old woman who developed hypotension felt to be due to bleeding at PCI groin access site.   She originally presented on 9/03 with AMS due to severe hypothyroidism.  She presented again on 9/26 with complaints of retrosternal chest pain and she ruled in for a non-STEMI.  Found to have an ischemic cardiomyopathy with an EF of 25-30% and grade 2 diastolic dysfunction.  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  A CP Impella was placed and she was started on milrinone with improvement in signs of congestion.  Deemed to be poor candidate for surgery, so underwent staged multivessel PCI.  Pertinent  Medical History   Past Medical History:  Diagnosis Date   Anemia    CVA (cerebral vascular accident) (HCC)    HLD (hyperlipidemia)    Hypothyroidism    Tobacco abuse, in remission      Significant Hospital Events: Including procedures, antibiotic start and stop dates in addition to other pertinent events   9/30 -successful deployment of overlapping RCA stents.  Received intraprocedural bivalirudin. 9/30 subsequent hypotension in ICU with evidence of bleeding at the site  Interim History / Subjective:  No further bleeding at site.  Denies chest pain or shortness of breath.  Objective   Blood pressure (!) 87/64, pulse 85, temperature 98.8 F (37.1 C), resp. rate 18, height 5' (1.524 m), weight 61.2 kg, SpO2 97 %. PAP: (16-28)/(6-19) 25/18 CVP:  [1 mmHg-89 mmHg] 1 mmHg CO:  [3.7 L/min-4.9 L/min] 3.8 L/min CI:  [2.3 L/min/m2-3.1 L/min/m2] 2.4 L/min/m2      Intake/Output Summary (Last 24 hours) at 01/03/2021 1428 Last data filed at 01/03/2021 1400 Gross per 24 hour  Intake 1446.77 ml  Output 950 ml  Net 496.77 ml    Filed  Weights   01/01/21 0420 01/02/21 0500 01/03/21 0530  Weight: 64.1 kg 59.9 kg 61.2 kg    Examination: General: Elderly frail appearing woman HENT: Dry mucous membranes Lungs: Chest clear Cardiovascular: Heart sounds distant but normal.  Extremities warm capillary refill 3 to 6 seconds Abdomen: Abdomen is soft Extremities: Right groin introducer with Swan.  No further left groin bleeding.  Distal pulses dopplerable.  Bilateral lower extremity edema Neuro: Sensorium intact.  No focal neurological deficits. GU: Clear urine.  Resolved Hospital Problem list   None.  Assessment & Plan:  Critically ill due to hemorrhagic shock requiring titration of vasopressors and administration of blood products. Critically ill due to decompensated diastolic heart failure and cardiogenic shock requiring titration of milrinone and Impella support due to ischemic cardiomyopathy Coronary artery disease status post PCI RCA. Thrombocytopenia likely related to hemolysis from CP Impella.  Was normal on admission Severe hypothyroidism-now treated AKI has resolved Resolving shock liver. Acute on chronic anemia normocytic normochromic anemia  Presence of CP Impella with hemolysis and platelet consumption, likely also to have acquired von Willebrand by same mechanism. Also received Ticagrelor and bivalirudin.  Improving hemostasis with pressure.  Plan: -For Impella removal tomorrow. -Allow spontaneous diuresis.  Filling pressures are acceptable. -Hold on further blood transfusions at this time. -Titrate blood products and norepinephrine to keep systolic blood pressure 90-110 -Continue milrinone and CP Impella at current settings maintain normal SCV O2 -Continue to follow platelet count.   Best Practice (  right click and "Reselect all SmartList Selections" daily)   Diet/type: Cardiac diet DVT prophylaxis: On hold due to thrombocytopenia GI prophylaxis: N/A Lines: Central line Foley:  Yes, and it is still  needed Code Status:  full code Last date of multidisciplinary goals of care discussion [updated daughter at the bedside]  Labs   CBC: Recent Labs  Lab 12/29/20 0714 12/29/20 1038 01/01/21 1622 01/01/21 1954 01/02/21 0342 01/02/21 1503 01/03/21 0434  WBC 12.0*   < > 6.8 7.7 7.8 8.2 8.4  NEUTROABS 10.0*  --   --   --   --   --   --   HGB 7.4*   < > 8.8* 11.4* 11.0* 9.8* 9.8*  HCT 23.8*   < > 28.2* 34.0* 32.8* 30.1* 29.6*  MCV 102.1*   < > 97.6 89.9 91.9 93.5 93.1  PLT 270   < > 64* 54* 49* 38* 35*   < > = values in this interval not displayed.     Basic Metabolic Panel: Recent Labs  Lab 12/30/20 2152 12/31/20 0436 01/01/21 0459 01/02/21 0342 01/02/21 1503 01/03/21 0434  NA 141 140 138 136 136 136  K 4.2 4.4 3.9 3.8 3.6 3.9  CL 110 111 109 110 112* 110  CO2 27 25 24  20* 21* 21*  GLUCOSE 120* 103* 114* 125* 89 99  BUN 49* 41* 30* 22 19 16   CREATININE 1.07* 0.95 0.81 0.68 0.62 0.61  CALCIUM 8.2* 8.1* 8.1* 7.7* 7.6* 7.7*  MG 2.6* 2.4 2.2 2.0  --  2.0    GFR: Estimated Creatinine Clearance: 50.5 mL/min (by C-G formula based on SCr of 0.61 mg/dL). Recent Labs  Lab 12/29/20 0714 12/29/20 1510 12/29/20 1520 12/29/20 2116 12/30/20 0423 12/31/20 0436 01/01/21 0459 01/01/21 1954 01/02/21 0342 01/02/21 1503 01/03/21 0434  PROCALCITON 4.12  --   --   --  3.88 2.51  --   --   --   --   --   WBC 12.0*  --    < >  --  9.0 7.7   < > 7.7 7.8 8.2 8.4  LATICACIDVEN  --  2.5*  --  1.8  --   --   --   --   --   --   --    < > = values in this interval not displayed.     Liver Function Tests: Recent Labs  Lab 12/29/20 0014 12/30/20 0423 12/31/20 0631 01/01/21 0459 01/02/21 0342  AST 2,238* 1,275* 604* 668* 394*  ALT 1,270* 958* 695* 725* 579*  ALKPHOS 279* 199* 154* 143* 113  BILITOT 1.9* 2.6* 1.1 1.8* 1.3*  PROT 6.7 5.3* 4.9* 4.7* 4.8*  ALBUMIN 3.7 2.9* 2.4* 2.5* 2.4*    No results for input(s): LIPASE, AMYLASE in the last 168 hours. Recent Labs  Lab  12/29/20 0014  AMMONIA 12     ABG    Component Value Date/Time   PHART 7.409 12/29/2020 1227   PCO2ART 29.4 (L) 12/29/2020 1227   PO2ART 76 (L) 12/29/2020 1227   HCO3 18.6 (L) 12/29/2020 1227   TCO2 19 (L) 12/29/2020 1227   ACIDBASEDEF 6.0 (H) 12/29/2020 1227   O2SAT 69.7 01/03/2021 0434      Coagulation Profile: Recent Labs  Lab 01/01/21 1954  INR 2.3*     Cardiac Enzymes: No results for input(s): CKTOTAL, CKMB, CKMBINDEX, TROPONINI in the last 168 hours.  HbA1C: Hgb A1c MFr Bld  Date/Time Value Ref Range Status  12/05/2020 07:56 PM 5.5  4.8 - 5.6 % Final    Comment:    (NOTE)         Prediabetes: 5.7 - 6.4         Diabetes: >6.4         Glycemic control for adults with diabetes: <7.0     CBG: Recent Labs  Lab 01/02/21 1136 01/02/21 1637  GLUCAP 115* 82    CRITICAL CARE Performed by: Lynnell Catalan   Total critical care time: 35 minutes  Critical care time was exclusive of separately billable procedures and treating other patients.  Critical care was necessary to treat or prevent imminent or life-threatening deterioration.  Critical care was time spent personally by me on the following activities: development of treatment plan with patient and/or surrogate as well as nursing, discussions with consultants, evaluation of patient's response to treatment, examination of patient, obtaining history from patient or surrogate, ordering and performing treatments and interventions, ordering and review of laboratory studies, ordering and review of radiographic studies, pulse oximetry, re-evaluation of patient's condition and participation in multidisciplinary rounds.  Lynnell Catalan, MD [facility] ICU Physician [facility]  Pager: [phone] Mobile: [phone] After hours: [phone].

## 2021-01-03 NOTE — Progress Notes (Signed)
Patient ID: Jane Navarro, female   DOB: 04-24-1945, 75 y.o.   MRN: [id number]     Advanced Heart Failure Rounding Note  PCP-Cardiologist: Parke Poisson, MD   Subjective:    - 9/27: NSTEMI-->Cardiogenic shock. Impella CP placed and started on milrinone. Given 2 UPRBCs. Milrinone increased to 0.375 mcg.  - 9/28: Continued on milrinone 0.375 mcg + impella P8. Started on losartan and spiro.  - 9/30:  PCI to RCA overlapping DES x 3, deferred intervention on left system.  Groin site bleeding, femstop applied and 2 units PRBCs given.  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   Remains on Impella CP P-4. Flow 2.3. Good waveforms. On milrinone 0.375 mcg/kg/min.  She was on NE 1 overnight but this has been stopped.  Clear urine, no obvious evidence for hemolysis.   Femstop off, stable hematoma left groin.   Patient feels better this morning.  No dyspnea or chest pain.    Swan# s CVP 3 PA 24/15  CI 2.5 Co-ox 70%  LDH 1,119>>1,435>>734>>492  LFTs AST 604>>668>>394 ALT 695>>725>>579   Scr stable 0.81>>0.68>>0.61  Platelets 35K today, rate of drop seems to have nadired.  Preliminary HIT negative, pending SRA. On bivalirudin.  Hgb 9.8   Objective:   Weight Range: 61.2 kg Body mass index is 26.35 kg/m.   Vital Signs:   Temp:  [97.9 F (36.6 C)-98.4 F (36.9 C)] 98.4 F (36.9 C) (10/02 0700) Pulse Rate:  [69-88] 86 (10/02 0700) Resp:  [11-21] 18 (10/02 0700) BP: (85-118)/(66-85) 116/77 (10/02 0700) SpO2:  [96 %-100 %] 98 % (10/02 0700) Arterial Line BP: (98-136)/(65-87) 117/70 (10/02 0700) Weight:  [61.2 kg] 61.2 kg (10/02 0530) Last BM Date: 12/28/20  Weight change: Filed Weights   01/01/21 0420 01/02/21 0500 01/03/21 0530  Weight: 64.1 kg 59.9 kg 61.2 kg    Intake/Output:   Intake/Output Summary (Last 24 hours) at 01/03/2021 0752 Last data filed at 01/03/2021 0700 Gross per 24 hour  Intake 1564.53 ml  Output 1025 ml  Net 539.53 ml       Physical Exam  CVP 3  General: NAD Neck: No JVD, no thyromegaly or thyroid nodule.  Lungs: Clear to auscultation bilaterally with normal respiratory effort. CV: Regular, Impella sounds.  No peripheral edema.    Abdomen: Soft, nontender, no hepatosplenomegaly, no distention.  Skin: Intact without lesions or rashes.  Neurologic: Alert and oriented x 3.  Psych: Normal affect. Extremities: No clubbing or cyanosis. Left groin hematoma stable, Impella in right groin.  HEENT: Normal.    Telemetry   NSR 80s personally reviewed.   EKG    N/A   Labs    CBC Recent Labs    01/02/21 1503 01/03/21 0434  WBC 8.2 8.4  HGB 9.8* 9.8*  HCT 30.1* 29.6*  MCV 93.5 93.1  PLT 38* 35*   Basic Metabolic Panel Recent Labs    35/57/32 0342 01/02/21 1503 01/03/21 0434  NA 136 136 136  K 3.8 3.6 3.9  CL 110 112* 110  CO2 20* 21* 21*  GLUCOSE 125* 89 99  BUN 22 19 16   CREATININE 0.68 0.62 0.61  CALCIUM 7.7* 7.6* 7.7*  MG 2.0  --  2.0   Liver Function Tests Recent Labs    01/01/21 0459 01/02/21 0342  AST 668* 394*  ALT 725* 579*  ALKPHOS 143* 113  BILITOT 1.8* 1.3*  PROT 4.7* 4.8*  ALBUMIN 2.5* 2.4*   No results for input(s): LIPASE, AMYLASE in the  last 72 hours. Cardiac Enzymes No results for input(s): CKTOTAL, CKMB, CKMBINDEX, TROPONINI in the last 72 hours.  BNP: BNP (last 3 results) Recent Labs    12/29/20 0014  BNP >4,500.0*    ProBNP (last 3 results) No results for input(s): PROBNP in the last 8760 hours.   D-Dimer No results for input(s): DDIMER in the last 72 hours. Hemoglobin A1C No results for input(s): HGBA1C in the last 72 hours. Fasting Lipid Panel No results for input(s): CHOL, HDL, LDLCALC, TRIG, CHOLHDL, LDLDIRECT in the last 72 hours. Thyroid Function Tests No results for input(s): TSH, T4TOTAL, T3FREE, THYROIDAB in the last 72 hours.  Invalid input(s): FREET3   Other results:   Imaging    No results found.   Medications:      Scheduled Medications:  aspirin EC  81 mg Oral Daily   atorvastatin  80 mg Oral Daily   chlorhexidine  15 mL Mouth Rinse BID   Chlorhexidine Gluconate Cloth  6 each Topical Daily   digoxin  0.125 mg Oral Daily   docusate sodium  200 mg Oral Daily   ezetimibe  10 mg Oral Daily   feeding supplement  237 mL Oral BID BM   levothyroxine  100 mcg Oral Q0600   losartan  12.5 mg Oral BID   mouth rinse  15 mL Mouth Rinse q12n4p   polyethylene glycol  17 g Oral Daily   potassium chloride  20 mEq Oral Once   senna  2 tablet Oral Daily   sodium chloride flush  10-40 mL Intracatheter Q12H   sodium chloride flush  3 mL Intravenous Q12H   sodium chloride flush  3 mL Intravenous Q12H   spironolactone  12.5 mg Oral Daily   thiamine  100 mg Oral Daily   ticagrelor  90 mg Oral BID    Infusions:  sodium chloride 20 mL/hr at 01/03/21 0700   sodium chloride     sodium chloride 10 mL/hr at 01/03/21 0443   sodium chloride     bivalirudin (ANGIOMAX) infusion 0.5 mg/mL (Non-ACS indications) 0.025 mg/kg/hr (01/03/21 0700)   dextrose 5 % (Impella PURGE) solution 1000 mL bag     milrinone 0.375 mcg/kg/min (01/03/21 0733)   norepinephrine (LEVOPHED) Adult infusion 1 mcg/min (01/03/21 0700)    PRN Medications: sodium chloride, Place/Maintain arterial line **AND** sodium chloride, acetaminophen, fentaNYL (SUBLIMAZE) injection, lip balm, ondansetron (ZOFRAN) IV, oxyCODONE-acetaminophen, sodium chloride, sodium chloride flush, sodium chloride flush    Patient Profile   Jane Navarro is a 75 year old with history of htn, hyperlipidemia, CVA, hypothyroidism, and CKD Stage IIIa.   Admitted with chest pain--> NSTEMI -->cardiogenic shock    Assessment/Plan  NSTEMI--->CAD severe multivessel diseease-->Cardiogenic Shock - HS Trop 6222->9798. ECHO today EF 25-30% and severely reduced RV function.  - LHC with severe L main with multivessel  proximal LAD and mild RCA .  - In the cath lab PA sat < 40%., CI  1.6.  Placed Impella 2.5 placed and started on milrinone  - Felt too high risk for CABG.  - 9/30 PCI to RCA with DES x 3, deferred intervention on left main with difficulty wiring the LAD and non-flow limiting dissection.  Will plan PCI several weeks down the road to allow healing.   - Continue ASA 81, ticagrelor, atorvastatin.   2.  Cardiogenic shock: Ischemic cardiomyopathy.  Echo with EF 25-30%, severely decreased RV function, moderate-severe TR, moderate MR. This morning with CVP 3, CI improved at 2.5, co-ox 70%.  -  She remains on milrinone 0.375, now off NE.   - Impella at P4.  Good position by echo Friday night.  On bivalirudin with concern for HIT.  I will decrease to P3 today.  I think Impella can be removed soon, low platelets concern me for the removal (would like to avoid platelet transfusion with fresh stents).  Platelet count appears to have hit nadir, hopefully will start to trend up and would aim to try to get Impella out tomorrow. LDH with significant fall to 492, do not think we are hemolyzing from Impella at this point.  - Continue digoxin 0.125 mcg  - Continue 12.5 mg losartan bid and 12.5 mg spironolactone.  - Renal function stable.  - No diuretic with low CVP, will give NS 75 cc/hr x 6 hrs again this morning.   2. Shock Liver  - LFTs now trending down.    3. Anemia  - Hgb dropped to 6.5 initially>> given 2UPRBCS with appropriate rise to 8.9.  - 1U RBCs given 9/29, Hgb 7.6>>9.1 - Significant hematoma left groin post-intervention, had 2 units PRBCs Friday night with appropriate increase in hgb to 11 today.  Site stable, back on bivalirudin and femstop off.  - Hgb currently 9.8.    4. Hypothyroidism  -TSH 4.8  -Continue levothyroxine.    5. UTI  - Urine culture NGTD - Completed course of rocephin    6. H/O CVA  7. Renal: Creatinine stable.   8. 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  LDH is now trending down significantly  which is a good sign re: thrombolysis and urine is clear. Platelets appear to have hopefully hit a nadir at 35K.  Preliminary HIT negative, SRA pending.   - Continue to the bivalirudin for now.  - Decrease Impella to P3 and as above, hope to remove tomorrow. Would like to see plts trending up.   CRITICAL CARE Performed by: Marca Ancona  Total critical care time: 40 minutes  Critical care time was exclusive of separately billable procedures and treating other patients.  Critical care was necessary to treat or prevent imminent or life-threatening deterioration.  Critical care was time spent personally by me on the following activities: development of treatment plan with patient and/or surrogate as well as nursing, discussions with consultants, evaluation of patient's response to treatment, examination of patient, obtaining history from patient or surrogate, ordering and performing treatments and interventions, ordering and review of laboratory studies, ordering and review of radiographic studies, pulse oximetry and re-evaluation of patient's condition.  Marca Ancona 01/03/2021 7:52 AM

## 2021-01-03 NOTE — Progress Notes (Signed)
ANTICOAGULATION CONSULT NOTE  Pharmacy Consult for Heparin > Bivalirudin Indication:  Impella  CP  Allergies  Allergen Reactions   Ciprofloxacin    Erythromycin    Penicillins    Ampicillin Rash    Mostly topical per daughter    Patient Measurements: Height: 5' (152.4 cm) Weight: 61.2 kg (134 lb 14.7 oz) IBW/kg (Calculated) : 45.5 Heparin Dosing Weight: 58 kg  Vital Signs: Temp: 98.4 F (36.9 C) (10/02 0700) Temp Source: Core (10/02 0400) BP: 116/77 (10/02 0700) Pulse Rate: 86 (10/02 0700)  Labs: Recent Labs     0000 12/31/20 1104 12/31/20 2355 01/01/21 0459 01/01/21 1622 01/01/21 1954 01/02/21 0031 01/02/21 0342 01/02/21 0900 01/02/21 1503 01/02/21 1705 01/03/21 0434  HGB  --   --   --  9.1*   < > 11.4*  --  11.0*  --  9.8*  --  9.8*  HCT  --   --   --  28.2*   < > 34.0*  --  32.8*  --  30.1*  --  29.6*  PLT  --   --   --  74*   < > 54*  --  49*  --  38*  --  35*  APTT  --   --   --   --   --  >200*   < > 51* 51*  --  51* 53*  LABPROT  --   --   --   --   --  25.4*  --   --   --   --   --   --   INR  --   --   --   --   --  2.3*  --   --   --   --   --   --   HEPARINUNFRC  --  0.13* 0.17* 0.24*  --   --   --   --   --   --   --   --   CREATININE   < >  --   --  0.81  --   --   --  0.68  --  0.62  --  0.61   < > = values in this interval not displayed.     Estimated Creatinine Clearance: 50.5 mL/min (by C-G formula based on SCr of 0.61 mg/dL).   Assessment: 31 yoF admitted w/ CP s/p LHC with Impella CP placement. Pharmacy consulted for systemic anticoagulation management.  Heparin initially in impella purge solution and low dose systemic heparin.  Patient with platelet drop 200>54 with increasing LDH 1400 - with impella CP. Concern for HIT. Antibody and SRA ordered and heparin changed to bivalirudan for systemic anticoagulation and D5W purge.  S/p PCI 9/30 afternoon with bivalirudin given in cath lab. aPTT >200 and pt with large amount of bleeding from L  groin site post cath. Repeat aPTT down to 60 sec.  Now with aPTT at 53 and therapeutic for 3 consecutive readings. At lower end of therapeutic range, however given recent bleeding and that it is for the Impella pump will leave rate as is.   CBC stable with hemoglobin at 9.8, but still thrombocytopenic with platelets at 35. LDH down trending from 734 to 492. Impella at P4 providing 2.2 L/min of support and patient maintaining CI at 2.5.  Per RN, no further bleeding.   Goal of Therapy:  Aptt 50-70sec Monitor platelets by anticoagulation protocol: Yes   Plan:  Continue D5W purge solution Continue  bivalrudin at 0.025 mg/kg/hr (3.2 ml/hr) Check aPTT q12h given Impella and high bleed risk Daily CBC  Drake Leach, PharmD PGY2 Cardiology Pharmacy Resident Phone: [phone] 01/03/2021  7:12 AM  Please check [email or web address] for unit-specific pharmacy phone numbers.

## 2021-01-04 ENCOUNTER — Encounter (HOSPITAL_COMMUNITY): Payer: Self-pay | Admitting: Internal Medicine

## 2021-01-04 DIAGNOSIS — I214 Non-ST elevation (NSTEMI) myocardial infarction: Secondary | ICD-10-CM | POA: Diagnosis not present

## 2021-01-04 DIAGNOSIS — R570 Cardiogenic shock: Secondary | ICD-10-CM | POA: Diagnosis not present

## 2021-01-04 LAB — CBC
HCT: 28.2 % — ABNORMAL LOW (ref 36.0–46.0)
Hemoglobin: 9.3 g/dL — ABNORMAL LOW (ref 12.0–15.0)
MCH: 30.9 pg (ref 26.0–34.0)
MCHC: 33 g/dL (ref 30.0–36.0)
MCV: 93.7 fL (ref 80.0–100.0)
Platelets: 34 10*3/uL — ABNORMAL LOW (ref 150–400)
RBC: 3.01 MIL/uL — ABNORMAL LOW (ref 3.87–5.11)
RDW: 16.3 % — ABNORMAL HIGH (ref 11.5–15.5)
WBC: 8.5 10*3/uL (ref 4.0–10.5)
nRBC: 0 % (ref 0.0–0.2)

## 2021-01-04 LAB — COMPREHENSIVE METABOLIC PANEL
ALT: 241 U/L — ABNORMAL HIGH (ref 0–44)
AST: 95 U/L — ABNORMAL HIGH (ref 15–41)
Albumin: 1.9 g/dL — ABNORMAL LOW (ref 3.5–5.0)
Alkaline Phosphatase: 83 U/L (ref 38–126)
Anion gap: 5 (ref 5–15)
BUN: 15 mg/dL (ref 8–23)
CO2: 20 mmol/L — ABNORMAL LOW (ref 22–32)
Calcium: 7.8 mg/dL — ABNORMAL LOW (ref 8.9–10.3)
Chloride: 110 mmol/L (ref 98–111)
Creatinine, Ser: 0.61 mg/dL (ref 0.44–1.00)
GFR, Estimated: >60 mL/min (ref >60)
Glucose, Bld: 100 mg/dL — ABNORMAL HIGH (ref 70–99)
Potassium: 4 mmol/L (ref 3.5–5.1)
Sodium: 135 mmol/L (ref 135–145)
Total Bilirubin: 1.1 mg/dL (ref 0.3–1.2)
Total Protein: 4.3 g/dL — ABNORMAL LOW (ref 6.5–8.1)

## 2021-01-04 LAB — MAGNESIUM: Magnesium: 2 mg/dL (ref 1.7–2.4)

## 2021-01-04 LAB — COOXEMETRY PANEL
Carboxyhemoglobin: 2 % — ABNORMAL HIGH (ref 0.5–1.5)
Methemoglobin: 0.7 % (ref 0.0–1.5)
O2 Saturation: 68.9 %
Total hemoglobin: 9.5 g/dL — ABNORMAL LOW (ref 12.0–16.0)

## 2021-01-04 LAB — HAPTOGLOBIN: Haptoglobin: <10 mg/dL — ABNORMAL LOW (ref 42–346)

## 2021-01-04 LAB — APTT: aPTT: 55 seconds — ABNORMAL HIGH (ref 24–36)

## 2021-01-04 MED ORDER — SODIUM CHLORIDE 0.9% IV SOLUTION: Freq: Once | INTRAVENOUS | Status: AC

## 2021-01-04 MED ORDER — SORBITOL 70 % SOLN
60.0000 mL | Freq: Once | Status: DC
  Filled 2021-01-04: qty 60

## 2021-01-04 MED ORDER — FENTANYL CITRATE PF 50 MCG/ML IJ SOSY
50.0000 ug | PREFILLED_SYRINGE | Freq: Once | INTRAMUSCULAR | Status: AC
  Administered 2021-01-04: 50 ug via INTRAVENOUS

## 2021-01-04 MED FILL — Verapamil HCl IV Soln 2.5 MG/ML: INTRAVENOUS | Qty: 2 | Status: AC

## 2021-01-04 MED FILL — Nitroglycerin IV Soln 100 MCG/ML in D5W: INTRA_ARTERIAL | Qty: 10 | Status: AC

## 2021-01-04 NOTE — Progress Notes (Signed)
ANTICOAGULATION CONSULT NOTE  Pharmacy Consult for Heparin > Bivalirudin Indication:  Impella  CP  Allergies  Allergen Reactions   Ciprofloxacin    Erythromycin    Penicillins    Ampicillin Rash    Mostly topical per daughter    Patient Measurements: Height: 5' (152.4 cm) Weight: 61.2 kg (134 lb 14.7 oz) IBW/kg (Calculated) : 45.5 Heparin Dosing Weight: 58 kg  Vital Signs: Temp: 98.6 F (37 C) (10/03 0500) Temp Source: Core (10/03 0400) BP: 107/63 (10/03 0500) Pulse Rate: 79 (10/03 0500)  Labs: Recent Labs    01/01/21 1954 01/02/21 0031 01/02/21 1503 01/02/21 1705 01/03/21 0434 01/03/21 1652 01/04/21 0442  HGB 11.4*   < > 9.8*  --  9.8* 9.3* 9.3*  HCT 34.0*   < > 30.1*  --  29.6* 28.9* 28.2*  PLT 54*   < > 38*  --  35* 33* 34*  APTT >200*   < >  --    < > 53* 48* 55*  LABPROT 25.4*  --   --   --   --   --   --   INR 2.3*  --   --   --   --   --   --   CREATININE  --    < > 0.62  --  0.61 0.66  --    < > = values in this interval not displayed.     Estimated Creatinine Clearance: 50.5 mL/min (by C-G formula based on SCr of 0.66 mg/dL).   Assessment: 59 yoF admitted w/ CP s/p LHC with Impella CP placement. Pharmacy consulted for systemic anticoagulation management.  Heparin initially in impella purge solution and low dose systemic heparin.  Patient with platelet drop 200>54 with increasing LDH 1400 - with impella CP. Concern for HIT Antibody 9/30 resulted negative and SRA remains in process. Heparin changed to bivalirudan for systemic anticoagulation and D5W purge.  S/p PCI 9/30 afternoon with bivalirudin given in cath lab. Now with aPTT therapeutic at 55. Patient has had several minor bleeding episodes this admission. Platelets are low at 34K and H/H are low stable. Since HIT ab negative, will monitor plans to switch back to heparin based on the SRA results.   Goal of Therapy:  Aptt 50-70sec Monitor platelets by anticoagulation protocol: Yes   Plan:   Continue D5W purge solution Continue bivalrudin at 0.025 mg/kg/hr (3.2 ml/hr) Check aPTT q12h given Impella and high bleed risk Daily CBC  Thank you for allowing pharmacy to participate in this patient's care.  Enos Fling, PharmD PGY1 Pharmacy Resident 01/04/2021 5:31 AM Check [email or web address] for unit specific pharmacy number

## 2021-01-04 NOTE — CV Procedure (Signed)
  Procedure: Impella Removal  Operator: Caylie Sandquist  Patient off bivalirudin x 2-3 hours. Tolerating P-3.   Patient premedicated with Fentanyl 50ug IV  RFA site dressing removed. Site cleaned and sutures removed.   Device turned to P-1 and pulled across the aortic valve. Impella power turned off. Impella device then pulled down to the sheath and device/sheath pulled out without problem.   I personally held manual pressure x 40 minutes. Good hemostasis. Pressure dressing and fem stop placed. DP pulse in right foot with loud Doppler signal.   Arvilla Meres, MD  4:53 PM

## 2021-01-04 NOTE — Progress Notes (Addendum)
NAME:  Jane Navarro, MRN:  [id number], DOB:  09-29-1945, LOS: 6 ADMISSION DATE:  12/28/2020, CONSULTATION DATE:  01/01/2021 REFERRING MD:  Shirlee Latch, CHIEF COMPLAINT:  hemorrhagic shock   History of Present Illness:  75 year old woman who developed hypotension felt to be due to bleeding at PCI groin access site.   She originally presented on 9/03 with AMS due to severe hypothyroidism.  She presented again on 9/26 with complaints of retrosternal chest pain and she ruled in for a non-STEMI.  Found to have an ischemic cardiomyopathy with an EF of 25-30% and grade 2 diastolic dysfunction.  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  A CP Impella was placed and she was started on milrinone with improvement in signs of congestion.  Deemed to be poor candidate for surgery, so underwent staged multivessel PCI.  Pertinent  Medical History   Past Medical History:  Diagnosis Date   Anemia    CVA (cerebral vascular accident) (HCC)    HLD (hyperlipidemia)    Hypothyroidism    Tobacco abuse, in remission      Significant Hospital Events: Including procedures, antibiotic start and stop dates in addition to other pertinent events   9/30 -successful deployment of overlapping RCA stents.  Received intraprocedural bivalirudin. 9/30 subsequent hypotension in ICU with evidence of bleeding at the site  Interim History / Subjective:  No further bleeding at site.  Denies chest pain or difficulty breathing. Appears tired this morning, but responded in full sentences. Daughter [name] at bedside.  Objective   Blood pressure 91/62, pulse 89, temperature 98.6 F (37 C), resp. rate 18, height 5' (1.524 m), weight 61.2 kg, SpO2 96 %. PAP: (16-28)/(9-18) 18/14 CVP:  [0 mmHg-3 mmHg] 3 mmHg PCWP:  [8 mmHg] 8 mmHg CO:  [3.8 L/min-4.6 L/min] 4.3 L/min CI:  [2.4 L/min/m2-2.9 L/min/m2] 2.7 L/min/m2      Intake/Output Summary (Last 24 hours) at 01/04/2021 0810 Last data filed at  01/04/2021 0800 Gross per 24 hour  Intake 1460.44 ml  Output 745 ml  Net 715.44 ml    Filed Weights   01/01/21 0420 01/02/21 0500 01/03/21 0530  Weight: 64.1 kg 59.9 kg 61.2 kg    Examination: General: Elderly frail appearing woman HENT: Dry mucous membranes Lungs: Chest clear to auscultation, normal work of breathing Cardiovascular: Heart sounds distant but normal. Impella site intact and hum appreciated on auscultation. Extremities warm. Abdomen: Abdomen is soft, non-distended Extremities: Right groin introducer with Swan.  No further left groin bleeding.  Bilateral lower extremity edema Neuro: Sensorium intact.  No focal neurological deficits. GU: Clear urine  Resolved Hospital Problem list   None.  Assessment & Plan:  Critically ill due to hemorrhagic shock requiring titration of vasopressors and administration of blood products. Critically ill due to decompensated diastolic heart failure and cardiogenic shock requiring titration of milrinone and Impella support due to ischemic cardiomyopathy Coronary artery disease status post PCI RCA. Thrombocytopenia Severe hypothyroidism-now treated AKI has resolved Resolving shock liver Acute on chronic anemia normocytic normochromic anemia  Presence of CP Impella with hemolysis and platelet consumption. Also received Ticagrelor and heparin, which was switched to bivalirudin after concern for potential IT. Thrombocytopenia could also be 2/2 shock liver. Patient's cardiogenic shock from NSTEMI leading to decompensated heart failure is improving and plan for Impella removal today   Plan: - Followed by cardiology and advanced heart failure. - Now of norepinephrine with stable blood pressure and continues on milrinone for improving  cardiogenic shock. - Plan for Impella removal after platelet transfusion today (10/3).  - Respiratory status is stable and patient is not requiring any advanced respiratory support.   Best Practice (right  click and "Reselect all SmartList Selections" daily)   Diet/type: Cardiac diet DVT prophylaxis: On hold due to thrombocytopenia GI prophylaxis: N/A Lines: Central line Foley:  Yes, and it is still needed Code Status:  full code Last date of multidisciplinary goals of care discussion [updated daughter at the bedside]  Labs   CBC: Recent Labs  Lab 12/29/20 0714 12/29/20 1038 01/02/21 0342 01/02/21 1503 01/03/21 0434 01/03/21 1652 01/04/21 0442  WBC 12.0*   < > 7.8 8.2 8.4 7.6 8.5  NEUTROABS 10.0*  --   --   --   --   --   --   HGB 7.4*   < > 11.0* 9.8* 9.8* 9.3* 9.3*  HCT 23.8*   < > 32.8* 30.1* 29.6* 28.9* 28.2*  MCV 102.1*   < > 91.9 93.5 93.1 96.3 93.7  PLT 270   < > 49* 38* 35* 33* 34*   < > = values in this interval not displayed.     Basic Metabolic Panel: Recent Labs  Lab 12/31/20 0436 01/01/21 0459 01/02/21 0342 01/02/21 1503 01/03/21 0434 01/03/21 1652 01/04/21 0442  NA 140 138 136 136 136 136 135  K 4.4 3.9 3.8 3.6 3.9 4.0 4.0  CL 111 109 110 112* 110 111 110  CO2 25 24 20* 21* 21* 22 20*  GLUCOSE 103* 114* 125* 89 99 99 100*  BUN 41* 30* 22 19 16 16 15   CREATININE 0.95 0.81 0.68 0.62 0.61 0.66 0.61  CALCIUM 8.1* 8.1* 7.7* 7.6* 7.7* 7.7* 7.8*  MG 2.4 2.2 2.0  --  2.0  --  2.0    GFR: Estimated Creatinine Clearance: 50.5 mL/min (by C-G formula based on SCr of 0.61 mg/dL). Recent Labs  Lab 12/29/20 0714 12/29/20 1510 12/29/20 1520 12/29/20 2116 12/30/20 0423 12/31/20 0436 01/01/21 0459 01/02/21 1503 01/03/21 0434 01/03/21 1652 01/04/21 0442  PROCALCITON 4.12  --   --   --  3.88 2.51  --   --   --   --   --   WBC 12.0*  --    < >  --  9.0 7.7   < > 8.2 8.4 7.6 8.5  LATICACIDVEN  --  2.5*  --  1.8  --   --   --   --   --   --   --    < > = values in this interval not displayed.     Liver Function Tests: Recent Labs  Lab 12/30/20 0423 12/31/20 0631 01/01/21 0459 01/02/21 0342 01/04/21 0442  AST 1,275* 604* 668* 394* 95*  ALT 958*  695* 725* 579* 241*  ALKPHOS 199* 154* 143* 113 83  BILITOT 2.6* 1.1 1.8* 1.3* 1.1  PROT 5.3* 4.9* 4.7* 4.8* 4.3*  ALBUMIN 2.9* 2.4* 2.5* 2.4* 1.9*    No results for input(s): LIPASE, AMYLASE in the last 168 hours. Recent Labs  Lab 12/29/20 0014  AMMONIA 12     ABG    Component Value Date/Time   PHART 7.409 12/29/2020 1227   PCO2ART 29.4 (L) 12/29/2020 1227   PO2ART 76 (L) 12/29/2020 1227   HCO3 18.6 (L) 12/29/2020 1227   TCO2 19 (L) 12/29/2020 1227   ACIDBASEDEF 6.0 (H) 12/29/2020 1227   O2SAT 68.9 01/04/2021 0442      Coagulation Profile: Recent  Labs  Lab 01/01/21 1954  INR 2.3*     Cardiac Enzymes: No results for input(s): CKTOTAL, CKMB, CKMBINDEX, TROPONINI in the last 168 hours.  HbA1C: Hgb A1c MFr Bld  Date/Time Value Ref Range Status  12/05/2020 07:56 PM 5.5 4.8 - 5.6 % Final    Comment:    (NOTE)         Prediabetes: 5.7 - 6.4         Diabetes: >6.4         Glycemic control for adults with diabetes: <7.0     CBG: Recent Labs  Lab 01/02/21 1136 01/02/21 1637  GLUCAP 115* 82    Rosie Fate, MS4    PCCM attending:  75 yo FM, NSTEMI cardiogenic shock, impella, PCI to RCA DES X3, deferred intervention on the left side. Complicated by bleeding issues post op in groin. Now better. Weaning from milrinone and planned to pull impella today. Respriatory status is stable  BP 103/62   Pulse 81   Temp 98.8 F (37.1 C) (Core)   Resp 20   Ht 5' (1.524 m)   Wt 61.2 kg   SpO2 97%   BMI 26.35 kg/m   Gen: elderly FM, on RA, alert  Heart: RRR s1 s2 Neck: right IJ swan  Lungs: CTAB Ext: warm, no edema   Labs reviewed  Hgb stable Plt low, stable  Tbili stable   A:  NSTEMI Cardiogenic shock  Impella support Shock liver - all indices are improving Thrombocytopenia  UTI - completed Abx   P: Plans to pull the impella today  Discussed with cardiology HF service who will assume primary Wean SPO2 as tolerated to maintain sats >90%   Follow platelets Follow up PCI per cardiology   PCCM will sign off at this time. Please call if needed. Happy to help any way I can.   Josephine Igo, DO [facility] 01/04/2021 10:58 AM

## 2021-01-04 NOTE — Progress Notes (Addendum)
Patient ID: Jane Navarro, female   DOB: 03-01-1946, 75 y.o.   MRN: [id number]     Advanced Heart Failure Rounding Note  PCP-Cardiologist: Parke Poisson, MD   Subjective:    - 9/27: NSTEMI-->Cardiogenic shock. Impella CP placed and started on milrinone. Given 2 UPRBCs. Milrinone increased to 0.375 mcg.  - 9/28: Continued on milrinone 0.375 mcg + impella P8. Started on losartan and spiro.  - 9/30:  PCI to RCA overlapping DES x 3, deferred intervention on left system.  Groin site bleeding, femstop applied and 2 units PRBCs given.  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   Groin stable. No further bleeding. Hgb stable at 9.3. Platelets 34K today, rate of drop seems to have nadired.  Preliminary HIT negative, pending SRA. On bivalirudin. Urine clear.   Remains on Impella CP P-3. Flow 1.9. Good waveforms. On milrinone 0.375 mcg/kg/min.    Swan# s CVP 2 PA 18/12 (13)  CI 2.47 CO 3.97 Co-ox 69%  LDH 1,119>>1,435>>734>>492  LFTs improving  AST 604>>668>>394>>95 ALT 695>>725>>579>>241  Scr stable 0.81>>0.68>>0.61>0.61  Feels ok this morning. Denies CP. No dyspnea. No BM since 9/26. +Flatus.    Objective:   Weight Range: 61.2 kg Body mass index is 26.35 kg/m.   Vital Signs:   Temp:  [98.2 F (36.8 C)-99 F (37.2 C)] 98.6 F (37 C) (10/03 0600) Pulse Rate:  [76-86] 83 (10/03 0600) Resp:  [18-23] 18 (10/03 0600) BP: (87-108)/(58-77) 108/70 (10/03 0600) SpO2:  [95 %-100 %] 97 % (10/03 0600) Arterial Line BP: (93-127)/(53-73) 125/55 (10/03 0600) Last BM Date: 12/28/20  Weight change: Filed Weights   01/01/21 0420 01/02/21 0500 01/03/21 0530  Weight: 64.1 kg 59.9 kg 61.2 kg    Intake/Output:   Intake/Output Summary (Last 24 hours) at 01/04/2021 0712 Last data filed at 01/04/2021 0700 Gross per 24 hour  Intake 1485.15 ml  Output 785 ml  Net 700.15 ml      Physical Exam   CVP 2  General:  elderly WF. No distress. No respiratory  difficulty HEENT: normal, dentition in poor repair  Neck: supple. no JVD + Rt IJ Swan cath. Carotids 2+ bilat; no bruits. No lymphadenopathy or thyromegaly appreciated. Cor: PMI nondisplaced. Regular rate & rhythm. No rubs, gallops or murmurs. Lungs: clear Abdomen: soft, nontender, mildly distended. No hepatosplenomegaly. No bruits or masses. Hypoactive BS Extremities: no cyanosis, clubbing, rash, trace-1+ pretibial edema, + Rt femoral impella site stable, no further bleeding, Left groin soft Neuro: alert & oriented x 3, cranial nerves grossly intact. moves all 4 extremities w/o difficulty. Affect pleasant.   Telemetry   NSR 80s personally reviewed.   EKG    N/A   Labs    CBC Recent Labs    01/03/21 1652 01/04/21 0442  WBC 7.6 8.5  HGB 9.3* 9.3*  HCT 28.9* 28.2*  MCV 96.3 93.7  PLT 33* 34*   Basic Metabolic Panel Recent Labs    08/65/78 0434 01/03/21 1652 01/04/21 0442  NA 136 136 135  K 3.9 4.0 4.0  CL 110 111 110  CO2 21* 22 20*  GLUCOSE 99 99 100*  BUN 16 16 15   CREATININE 0.61 0.66 0.61  CALCIUM 7.7* 7.7* 7.8*  MG 2.0  --  2.0   Liver Function Tests Recent Labs    01/02/21 0342 01/04/21 0442  AST 394* 95*  ALT 579* 241*  ALKPHOS 113 83  BILITOT 1.3* 1.1  PROT 4.8* 4.3*  ALBUMIN 2.4* 1.9*   No  results for input(s): LIPASE, AMYLASE in the last 72 hours. Cardiac Enzymes No results for input(s): CKTOTAL, CKMB, CKMBINDEX, TROPONINI in the last 72 hours.  BNP: BNP (last 3 results) Recent Labs    12/29/20 0014  BNP >4,500.0*    ProBNP (last 3 results) No results for input(s): PROBNP in the last 8760 hours.   D-Dimer No results for input(s): DDIMER in the last 72 hours. Hemoglobin A1C No results for input(s): HGBA1C in the last 72 hours. Fasting Lipid Panel No results for input(s): CHOL, HDL, LDLCALC, TRIG, CHOLHDL, LDLDIRECT in the last 72 hours. Thyroid Function Tests No results for input(s): TSH, T4TOTAL, T3FREE, THYROIDAB in the last  72 hours.  Invalid input(s): FREET3   Other results:   Imaging    No results found.   Medications:     Scheduled Medications:  aspirin EC  81 mg Oral Daily   atorvastatin  80 mg Oral Daily   chlorhexidine  15 mL Mouth Rinse BID   Chlorhexidine Gluconate Cloth  6 each Topical Daily   digoxin  0.125 mg Oral Daily   docusate sodium  200 mg Oral Daily   ezetimibe  10 mg Oral Daily   feeding supplement  237 mL Oral BID BM   levothyroxine  100 mcg Oral Q0600   losartan  12.5 mg Oral BID   mouth rinse  15 mL Mouth Rinse q12n4p   polyethylene glycol  17 g Oral Daily   senna  2 tablet Oral Daily   sodium chloride flush  10-40 mL Intracatheter Q12H   sodium chloride flush  3 mL Intravenous Q12H   spironolactone  12.5 mg Oral Daily   thiamine  100 mg Oral Daily   ticagrelor  90 mg Oral BID    Infusions:  sodium chloride 20 mL/hr at 01/04/21 0500   sodium chloride 10 mL/hr at 01/03/21 0443   bivalirudin (ANGIOMAX) infusion 0.5 mg/mL (Non-ACS indications) 0.025 mg/kg/hr (01/04/21 0500)   dextrose 5 % (Impella PURGE) solution 1000 mL bag     milrinone 0.375 mcg/kg/min (01/04/21 0500)   norepinephrine (LEVOPHED) Adult infusion Stopped (01/03/21 1426)    PRN Medications: Place/Maintain arterial line **AND** sodium chloride, acetaminophen, fentaNYL (SUBLIMAZE) injection, lip balm, ondansetron (ZOFRAN) IV, oxyCODONE-acetaminophen, sodium chloride, sodium chloride flush    Patient Profile   Jane Navarro is a 75 year old with history of htn, hyperlipidemia, CVA, hypothyroidism, and CKD Stage IIIa.   Admitted with chest pain--> NSTEMI -->cardiogenic shock    Assessment/Plan  NSTEMI--->CAD severe multivessel diseease-->Cardiogenic Shock - HS Trop 9833->8250. ECHO EF 25-30% and severely reduced RV function.  - LHC with severe L main with multivessel  proximal LAD and mild RCA .  - In the cath lab PA sat < 40%., CI 1.6.  Placed Impella 2.5 placed and started on milrinone  - Felt  too high risk for CABG.  - 9/30 PCI to RCA with DES x 3, deferred intervention on left main with difficulty wiring the LAD and non-flow limiting dissection.  Will plan PCI several weeks down the road to allow healing.   - Continue ASA 81, ticagrelor, atorvastatin.   2.  Cardiogenic shock: Ischemic cardiomyopathy.  Echo with EF 25-30%, severely decreased RV function, moderate-severe TR, moderate MR. This morning with CVP 2, CI improved at 2.5, co-ox 69%.  - She remains on milrinone 0.375, now off NE.   - Impella at P3.  Good position by echo Friday night.  On bivalirudin with concern for HIT. Think Impella  can be removed soon, low platelets concern me for the removal (would like to avoid platelet transfusion with fresh stents).  Platelet count appears to have hit nadir, hopefully will start to trend up. LDH with significant fall to 492, do not think we are hemolyzing from Impella at this point.  - Continue digoxin 0.125 mcg  - Continue 12.5 mg losartan bid and 12.5 mg spironolactone.  - Renal function stable.  - No diuretic with low CVP.  2. Shock Liver  - LFTs now trending down.    3. Anemia  - Hgb dropped to 6.5 initially>> given 2UPRBCS with appropriate rise to 8.9.  - 1U RBCs given 9/29, Hgb 7.6>>9.1 - Significant hematoma left groin post-intervention, had 2 units PRBCs 9/30 with appropriate increase in hgb. Site stable, back on bivalirudin and femstop off.  - Hgb currently 9.3, stable.    4. Hypothyroidism  -TSH 4.8  -Continue levothyroxine.    5. UTI  - Urine culture NGTD - Completed course of rocephin    6. H/O CVA  7. AKI:  - resolved - SCr 0.61   8. 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  LDH is now trending down significantly which is a good sign re: thrombolysis and urine is clear. Platelets appear to have hopefully hit a nadir at 34K.  Preliminary HIT negative, SRA pending.   - Continue to the bivalirudin for now.  - Continue  Impella at P3 and as above, hope to remove soon. Would like to see plts trending up.   9. Constipation  - c/w bowel regimen - add sorbitol    Robbie Lis, PA-C  01/04/2021 7:12 AM  Agree with above.   Hemodynamically improved on milrinone 0.375 and Impella @ P-3. Waveforms ok. Swan #s look good.   No CP. Platelets remain low. (HIT AB negative) Groin stable now. Hgb ok  General:  Lying in bed.  HEENT: normal Neck: supple. RIJ swan Carotids 2+ bilat; no bruits. No lymphadenopathy or thryomegaly appreciated. Cor: PMI nondisplaced. Regular rate & rhythm. No rubs, gallops or murmurs. Lungs: clear Abdomen: soft, nontender, nondistended. No hepatosplenomegaly. No bruits or masses. Good bowel sounds. Extremities: no cyanosis, clubbing, rash, edema. RFA Impella Neuro: alert & orientedx3, cranial nerves grossly intact. moves all 4 extremities w/o difficulty. Affect pleasant  Will give a pack of platelets this am. Hold Bival. Plan to pull Impella. Leave swan in for pull. Continue milrinone.   CRITICAL CARE Performed by: Arvilla Meres  Total critical care time: 45 minutes  Critical care time was exclusive of separately billable procedures and treating other patients.  Critical care was necessary to treat or prevent imminent or life-threatening deterioration.  Critical care was time spent personally by me (independent of midlevel providers or residents) on the following activities: development of treatment plan with patient and/or surrogate as well as nursing, discussions with consultants, evaluation of patient's response to treatment, examination of patient, obtaining history from patient or surrogate, ordering and performing treatments and interventions, ordering and review of laboratory studies, ordering and review of radiographic studies, pulse oximetry and re-evaluation of patient's condition.  Arvilla Meres, MD  8:43 AM  .

## 2021-01-04 NOTE — Progress Notes (Signed)
CI post Impella removal stable at 2.6.  Fem Stop remains in placed. Mild groin pain, improved w/ percocet. Otherwise feels ok. Daughter present at bedside.   Trashawn Oquendo,PA-C

## 2021-01-05 DIAGNOSIS — I214 Non-ST elevation (NSTEMI) myocardial infarction: Secondary | ICD-10-CM | POA: Diagnosis not present

## 2021-01-05 LAB — COMPREHENSIVE METABOLIC PANEL
ALT: 171 U/L — ABNORMAL HIGH (ref 0–44)
AST: 56 U/L — ABNORMAL HIGH (ref 15–41)
Albumin: 1.9 g/dL — ABNORMAL LOW (ref 3.5–5.0)
Alkaline Phosphatase: 83 U/L (ref 38–126)
Anion gap: 5 (ref 5–15)
BUN: 11 mg/dL (ref 8–23)
CO2: 21 mmol/L — ABNORMAL LOW (ref 22–32)
Calcium: 7.8 mg/dL — ABNORMAL LOW (ref 8.9–10.3)
Chloride: 106 mmol/L (ref 98–111)
Creatinine, Ser: 0.57 mg/dL (ref 0.44–1.00)
GFR, Estimated: >60 mL/min (ref >60)
Glucose, Bld: 83 mg/dL (ref 70–99)
Potassium: 3.7 mmol/L (ref 3.5–5.1)
Sodium: 132 mmol/L — ABNORMAL LOW (ref 135–145)
Total Bilirubin: 1.4 mg/dL — ABNORMAL HIGH (ref 0.3–1.2)
Total Protein: 4.5 g/dL — ABNORMAL LOW (ref 6.5–8.1)

## 2021-01-05 LAB — PREPARE PLATELET PHERESIS: Unit division: 0

## 2021-01-05 LAB — CBC
HCT: 28.8 % — ABNORMAL LOW (ref 36.0–46.0)
Hemoglobin: 9.3 g/dL — ABNORMAL LOW (ref 12.0–15.0)
MCH: 30.5 pg (ref 26.0–34.0)
MCHC: 32.3 g/dL (ref 30.0–36.0)
MCV: 94.4 fL (ref 80.0–100.0)
Platelets: 79 10*3/uL — ABNORMAL LOW (ref 150–400)
RBC: 3.05 MIL/uL — ABNORMAL LOW (ref 3.87–5.11)
RDW: 15.9 % — ABNORMAL HIGH (ref 11.5–15.5)
WBC: 7.6 10*3/uL (ref 4.0–10.5)
nRBC: 0 % (ref 0.0–0.2)

## 2021-01-05 LAB — COOXEMETRY PANEL
Carboxyhemoglobin: 1.8 % — ABNORMAL HIGH (ref 0.5–1.5)
Carboxyhemoglobin: 1.7 % — ABNORMAL HIGH (ref 0.5–1.5)
Carboxyhemoglobin: 2 % — ABNORMAL HIGH (ref 0.5–1.5)
Methemoglobin: 0.9 % (ref 0.0–1.5)
Methemoglobin: 0.9 % (ref 0.0–1.5)
Methemoglobin: 0.8 % (ref 0.0–1.5)
O2 Saturation: 69 %
O2 Saturation: 54.5 %
O2 Saturation: 48.5 %
Total hemoglobin: 9.4 g/dL — ABNORMAL LOW (ref 12.0–16.0)
Total hemoglobin: 11.2 g/dL — ABNORMAL LOW (ref 12.0–16.0)
Total hemoglobin: 9.9 g/dL — ABNORMAL LOW (ref 12.0–16.0)

## 2021-01-05 LAB — MAGNESIUM: Magnesium: 1.9 mg/dL (ref 1.7–2.4)

## 2021-01-05 LAB — BPAM PLATELET PHERESIS
Blood Product Expiration Date: 202210032359
ISSUE DATE / TIME: 202210030926
Unit Type and Rh: 6200

## 2021-01-05 LAB — LIPID PANEL
Cholesterol: 96 mg/dL (ref 0–200)
HDL: 22 mg/dL — ABNORMAL LOW (ref >40)
LDL Cholesterol: 56 mg/dL (ref 0–99)
Total CHOL/HDL Ratio: 4.4 RATIO
Triglycerides: 92 mg/dL (ref <150)
VLDL: 18 mg/dL (ref 0–40)

## 2021-01-05 LAB — DIGOXIN LEVEL: Digoxin Level: 0.5 ng/mL — ABNORMAL LOW (ref 0.8–2.0)

## 2021-01-05 MED ORDER — ENOXAPARIN SODIUM 40 MG/0.4ML IJ SOSY
40.0000 mg | PREFILLED_SYRINGE | INTRAMUSCULAR | Status: DC
  Administered 2021-01-05 – 2021-01-11 (×7): 40 mg via SUBCUTANEOUS
  Filled 2021-01-05 (×7): qty 0.4

## 2021-01-05 MED ORDER — SORBITOL 70 % SOLN
60.0000 mL | Freq: Once | Status: AC
  Administered 2021-01-05: 60 mL via ORAL
  Filled 2021-01-05: qty 60

## 2021-01-05 MED ORDER — NOREPINEPHRINE 4 MG/250ML-% IV SOLN
INTRAVENOUS | Status: AC
  Administered 2021-01-05: 2 ug/min via INTRAVENOUS
  Filled 2021-01-05: qty 250

## 2021-01-05 MED ORDER — MAGNESIUM SULFATE 2 GM/50ML IV SOLN
2.0000 g | Freq: Once | INTRAVENOUS | Status: AC
  Administered 2021-01-05: 2 g via INTRAVENOUS
  Filled 2021-01-05: qty 50

## 2021-01-05 MED ORDER — NOREPINEPHRINE 4 MG/250ML-% IV SOLN: 0.0000 ug/min | INTRAVENOUS | Status: DC

## 2021-01-05 MED ORDER — MILRINONE LACTATE IN DEXTROSE 20-5 MG/100ML-% IV SOLN
0.1250 ug/kg/min | INTRAVENOUS | Status: DC
  Administered 2021-01-05: 0.25 ug/kg/min via INTRAVENOUS
  Filled 2021-01-05: qty 100

## 2021-01-05 MED ORDER — MILRINONE LACTATE IN DEXTROSE 20-5 MG/100ML-% IV SOLN
0.1250 ug/kg/min | INTRAVENOUS | Status: DC
  Filled 2021-01-05: qty 100

## 2021-01-05 MED ORDER — LOSARTAN POTASSIUM 25 MG PO TABS
25.0000 mg | ORAL_TABLET | Freq: Two times a day (BID) | ORAL | Status: DC
  Administered 2021-01-05: 25 mg via ORAL
  Filled 2021-01-05: qty 1

## 2021-01-05 NOTE — Progress Notes (Signed)
   01/05/21 1010  Clinical Encounter Type  Visited With Patient and family together  Visit Type Follow-up;Psychological support;Social support;Critical Care   CH visited pt. and dtr. at bedside briefly today after observing baloons in the room and pt. wearing a crown.  Pt. shared that today is her 75th birthday; pt. and dtr. are very grateful for pt.'s current progress in recovery in light of what has been at times a very guarded prognosis this admission.  Pt.'s sons will visit later today; pt. and dtr. appreciated visit and support.  Elpidio Anis, Chaplain Pager: [phone]

## 2021-01-05 NOTE — Consult Note (Signed)
WOC Nurse Consult Note: Patient receiving care in [facility] 2H17. Primary RN at bedside at time of my assessment. Reason for Consult: bilateral groin device site wounds and lower abdominal fold skin impairment. Wound type: Medical device related skin impairment. One groin had an Impella, the other groin had a PCI, and there was a FemStop that impacted the lower abdominal fold area. Pressure Injury POA: Yes/No/NA Measurement: deferred. Both groin sites are moist. The right groin area has a small yellow wound and 2 pink superficial wounds. The left groin is oozing. The abdominal fold is minimally impacted by the Fem Stop. Wound bed: Drainage (amount, consistency, odor)  Periwound: Dressing procedure/placement/frequency: Cleanse bilateral groin areas and abdominal fold area with saline. Pat dry. Cut Aquacel [place] [phone]) into size appropriate pieces and place over the groin device insertion sites, and the lower abdominal fold area. Cover with dry gauze, use a small amount of tape to tape in place. Change daily. MOISTEN WITH SALINE if necessary to remove without causing further damage.  I also recommended that the primary RN make sure the medical team sees the areas so they will be aware.  She has agreed to do so.  Monitor the wound area(s) for worsening of condition such as: Signs/symptoms of infection,  Increase in size,  Development of or worsening of odor, Development of pain, or increased pain at the affected locations.  Notify the medical team if any of these develop.  Thank you for the consult.  Discussed plan of care with the patient and bedside nurse.  WOC nurse will not follow at this time.  Please re-consult the WOC team if needed.  Helmut Muster, RN, MSN, CWOCN, CNS-BC, pager [phone]

## 2021-01-05 NOTE — Progress Notes (Signed)
Dr. Teressa Lower shown and made aware of pts skin at groin sites of previous impella and PCI as well as from femstop device.

## 2021-01-05 NOTE — Evaluation (Signed)
Occupational Therapy Evaluation Patient Details Name: Jane Navarro MRN: [id number] DOB: [birth year] Today's Date: 01/05/2021   History of Present Illness Pt is a 75 y.o. female admitted 12/29/20 with NSTEMI, cardiogenic shock. 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 S/p CP impella placed 9/27-10/3. S/p PCI to RCA DES x3 on 9/30; course complicated by hypotension, post-op bleeding in groin. PMH includes anemia, CVA, tobacco use; of note, recent admission 12/05/20 after pt found passed out in car in [employer] parking lot, found to have severe hypothyroidism.   Clinical Impression   Pt typically mod I for ADL and mobility with RW and shower seat. Today Pt is min A +2 for bed mobility, mod A +2 for transfers with RW. Pt is max A for LB ADL due to pain and decreased ROM and ranges from set up to mod A for UB ADL. Pt is motivated to participate with therapy. Pt will benefit from skilled OT in the acute setting as well as at the CIR level to maximize safety and indpendence in ADL and functional transfers, access to LB, and overall activity tolerance to return to PLOF. Next session plan for continued OOB activity and potentially education for AE to increased access to LB.       Recommendations for follow up therapy are one component of a multi-disciplinary discharge planning process, led by the attending physician.  Recommendations may be updated based on patient status, additional functional criteria and insurance authorization.   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 Restrictions Weight Bearing Restrictions: No Other Position/Activity Restrictions: be aware of L knee pain with bearing weight      Mobility Bed Mobility Overal bed mobility: Needs Assistance Bed Mobility: Supine to Sit Rolling: Min assist   Supine to sit: Mod assist;+2 for physical assistance;+2 for safety/equipment;HOB elevated Sit to supine: Mod assist   General bed mobility comments: Pt with good movement initiation, cues for sequencing; modA+2 for BLE management, trunk elevation and scooting hips to EOB    Transfers Overall transfer level: Needs assistance Equipment used: 2 person hand held assist Transfers: Sit to/from [employer] Transfers Sit to Stand: Min assist;+2 physical assistance;+2 safety/equipment Stand pivot transfers: Mod assist;+2 physical assistance;+2 safety/equipment       General transfer comment: MinA+2 to stand from EOB with bilateral HHA, pt with good awareness of transfer set-up from prior experience; modA+2 to complete pivot to recliner as pt having difficulty with weight shift and taking step with RLE    Balance Overall balance assessment: Needs assistance Sitting-balance support: No upper extremity supported;Feet supported Sitting balance-Leahy Scale: Fair Sitting balance - Comments: sitting on edge of chiar upon arrival   Standing balance support: Bilateral upper extremity supported Standing balance-Leahy Scale: Poor Standing balance comment: Reliant on UE support and external assist                           ADL either performed or assessed with clinical judgement   ADL Overall ADL's : Needs assistance/impaired Eating/Feeding: Set up;Sitting   Grooming: Wash/dry face;Min guard;Sitting Grooming Details (indicate cue type and reason): in recliner Upper  Body Bathing: Sitting;Minimal assistance   Lower Body Bathing: Sit to/from stand;Moderate assistance Lower Body Bathing Details (indicate cue type and reason): knees  down Upper Body Dressing : Minimal assistance;Sitting Upper Body Dressing Details (indicate cue type and reason): to don extra gown like robe Lower Body Dressing: Maximal assistance;Sitting/lateral leans Lower Body Dressing Details (indicate cue type and reason): to don socks Toilet Transfer: RW;Moderate assistance;+2 for physical assistance;+2 for safety/equipment Toilet Transfer Details (indicate cue type and reason): short pivot ambulation, fatigues quickly Toileting- Clothing Manipulation and Hygiene: Sitting/lateral lean;Maximal assistance       Functional mobility during ADLs: Rolling walker;Moderate assistance;+2 for physical assistance;+2 for safety/equipment;Cueing for safety General ADL Comments: decreased activity tolerance, access to LB due to incisional pain, balance     Vision Baseline Vision/History: 1 Wears glasses Ability to See in Adequate Light: 0 Adequate Patient Visual Report: No change from baseline       Perception     Praxis      Pertinent Vitals/Pain Pain Assessment: Faces Faces Pain Scale: Hurts a little bit Pain Location: Bilateral knees (L>R) Pain Descriptors / Indicators: Discomfort;Grimacing Pain Intervention(s): Monitored during session;Repositioned     Hand Dominance Right   Extremity/Trunk Assessment Upper Extremity Assessment Upper Extremity Assessment: Generalized weakness   Lower Extremity Assessment Lower Extremity Assessment: Defer to PT evaluation   Cervical / Trunk Assessment Cervical / Trunk Assessment: Kyphotic   Communication Communication Communication: No difficulties   Cognition Arousal/Alertness: Awake/alert Behavior During Therapy: WFL for tasks assessed/performed Overall Cognitive Status: Within Functional Limits for tasks assessed                                 General Comments: WFL for simple tasks; pt following simple commands appropriately with good awareness of mobility, at times requiring cues  for sequencing, suspect mainly limited by fatigue   General Comments  Daughter present and beginning of session and very supportive. Supine BP 111/65, sitting BP 106/71, post-transer BP 120/78    Exercises     Shoulder Instructions      Home Living Family/patient expects to be discharged to:: Private residence Living Arrangements: Children Available Help at Discharge: Family;Available PRN/intermittently Type of Home: House Home Access: Stairs to enter (son can build ramp) Secretary/administrator of Steps: 3-4 Entrance Stairs-Rails: None Home Layout: One level     Bathroom Shower/Tub: Chief Strategy Officer: Standard     Home Equipment: Environmental consultant - 2 wheels;Toilet riser;Transport chair   Additional Comments: Likely plan to d/c to son's home as family does not want pt living alone      Prior Functioning/Environment Level of Independence: Independent with assistive device(s)        Comments: Typically mod indep ambulating with walker; lives alone, enjoys time with dog Dia Sitter"); increased assist from family since recent admission        OT Problem List: Decreased strength;Decreased range of motion;Impaired balance (sitting and/or standing);Decreased cognition;Decreased safety awareness;Pain      OT Treatment/Interventions: Self-care/ADL training;DME and/or AE instruction;Patient/family education;Balance training    OT Goals(Current goals can be found in the care plan section) Acute Rehab OT Goals Patient Stated Goal: Get back home to dog; likely move in with son at d/c OT Goal Formulation: With patient Time For Goal Achievement: 12/20/20 Potential to Achieve Goals: Good ADL Goals Pt Will Perform Grooming: with set-up;sitting Pt Will Perform Upper Body Dressing: with set-up;sitting Pt Will Perform Lower Body  Dressing: with mod assist;sit to/from stand;with caregiver independent in assisting Pt Will Transfer to Toilet: with min guard assist;stand pivot  transfer;bedside commode Pt Will Perform Toileting - Clothing Manipulation and hygiene: with supervision;sitting/lateral leans Additional ADL Goal #1: Pt will perform bed mobility at supervision level prior to engaging in ADL  OT Frequency: Min 2X/week   Barriers to D/C: Decreased caregiver support  Per daughter, Pt will not be living alone anymore - she will be moving to son's house for extra support and supervision       Co-evaluation PT/OT/SLP Co-Evaluation/Treatment: Yes Reason for Co-Treatment: Complexity of the patient's impairments (multi-system involvement);For patient/therapist safety;To address functional/ADL transfers PT goals addressed during session: Mobility/safety with mobility;Balance;Proper use of DME;Strengthening/ROM OT goals addressed during session: ADL's and self-care;Strengthening/ROM      AM-PAC OT "6 Clicks" Daily Activity     Outcome Measure Help from another person eating meals?: A Little Help from another person taking care of personal grooming?: A Little Help from another person toileting, which includes using toliet, bedpan, or urinal?: A Lot Help from another person bathing (including washing, rinsing, drying)?: A Lot Help from another person to put on and taking off regular upper body clothing?: A Little Help from another person to put on and taking off regular lower body clothing?: A Lot 6 Click Score: 15   End of Session Equipment Utilized During Treatment: Gait belt;Rolling walker Nurse Communication: Mobility status  Activity Tolerance: Patient tolerated treatment well Patient left: in bed;with nursing/sitter in room (CNA present to complete bathing tasks)  OT Visit Diagnosis: Unsteadiness on feet (R26.81);Other abnormalities of gait and mobility (R26.89);Muscle weakness (generalized) (M62.81);Other symptoms and signs involving cognitive function;Pain Pain - Right/Left: Left Pain - part of body: Knee                Time: 5009-3818 OT Time  Calculation (min): 29 min Charges:  OT General Charges $OT Visit: 1 Visit OT Evaluation $OT Eval Moderate Complexity: 1 Mod  Nyoka Cowden OTR/L Acute Rehabilitation Services Pager: [phone] Office: [phone]  Evern Bio Elvy Mclarty 01/05/2021, 12:53 PM

## 2021-01-05 NOTE — Evaluation (Signed)
Physical Therapy Evaluation Patient Details Name: Jane Navarro MRN: [id number] DOB: Oct 11, 1945 Today's Date: 01/05/2021  History of Present Illness  Pt is a 75 y.o. female admitted 12/29/20 with NSTEMI, cardiogenic shock. 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 S/p CP impella placed 9/27-10/3. S/p PCI to RCA DES x3 on 9/30; course complicated by hypotension, post-op bleeding in groin. PMH includes anemia, CVA, tobacco use; of note, recent admission 12/05/20 after pt found passed out in car in [employer] parking lot, found to have severe hypothyroidism.   Clinical Impression  Pt presents with an overall decrease in functional mobility secondary to above. PTA, pt mod indep ambulating with RW, lives alone with likely plan to move in to son's home at d/c for increased family support. Today, pt able to initiate bed mobility and standing transfer with modA+2; pt limited by fatigue, but very motivated to participate. Pending progression of activity tolerance, suspect pt will benefit from intensive CIR-level therapies to maximize functional mobility and independence prior to d/c home.   Supine BP 111/65 Sitting BP 106/71 Post-transfer BP 120/78     Recommendations for follow up therapy are one component of a multi-disciplinary discharge planning process, led by the attending physician.  Recommendations may be updated based on patient status, additional functional criteria and insurance authorization.  Follow Up Recommendations CIR;Supervision for mobility/OOB    Equipment Recommendations   (TBD)    Recommendations for Other Services Rehab consult     Precautions / Restrictions Precautions Precautions: Fall;Other (comment) Precaution Comments: Multiple lines, bilateral groin wounds      Mobility  Bed Mobility Overal bed mobility: Needs Assistance Bed Mobility: Supine to Sit     Supine to sit: Mod assist;+2 for physical assistance;+2 for safety/equipment;HOB elevated     General bed mobility  comments: Pt with good movement initiation, cues for sequencing; modA+2 for BLE management, trunk elevation and scooting hips to EOB    Transfers Overall transfer level: Needs assistance Equipment used: 2 person hand held assist Transfers: Sit to/from [employer] Sit to Stand: Min assist;+2 physical assistance;+2 safety/equipment Stand pivot transfers: Mod assist;+2 physical assistance;+2 safety/equipment       General transfer comment: MinA+2 to stand from EOB with bilateral HHA, pt with good awareness of transfer set-up from prior experience; modA+2 to complete pivot to recliner as pt having difficulty with weight shift and taking step with RLE  Ambulation/Gait                Stairs            Wheelchair Mobility    Modified Rankin (Stroke Patients Only)       Balance Overall balance assessment: Needs assistance Sitting-balance support: No upper extremity supported;Feet supported Sitting balance-Leahy Scale: Fair     Standing balance support: Bilateral upper extremity supported Standing balance-Leahy Scale: Poor Standing balance comment: Reliant on UE support and external assist                             Pertinent Vitals/Pain Pain Assessment: Faces Faces Pain Scale: Hurts a little bit Pain Location: Bilateral knees (L>R) Pain Descriptors / Indicators: Discomfort;Grimacing Pain Intervention(s): Monitored during session;Limited activity within patient's tolerance;Repositioned    Home Living Family/patient expects to be discharged to:: Private residence Living Arrangements: Children Available Help at Discharge: Family;Available PRN/intermittently Type of Home: House Home Access: Stairs to enter (son can build ramp) Entrance Stairs-Rails: None Entrance Stairs-Number of Steps: 3-4 Home Layout: One  level Home Equipment: Walker - 2 wheels;Toilet riser;Transport chair Additional Comments: Likely plan to d/c to son's home as  family does not want pt living alone    Prior Function Level of Independence: Independent with assistive device(s)         Comments: Typically mod indep ambulating with walker; lives alone, enjoys time with dog Dia Sitter"); increased assist from family since recent admission     Hand Dominance        Extremity/Trunk Assessment   Upper Extremity Assessment Upper Extremity Assessment: Generalized weakness    Lower Extremity Assessment Lower Extremity Assessment: Generalized weakness    Cervical / Trunk Assessment Cervical / Trunk Assessment: Kyphotic  Communication   Communication: No difficulties  Cognition Arousal/Alertness: Awake/alert Behavior During Therapy: WFL for tasks assessed/performed Overall Cognitive Status: Within Functional Limits for tasks assessed                                 General Comments: WFL for simple tasks; pt following simple commands appropriately with good awareness of mobility, at times requiring cues for sequencing, suspect mainly limited by fatigue      General Comments General comments (skin integrity, edema, etc.): Daughter present and beginning of session and very supportive. Supine BP 111/65, sitting BP 106/71, post-transer BP 120/78    Exercises     Assessment/Plan    PT Assessment Patient needs continued PT services  PT Problem List Decreased strength;Decreased range of motion;Decreased activity tolerance;Decreased mobility;Decreased balance;Decreased coordination;Decreased cognition;Decreased knowledge of use of DME;Decreased knowledge of precautions;Cardiopulmonary status limiting activity       PT Treatment Interventions DME instruction;Gait training;Stair training;Functional mobility training;Therapeutic activities;Therapeutic exercise;Balance training;Patient/family education    PT Goals (Current goals can be found in the Care Plan section)  Acute Rehab PT Goals Patient Stated Goal: Get back home to dog;  likely move in with son at d/c PT Goal Formulation: With patient/family Time For Goal Achievement: 01/19/21 Potential to Achieve Goals: Good    Frequency Min 3X/week   Barriers to discharge        Co-evaluation PT/OT/SLP Co-Evaluation/Treatment: Yes Reason for Co-Treatment: Complexity of the patient's impairments (multi-system involvement);For patient/therapist safety;To address functional/ADL transfers PT goals addressed during session: Mobility/safety with mobility;Balance         AM-PAC PT "6 Clicks" Mobility  Outcome Measure Help needed turning from your back to your side while in a flat bed without using bedrails?: A Lot Help needed moving from lying on your back to sitting on the side of a flat bed without using bedrails?: A Lot Help needed moving to and from a bed to a chair (including a wheelchair)?: A Lot Help needed standing up from a chair using your arms (e.g., wheelchair or bedside chair)?: A Lot Help needed to walk in hospital room?: Total Help needed climbing 3-5 steps with a railing? : Total 6 Click Score: 10    End of Session Equipment Utilized During Treatment: Gait belt Activity Tolerance: Patient tolerated treatment well;Patient limited by fatigue Patient left: in chair;with call bell/phone within reach;with chair alarm set Nurse Communication: Mobility status PT Visit Diagnosis: Other abnormalities of gait and mobility (R26.89);Muscle weakness (generalized) (M62.81)    Time: 2671-2458 PT Time Calculation (min) (ACUTE ONLY): 29 min   Charges:   PT Evaluation $PT Eval Moderate Complexity: 1 Mod     Ina Homes, PT, DPT Acute Rehabilitation Services  Pager [phone] Office [phone]  Malachy Chamber  01/05/2021, 12:38 PM

## 2021-01-05 NOTE — Progress Notes (Signed)
Inpatient Rehab Admissions Coordinator :  Per therapy recommendations, patient was screened for CIR candidacy by Ottie Glazier RN MSN.  At this time patient appears to be a potential candidate for CIR. Please place a rehab consult if you would like patient considered for eventual Cir admit. Please advise.  Ottie Glazier RN MSN Admissions Coordinator [phone]

## 2021-01-05 NOTE — Progress Notes (Addendum)
Patient ID: Jane Navarro, female   DOB: July 27, 1945, 75 y.o.   MRN: [id number]     Advanced Heart Failure Rounding Note  PCP-Cardiologist: Parke Poisson, MD   Subjective:    - 9/27: NSTEMI-->Cardiogenic shock. Impella CP placed and started on milrinone. Given 2 UPRBCs. Milrinone increased to 0.375 mcg.  - 9/28: Continued on milrinone 0.375 mcg + impella P8. Started on losartan and spiro.  - 9/30:  PCI to RCA overlapping DES x 3, deferred intervention on left system.  Groin site bleeding, femstop applied and 2 units PRBCs given.  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  - 10/3: Impella extracted   Remains on milrinone 0.375. Co-ox stable, 69%.   Groin stable post Impella extraction w/ stable Hgb at 9.3.   Plts up 33>>34>>79 . HIT negative, SRA pending. Now off Bival  Swan# s CVP 5 PA 26/7  CI 2.42 CO 3.89 PCW 5 SVR 1500 Co-ox 69%  LDH 1,119>>1,435>>734>>492  LFTs improving  AST 604>>668>>394>>95>56 ALT 695>>725>>579>>241>171   Scr stable 0.81>>0.68>>0.61>0.61>>0.57      Objective:   Weight Range: 61.2 kg Body mass index is 26.35 kg/m.   Vital Signs:   Temp:  [97 F (36.1 C)-98.8 F (37.1 C)] 97.2 F (36.2 C) (10/04 0500) Pulse Rate:  [72-89] 72 (10/04 0500) Resp:  [11-22] 12 (10/04 0500) BP: (92-117)/(58-78) 101/70 (10/04 0500) SpO2:  [95 %-99 %] 99 % (10/04 0500) Arterial Line BP: (92-134)/(44-63) 129/62 (10/04 0500) Last BM Date: 12/28/20  Weight change: Filed Weights   01/01/21 0420 01/02/21 0500 01/03/21 0530  Weight: 64.1 kg 59.9 kg 61.2 kg    Intake/Output:   Intake/Output Summary (Last 24 hours) at 01/05/2021 0822 Last data filed at 01/05/2021 0600 Gross per 24 hour  Intake 878.92 ml  Output 870 ml  Net 8.92 ml     Physical Exam   General:  Well appearing elderly WF. No respiratory difficulty HEENT: dentition in poor repair normal Neck: supple. no JVD. + rt IJ swan. Carotids 2+ bilat; no bruits. No lymphadenopathy or  thyromegaly appreciated. Cor: PMI nondisplaced. Regular rate & rhythm. No rubs, gallops or murmurs. Lungs: clear Abdomen: soft, nontender, nondistended. No hepatosplenomegaly. No bruits or masses. Good bowel sounds. Extremities: no cyanosis, clubbing, rash, edema, Rt groin stable  Neuro: alert & oriented x 3, cranial nerves grossly intact. moves all 4 extremities w/o difficulty. Affect pleasant.   Telemetry   NSR 80s personally reviewed.   EKG    N/A   Labs    CBC Recent Labs    01/04/21 0442 01/05/21 0446  WBC 8.5 7.6  HGB 9.3* 9.3*  HCT 28.2* 28.8*  MCV 93.7 94.4  PLT 34* 79*   Basic Metabolic Panel Recent Labs    75/64/33 0442 01/05/21 0446  NA 135 132*  K 4.0 3.7  CL 110 106  CO2 20* 21*  GLUCOSE 100* 83  BUN 15 11  CREATININE 0.61 0.57  CALCIUM 7.8* 7.8*  MG 2.0 1.9   Liver Function Tests Recent Labs    01/04/21 0442 01/05/21 0446  AST 95* 56*  ALT 241* 171*  ALKPHOS 83 83  BILITOT 1.1 1.4*  PROT 4.3* 4.5*  ALBUMIN 1.9* 1.9*   No results for input(s): LIPASE, AMYLASE in the last 72 hours. Cardiac Enzymes No results for input(s): CKTOTAL, CKMB, CKMBINDEX, TROPONINI in the last 72 hours.  BNP: BNP (last 3 results) Recent Labs    12/29/20 0014  BNP >4,500.0*    ProBNP (last  3 results) No results for input(s): PROBNP in the last 8760 hours.   D-Dimer No results for input(s): DDIMER in the last 72 hours. Hemoglobin A1C No results for input(s): HGBA1C in the last 72 hours. Fasting Lipid Panel Recent Labs    01/05/21 0446  CHOL 96  HDL 22*  LDLCALC 56  TRIG 92  CHOLHDL 4.4   Thyroid Function Tests No results for input(s): TSH, T4TOTAL, T3FREE, THYROIDAB in the last 72 hours.  Invalid input(s): FREET3   Other results:   Imaging    No results found.   Medications:     Scheduled Medications:  aspirin EC  81 mg Oral Daily   atorvastatin  80 mg Oral Daily   chlorhexidine  15 mL Mouth Rinse BID   Chlorhexidine  Gluconate Cloth  6 each Topical Daily   digoxin  0.125 mg Oral Daily   docusate sodium  200 mg Oral Daily   ezetimibe  10 mg Oral Daily   feeding supplement  237 mL Oral BID BM   levothyroxine  100 mcg Oral Q0600   losartan  12.5 mg Oral BID   mouth rinse  15 mL Mouth Rinse q12n4p   polyethylene glycol  17 g Oral Daily   senna  2 tablet Oral Daily   sodium chloride flush  10-40 mL Intracatheter Q12H   sodium chloride flush  3 mL Intravenous Q12H   sorbitol  60 mL Oral Once   spironolactone  12.5 mg Oral Daily   thiamine  100 mg Oral Daily   ticagrelor  90 mg Oral BID    Infusions:  sodium chloride 20 mL/hr at 01/05/21 0600   sodium chloride 10 mL/hr at 01/03/21 0443   milrinone      PRN Medications: Place/Maintain arterial line **AND** sodium chloride, acetaminophen, fentaNYL (SUBLIMAZE) injection, lip balm, ondansetron (ZOFRAN) IV, oxyCODONE-acetaminophen, sodium chloride, sodium chloride flush    Patient Profile   Jane Navarro is a 75 year old with history of htn, hyperlipidemia, CVA, hypothyroidism, and CKD Stage IIIa.   Admitted with chest pain--> NSTEMI -->cardiogenic shock    Assessment/Plan  NSTEMI--->CAD severe multivessel diseease-->Cardiogenic Shock - HS Trop 8119->1478. ECHO EF 25-30% and severely reduced RV function.  - LHC with severe L main with multivessel  proximal LAD and mild RCA .  - In the cath lab PA sat < 40%., CI 1.6.  Placed Impella 2.5 placed and started on milrinone  - Felt too high risk for CABG.  - 9/30 PCI to RCA with DES x 3, deferred intervention on left main with difficulty wiring the LAD and non-flow limiting dissection.  Will plan PCI several weeks down the road to allow healing.   - Continue ASA 81, ticagrelor, atorvastatin.   2.  Cardiogenic shock: Ischemic cardiomyopathy.  Echo with EF 25-30%, severely decreased RV function, moderate-severe TR, moderate MR. Required MCS w/ Impella. Impella removed 10/3. Remains on Milrinone 0.375. Co-ox  stable at 67%   - Wean milrinone to 0.25 today  - Continue digoxin 0.125 mcg  - Continue 12.5 mg losartan bid and 12.5 mg spironolactone.  - Renal function stable.  - No diuretic with low CVP.  2. Shock Liver  - LFTs now trending down.    3. Anemia  - Hgb dropped to 6.5 initially>> given 2UPRBCS with appropriate rise to 8.9.  - 1U RBCs given 9/29, Hgb 7.6>>9.1 - Significant hematoma left groin post-intervention, had 2 units PRBCs 9/30 with appropriate increase in hgb. IABP removed 10/3. Site  remains stable  - Hgb currently 9.3, stable.    4. Hypothyroidism  -TSH 4.8  -Continue levothyroxine.    5. UTI  - Urine culture NGTD - Completed course of rocephin    6. H/O CVA  7. AKI:  - resolved - SCr 0.57 today   8. 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 Platelets hit a nadir at 33K.  Preliminary HIT negative, SRA pending.   -plts trending up, 33>>79   9. Constipation  - c/w bowel regimen - add sorbitol    Robbie Lis, PA-C  01/05/2021 8:22 AM  Agree with above  Impella pulled yesterday. Remains on milrinone 0.375. Hemodynamics stable (swan numbers done personally). Denies CP or SOB . Platelets improving  General:  Lying in bed No resp difficulty HEENT: normal Neck: supple. RIJ swan Carotids 2+ bilat; no bruits. No lymphadenopathy or thryomegaly appreciated. Cor: PMI nondisplaced. Regular rate & rhythm. No rubs, gallops or murmurs. Lungs: clear Abdomen: soft, nontender, nondistended. No hepatosplenomegaly. No bruits or masses. Good bowel sounds. Extremities: no cyanosis, clubbing, rash, tr edema Groin sites with skin breakdown Neuro: alert & orientedx3, cranial nerves grossly intact. moves all 4 extremities w/o difficulty. Affect pleasant  Hemodynamics stable. Will wean milrinone to 0.25. Pull swan and Foley. No diuretics today. Will increase losartan. No b-blocker yet. Will need to mobilize. Aggressive wound care to groin  sites.   PT/OT to see. Start lovenox.   CRITICAL CARE Performed by: Arvilla Meres  Total critical care time: 35 minutes  Critical care time was exclusive of separately billable procedures and treating other patients.  Critical care was necessary to treat or prevent imminent or life-threatening deterioration.  Critical care was time spent personally by me (independent of midlevel providers or residents) on the following activities: development of treatment plan with patient and/or surrogate as well as nursing, discussions with consultants, evaluation of patient's response to treatment, examination of patient, obtaining history from patient or surrogate, ordering and performing treatments and interventions, ordering and review of laboratory studies, ordering and review of radiographic studies, pulse oximetry and re-evaluation of patient's condition.  Arvilla Meres, MD  9:08 AM    .

## 2021-01-06 ENCOUNTER — Inpatient Hospital Stay: Payer: Self-pay

## 2021-01-06 DIAGNOSIS — I214 Non-ST elevation (NSTEMI) myocardial infarction: Secondary | ICD-10-CM | POA: Diagnosis not present

## 2021-01-06 LAB — BASIC METABOLIC PANEL
Anion gap: 8 (ref 5–15)
BUN: 10 mg/dL (ref 8–23)
CO2: 22 mmol/L (ref 22–32)
Calcium: 8.3 mg/dL — ABNORMAL LOW (ref 8.9–10.3)
Chloride: 103 mmol/L (ref 98–111)
Creatinine, Ser: 0.8 mg/dL (ref 0.44–1.00)
GFR, Estimated: >60 mL/min (ref >60)
Glucose, Bld: 93 mg/dL (ref 70–99)
Potassium: 3.3 mmol/L — ABNORMAL LOW (ref 3.5–5.1)
Sodium: 133 mmol/L — ABNORMAL LOW (ref 135–145)

## 2021-01-06 LAB — COMPREHENSIVE METABOLIC PANEL
ALT: 140 U/L — ABNORMAL HIGH (ref 0–44)
AST: 41 U/L (ref 15–41)
Albumin: 2 g/dL — ABNORMAL LOW (ref 3.5–5.0)
Alkaline Phosphatase: 70 U/L (ref 38–126)
Anion gap: 7 (ref 5–15)
BUN: 12 mg/dL (ref 8–23)
CO2: 18 mmol/L — ABNORMAL LOW (ref 22–32)
Calcium: 8.2 mg/dL — ABNORMAL LOW (ref 8.9–10.3)
Chloride: 107 mmol/L (ref 98–111)
Creatinine, Ser: 0.64 mg/dL (ref 0.44–1.00)
GFR, Estimated: >60 mL/min (ref >60)
Glucose, Bld: 93 mg/dL (ref 70–99)
Potassium: 3.9 mmol/L (ref 3.5–5.1)
Sodium: 132 mmol/L — ABNORMAL LOW (ref 135–145)
Total Bilirubin: 1.3 mg/dL — ABNORMAL HIGH (ref 0.3–1.2)
Total Protein: 4.7 g/dL — ABNORMAL LOW (ref 6.5–8.1)

## 2021-01-06 LAB — CBC
HCT: 28.1 % — ABNORMAL LOW (ref 36.0–46.0)
Hemoglobin: 9.3 g/dL — ABNORMAL LOW (ref 12.0–15.0)
MCH: 31 pg (ref 26.0–34.0)
MCHC: 33.1 g/dL (ref 30.0–36.0)
MCV: 93.7 fL (ref 80.0–100.0)
Platelets: 141 10*3/uL — ABNORMAL LOW (ref 150–400)
RBC: 3 MIL/uL — ABNORMAL LOW (ref 3.87–5.11)
RDW: 15.7 % — ABNORMAL HIGH (ref 11.5–15.5)
WBC: 7.6 10*3/uL (ref 4.0–10.5)
nRBC: 0 % (ref 0.0–0.2)

## 2021-01-06 LAB — COOXEMETRY PANEL
Carboxyhemoglobin: 2.2 % — ABNORMAL HIGH (ref 0.5–1.5)
Methemoglobin: 0.9 % (ref 0.0–1.5)
O2 Saturation: 55.5 %
Total hemoglobin: 9.6 g/dL — ABNORMAL LOW (ref 12.0–16.0)

## 2021-01-06 LAB — SEROTONIN RELEASE ASSAY (SRA)
SRA .2 IU/mL UFH Ser-aCnc: <1 % (ref 0–20)
SRA 100IU/mL UFH Ser-aCnc: <1 % (ref 0–20)

## 2021-01-06 LAB — MAGNESIUM
Magnesium: 2.3 mg/dL (ref 1.7–2.4)
Magnesium: 2 mg/dL (ref 1.7–2.4)

## 2021-01-06 MED ORDER — SPIRONOLACTONE 25 MG PO TABS
25.0000 mg | ORAL_TABLET | Freq: Every day | ORAL | Status: DC
  Administered 2021-01-06 – 2021-01-11 (×6): 25 mg via ORAL
  Filled 2021-01-06 (×6): qty 1

## 2021-01-06 MED ORDER — SODIUM CHLORIDE 0.9% FLUSH
10.0000 mL | Freq: Two times a day (BID) | INTRAVENOUS | Status: DC
  Administered 2021-01-06: 10 mL
  Administered 2021-01-06: 20 mL
  Administered 2021-01-07 – 2021-01-10 (×7): 10 mL
  Administered 2021-01-10: 20 mL
  Administered 2021-01-11: 10 mL

## 2021-01-06 MED ORDER — SODIUM CHLORIDE 0.9% FLUSH: 10.0000 mL | INTRAVENOUS | Status: DC | PRN

## 2021-01-06 MED ORDER — POTASSIUM CHLORIDE 10 MEQ/100ML IV SOLN: 10.0000 meq | INTRAVENOUS | Status: DC

## 2021-01-06 MED ORDER — POTASSIUM CHLORIDE 10 MEQ/50ML IV SOLN
10.0000 meq | INTRAVENOUS | Status: AC
  Administered 2021-01-06 – 2021-01-07 (×2): 10 meq via INTRAVENOUS
  Filled 2021-01-06 (×2): qty 50

## 2021-01-06 MED ORDER — FUROSEMIDE 40 MG PO TABS
40.0000 mg | ORAL_TABLET | Freq: Once | ORAL | Status: AC
  Administered 2021-01-06: 40 mg via ORAL
  Filled 2021-01-06: qty 1

## 2021-01-06 NOTE — Progress Notes (Signed)
Ch visited briefly while rounding in 2H; pt. sitting up in chair awake and agreeable to visit, w/dtr. at window chair.  Pt. says she hopes to be transferred to a stepdown unit tomorrow.  No needs expressed at this time.  Chaplains remain available as needed.

## 2021-01-06 NOTE — Progress Notes (Signed)
Peripherally Inserted Central Catheter Placement  The IV Nurse has discussed with the patient and/or persons authorized to consent for the patient, the purpose of this procedure and the potential benefits and risks involved with this procedure.  The benefits include less needle sticks, lab draws from the catheter, and the patient may be discharged home with the catheter. Risks include, but not limited to, infection, bleeding, blood clot (thrombus formation), and puncture of an artery; nerve damage and irregular heartbeat and possibility to perform a PICC exchange if needed/ordered by physician.  Alternatives to this procedure were also discussed.  Bard Power PICC patient education guide, fact sheet on infection prevention and patient information card has been provided to patient /or left at bedside.    PICC Placement Documentation  PICC Double Lumen 01/06/21 PICC Right Basilic 35 cm 0 cm (Active)  Indication for Insertion or Continuance of Line Vasoactive infusions 01/06/21 1256  Exposed Catheter (cm) 0 cm 01/06/21 1256  Site Assessment Clean;Dry;Intact 01/06/21 1256  Lumen #1 Status Flushed;Saline locked;Blood return noted 01/06/21 1256  Lumen #2 Status Blood return noted;Flushed;Saline locked 01/06/21 1256  Dressing Type Transparent;Securing device 01/06/21 1256  Dressing Status Clean;Dry;Intact 01/06/21 1256  Antimicrobial disc in place? Yes 01/06/21 1256  Safety Lock Not Applicable 01/06/21 1256  Dressing Change Due 01/13/21 01/06/21 1256       Romie Jumper 01/06/2021, 12:58 PM

## 2021-01-06 NOTE — TOC CM/SW Note (Signed)
HF TOC CM reviewed chart and IP Rehab following for possible dc to CIR when medically stable. Will continue to follow for dc needs.   Isidoro Donning RN3 CCM, Heart Failure TOC CM [phone]

## 2021-01-06 NOTE — Progress Notes (Signed)
Physical Therapy Treatment Patient Details Name: Jane Navarro MRN: [id number] DOB: 06/25/1945 Today's Date: 01/06/2021   History of Present Illness Pt is a 75 y.o. female admitted 12/29/20 with NSTEMI, cardiogenic shock. [facility] showed severe multivessel disease. S/p CP impella placed 9/27-10/3. S/p PCI to RCA DES x3 on 9/30; course complicated by hypotension, post-op bleeding in groin. PMH includes anemia, CVA, tobacco use; of note, recent admission 12/05/20 after pt found passed out in car in [employer] parking lot, found to have severe hypothyroidism.    PT Comments    Patient progressing this session to in room ambulation with eva walker and +2 for safety.  Patient needing assist at hips for balance and posture.  She fatigues easily so did not make it all the way around the bed but backed up and walked up toward [facility].  Patient on RA throughout and sats >90%.  She is progressing tolerance and should continue to progress to be able to tolerate CIR level rehab at d/c.  PT to continue to follow.    Recommendations for follow up therapy are one component of a multi-disciplinary discharge planning process, led by the attending physician.  Recommendations may be updated based on patient status, additional functional criteria and insurance authorization.  Follow Up Recommendations  CIR;Supervision for mobility/OOB     Equipment Recommendations  None recommended by PT    Recommendations for Other Services       Precautions / Restrictions Precautions Precautions: Fall Precaution Comments: Multiple lines, bilateral groin wounds Restrictions RLE Weight Bearing: Weight bearing as tolerated LLE Weight Bearing: Weight bearing as tolerated     Mobility  Bed Mobility Overal bed mobility: Needs Assistance Bed Mobility: Sit to Supine       Sit to supine: Mod assist   General bed mobility comments: assist for legs into bed and for lines    Transfers Overall transfer level: Needs  assistance Equipment used: Bilateral platform walker;4-wheeled walker (eva walker) Transfers: Sit to/from Stand Sit to Stand: Mod assist;+2 safety/equipment         General transfer comment: assist to stand from recliner to eva walker, cues for hand placement and some lifting help  Ambulation/Gait Ambulation/Gait assistance: Mod assist;+2 safety/equipment Gait Distance (Feet): 12 Feet Assistive device: 4-wheeled walker;Bilateral platform walker Gait Pattern/deviations: Step-to pattern;Decreased stride length;Shuffle;Wide base of support     General Gait Details: assist for safety holding behind hips, fatigued quickly so went around foot of the bed then backed up and turned to walk up to [facility], +2 for lines   Stairs             Wheelchair Mobility    Modified Rankin (Stroke Patients Only)       Balance Overall balance assessment: Needs assistance Sitting-balance support: Feet supported Sitting balance-Jane Navarro: Fair     Standing balance support: Bilateral upper extremity supported Standing balance-Jane Navarro: Poor Standing balance comment: UE support and assist for balance                            Cognition Arousal/Alertness: Awake/alert Behavior During Therapy: WFL for tasks assessed/performed Overall Cognitive Status: Within Functional Limits for tasks assessed                                        Exercises      General Comments General comments (skin integrity,  edema, etc.): Daughter present and supportive VSS throughout session on RA      Pertinent Vitals/Pain Pain Assessment: Faces Faces Pain Navarro: Hurts a little bit Pain Location: Bilateral knees (L>R) Pain Descriptors / Indicators: Discomfort;Grimacing Pain Intervention(s): Monitored during session    Home Living                      Prior Function            PT Goals (current goals can now be found in the care plan section) Progress towards PT  goals: Progressing toward goals    Frequency    Min 3X/week      PT Plan Current plan remains appropriate    Co-evaluation              AM-PAC PT "6 Clicks" Mobility   Outcome Measure  Help needed turning from your back to your side while in a flat bed without using bedrails?: A Lot Help needed moving from lying on your back to sitting on the side of a flat bed without using bedrails?: A Lot Help needed moving to and from a bed to a chair (including a wheelchair)?: A Lot Help needed standing up from a chair using your arms (e.g., wheelchair or bedside chair)?: A Lot Help needed to walk in hospital room?: A Lot Help needed climbing 3-5 steps with a railing? : Total 6 Click Score: 11    End of Session Equipment Utilized During Treatment: Gait belt Activity Tolerance: Patient limited by fatigue Patient left: in bed;with call bell/phone within reach;with family/visitor present   PT Visit Diagnosis: Other abnormalities of gait and mobility (R26.89);Muscle weakness (generalized) (M62.81)     Time: 3143-8887 PT Time Calculation (min) (ACUTE ONLY): 26 min  Charges:  $Gait Training: 8-22 mins $Therapeutic Activity: 8-22 mins                     Jane Navarro, PT Acute Rehabilitation Services Pager:[phone] [phone] 01/06/2021    Jane Navarro 01/06/2021, 1:11 PM

## 2021-01-06 NOTE — PMR Pre-admission (Signed)
PMR Admission Coordinator Pre-Admission Assessment  Patient: Jane Navarro is an 75 y.o., female MRN: [id number] DOB: 1945/04/20 Height: 5' (152.4 cm) Weight: 68.4 kg  Insurance Information HMO: yes    PPO:      PCP:      IPA:      80/20:      OTHER:  PRIMARY: [employer]      Policy#: [id number]      Subscriber: pt CM Name: Dot      Phone#: [phone]     Fax#: [phone] Pre-Cert#: [id number] auth for CIR given by Dot with [employer] with updates due to Pat E on day 7 (10/17) at fax listed above.  Pat's extension is [phone] at phone above.       Employer:  Benefits:  Phone #: [phone]     Name:  Eff. Date: 04/05/19     Deduct: $0      Out of Pocket Max: $3900 (met $2347.15)      Life Max: n/a CIR: $295/day for days 1-6      SNF: 20 full days Outpatient:      Co-Pay: $10-$20/visit Home Health: 100%      Co-Pay:  DME: 80%     Co-Ins: 20% Providers:  SECONDARY:       Policy#:      Phone#:   Development worker, community:       Phone#:   The Therapist, art Information Summary" for patients in Inpatient Rehabilitation Facilities with attached "Privacy Act [place] Records" was provided and verbally reviewed with: Family  Emergency Contact Information Contact Information     Name Relation Home Work Chester Son [phone]     Sheran Spine Daughter   [phone]       Current Medical History  Patient Admitting Diagnosis: debility following multiple medical problems including NSTEMI (9/26) and CVA (9/7)  History of Present Illness: pt is a 75 y/o female admitted to [facility] on 9/26 with c/o weakness.  Previously admitted in early September for a CVA.  PMH significant for hypothyroidism, HTN, CKD stage IIIa, and remote history of tobacco use.  In ED RR ranged 16-36, soft BPs with systolic 65-465, O2 on room air at 90%.  EKG showed new left BBB.  Labs from 9/26 significant for WBC 13.6, hemoglobin 8.7 with MCV 101, alkaline phosphatase 279, AST 2238,  ALT 1270, total bilirubin 1.9, BNP >4500, and high-sensitivity troponin 7477->7263.  Chest x-ray noted blunting of the bilateral costophrenic angles with likely trace pleural effusions. Urinalysis was positive for small leukocytes, positive nitrites, many bacteria, and 6-10 WBCs.  Cardiology consulted and note labs concerning for cardiogenic shock, recommend stat echo which shows newly reduced LV and RV function, focal wall motion abnormalities, and moderate MR/TR.  She underwent urgent L/R heart cath on 9/27 and became hypotensive/acidotic.  She was given bicarb x2, started on milrinone, and had impella placed. She was deemed a poor candidate for surgery, so underwent staged multivessel PCI.   Subsequently she developed hypotension in the ICU with evidence of bleed at the site and so Critical Care was consulted.  Recommended initiation of vasopressors and PRBCs, continue milrinone and following labs.  Impella was removed on 10/3.  Required brief use of levophed again overnight on 10/4.  Therapy evaluations were completed and pt was recommended for CIR.     Patient's medical record from [facility] has been reviewed by the rehabilitation admission coordinator and physician.  Past Medical History  Past Medical History:  Diagnosis Date   Anemia    CVA (cerebral vascular accident) ([place])    HLD (hyperlipidemia)    Hypothyroidism    Tobacco abuse, in remission     Has the patient had major surgery during 100 days prior to admission? Yes  Family History   family history includes Breast cancer in her mother; Dementia in her mother; Parkinson's disease in her father; Transient ischemic attack in her mother.  Current Medications  Current Facility-Administered Medications:    acetaminophen (TYLENOL) tablet 650 mg, 650 mg, Oral, Q4H PRN, Clegg, Amy D, NP, 650 mg at 01/09/21 2100   aspirin EC tablet 81 mg, 81 mg, Oral, Daily, Clegg, Amy D, NP, 81 mg at 01/11/21 0946   atorvastatin (LIPITOR) tablet 80 mg,  80 mg, Oral, Daily, Clegg, Amy D, NP, 80 mg at 01/11/21 0949   chlorhexidine (PERIDEX) 0.12 % solution 15 mL, 15 mL, Mouth Rinse, BID, Clegg, Amy D, NP, 15 mL at 01/11/21 0946   Chlorhexidine Gluconate Cloth 2 % PADS 6 each, 6 each, Topical, Daily, Clegg, Amy D, NP, 6 each at 01/11/21 0951   digoxin (LANOXIN) tablet 0.125 mg, 0.125 mg, Oral, Daily, Clegg, Amy D, NP, 0.125 mg at 01/11/21 0945   docusate sodium (COLACE) capsule 200 mg, 200 mg, Oral, Daily, Clegg, Amy D, NP, 200 mg at 01/11/21 0943   enoxaparin (LOVENOX) injection 40 mg, 40 mg, Subcutaneous, Q24H, Clegg, Amy D, NP, 40 mg at 01/11/21 0948   ezetimibe (ZETIA) tablet 10 mg, 10 mg, Oral, Daily, Clegg, Amy D, NP, 10 mg at 01/11/21 0944   feeding supplement (ENSURE ENLIVE / ENSURE PLUS) liquid 237 mL, 237 mL, Oral, BID BM, Clegg, Amy D, NP, 237 mL at 01/10/21 0955   fentaNYL (SUBLIMAZE) injection 25 mcg, 25 mcg, Intravenous, Q1H PRN, Clegg, Amy D, NP, 25 mcg at 01/01/21 2053   levothyroxine (SYNTHROID) tablet 100 mcg, 100 mcg, Oral, Q0600, Clegg, Amy D, NP, 100 mcg at 01/11/21 0648   lip balm (CARMEX) ointment, , Topical, PRN, Clegg, Amy D, NP, 75 application at 16/10/96 0953   losartan (COZAAR) tablet 12.5 mg, 12.5 mg, Oral, Daily, Clegg, Amy D, NP, 12.5 mg at 01/11/21 0945   magnesium hydroxide (MILK OF MAGNESIA) suspension 30 mL, 30 mL, Oral, Daily, Bensimhon, Shaune Pascal, MD, 30 mL at 01/11/21 0955   MEDLINE mouth rinse, 15 mL, Mouth Rinse, q12n4p, Clegg, Amy D, NP, 15 mL at 01/10/21 1150   ondansetron (ZOFRAN) injection 4 mg, 4 mg, Intravenous, Q6H PRN, Clegg, Amy D, NP   oxyCODONE-acetaminophen (PERCOCET/ROXICET) 5-325 MG per tablet 1 tablet, 1 tablet, Oral, Q6H PRN, Clegg, Amy D, NP, 1 tablet at 01/07/21 1753   polyethylene glycol (MIRALAX / GLYCOLAX) packet 17 g, 17 g, Oral, Daily, Clegg, Amy D, NP, 17 g at 01/11/21 0947   senna (SENOKOT) tablet 17.2 mg, 2 tablet, Oral, Daily, Clegg, Amy D, NP, 17.2 mg at 01/11/21 0945   sodium  chloride (OCEAN) 0.65 % nasal spray 1 spray, 1 spray, Each Nare, PRN, Clegg, Amy D, NP   sodium chloride flush (NS) 0.9 % injection 10-40 mL, 10-40 mL, Intracatheter, Q12H, Clegg, Amy D, NP, 10 mL at 01/11/21 0948   sodium chloride flush (NS) 0.9 % injection 10-40 mL, 10-40 mL, Intracatheter, PRN, Clegg, Amy D, NP   spironolactone (ALDACTONE) tablet 25 mg, 25 mg, Oral, Daily, Clegg, Amy D, NP, 25 mg at 01/11/21 0944   thiamine tablet 100 mg, 100 mg, Oral, Daily, Clegg, Amy D, NP, 100  mg at 01/11/21 0944   [COMPLETED] ticagrelor (BRILINTA) tablet 180 mg, 180 mg, Oral, Once, 180 mg at 12/31/20 1003 **FOLLOWED BY** ticagrelor (BRILINTA) tablet 90 mg, 90 mg, Oral, BID, Clegg, Amy D, NP, 90 mg at 01/11/21 1443  Patients Current Diet:  Diet Order             Diet Heart Room service appropriate? Yes; Fluid consistency: Thin  Diet effective now                   Precautions / Restrictions Precautions Precautions: Fall Precaution Comments: Multiple lines, bilateral groin wounds Other Brace: endorses baseline vertigo, pt wears neoprene type sleeve on B knees due to arthritic pain Restrictions Weight Bearing Restrictions: No RLE Weight Bearing: Weight bearing as tolerated LLE Weight Bearing: Weight bearing as tolerated Other Position/Activity Restrictions: be aware of L knee pain with bearing weight   Has the patient had 2 or more falls or a fall with injury in the past year? Yes  Prior Activity Level Limited Community (1-2x/wk): independent with mobility prior to previous admit in early September, mod I with RW,  Prior Functional Level Self Care: Did the patient need help bathing, dressing, using the toilet or eating? Independent  Indoor Mobility: Did the patient need assistance with walking from room to room (with or without device)? Independent  Stairs: Did the patient need assistance with internal or external stairs (with or without device)? Independent  Functional Cognition: Did  the patient need help planning regular tasks such as shopping or remembering to take medications? Independent  Patient Information Are you of Hispanic, Latino/a,or Spanish origin?: A. No, not of Hispanic, Latino/a, or Spanish origin What is your race?: A. White Do you need or want an interpreter to communicate with a doctor or health care staff?: 0. No  Patient's Response To:  Health Literacy and Transportation Is the patient able to respond to health literacy and transportation needs?: Yes Health Literacy - How often do you need to have someone help you when you read instructions, pamphlets, or other written material from your doctor or pharmacy?: Sometimes In the past 12 months, has lack of transportation kept you from medical appointments or from getting medications?: No In the past 12 months, has lack of transportation kept you from meetings, work, or from getting things needed for daily living?: No  Home Assistive Devices / [place]wey Devices/Equipment: Environmental consultant (specify type) Home Equipment: Walker - 2 wheels, Toilet riser, Transport chair  Prior Device Use: Indicate devices/aids used by the patient prior to current illness, exacerbation or injury? Walker  Current Functional Level Cognition  Overall Cognitive Status: Within Functional Limits for tasks assessed Orientation Level: Oriented X4 General Comments: pt following commands appropriately, seems slightly more fearful of movement/activity at this session    Extremity Assessment (includes Sensation/Coordination)  Upper Extremity Assessment: Generalized weakness  Lower Extremity Assessment: Defer to PT evaluation    ADLs  Overall ADL's : Needs assistance/impaired Eating/Feeding: Set up, Sitting Grooming: Wash/dry face, Min guard, Sitting Grooming Details (indicate cue type and reason): EOB Upper Body Bathing: Sitting, Minimal assistance Lower Body Bathing: Sit to/from stand, Moderate assistance Lower Body  Bathing Details (indicate cue type and reason): knees down Upper Body Dressing : Minimal assistance, Sitting Upper Body Dressing Details (indicate cue type and reason): to don extra gown like robe Lower Body Dressing: Maximal assistance, Sitting/lateral leans Lower Body Dressing Details (indicate cue type and reason): to don socks Toilet Transfer: Minimal assistance, +2 for  physical assistance, +2 for safety/equipment (EVA walker) Toilet Transfer Details (indicate cue type and reason): short pivot ambulation, fatigues quickly Toileting- Clothing Manipulation and Hygiene: Maximal assistance, Sit to/from stand Functional mobility during ADLs: Minimal assistance, +2 for safety/equipment (EVA walker) General ADL Comments: demonstrating increased activity tolerance and continued motivation    Mobility  Overal bed mobility: Needs Assistance Bed Mobility: Rolling, Sidelying to Sit, Sit to Sidelying Rolling: Min assist Sidelying to sit: Min assist Supine to sit: Min assist, +2 for safety/equipment, HOB elevated Sit to supine: Mod assist Sit to sidelying: Mod assist General bed mobility comments: minA to complete rolling in either direction or to elevate trunk from [facility] modA to bring BLE back into bed    Transfers  Overall transfer level: Needs assistance Equipment used: Rolling walker (2 wheeled) Transfers: Sit to/from Stand, Stand Pivot Transfers Sit to Stand: Min assist, +2 physical assistance, +2 safety/equipment Stand pivot transfers: Min assist, +2 safety/equipment, +2 physical assistance General transfer comment: minA to power up to standing, pt more confident with +2, cues for wt through toes and anterior wt shift. pt leaning backwards and needing modA to maintain upright until wt shift forward. minA to complete pivoting steps until pt fatigued, then increased urgency and decreased command following    Ambulation / Gait / Stairs / Wheelchair Mobility  Ambulation/Gait Ambulation/Gait  assistance: Min assist, +2 physical assistance, +2 safety/equipment (chair follow) Gait Distance (Feet): 3 Feet (+ 4 ft) Assistive device: Rolling walker (2 wheeled) Gait Pattern/deviations: Step-to pattern, Decreased stride length, Shuffle, Trunk flexed, Narrow base of support General Gait Details: small forward steps with pt needing modA to maintain posture (posterior lean). LOB backwards with any decrease in posterior support Gait velocity: decreased Gait velocity interpretation: <1.31 ft/sec, indicative of household ambulator    Posture / Balance Dynamic Sitting Balance Sitting balance - Comments: minG sitting at EOB Balance Overall balance assessment: Needs assistance Sitting-balance support: Feet supported Sitting balance-Leahy Scale: Fair Sitting balance - Comments: minG sitting at EOB Postural control: Posterior lean Standing balance support: Bilateral upper extremity supported Standing balance-Leahy Scale: Poor Standing balance comment: UE support and assist for balance    Special needs/care consideration Skin dehiscence of R groin wound.    Previous Home Environment (from acute therapy documentation) Living Arrangements: Children Available Help at Discharge: Family, Available PRN/intermittently Type of Home: House Home Layout: One level Home Access: Stairs to enter (son can build ramp) Entrance Stairs-Rails: None Technical brewer of Steps: 3-4 Bathroom Shower/Tub: Chiropodist: Standard Home Care Services: No ("trying to get some though") Additional Comments: Likely plan to d/c to son's home as family does not want pt living alone  Discharge Living Setting Plans for Discharge Living Setting: Lives with (comment) (son, Gaspar Bidding) Type of Home at Discharge: House Discharge Home Layout: One level Discharge Home Access: Stairs to enter (can build ramp) Entrance Stairs-Rails: None Entrance Stairs-Number of Steps: 3-4 Discharge Bathroom Shower/Tub:  Tub/shower unit Discharge Bathroom Toilet: Standard Discharge Bathroom Accessibility: Yes How Accessible: Accessible via walker Does the patient have any problems obtaining your medications?: No  Social/Family/Support Systems Anticipated Caregiver: Lattie Haw and Gaspar Bidding (pt's daughter and son) Anticipated Caregiver's Contact Information: Lattie Haw [phone], Alitzel Cookson [phone] Ability/Limitations of Caregiver: n/a Caregiver Availability: 24/7 Discharge Plan Discussed with Primary Caregiver: Yes Is Caregiver In Agreement with Plan?: Yes Does Caregiver/Family have Issues with Lodging/Transportation while Pt is in Rehab?: No  Goals Patient/Family Goal for Rehab: Pt/OT supervision to mod I, SLP n/a Expected length of stay: 10-14  days Pt/Family Agrees to Admission and willing to participate: Yes Program Orientation Provided & Reviewed with Pt/Caregiver Including Roles  & Responsibilities: Yes  Decrease burden of Care through IP rehab admission: n/a  Possible need for SNF placement upon discharge: not anticipated  Patient Condition: I have reviewed medical records from [facility], spoken with CM, and patient and daughter. I met with patient at the bedside and discussed via phone for inpatient rehabilitation assessment.  Patient will benefit from ongoing PT and OT, can actively participate in 3 hours of therapy a day 5 days of the week, and can make measurable gains during the admission.  Patient will also benefit from the coordinated team approach during an Inpatient Acute Rehabilitation admission.  The patient will receive intensive therapy as well as Rehabilitation physician, nursing, social worker, and care management interventions.  Due to safety, skin/wound care, disease management, medication administration, pain management, and patient education the patient requires 24 hour a day rehabilitation nursing.  The patient is currently min to mod assist with mobility and basic ADLs.  Discharge  setting and therapy post discharge at home with home health is anticipated.  Patient has agreed to participate in the Acute Inpatient Rehabilitation Program and will admit today.  Preadmission Screen Completed By:  Michel Santee, PT, DPT 01/11/2021 10:39 AM ______________________________________________________________________   Discussed status with Dr. Ranell Patrick on .today at 10:39 AM and received approval for admission today.  Admission Coordinator:  Michel Santee, PT, DPT time 10:39 AM Sudie Grumbling 01/11/21    Assessment/Plan: Diagnosis: Cardiopulmonary debility 2/2 NSTEMI Does the need for close, 24 hr/day Medical supervision in concert with the patient's rehab needs make it unreasonable for this patient to be served in a less intensive setting? Yes Co-Morbidities requiring supervision/potential complications: acute metabolic encephalopathy, transaminitis, leukocytosis, macrocytic anemia, AKI, UTI Due to bladder management, bowel management, safety, skin/wound care, disease management, medication administration, pain management, and patient education, does the patient require 24 hr/day rehab nursing? Yes Does the patient require coordinated care of a physician, rehab nurse, PT, OT, and SLP to address physical and functional deficits in the context of the above medical diagnosis(es)? Yes Addressing deficits in the following areas: balance, endurance, locomotion, strength, transferring, bowel/bladder control, bathing, dressing, feeding, grooming, toileting, and psychosocial support Can the patient actively participate in an intensive therapy program of at least 3 hrs of therapy 5 days a week? Yes The potential for patient to make measurable gains while on inpatient rehab is excellent Anticipated functional outcomes upon discharge from inpatient rehab: modified independent and supervision PT, modified independent OT, independent SLP Estimated rehab length of stay to reach the above functional goals  is: 10-14 days Anticipated discharge destination: Home 10. Overall Rehab/Functional Prognosis: excellent   MD Signature: Leeroy Cha, MD

## 2021-01-06 NOTE — Progress Notes (Signed)
Inpatient Rehab Admissions Coordinator:   Met with patient at the bedside and spoke to her daughter, Lattie Haw, on the phone to discuss CIR recommendations and goals/expectations of potential admit. I reviewed 3 hrs/day of therapy and ELOS 2 weeks, dependent on progress.  Plan at discharge from [place] would be home with her son who can provide supervision.  I let them know that I would need insurance authorization from [facility]; I'm not sure if they will approve, but I will try.  Will open today with insurance and continue to follow.   Shann Medal, PT, DPT Admissions Coordinator [phone] 01/06/21  2:52 PM

## 2021-01-06 NOTE — Progress Notes (Signed)
Patient ID: Jane Navarro, female   DOB: 01-Nov-1945, 75 y.o.   MRN: [id number]     Advanced Heart Failure Rounding Note  PCP-Cardiologist: Parke Poisson, MD   Subjective:    - 9/27: NSTEMI-->Cardiogenic shock. Impella CP placed and started on milrinone. Given 2 UPRBCs. Milrinone increased to 0.375 mcg.  - 9/28: Continued on milrinone 0.375 mcg + impella P8. Started on losartan and spiro.  - 9/30:  PCI to RCA overlapping DES x 3, deferred intervention on left system.  Groin site bleeding, femstop applied and 2 units PRBCs given.  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  - 10/3: Impella extracted   [place]wan out yesterday. Developed hypotension. Milrinone decreased to 0.125. NE restarted but stopped overnight. BCx drawn. Co-ox 56%  SBP 100-110.   Feels ok. Able to sit up in chair. Had good BM    Objective:   Weight Range: 61 kg Body mass index is 26.26 kg/m.   Vital Signs:   Temp:  [97.7 F (36.5 C)-99 F (37.2 C)] 97.9 F (36.6 C) (10/05 0700) Pulse Rate:  [69-101] 80 (10/05 0800) Resp:  [13-28] 20 (10/05 0800) BP: (75-139)/(58-88) 108/61 (10/05 0800) SpO2:  [79 %-100 %] 100 % (10/05 0800) Arterial Line BP: (82-145)/(45-74) 115/53 (10/05 0800) Weight:  [61 kg] 61 kg (10/05 0500) Last BM Date: 01/06/21  Weight change: Filed Weights   01/02/21 0500 01/03/21 0530 01/06/21 0500  Weight: 59.9 kg 61.2 kg 61 kg    Intake/Output:   Intake/Output Summary (Last 24 hours) at 01/06/2021 0837 Last data filed at 01/06/2021 0701 Gross per 24 hour  Intake 601.94 ml  Output 100 ml  Net 501.94 ml      Physical Exam   General:  Weak appearing. No resp difficulty HEENT: normal poor dentition Neck: supple. no JVD. Carotids 2+ bilat; no bruits. No lymphadenopathy or thryomegaly appreciated. Cor: PMI nondisplaced. Regular rate & rhythm. No rubs, gallops or murmurs. Lungs: clear Abdomen: soft, nontender, nondistended. No hepatosplenomegaly. No bruits or masses. Good  bowel sounds. Extremities: no cyanosis, clubbing, rash, 2+ edema  + groin wounds Neuro: alert & orientedx3, cranial nerves grossly intact. moves all 4 extremities w/o difficulty. Affect pleasant  Telemetry   NSR 80 Personally reviewed  Labs    CBC Recent Labs    01/05/21 0446 01/06/21 0534  WBC 7.6 7.6  HGB 9.3* 9.3*  HCT 28.8* 28.1*  MCV 94.4 93.7  PLT 79* 141*    Basic Metabolic Panel Recent Labs    45/03/88 0446 01/06/21 0534  NA 132* 132*  K 3.7 3.9  CL 106 107  CO2 21* 18*  GLUCOSE 83 93  BUN 11 12  CREATININE 0.57 0.64  CALCIUM 7.8* 8.2*  MG 1.9 2.3    Liver Function Tests Recent Labs    01/05/21 0446 01/06/21 0534  AST 56* 41  ALT 171* 140*  ALKPHOS 83 70  BILITOT 1.4* 1.3*  PROT 4.5* 4.7*  ALBUMIN 1.9* 2.0*    No results for input(s): LIPASE, AMYLASE in the last 72 hours. Cardiac Enzymes No results for input(s): CKTOTAL, CKMB, CKMBINDEX, TROPONINI in the last 72 hours.  BNP: BNP (last 3 results) Recent Labs    12/29/20 0014  BNP >4,500.0*     ProBNP (last 3 results) No results for input(s): PROBNP in the last 8760 hours.   D-Dimer No results for input(s): DDIMER in the last 72 hours. Hemoglobin A1C No results for input(s): HGBA1C in the last 72 hours. Fasting Lipid  Panel Recent Labs    01/05/21 0446  CHOL 96  HDL 22*  LDLCALC 56  TRIG 92  CHOLHDL 4.4    Thyroid Function Tests No results for input(s): TSH, T4TOTAL, T3FREE, THYROIDAB in the last 72 hours.  Invalid input(s): FREET3   Other results:   Imaging    No results found.   Medications:     Scheduled Medications:  aspirin EC  81 mg Oral Daily   atorvastatin  80 mg Oral Daily   chlorhexidine  15 mL Mouth Rinse BID   Chlorhexidine Gluconate Cloth  6 each Topical Daily   digoxin  0.125 mg Oral Daily   docusate sodium  200 mg Oral Daily   enoxaparin (LOVENOX) injection  40 mg Subcutaneous Q24H   ezetimibe  10 mg Oral Daily   feeding supplement  237  mL Oral BID BM   levothyroxine  100 mcg Oral Q0600   mouth rinse  15 mL Mouth Rinse q12n4p   polyethylene glycol  17 g Oral Daily   senna  2 tablet Oral Daily   sodium chloride flush  10-40 mL Intracatheter Q12H   sodium chloride flush  3 mL Intravenous Q12H   spironolactone  12.5 mg Oral Daily   thiamine  100 mg Oral Daily   ticagrelor  90 mg Oral BID    Infusions:  sodium chloride 20 mL/hr at 01/06/21 0800   sodium chloride 10 mL/hr at 01/03/21 0443   milrinone     norepinephrine (LEVOPHED) Adult infusion Stopped (01/05/21 2305)    PRN Medications: Place/Maintain arterial line **AND** sodium chloride, acetaminophen, fentaNYL (SUBLIMAZE) injection, lip balm, ondansetron (ZOFRAN) IV, oxyCODONE-acetaminophen, sodium chloride, sodium chloride flush    Patient Profile   Jane Navarro is a 75 year old with history of htn, hyperlipidemia, CVA, hypothyroidism, and CKD Stage IIIa.   Admitted with chest pain--> NSTEMI -->cardiogenic shock    Assessment/Plan  NSTEMI--->CAD severe multivessel diseease-->Cardiogenic Shock - HS Trop 7893->8101. ECHO EF 25-30% and severely reduced RV function.  - LHC with severe L main with multivessel  proximal LAD and mild RCA .  - In the cath lab PA sat < 40%., CI 1.6.  Placed Impella 2.5 placed and started on milrinone  - Felt too high risk for CABG.  - 9/30 PCI to RCA with DES x 3, deferred intervention on left main with difficulty wiring the LAD and non-flow limiting dissection.  Can consider repeat PCI attempt (versus off-pump LIMA) down the road as she improves - Continue ASA 81, ticagrelor, atorvastatin.   2.  Cardiogenic shock: Ischemic cardiomyopathy.  Echo with EF 25-30%, severely decreased RV function, moderate-severe TR, moderate MR. Required MCS w/ Impella. Impella removed 10/3. Remains on Milrinone 0.125. Co-ox stable at 56%   - Keep milrinone at 0.125 today  - Continue digoxin 0.125 mcg  - Increase spiro 25 mg  - Losartan held due to low  BP - Avoiding SGLT2i due to groin infections. Consider starting soon - Renal function stable.  - Weight stable. CVP 6 but very edematous. Keep TED hose on. Consider low-dose lasix  - Place PICC. Remove introducer  2. Shock Liver  - LFTs now trending down.    3. Anemia  - Hgb dropped to 6.5 initially>> given 2UPRBCS with appropriate rise to 8.9.  - 1U RBCs given 9/29, Hgb 7.6>>9.1 - Significant hematoma left groin post-intervention, had 2 units PRBCs 9/30 with appropriate increase in hgb. IABP removed 10/3. Site remains stable  - Hgb currently 9.3,  stable.    4. Hypothyroidism  -TSH 4.8  -Continue levothyroxine.    5. UTI  - Urine culture NGTD - Completed course of rocephin    6. H/O CVA  7. AKI:  - resolved - SCr 0.57 today   8. 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 Platelets hit a nadir at 33K.  Preliminary HIT negative, SRA pending.   -plts trending up, 33>>79> 141  9. Constipation  - c/w bowel regimen - add sorbitol    Arvilla Meres, MD 01/06/2021 8:37 AM

## 2021-01-07 DIAGNOSIS — I214 Non-ST elevation (NSTEMI) myocardial infarction: Secondary | ICD-10-CM | POA: Diagnosis not present

## 2021-01-07 LAB — CBC
HCT: 27.8 % — ABNORMAL LOW (ref 36.0–46.0)
Hemoglobin: 9.1 g/dL — ABNORMAL LOW (ref 12.0–15.0)
MCH: 30.3 pg (ref 26.0–34.0)
MCHC: 32.7 g/dL (ref 30.0–36.0)
MCV: 92.7 fL (ref 80.0–100.0)
Platelets: 200 10*3/uL (ref 150–400)
RBC: 3 MIL/uL — ABNORMAL LOW (ref 3.87–5.11)
RDW: 15.6 % — ABNORMAL HIGH (ref 11.5–15.5)
WBC: 6.3 10*3/uL (ref 4.0–10.5)
nRBC: 0 % (ref 0.0–0.2)

## 2021-01-07 LAB — COOXEMETRY PANEL
Carboxyhemoglobin: 2.5 % — ABNORMAL HIGH (ref 0.5–1.5)
Carboxyhemoglobin: 2.1 % — ABNORMAL HIGH (ref 0.5–1.5)
Methemoglobin: 1.1 % (ref 0.0–1.5)
Methemoglobin: 0.7 % (ref 0.0–1.5)
O2 Saturation: 62 %
O2 Saturation: 58.9 %
Total hemoglobin: 9.2 g/dL — ABNORMAL LOW (ref 12.0–16.0)
Total hemoglobin: 10.2 g/dL — ABNORMAL LOW (ref 12.0–16.0)

## 2021-01-07 LAB — COMPREHENSIVE METABOLIC PANEL
ALT: 107 U/L — ABNORMAL HIGH (ref 0–44)
AST: 29 U/L (ref 15–41)
Albumin: 1.8 g/dL — ABNORMAL LOW (ref 3.5–5.0)
Alkaline Phosphatase: 82 U/L (ref 38–126)
Anion gap: 10 (ref 5–15)
BUN: 10 mg/dL (ref 8–23)
CO2: 23 mmol/L (ref 22–32)
Calcium: 7.8 mg/dL — ABNORMAL LOW (ref 8.9–10.3)
Chloride: 102 mmol/L (ref 98–111)
Creatinine, Ser: 0.66 mg/dL (ref 0.44–1.00)
GFR, Estimated: >60 mL/min (ref >60)
Glucose, Bld: 97 mg/dL (ref 70–99)
Potassium: 3.3 mmol/L — ABNORMAL LOW (ref 3.5–5.1)
Sodium: 135 mmol/L (ref 135–145)
Total Bilirubin: 1.4 mg/dL — ABNORMAL HIGH (ref 0.3–1.2)
Total Protein: 4.5 g/dL — ABNORMAL LOW (ref 6.5–8.1)

## 2021-01-07 LAB — MAGNESIUM: Magnesium: 1.9 mg/dL (ref 1.7–2.4)

## 2021-01-07 MED ORDER — POTASSIUM CHLORIDE CRYS ER 20 MEQ PO TBCR
40.0000 meq | EXTENDED_RELEASE_TABLET | Freq: Once | ORAL | Status: AC
  Administered 2021-01-07: 40 meq via ORAL
  Filled 2021-01-07: qty 2

## 2021-01-07 MED ORDER — FUROSEMIDE 40 MG PO TABS
40.0000 mg | ORAL_TABLET | Freq: Every day | ORAL | Status: DC
  Administered 2021-01-07: 40 mg via ORAL
  Filled 2021-01-07: qty 1

## 2021-01-07 MED ORDER — LOSARTAN POTASSIUM 25 MG PO TABS
12.5000 mg | ORAL_TABLET | Freq: Every day | ORAL | Status: DC
  Administered 2021-01-07 – 2021-01-11 (×4): 12.5 mg via ORAL
  Filled 2021-01-07 (×5): qty 1

## 2021-01-07 MED ORDER — POTASSIUM CHLORIDE CRYS ER 20 MEQ PO TBCR
20.0000 meq | EXTENDED_RELEASE_TABLET | Freq: Every day | ORAL | Status: DC
  Administered 2021-01-08 – 2021-01-10 (×3): 20 meq via ORAL
  Filled 2021-01-07 (×3): qty 1

## 2021-01-07 NOTE — Progress Notes (Signed)
    Had vagal episode while standing and briefly lost consciousness.   Placed back in bed and with regained consciousness. A & O x3.   SBP 120s. CVP 4-5.   Check CO-OX.   Cancel transfer and monitor.   Catherina Pates NP-C  5:40 PM

## 2021-01-07 NOTE — Progress Notes (Addendum)
Patient ID: Jane Navarro, female   DOB: 1946/03/25, 74 y.o.   MRN: [id number]     Advanced Heart Failure Rounding Note  PCP-Cardiologist: Parke Poisson, MD   Subjective:    - 9/27: NSTEMI-->Cardiogenic shock. Impella CP placed and started on milrinone. Given 2 UPRBCs. Milrinone increased to 0.375 mcg.  - 9/28: Continued on milrinone 0.375 mcg + impella P8. Started on losartan and spiro.  - 9/30:  PCI to RCA overlapping DES x 3, deferred intervention on left system.  Groin site bleeding, femstop applied and 2 units PRBCs given.  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  - 10/3: Impella extracted   PICC placed yesterday. Remains on milrinone 0.125. Still weak. No CP, orthopnea or PND. + DOE  Co-ox 62% on milrinone 0.125    Objective:   Weight Range: 68 kg Body mass index is 29.28 kg/m.   Vital Signs:   Temp:  [97.7 F (36.5 C)-98.9 F (37.2 C)] 97.7 F (36.5 C) (10/06 0700) Pulse Rate:  [69-85] 69 (10/06 0600) Resp:  [15-25] 15 (10/06 0600) BP: (93-130)/(61-84) 96/69 (10/06 0600) SpO2:  [96 %-100 %] 98 % (10/06 0600) Arterial Line BP: (107-150)/(52-75) 117/58 (10/06 0600) Weight:  [68 kg] 68 kg (10/06 0600) Last BM Date: 01/06/21  Weight change: Filed Weights   01/03/21 0530 01/06/21 0500 01/07/21 0600  Weight: 61.2 kg 61 kg 68 kg    Intake/Output:   Intake/Output Summary (Last 24 hours) at 01/07/2021 0928 Last data filed at 01/07/2021 0700 Gross per 24 hour  Intake 311.14 ml  Output 3600 ml  Net -3288.86 ml      Physical Exam   General:  Elderly. Sitting in chair No resp difficulty HEENT: normal Neck: supple. no JVD. Carotids 2+ bilat; no bruits. No lymphadenopathy or thryomegaly appreciated. Cor: PMI nondisplaced. Regular rate & rhythm. No rubs, gallops or murmurs. Lungs: clear Abdomen: soft, nontender, nondistended. No hepatosplenomegaly. No bruits or masses. Good bowel sounds. Extremities: no cyanosis, clubbing, rash, 1-2+  edema Neuro: alert & orientedx3, cranial nerves grossly intact. moves all 4 extremities w/o difficulty. Affect pleasant  Telemetry   NSR 60-70 Personally reviewed  Labs    CBC Recent Labs    01/06/21 0534 01/07/21 0520  WBC 7.6 6.3  HGB 9.3* 9.1*  HCT 28.1* 27.8*  MCV 93.7 92.7  PLT 141* 200    Basic Metabolic Panel Recent Labs    23/76/28 2023 01/07/21 0520  NA 133* 135  K 3.3* 3.3*  CL 103 102  CO2 22 23  GLUCOSE 93 97  BUN 10 10  CREATININE 0.80 0.66  CALCIUM 8.3* 7.8*  MG 2.0 1.9    Liver Function Tests Recent Labs    01/06/21 0534 01/07/21 0520  AST 41 29  ALT 140* 107*  ALKPHOS 70 82  BILITOT 1.3* 1.4*  PROT 4.7* 4.5*  ALBUMIN 2.0* 1.8*    No results for input(s): LIPASE, AMYLASE in the last 72 hours. Cardiac Enzymes No results for input(s): CKTOTAL, CKMB, CKMBINDEX, TROPONINI in the last 72 hours.  BNP: BNP (last 3 results) Recent Labs    12/29/20 0014  BNP >4,500.0*     ProBNP (last 3 results) No results for input(s): PROBNP in the last 8760 hours.   D-Dimer No results for input(s): DDIMER in the last 72 hours. Hemoglobin A1C No results for input(s): HGBA1C in the last 72 hours. Fasting Lipid Panel Recent Labs    01/05/21 0446  CHOL 96  HDL 22*  LDLCALC  56  TRIG 92  CHOLHDL 4.4    Thyroid Function Tests No results for input(s): TSH, T4TOTAL, T3FREE, THYROIDAB in the last 72 hours.  Invalid input(s): FREET3   Other results:   Imaging    [place] EKG SITE RITE  Result Date: 01/06/2021 If Site Rite image not attached, placement could not be confirmed due to current cardiac rhythm.    Medications:     Scheduled Medications:  aspirin EC  81 mg Oral Daily   atorvastatin  80 mg Oral Daily   chlorhexidine  15 mL Mouth Rinse BID   Chlorhexidine Gluconate Cloth  6 each Topical Daily   digoxin  0.125 mg Oral Daily   docusate sodium  200 mg Oral Daily   enoxaparin (LOVENOX) injection  40 mg Subcutaneous Q24H    ezetimibe  10 mg Oral Daily   feeding supplement  237 mL Oral BID BM   levothyroxine  100 mcg Oral Q0600   mouth rinse  15 mL Mouth Rinse q12n4p   polyethylene glycol  17 g Oral Daily   senna  2 tablet Oral Daily   sodium chloride flush  10-40 mL Intracatheter Q12H   sodium chloride flush  10-40 mL Intracatheter Q12H   sodium chloride flush  3 mL Intravenous Q12H   spironolactone  25 mg Oral Daily   thiamine  100 mg Oral Daily   ticagrelor  90 mg Oral BID    Infusions:  sodium chloride Stopped (01/06/21 1757)   sodium chloride 10 mL/hr at 01/03/21 0443   milrinone 0.125 mcg/kg/min (01/07/21 0700)   norepinephrine (LEVOPHED) Adult infusion Stopped (01/05/21 2305)    PRN Medications: Place/Maintain arterial line **AND** sodium chloride, acetaminophen, fentaNYL (SUBLIMAZE) injection, lip balm, ondansetron (ZOFRAN) IV, oxyCODONE-acetaminophen, sodium chloride, sodium chloride flush, sodium chloride flush    Patient Profile   Jane Navarro is a 75 year old with history of htn, hyperlipidemia, CVA, hypothyroidism, and CKD Stage IIIa.   Admitted with chest pain--> NSTEMI -->cardiogenic shock    Assessment/Plan   1. 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 ECHO EF 25-30% and severely reduced RV function.  - LHC with severe L main with multivessel  proximal LAD and mild RCA .  - In the cath lab PA sat < 40%., CI 1.6.  Placed Impella 2.5 placed and started on milrinone  - Felt too high risk for CABG.  - 9/30 PCI to RCA with DES x 3, deferred intervention on left main with difficulty wiring the LAD and non-flow limiting dissection.  Can consider repeat PCI attempt (versus off-pump LIMA) down the road as she improves - Continue ASA 81, ticagrelor, atorvastatin. - No s/s angina.    2.  Cardiogenic shock: Ischemic cardiomyopathy.  Echo with EF 25-30%, severely decreased RV function, moderate-severe TR, moderate MR. Required MCS w/ Impella. Impella  removed 10/3. Remains on Milrinone 0.125. Co-ox stable at 62%   - Stop milrinone to day - Continue digoxin 0.125 mcg  - Continue spiro 25 mg  - Losartan held due to low BP - Avoiding SGLT2i due to groin infections. Consider starting soon - Start lasix 40 daily  - Renal function stable.    2. Shock Liver  - LFTs now trending down.    3. Anemia  - Hgb dropped to 6.5 initially>> given 2UPRBCS with appropriate rise to 8.9.  - 1U RBCs given 9/29, Hgb 7.6>>9.1 - Significant hematoma left groin post-intervention, had 2 units PRBCs 9/30 with appropriate increase in hgb. IABP  removed 10/3. Site remains stable  - Hgb currently 9.1, stable.    4. Hypothyroidism  -TSH 4.8  -Continue levothyroxine.    5. UTI  - Urine culture NGTD - Completed course of rocephin    6. H/O CVA  7. AKI:  - resolved - SCr 0.66 today   8. 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 Platelets hit a nadir at 33K.  Preliminary HIT negative, SRA pending.   -plts trending up, 33>>79> 141-> 200  9. Constipation  - resolved  10. Debility - PT/OT following - Awaiting insurance approval for CIR    Stop milrinone. Start lasix 40 daily. Can go to [facility] today. Continue PT/OT. Supp K and Mg.   Arvilla Meres, MD 01/07/2021 9:28 AM

## 2021-01-07 NOTE — Progress Notes (Signed)
Inpatient Rehab Admissions Coordinator:   Received message from  [facility] requesting a peer to peer. I've let Dr. Gala Romney know. Will continue to follow.    Estill Dooms, PT, DPT Admissions Coordinator [phone] 01/07/21  10:50 AM

## 2021-01-07 NOTE — Progress Notes (Signed)
Physical Therapy Treatment Patient Details Name: Jane Navarro MRN: [id number] DOB: May 24, 1945 Today's Date: 01/07/2021   History of Present Illness Pt is a 75 y.o. female admitted 12/29/20 with NSTEMI, cardiogenic shock. [facility] showed severe multivessel disease. S/p CP impella placed 9/27-10/3. S/p PCI to RCA DES x3 on 9/30; course complicated by hypotension, post-op bleeding in groin. PMH includes anemia, CVA, tobacco use; of note, recent admission 12/05/20 after pt found passed out in car in [employer] parking lot, found to have severe hypothyroidism.    PT Comments    The pt continues to demo good progress with OOB mobility and ambulation distance/endurance this morning. She was able to complete 20 ft ambulation in total with use of EVA walker and modA of 2 to manage balance, chair follow, and movement of walker. The pt needs max cues and physical assist to correct posture and strong posterior lean, but was able to progress distance with encouragement and frequent standing rest breaks. Continue to recommend CIR level therapies at d/c given pt's significant deficits and strong motivation.     Recommendations for follow up therapy are one component of a multi-disciplinary discharge planning process, led by the attending physician.  Recommendations may be updated based on patient status, additional functional criteria and insurance authorization.  Follow Up Recommendations  CIR;Supervision for mobility/OOB     Equipment Recommendations  None recommended by PT    Recommendations for Other Services       Precautions / Restrictions Precautions Precautions: Fall Precaution Comments: Multiple lines, bilateral groin wounds Required Braces or Orthoses: Other Brace (for comfort) Other Brace: endorses baseline vertigo, pt wears neoprene type sleeve on B knees due to arthritic pain Restrictions Weight Bearing Restrictions: No     Mobility  Bed Mobility Overal bed mobility: Needs Assistance Bed  Mobility: Sit to Supine     Supine to sit: Min assist;+2 for safety/equipment;HOB elevated     General bed mobility comments: light assist for legs to come EOB, assist for trunk elevation, and use of pad to bring hips EOB    Transfers Overall transfer level: Needs assistance Equipment used: Bilateral platform walker (EVA walker) Transfers: Sit to/from Stand Sit to Stand: Min assist;+2 physical assistance;+2 safety/equipment;From elevated surface         General transfer comment: min A for lift, vc for safe hand placement and posture with EVA walker  Ambulation/Gait Ambulation/Gait assistance: Mod assist;+2 safety/equipment Gait Distance (Feet): 20 Feet Assistive device: 4-wheeled walker;Bilateral platform walker (eva walker) Gait Pattern/deviations: Step-to pattern;Decreased stride length;Shuffle;Leaning posteriorly Gait velocity: decreased Gait velocity interpretation: <1.31 ft/sec, indicative of household ambulator General Gait Details: posterior lean needing assist to manage walker and maintain forward movement (without assist pt pulling walker backards). max cues for posture and forward momentum.     Balance Overall balance assessment: Needs assistance Sitting-balance support: Feet supported Sitting balance-Leahy Scale: Fair Sitting balance - Comments: EOB and in recliner without support   Standing balance support: Bilateral upper extremity supported Standing balance-Leahy Scale: Poor Standing balance comment: UE support and assist for balance                            Cognition Arousal/Alertness: Awake/alert Behavior During Therapy: WFL for tasks assessed/performed Overall Cognitive Status: Within Functional Limits for tasks assessed  General Comments: pt following commands appropriately      Exercises      General Comments General comments (skin integrity, edema, etc.): VSS on RA throughout session,  daughter present throughout session      Pertinent Vitals/Pain Pain Assessment: Faces Faces Pain Scale: Hurts a little bit Pain Location: Bilateral knees (L>R) Pain Descriptors / Indicators: Discomfort;Grimacing Pain Intervention(s): Monitored during session     PT Goals (current goals can now be found in the care plan section) Acute Rehab PT Goals Patient Stated Goal: Get back home to dog; likely move in with son at d/c PT Goal Formulation: With patient/family Time For Goal Achievement: 01/19/21 Potential to Achieve Goals: Good Progress towards PT goals: Progressing toward goals    Frequency    Min 3X/week      PT Plan Current plan remains appropriate    Co-evaluation PT/OT/SLP Co-Evaluation/Treatment: Yes Reason for Co-Treatment: For patient/therapist safety;To address functional/ADL transfers PT goals addressed during session: Mobility/safety with mobility;Balance;Proper use of DME;Strengthening/ROM OT goals addressed during session: ADL's and self-care;Proper use of Adaptive equipment and DME      AM-PAC PT "6 Clicks" Mobility   Outcome Measure  Help needed turning from your back to your side while in a flat bed without using bedrails?: A Lot Help needed moving from lying on your back to sitting on the side of a flat bed without using bedrails?: A Lot Help needed moving to and from a bed to a chair (including a wheelchair)?: A Little Help needed standing up from a chair using your arms (e.g., wheelchair or bedside chair)?: A Little Help needed to walk in hospital room?: A Lot Help needed climbing 3-5 steps with a railing? : Total 6 Click Score: 13    End of Session Equipment Utilized During Treatment: Gait belt Activity Tolerance: Patient limited by fatigue Patient left: in bed;with call bell/phone within reach;with family/visitor present Nurse Communication: Mobility status PT Visit Diagnosis: Other abnormalities of gait and mobility (R26.89);Muscle weakness  (generalized) (M62.81)     Time: 8828-0034 PT Time Calculation (min) (ACUTE ONLY): 24 min  Charges:  $Gait Training: 8-22 mins                     Vickki Muff, PT, DPT   Acute Rehabilitation Department Pager #: [phone]   Ronnie Derby 01/07/2021, 2:07 PM

## 2021-01-07 NOTE — Progress Notes (Signed)
1722- While standing at bedside, patient had a vagal episode and lost consciousness.  Unidentifiable rhythm on monitor.  Code Blue called.  Patient quickly placed back in bed and regained consciousness.  Patient stated that she had increased pain while standing and held her breath.  Heart failure team at bedside, plan of care reviewed. Son at bedside during episode.

## 2021-01-07 NOTE — Progress Notes (Signed)
Occupational Therapy Treatment Patient Details Name: Jane Navarro MRN: [id number] DOB: [birth year] Today's Date: 01/07/2021   History of present illness Pt is a 75 y.o. female admitted 12/29/20 with NSTEMI, cardiogenic shock. [facility] showed severe multivessel disease. S/p CP impella placed 9/27-10/3. S/p PCI to RCA DES x3 on 9/30; course complicated by hypotension, post-op bleeding in groin. PMH includes anemia, CVA, tobacco use; of note, recent admission 12/05/20 after pt found passed out in car in [employer] parking lot, found to have severe hypothyroidism.   OT comments  Pt making good progress with therapy today. Showing activity tolerance improvement to where in the future will plan on separate OT/PT sessions. Pt continues to be max A for LB ADL, and while improving continues to demonstrate deficits in activity tolerance, balance, independence. Pt able to sit EOB for engaging in grooming tasks as well as ambulation in room with EVA walker today with chair follow. Multimodal cues for safe hand placement and posture with in room mobility for ADL. OT will continue to follow acutely and remains excellent candidate for CIR level therapies to return to PLOF.    Recommendations for follow up therapy are one component of a multi-disciplinary discharge planning process, led by the attending physician.  Recommendations may be updated based on patient status, additional functional criteria and insurance authorization.    Follow Up Recommendations  CIR;Supervision/Assistance - 24 hour    Equipment Recommendations       Recommendations for Other Services      Precautions / Restrictions Precautions Precautions: Fall Precaution Comments: Multiple lines, bilateral groin wounds Required Braces or Orthoses: Other Brace (for comfort) Other Brace: endorses baseline vertigo, pt wears neoprene type sleeve on B knees due to arthritic pain Restrictions Weight Bearing Restrictions: No       Mobility Bed  Mobility Overal bed mobility: Needs Assistance Bed Mobility: Sit to Supine     Supine to sit: Min assist;+2 for safety/equipment;HOB elevated     General bed mobility comments: light assist for legs to come EOB, assist for trunk elevation, and use of pad to bring hips EOB    Transfers Overall transfer level: Needs assistance Equipment used: Bilateral platform walker (EVA walker) Transfers: Sit to/from Stand Sit to Stand: Min assist;+2 physical assistance;+2 safety/equipment;From elevated surface         General transfer comment: min A for lift, vc for safe hand placement and posture with EVA walker    Balance Overall balance assessment: Needs assistance Sitting-balance support: Feet supported Sitting balance-Leahy Scale: Fair Sitting balance - Comments: EOB and in recliner without support   Standing balance support: Bilateral upper extremity supported Standing balance-Leahy Scale: Poor Standing balance comment: UE support and assist for balance                           ADL either performed or assessed with clinical judgement   ADL Overall ADL's : Needs assistance/impaired     Grooming: Wash/dry face;Min guard;Sitting Grooming Details (indicate cue type and reason): EOB             Lower Body Dressing: Maximal assistance;Sitting/lateral leans Lower Body Dressing Details (indicate cue type and reason): to don socks Toilet Transfer: Minimal assistance;+2 for physical assistance;+2 for safety/equipment (EVA walker)   Toileting- Clothing Manipulation and Hygiene: Maximal assistance;Sit to/from stand       Functional mobility during ADLs: Minimal assistance;+2 for safety/equipment (EVA walker) General ADL Comments: demonstrating increased activity tolerance and continued motivation  Vision       Perception     Praxis      Cognition Arousal/Alertness: Awake/alert Behavior During Therapy: WFL for tasks assessed/performed Overall Cognitive  Status: Within Functional Limits for tasks assessed                                          Exercises     Shoulder Instructions       General Comments VSS on RA throughout session, daughter present throughout session    Pertinent Vitals/ Pain       Pain Assessment: Faces Faces Pain Scale: Hurts a little bit Pain Location: Bilateral knees (L>R) Pain Descriptors / Indicators: Discomfort;Grimacing Pain Intervention(s): Monitored during session;Repositioned  Home Living                                          Prior Functioning/Environment              Frequency  Min 2X/week        Progress Toward Goals  OT Goals(current goals can now be found in the care plan section)  Progress towards OT goals: Progressing toward goals  Acute Rehab OT Goals Patient Stated Goal: Get back home to dog; likely move in with son at d/c OT Goal Formulation: With patient Time For Goal Achievement: 01/19/21 Potential to Achieve Goals: Good  Plan Discharge plan remains appropriate    Co-evaluation    PT/OT/SLP Co-Evaluation/Treatment: Yes Reason for Co-Treatment: For patient/therapist safety;To address functional/ADL transfers PT goals addressed during session: Mobility/safety with mobility;Balance OT goals addressed during session: ADL's and self-care;Proper use of Adaptive equipment and DME      AM-PAC OT "6 Clicks" Daily Activity     Outcome Measure   Help from another person eating meals?: A Little Help from another person taking care of personal grooming?: A Little Help from another person toileting, which includes using toliet, bedpan, or urinal?: A Lot Help from another person bathing (including washing, rinsing, drying)?: A Lot Help from another person to put on and taking off regular upper body clothing?: A Little Help from another person to put on and taking off regular lower body clothing?: A Lot 6 Click Score: 15    End of  Session Equipment Utilized During Treatment: Gait belt (EVA walker)  OT Visit Diagnosis: Unsteadiness on feet (R26.81);Other abnormalities of gait and mobility (R26.89);Muscle weakness (generalized) (M62.81);Other symptoms and signs involving cognitive function;Pain Pain - Right/Left: Left Pain - part of body: Knee   Activity Tolerance Patient tolerated treatment well   Patient Left in chair;with call bell/phone within reach;with family/visitor present   Nurse Communication Mobility status        Time: 3419-3790 OT Time Calculation (min): 24 min  Charges: OT General Charges $OT Visit: 1 Visit OT Treatments $Self Care/Home Management : 8-22 mins  Nyoka Cowden OTR/L Acute Rehabilitation Services Pager: [phone] Office: [phone]   Evern Bio Gaytha Raybourn 01/07/2021, 12:18 PM

## 2021-01-08 DIAGNOSIS — I214 Non-ST elevation (NSTEMI) myocardial infarction: Secondary | ICD-10-CM | POA: Diagnosis not present

## 2021-01-08 LAB — COOXEMETRY PANEL
Carboxyhemoglobin: 1.5 % (ref 0.5–1.5)
Methemoglobin: 0.9 % (ref 0.0–1.5)
O2 Saturation: 68.8 %
Total hemoglobin: 9.5 g/dL — ABNORMAL LOW (ref 12.0–16.0)

## 2021-01-08 LAB — CBC
HCT: 30.4 % — ABNORMAL LOW (ref 36.0–46.0)
Hemoglobin: 9.6 g/dL — ABNORMAL LOW (ref 12.0–15.0)
MCH: 30.1 pg (ref 26.0–34.0)
MCHC: 31.6 g/dL (ref 30.0–36.0)
MCV: 95.3 fL (ref 80.0–100.0)
Platelets: 316 10*3/uL (ref 150–400)
RBC: 3.19 MIL/uL — ABNORMAL LOW (ref 3.87–5.11)
RDW: 15.3 % (ref 11.5–15.5)
WBC: 6.4 10*3/uL (ref 4.0–10.5)
nRBC: 0 % (ref 0.0–0.2)

## 2021-01-08 LAB — COMPREHENSIVE METABOLIC PANEL
ALT: 91 U/L — ABNORMAL HIGH (ref 0–44)
AST: 29 U/L (ref 15–41)
Albumin: 2 g/dL — ABNORMAL LOW (ref 3.5–5.0)
Alkaline Phosphatase: 86 U/L (ref 38–126)
Anion gap: 5 (ref 5–15)
BUN: 11 mg/dL (ref 8–23)
CO2: 25 mmol/L (ref 22–32)
Calcium: 8.3 mg/dL — ABNORMAL LOW (ref 8.9–10.3)
Chloride: 103 mmol/L (ref 98–111)
Creatinine, Ser: 0.65 mg/dL (ref 0.44–1.00)
GFR, Estimated: >60 mL/min (ref >60)
Glucose, Bld: 87 mg/dL (ref 70–99)
Potassium: 4.1 mmol/L (ref 3.5–5.1)
Sodium: 133 mmol/L — ABNORMAL LOW (ref 135–145)
Total Bilirubin: 1.2 mg/dL (ref 0.3–1.2)
Total Protein: 4.9 g/dL — ABNORMAL LOW (ref 6.5–8.1)

## 2021-01-08 LAB — MAGNESIUM: Magnesium: 2.1 mg/dL (ref 1.7–2.4)

## 2021-01-08 MED ORDER — SODIUM CHLORIDE 0.9 % IV BOLUS
250.0000 mL | Freq: Once | INTRAVENOUS | Status: AC
  Administered 2021-01-08: 250 mL via INTRAVENOUS

## 2021-01-08 MED ORDER — POTASSIUM CHLORIDE CRYS ER 20 MEQ PO TBCR: 40.0000 meq | EXTENDED_RELEASE_TABLET | ORAL | Status: DC

## 2021-01-08 NOTE — Plan of Care (Signed)
  Problem: Education: Goal: Knowledge of General Education information will improve Description: Including pain rating scale, medication(s)/side effects and non-pharmacologic comfort measures Outcome: Progressing   Problem: Health Behavior/Discharge Planning: Goal: Ability to manage health-related needs will improve Outcome: Progressing   Problem: Clinical Measurements: Goal: Ability to maintain clinical measurements within normal limits will improve Outcome: Progressing Goal: Will remain free from infection Outcome: Progressing Goal: Diagnostic test results will improve Outcome: Progressing Goal: Respiratory complications will improve Outcome: Progressing Goal: Cardiovascular complication will be avoided Outcome: Progressing   Problem: Activity: Goal: Risk for activity intolerance will decrease Outcome: Progressing   Problem: Nutrition: Goal: Adequate nutrition will be maintained Outcome: Progressing   Problem: Coping: Goal: Level of anxiety will decrease Outcome: Progressing   Problem: Elimination: Goal: Will not experience complications related to bowel motility Outcome: Progressing Goal: Will not experience complications related to urinary retention Outcome: Progressing   Problem: Pain Managment: Goal: General experience of comfort will improve Outcome: Progressing   Problem: Safety: Goal: Ability to remain free from injury will improve Outcome: Completed/Met   Problem: Skin Integrity: Goal: Risk for impaired skin integrity will decrease Outcome: Completed/Met   Problem: Education: Goal: Knowledge of the prescribed therapeutic regimen will improve Outcome: Completed/Met   Problem: Activity: Goal: Risk for activity intolerance will decrease Outcome: Completed/Met   Problem: Cardiac: Goal: Ability to maintain an adequate cardiac output will improve Outcome: Completed/Met   Problem: Coping: Goal: Level of anxiety will decrease Outcome: Completed/Met    Problem: Fluid Volume: Goal: Risk for excess fluid volume will decrease Outcome: Completed/Met   Problem: Clinical Measurements: Goal: Ability to maintain clinical measurements within normal limits will improve Outcome: Completed/Met Goal: Will remain free from infection Outcome: Completed/Met   Problem: Respiratory: Goal: Will regain and/or maintain adequate ventilation Outcome: Completed/Met

## 2021-01-08 NOTE — Progress Notes (Signed)
Pt transferred to 2C15 & received by Delaney Meigs RN at this time. All belongings gathered and 2H X2 returned to unit.

## 2021-01-08 NOTE — Progress Notes (Signed)
Inpatient Rehab Admissions Coordinator:   Per [facility] request:  Number for peer to peer [phone] ext [phone].  Need pt's name, DOB, and policy number.   Estill Dooms, PT, DPT Admissions Coordinator [phone] 01/08/21  4:22 PM

## 2021-01-08 NOTE — TOC CM/SW Note (Addendum)
434 pm HF TOC CM spoke to [facility] and to follow up on CIR auth. And P2P is set up with attending, their Medical Advisor Dr Theresia Lo will call today or Monday to complete P2P with attending.   Number for peer to peer [phone] ext [phone].  Need pt's name, DOB, and policy number.   Isidoro Donning RN3 CCM, Heart Failure TOC CM [phone]   0423 pm HF TOC CM contacted IP rehab rep, Luther Parody to follow up on peer to peer.  Isidoro Donning RN3 CCM, Heart Failure TOC CM [phone]

## 2021-01-08 NOTE — Progress Notes (Addendum)
Patient ID: Jane Navarro, female   DOB: 1946/01/12, 75 y.o.   MRN: [id number]     Advanced Heart Failure Rounding Note  PCP-Cardiologist: Parke Poisson, MD   Subjective:    - 9/27: NSTEMI-->Cardiogenic shock. Impella CP placed and started on milrinone. Given 2 UPRBCs. Milrinone increased to 0.375 mcg.  - 9/28: Continued on milrinone 0.375 mcg + impella P8. Started on losartan and spiro.  - 9/30:  PCI to RCA overlapping DES x 3, deferred intervention on left system.  Groin site bleeding, femstop applied and 2 units PRBCs given.  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  - 10/3: Impella extracted  10/6 Milrinone stopped. Started on losartan, and spiro. Lasix was increased. Around 1730 had possible vagal episode when standing with brief loss of consciousness.    CO-OX 69%.    Feeling ok. Denies chest pain. Denies shortness of breath. No dizziness.    Objective:   Weight Range: 65.8 kg Body mass index is 28.33 kg/m.   Vital Signs:   Temp:  [97.7 F (36.5 C)-98.2 F (36.8 C)] 98.2 F (36.8 C) (10/06 1900) Pulse Rate:  [60-89] 65 (10/07 0700) Resp:  [14-26] 15 (10/07 0700) BP: (88-131)/(57-82) 121/70 (10/07 0700) SpO2:  [85 %-100 %] 100 % (10/07 0700) Arterial Line BP: (78-137)/(45-72) 112/49 (10/06 1630) Weight:  [65.8 kg-67.5 kg] 65.8 kg (10/07 0620) Last BM Date: 01/07/21  Weight change: Filed Weights   01/07/21 0600 01/08/21 0000 01/08/21 0620  Weight: 68 kg 67.5 kg 65.8 kg    Intake/Output:   Intake/Output Summary (Last 24 hours) at 01/08/2021 0735 Last data filed at 01/08/2021 0600 Gross per 24 hour  Intake 6.75 ml  Output 1140 ml  Net -1133.25 ml     Physical Exam  CVP 2-3 personally checked.  General:  No resp difficulty HEENT: normal Neck: supple. no JVD. Carotids 2+ bilat; no bruits. No lymphadenopathy or thryomegaly appreciated. Cor: PMI nondisplaced. Regular rate & rhythm. No rubs, gallops or murmurs. Lungs: clear Abdomen: soft,  nontender, nondistended. No hepatosplenomegaly. No bruits or masses. Good bowel sounds. Extremities: no cyanosis, clubbing, rash, ted hose on bilaterally. NO edema. RUE PICC  Neuro: alert & orientedx3, cranial nerves grossly intact. moves all 4 extremities w/o difficulty. Affect pleasant  Telemetry   NSR personally checked.  Labs    CBC Recent Labs    01/06/21 0534 01/07/21 0520  WBC 7.6 6.3  HGB 9.3* 9.1*  HCT 28.1* 27.8*  MCV 93.7 92.7  PLT 141* 200   Basic Metabolic Panel Recent Labs    25/05/39 0520 01/08/21 0437  NA 135 133*  K 3.3* 4.1  CL 102 103  CO2 23 25  GLUCOSE 97 87  BUN 10 11  CREATININE 0.66 0.65  CALCIUM 7.8* 8.3*  MG 1.9 2.1   Liver Function Tests Recent Labs    01/07/21 0520 01/08/21 0437  AST 29 29  ALT 107* 91*  ALKPHOS 82 86  BILITOT 1.4* 1.2  PROT 4.5* 4.9*  ALBUMIN 1.8* 2.0*   No results for input(s): LIPASE, AMYLASE in the last 72 hours. Cardiac Enzymes No results for input(s): CKTOTAL, CKMB, CKMBINDEX, TROPONINI in the last 72 hours.  BNP: BNP (last 3 results) Recent Labs    12/29/20 0014  BNP >4,500.0*    ProBNP (last 3 results) No results for input(s): PROBNP in the last 8760 hours.   D-Dimer No results for input(s): DDIMER in the last 72 hours. Hemoglobin A1C No results for input(s): HGBA1C in  the last 72 hours. Fasting Lipid Panel No results for input(s): CHOL, HDL, LDLCALC, TRIG, CHOLHDL, LDLDIRECT in the last 72 hours. Thyroid Function Tests No results for input(s): TSH, T4TOTAL, T3FREE, THYROIDAB in the last 72 hours.  Invalid input(s): FREET3   Other results:   Imaging    No results found.   Medications:     Scheduled Medications:  aspirin EC  81 mg Oral Daily   atorvastatin  80 mg Oral Daily   chlorhexidine  15 mL Mouth Rinse BID   Chlorhexidine Gluconate Cloth  6 each Topical Daily   digoxin  0.125 mg Oral Daily   docusate sodium  200 mg Oral Daily   enoxaparin (LOVENOX) injection  40 mg  Subcutaneous Q24H   ezetimibe  10 mg Oral Daily   feeding supplement  237 mL Oral BID BM   furosemide  40 mg Oral Daily   levothyroxine  100 mcg Oral Q0600   losartan  12.5 mg Oral Daily   mouth rinse  15 mL Mouth Rinse q12n4p   polyethylene glycol  17 g Oral Daily   potassium chloride  20 mEq Oral Daily   senna  2 tablet Oral Daily   sodium chloride flush  10-40 mL Intracatheter Q12H   spironolactone  25 mg Oral Daily   thiamine  100 mg Oral Daily   ticagrelor  90 mg Oral BID    Infusions:  norepinephrine (LEVOPHED) Adult infusion Stopped (01/05/21 2305)    PRN Medications: acetaminophen, fentaNYL (SUBLIMAZE) injection, lip balm, ondansetron (ZOFRAN) IV, oxyCODONE-acetaminophen, sodium chloride, sodium chloride flush    Patient Profile   Jane Navarro is a 75 year old with history of htn, hyperlipidemia, CVA, hypothyroidism, and CKD Stage IIIa.   Admitted with chest pain--> NSTEMI -->cardiogenic shock    Assessment/Plan   1. 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 ECHO EF 25-30% and severely reduced RV function.  - LHC with severe L main with multivessel  proximal LAD and mild RCA .  - In the cath lab PA sat < 40%., CI 1.6.  Placed Impella 2.5 placed and started on milrinone  - Felt too high risk for CABG.  - 9/30 PCI to RCA with DES x 3, deferred intervention on left main with difficulty wiring the LAD and non-flow limiting dissection.  Can consider repeat PCI attempt (versus off-pump LIMA) down the road as she improves - Continue ASA 81, ticagrelor, atorvastatin. - No chest pain.    2.  Cardiogenic shock: Ischemic cardiomyopathy.  Echo with EF 25-30%, severely decreased RV function, moderate-severe TR, moderate MR. Required MCS w/ Impella. Impella removed 10/3. Remains on Milrinone 0.125. Co-ox stable at 62%   - CO-OX stable off milrinone. CVP low at 2-3. Stop lasix - Check orthostatics.  -Continue digoxin 0.125 mcg  -  Continue spiro 25 mg  - Continue Losartan held due to low BP - Avoiding SGLT2i due to groin infections.  - Continue ted hose.  2. Shock Liver  - LFTs improving.     3. Anemia  - Hgb dropped to 6.5 initially>> given 2UPRBCS with appropriate rise to 8.9.  - 1U RBCs given 9/29, Hgb 7.6>>9.1 - Significant hematoma left groin post-intervention, had 2 units PRBCs 9/30 with appropriate increase in hgb. IABP removed 10/3. Site remains stable  - Check CBC now.     4. Hypothyroidism  -TSH 4.8  -Continue levothyroxine.    5. UTI  - Urine culture NGTD - Completed course of rocephin  6. H/O CVA  7. AKI:  - resolved - Renal function normalized.   8. 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 Platelets hit a nadir at 33K.  Preliminary HIT negative, SRA pending.   -plts trending up, 33>>79> 141-> 200-> pending.   9. Constipation  - resolved  10. Debility - PT/OT following - Awaiting insurance approval for CIR   Can transfer to progressive care. Check CBC now. Check orthostatics.    Amy Clegg NP-C  7:35 AM  Patient seen and examined with the above-signed Advanced Practice Provider and/or Housestaff. I personally reviewed laboratory data, imaging studies and relevant notes. I independently examined the patient and formulated the important aspects of the plan. I have edited the note to reflect any of my changes or salient points. I have personally discussed the plan with the patient and/or family.  Feels ok. No CP or SOB. Had syncope with vagal episode yesterday.   Co-ox ok. CVP 3  General:  Weak appearing. No resp difficulty HEENT: normal Neck: supple. no JVD. Carotids 2+ bilat; no bruits. No lymphadenopathy or thryomegaly appreciated. Cor: PMI nondisplaced. Regular rate & rhythm. No rubs, gallops or murmurs. Lungs: clear Abdomen: soft, nontender, nondistended. No hepatosplenomegaly. No bruits or masses. Good bowel sounds. Groin wounds  healing  Extremities: no cyanosis, clubbing, rash, tr edema Neuro: alert & orientedx3, cranial nerves grossly intact. moves all 4 extremities w/o difficulty. Affect pleasant  Remains a bit tenuous with vagal episode yesterday. Agree with stopping lasix. Can move to SDU today. Continue to work with PT/OT. Needs inpatient rehab. Check orthostatics. Continue current HF meds.   Arvilla Meres, MD  11:50 AM

## 2021-01-09 DIAGNOSIS — I214 Non-ST elevation (NSTEMI) myocardial infarction: Secondary | ICD-10-CM | POA: Diagnosis not present

## 2021-01-09 LAB — CBC
HCT: 28.9 % — ABNORMAL LOW (ref 36.0–46.0)
Hemoglobin: 9.4 g/dL — ABNORMAL LOW (ref 12.0–15.0)
MCH: 30.4 pg (ref 26.0–34.0)
MCHC: 32.5 g/dL (ref 30.0–36.0)
MCV: 93.5 fL (ref 80.0–100.0)
Platelets: 333 10*3/uL (ref 150–400)
RBC: 3.09 MIL/uL — ABNORMAL LOW (ref 3.87–5.11)
RDW: 15.2 % (ref 11.5–15.5)
WBC: 6.4 10*3/uL (ref 4.0–10.5)
nRBC: 0 % (ref 0.0–0.2)

## 2021-01-09 LAB — COMPREHENSIVE METABOLIC PANEL
ALT: 78 U/L — ABNORMAL HIGH (ref 0–44)
AST: 33 U/L (ref 15–41)
Albumin: 1.9 g/dL — ABNORMAL LOW (ref 3.5–5.0)
Alkaline Phosphatase: 85 U/L (ref 38–126)
Anion gap: 6 (ref 5–15)
BUN: 11 mg/dL (ref 8–23)
CO2: 24 mmol/L (ref 22–32)
Calcium: 8.3 mg/dL — ABNORMAL LOW (ref 8.9–10.3)
Chloride: 105 mmol/L (ref 98–111)
Creatinine, Ser: 0.71 mg/dL (ref 0.44–1.00)
GFR, Estimated: >60 mL/min (ref >60)
Glucose, Bld: 79 mg/dL (ref 70–99)
Potassium: 3.9 mmol/L (ref 3.5–5.1)
Sodium: 135 mmol/L (ref 135–145)
Total Bilirubin: 1.1 mg/dL (ref 0.3–1.2)
Total Protein: 4.8 g/dL — ABNORMAL LOW (ref 6.5–8.1)

## 2021-01-09 LAB — COOXEMETRY PANEL
Carboxyhemoglobin: 1.8 % — ABNORMAL HIGH (ref 0.5–1.5)
Methemoglobin: 1 % (ref 0.0–1.5)
O2 Saturation: 59.8 %
Total hemoglobin: 11.6 g/dL — ABNORMAL LOW (ref 12.0–16.0)

## 2021-01-09 LAB — MAGNESIUM: Magnesium: 1.9 mg/dL (ref 1.7–2.4)

## 2021-01-09 MED ORDER — MAGNESIUM SULFATE 2 GM/50ML IV SOLN
2.0000 g | Freq: Once | INTRAVENOUS | Status: AC
  Administered 2021-01-09: 2 g via INTRAVENOUS
  Filled 2021-01-09: qty 50

## 2021-01-09 NOTE — Progress Notes (Addendum)
Patient ID: Jane Navarro, female   DOB: [birth year], 75 y.o.   MRN: [id number]     Advanced Heart Failure Rounding Note  PCP-Cardiologist: Parke Poisson, MD   Subjective:    - 9/27: NSTEMI-->Cardiogenic shock. Impella CP placed and started on milrinone. Given 2 UPRBCs. Milrinone increased to 0.375 mcg.  - 9/28: Continued on milrinone 0.375 mcg + impella P8. Started on losartan and spiro.  - 9/30:  PCI to RCA overlapping DES x 3, deferred intervention on left system.  Groin site bleeding, femstop applied and 2 units PRBCs given.  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  - 10/3: Impella extracted  10/6 Milrinone stopped. Started on losartan, and spiro. Lasix was increased. Around 1730 had possible vagal episode when standing with brief loss of consciousness.   Off milrinone. Co-ox 60%. CVP low. Orthostatic yesterday. Diuretics held.   Denies CP or SOB. Feels weak. No further syncope. Rhythm stable    Objective:   Weight Range: 68.4 kg Body mass index is 29.45 kg/m.   Vital Signs:   Temp:  [98 F (36.7 C)-98.4 F (36.9 C)] 98 F (36.7 C) (10/08 1058) Pulse Rate:  [63-74] 74 (10/08 0702) Resp:  [14-22] 20 (10/08 1058) BP: (97-125)/(61-73) 112/73 (10/08 1058) SpO2:  [90 %-100 %] 96 % (10/08 1058) Weight:  [68.4 kg] 68.4 kg (10/08 0256) Last BM Date: 01/17/21  Weight change: Filed Weights   01/08/21 0000 01/08/21 0620 01/09/21 0256  Weight: 67.5 kg 65.8 kg 68.4 kg    Intake/Output:   Intake/Output Summary (Last 24 hours) at 01/09/2021 1157 Last data filed at 01/09/2021 0700 Gross per 24 hour  Intake 760.17 ml  Output 750 ml  Net 10.17 ml      Physical Exam   General:  Weak appearing. No resp difficulty HEENT: normal Neck: supple. no JVD. Carotids 2+ bilat; no bruits. No lymphadenopathy or thryomegaly appreciated. Cor: PMI nondisplaced. Regular rate & rhythm. No rubs, gallops or murmurs. Lungs: clear Abdomen: soft, nontender, nondistended. No  hepatosplenomegaly. No bruits or masses. Good bowel sounds. Groin wounds healing Extremities: no cyanosis, clubbing, rash, 1+ edema Neuro: alert & orientedx3, cranial nerves grossly intact. moves all 4 extremities w/o difficulty. Affect pleasant   Telemetry   NSR 70s Personally reviewed Personally reviewed  Labs    CBC Recent Labs    01/08/21 0952 01/09/21 0311  WBC 6.4 6.4  HGB 9.6* 9.4*  HCT 30.4* 28.9*  MCV 95.3 93.5  PLT 316 333    Basic Metabolic Panel Recent Labs    74/25/95 0437 01/09/21 0311  NA 133* 135  K 4.1 3.9  CL 103 105  CO2 25 24  GLUCOSE 87 79  BUN 11 11  CREATININE 0.65 0.71  CALCIUM 8.3* 8.3*  MG 2.1 1.9    Liver Function Tests Recent Labs    01/08/21 0437 01/09/21 0311  AST 29 33  ALT 91* 78*  ALKPHOS 86 85  BILITOT 1.2 1.1  PROT 4.9* 4.8*  ALBUMIN 2.0* 1.9*    No results for input(s): LIPASE, AMYLASE in the last 72 hours. Cardiac Enzymes No results for input(s): CKTOTAL, CKMB, CKMBINDEX, TROPONINI in the last 72 hours.  BNP: BNP (last 3 results) Recent Labs    12/29/20 0014  BNP >4,500.0*     ProBNP (last 3 results) No results for input(s): PROBNP in the last 8760 hours.   D-Dimer No results for input(s): DDIMER in the last 72 hours. Hemoglobin A1C No results for input(s): HGBA1C  in the last 72 hours. Fasting Lipid Panel No results for input(s): CHOL, HDL, LDLCALC, TRIG, CHOLHDL, LDLDIRECT in the last 72 hours. Thyroid Function Tests No results for input(s): TSH, T4TOTAL, T3FREE, THYROIDAB in the last 72 hours.  Invalid input(s): FREET3   Other results:   Imaging    No results found.   Medications:     Scheduled Medications:  aspirin EC  81 mg Oral Daily   atorvastatin  80 mg Oral Daily   chlorhexidine  15 mL Mouth Rinse BID   Chlorhexidine Gluconate Cloth  6 each Topical Daily   digoxin  0.125 mg Oral Daily   docusate sodium  200 mg Oral Daily   enoxaparin (LOVENOX) injection  40 mg Subcutaneous  Q24H   ezetimibe  10 mg Oral Daily   feeding supplement  237 mL Oral BID BM   levothyroxine  100 mcg Oral Q0600   losartan  12.5 mg Oral Daily   mouth rinse  15 mL Mouth Rinse q12n4p   polyethylene glycol  17 g Oral Daily   potassium chloride  20 mEq Oral Daily   senna  2 tablet Oral Daily   sodium chloride flush  10-40 mL Intracatheter Q12H   spironolactone  25 mg Oral Daily   thiamine  100 mg Oral Daily   ticagrelor  90 mg Oral BID    Infusions:  norepinephrine (LEVOPHED) Adult infusion Stopped (01/05/21 2305)    PRN Medications: acetaminophen, fentaNYL (SUBLIMAZE) injection, lip balm, ondansetron (ZOFRAN) IV, oxyCODONE-acetaminophen, sodium chloride, sodium chloride flush    Patient Profile   Ms Viruet is a 75 year old with history of htn, hyperlipidemia, CVA, hypothyroidism, and CKD Stage IIIa.   Admitted with chest pain--> NSTEMI -->cardiogenic shock    Assessment/Plan   1. 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 ECHO EF 25-30% and severely reduced RV function.  - LHC with severe L main with multivessel  proximal LAD and mild RCA .  - In the cath lab PA sat < 40%., CI 1.6.  Placed Impella 2.5 placed and started on milrinone  - Felt too high risk for CABG.  - 9/30 PCI to RCA with DES x 3, deferred intervention on left main with difficulty wiring the LAD and non-flow limiting dissection.  Can consider repeat PCI attempt (versus off-pump LIMA) down the road as she improves - Continue ASA 81, ticagrelor, atorvastatin. - No s/s angina  2.  Acute systolic HF ->  Cardiogenic shock:  - Ischemic cardiomyopathy.  Echo with EF 25-30%, severely decreased RV function, moderate-severe TR, moderate MR. Required MCS w/ Impella. Impella removed 10/3.  - Co-ox stable off milrinone.  - Was orthostatic yesterday. Diuretics on hold - Continue digoxin 0.125 mcg  - Continue spiro 25 mg  - Continue losartan 12.5 - Avoiding SGLT2i due to groin infections.  -  Continue ted hose.  3. Shock Liver  - LFTs improving.     4. Anemia  - Hgb dropped to 6.5 initially>> given 2UPRBCS with appropriate rise to 8.9.  - 1U RBCs given 9/29, Hgb 7.6>>9.1 > 9.4 - Significant hematoma left groin post-intervention, had 2 units PRBCs 9/30 with appropriate increase in hgb. IABP removed 10/3. Site remains stable   5. Thrombocytopenia due to Impella - resolved  6. AKI:  - resolved - Renal function normalized.   7. Debility - PT/OT following. Recommend CIR - Denied CRI despite medical necessity. I called for peer-to-peer with Dr. Leontine Locket on Friday morning. Still awaiting call  back   Arvilla Meres MD 11:57 AM

## 2021-01-09 NOTE — Progress Notes (Signed)
Provided education to patient, daughter, son-in-law. Provided MI and CHF booklets to patient. Provided stent cards, encouraged to carry and daughter took possession. Stressed the use of [place] and being compliant with all meds. Reviewed heart healthy and low Na diets; provided handouts. Stressed daily weight, S/S to report to MD. Pt is pending CIR so discussed exercise in general terms and stressed its importance for when she is stronger. Encouraged CRP2 program when appropriate and referral to [facility] sent to meet requirement. Pt and family verbalized understanding of the education.  Lorin Picket MS, ACSM-CEP, CCRP  12:45 - 13:49

## 2021-01-10 DIAGNOSIS — R570 Cardiogenic shock: Secondary | ICD-10-CM | POA: Diagnosis not present

## 2021-01-10 DIAGNOSIS — I214 Non-ST elevation (NSTEMI) myocardial infarction: Secondary | ICD-10-CM | POA: Diagnosis not present

## 2021-01-10 LAB — COMPREHENSIVE METABOLIC PANEL
ALT: 64 U/L — ABNORMAL HIGH (ref 0–44)
AST: 32 U/L (ref 15–41)
Albumin: 1.9 g/dL — ABNORMAL LOW (ref 3.5–5.0)
Alkaline Phosphatase: 80 U/L (ref 38–126)
Anion gap: 4 — ABNORMAL LOW (ref 5–15)
BUN: 9 mg/dL (ref 8–23)
CO2: 26 mmol/L (ref 22–32)
Calcium: 8.1 mg/dL — ABNORMAL LOW (ref 8.9–10.3)
Chloride: 103 mmol/L (ref 98–111)
Creatinine, Ser: 0.68 mg/dL (ref 0.44–1.00)
GFR, Estimated: >60 mL/min (ref >60)
Glucose, Bld: 83 mg/dL (ref 70–99)
Potassium: 3.9 mmol/L (ref 3.5–5.1)
Sodium: 133 mmol/L — ABNORMAL LOW (ref 135–145)
Total Bilirubin: 1.1 mg/dL (ref 0.3–1.2)
Total Protein: 4.8 g/dL — ABNORMAL LOW (ref 6.5–8.1)

## 2021-01-10 LAB — CULTURE, BLOOD (ROUTINE X 2)
Culture: NO GROWTH
Culture: NO GROWTH
Special Requests: ADEQUATE
Special Requests: ADEQUATE

## 2021-01-10 LAB — CBC
HCT: 28.5 % — ABNORMAL LOW (ref 36.0–46.0)
HCT: 29.2 % — ABNORMAL LOW (ref 36.0–46.0)
Hemoglobin: 9.3 g/dL — ABNORMAL LOW (ref 12.0–15.0)
Hemoglobin: 9.3 g/dL — ABNORMAL LOW (ref 12.0–15.0)
MCH: 30.7 pg (ref 26.0–34.0)
MCH: 30 pg (ref 26.0–34.0)
MCHC: 32.6 g/dL (ref 30.0–36.0)
MCHC: 31.8 g/dL (ref 30.0–36.0)
MCV: 94.1 fL (ref 80.0–100.0)
MCV: 94.2 fL (ref 80.0–100.0)
Platelets: 371 10*3/uL (ref 150–400)
Platelets: 359 10*3/uL (ref 150–400)
RBC: 3.03 MIL/uL — ABNORMAL LOW (ref 3.87–5.11)
RBC: 3.1 MIL/uL — ABNORMAL LOW (ref 3.87–5.11)
RDW: 15.3 % (ref 11.5–15.5)
RDW: 15.2 % (ref 11.5–15.5)
WBC: 5.5 10*3/uL (ref 4.0–10.5)
WBC: 5.7 10*3/uL (ref 4.0–10.5)
nRBC: 0 % (ref 0.0–0.2)
nRBC: 0 % (ref 0.0–0.2)

## 2021-01-10 LAB — COOXEMETRY PANEL
Carboxyhemoglobin: 2.5 % — ABNORMAL HIGH (ref 0.5–1.5)
Carboxyhemoglobin: 2.4 % — ABNORMAL HIGH (ref 0.5–1.5)
Methemoglobin: 0.8 % (ref 0.0–1.5)
Methemoglobin: 0.8 % (ref 0.0–1.5)
O2 Saturation: 60 %
O2 Saturation: 59.5 %
Total hemoglobin: 6.3 g/dL — CL (ref 12.0–16.0)
Total hemoglobin: 8.3 g/dL — ABNORMAL LOW (ref 12.0–16.0)

## 2021-01-10 LAB — MAGNESIUM: Magnesium: 2.2 mg/dL (ref 1.7–2.4)

## 2021-01-10 MED ORDER — MAGNESIUM HYDROXIDE 400 MG/5ML PO SUSP
30.0000 mL | Freq: Every day | ORAL | Status: DC
  Administered 2021-01-10 – 2021-01-11 (×2): 30 mL via ORAL
  Filled 2021-01-10 (×2): qty 30

## 2021-01-10 NOTE — Progress Notes (Signed)
Date and time results received: 01/10/21 0451  Test: Total Hgb Critical Value: 6.3  Name of Provider Notified: Cherly Beach  Orders Received? Or Actions Taken?:  Awaiting orders

## 2021-01-10 NOTE — Progress Notes (Signed)
Paged at 0504 AM for critical lab value, Hb 6.3.  BP 120/60s, asx, bruising per nursing report from prior access sites. She was admitted for cardiogenic shock on 09/26 and found to have severe LM and MV dz with pLAD and mRCA lesions. Impella CP was placed (RFA) which was c/b hematoma from small branch of RFA during procedure requiring fem stop. Additional vascular access during the procedure was R IJV for RHC and RRA for coronary angio before converting to femoral approach. She had PCI to RCA with DES x3 on 01/01/21 (LFA access) and LM intervention deferred with difficulty of wiring of LAD and partial dissection. Currently on ASA/ticagrelor.   Hb (6.3) at 0423 checked from double lumen PICC. Clarified with nursing whether there was concern for sample error based off how it was drawn, felt there was no issue with sample. Recent labs 1 hour ago (0331) with stable Hb (9.3) which is similar to past several days.   Patient asleep during my assessment. Denied any new leg/groin or back pain after waking up. L abdomen with erythema which has been stable over the past day. Both access sites without new bleeding. No flank ecchymoses c/f RP bleed. No tenderness to palpation.   VS: HR 67, BP 120/68 (83), RR 18, SpO2 96% on RA  Recheck was sent with Hb (8.3) however this was on co-ox not CBC. Has had discordance on several co-ox/CBC within similar time frame.   Recheck CBC ordered for 0900. F/u to ensure it is not further down trending given DAPT and b/l femoral access sites.

## 2021-01-10 NOTE — TOC Initial Note (Deleted)
Transition of Care [facility]) - Initial/Assessment Note    Patient Details  Name: Jane Navarro MRN: [id number] Date of Birth: [birth year]  [facility] CM/SW Contact:    Lockie Pares, RN Phone Number: 01/10/2021, 12:32 PM  Clinical Narrative:                  Patient in with chest pain, splenic infarct resolving. Long history of chronic pain,  recent endocarditis, on oral antibiotics. She needs new PCP, was with [facility]. Recommended [facility]- need to make appointment for her tomorrow for post hospital visit. She is agreeable to home health should she need it, and wants a agency that works best with her insurance. She has a wheelchair and walker at home and denies need for further DME>  Because she does not have a PCP, may be difficult to obtain [facility]  CM will follow for needs, recommendations, and transitions.   Expected Discharge Plan: Home w Home Health Services Barriers to Discharge: Continued Medical Work up   Patient Goals and CMS Choice        Expected Discharge Plan and Services Expected Discharge Plan: Home w Home Health Services In-house Referral: Clinical Social Work Discharge Planning Services: CM Consult   Living arrangements for the past 2 months: Single Family Home                                      Prior Living Arrangements/Services Living arrangements for the past 2 months: Single Family Home Lives with:: Self Patient language and need for interpreter reviewed:: Yes Do you feel safe going back to the place where you live?: Yes      Need for Family Participation in Patient Care: Yes (Comment) Care giver support system in place?: Yes (comment) Current home services: DME (rolling walker with seat, bedside commode, wheelchair) Criminal Activity/Legal Involvement Pertinent to Current Situation/Hospitalization: No - Comment as needed  Activities of Daily Living Home Assistive Devices/Equipment: Walker (specify  type) ADL Screening (condition at time of admission) Patient's cognitive ability adequate to safely complete daily activities?: No Is the patient deaf or have difficulty hearing?: No Does the patient have difficulty seeing, even when wearing glasses/contacts?: No Does the patient have difficulty concentrating, remembering, or making decisions?: No Patient able to express need for assistance with ADLs?: Yes Does the patient have difficulty dressing or bathing?: Yes Independently performs ADLs?: No Communication: Independent Dressing (OT): Needs assistance Is this a change from baseline?: Pre-admission baseline Grooming: Needs assistance Is this a change from baseline?: Pre-admission baseline Feeding: Independent Bathing: Needs assistance Is this a change from baseline?: Pre-admission baseline Toileting: Needs assistance Is this a change from baseline?: Pre-admission baseline In/Out Bed: Independent Walks in Home: Needs assistance Is this a change from baseline?: Pre-admission baseline Does the patient have difficulty walking or climbing stairs?: Yes Weakness of Legs: Both Weakness of Arms/Hands: None  Permission Sought/Granted Permission sought to share information with : Case Manager, Family Supports, PCP Permission granted to share information with : Yes, Verbal Permission Granted  Share Information with NAME: Georgeanna Lea     Permission granted to share info w Relationship: Adult Children  Permission granted to share info w Contact Information: [phone]  Emotional Assessment Appearance:: Appears stated age Attitude/Demeanor/Rapport: Gracious, Lethargic Affect (typically observed): Pleasant Orientation: : Oriented to Self, Oriented to Place, Oriented to  Time, Oriented to Situation   Psych  Involvement: No (comment)  Admission diagnosis:  Altered mental status [R41.82] Transaminitis [R74.01] NSTEMI (non-ST elevated myocardial infarction) (HCC) [I21.4] Acute cystitis  without hematuria [N30.00] Acute heart failure, unspecified heart failure type [facility]) [I50.9] Patient Active Problem List   Diagnosis Date Noted   NSTEMI (non-ST elevated myocardial infarction) (HCC) 12/29/2020   Leukocytosis 12/29/2020   Macrocytic anemia 12/29/2020   AKI (acute kidney injury) (HCC) 12/29/2020   Urinary tract infection 12/29/2020   Weakness 12/29/2020   SIRS (systemic inflammatory response syndrome) (HCC) 12/29/2020   Cerebral thrombosis with cerebral infarction 12/09/2020   Acute metabolic encephalopathy 12/05/2020   Hypertensive urgency 12/05/2020   CKD (chronic kidney disease), stage III (HCC) 12/05/2020   Normocytic anemia 12/05/2020   Transaminitis 12/05/2020   PCP:  Pcp, No Pharmacy:   [id number] [place], [place] - [address] AT [facility] OF [address] [place] [address] Phone: [phone] Fax: [phone]     Social Determinants of Health (SDOH) Interventions Food Insecurity Interventions: Intervention Not Indicated Financial Strain Interventions: Intervention Not Indicated Housing Interventions: Intervention Not Indicated Transportation Interventions: Intervention Not Indicated  Readmission Risk Interventions No flowsheet data found.

## 2021-01-10 NOTE — Progress Notes (Addendum)
   I was notified by RN that patient had another syncopal/vasovagal event.  She was sitting in hospital chair when nursing staff went to reposition her and pull her up a little bit.  When this happened, her eyes rolled back in her head and she became unresponsive.  She never lost a pulse.  Rapid response was called.  Recliner was lowered and as soon as patient was laying flat in the chair, she regained consciousness.  RN states she was unconscious for about 10 to 15 seconds.  As soon as she regained consciousness she was back to her usual self.  BP at this time was 150/101.  Staff reviewed telemetry after this event and noted no changes from baseline.  She is currently back in bed and feeling fine.  Most recent BP in the 120s/70s. Discussed with Dr. Gala Romney- no medication changes at this time.  We will continue to monitor.  This is another reason patient may benefit from CIR which we are working on getting approval for.  Corrin Parker, PA-C 01/10/2021 2:44 PM Pager: [phone]  I went by to check on patient earlier this afternoon around 4pm. Patient resting comfortably in bed. No recurrent syncopal episodes. Spoke with RN who was presented during above event and it sounds similar to the event she had earlier this admission while in 2H. Vitals stable. Personally reviewed telemetry and did not see any abnormal rhythms around time of event. Will continue to monitor.  Corrin Parker, PA-C 01/10/2021 4:40 PM

## 2021-01-10 NOTE — Significant Event (Signed)
Rapid Response Event Note   Reason for Call :  Brief loss of consciousness while staff was pulling her up in the chair  Initial Focused Assessment:  Staff went to pull pt up in the chair. Pt states that she was very tired. When they pulled her up, staff noted her eyes to roll back and she briefly went unresponsive. Staff quickly laid pt flat in chair and she regained consciousness.   Pt AO upon my arrival. Pt was moved back to bed by staff. Skin is warm, moist, pink. Pt in no distress. She states she is slowly feeling better, but does not endorse any specific concern.   Telemetry reviewed, no acute changes noted prior to or during event.   VS: 98.47F, BP 150/101, HR 85, RR 24, SpO2 96% on room air  Interventions:  -No interventions, VSS upon my evaluation  Plan of Care:  -Routine VS checks -Continuous telemetry monitoring -Bedrest unless otherwise specified by MD  Call rapid response for additional needs  Event Summary:  MD Notified: per RN Call Time: 1345 Arrival Time: 1346 End Time: 1400  Jennye Moccasin, RN

## 2021-01-10 NOTE — Progress Notes (Signed)
Patient ID: Jane Navarro, female   DOB: [birth year], 75 y.o.   MRN: [id number]     Advanced Heart Failure Rounding Note  PCP-Cardiologist: Parke Poisson, MD   Subjective:    - 9/27: NSTEMI-->Cardiogenic shock. Impella CP placed and started on milrinone. Given 2 UPRBCs. Milrinone increased to 0.375 mcg.  - 9/28: Continued on milrinone 0.375 mcg + impella P8. Started on losartan and spiro.  - 9/30:  PCI to RCA overlapping DES x 3, deferred intervention on left system.  Groin site bleeding, femstop applied and 2 units PRBCs given.  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  - 10/3: Impella extracted  10/6 Milrinone stopped. Started on losartan, and spiro. Lasix was increased. Around 1730 had possible vagal episode when standing with brief loss of consciousness.   Remains off milrinone. Co-ox 60% Feels ok but still weak. Able to stand and walk a little with RNs. Eager to get better.  There was some concern over a drop in hgb last night.but groin sites stable. Hgb stable at 9.3 this am   Objective:   Weight Range: 68.4 kg Body mass index is 29.45 kg/m.   Vital Signs:   Temp:  [98 F (36.7 C)-98.5 F (36.9 C)] 98.2 F (36.8 C) (10/09 0730) Pulse Rate:  [65-98] 74 (10/09 0730) Resp:  [16-20] 16 (10/09 0730) BP: (112-127)/(63-76) 118/68 (10/09 0730) SpO2:  [93 %-97 %] 94 % (10/09 0730) Last BM Date: 01/09/21  Weight change: Filed Weights   01/08/21 0000 01/08/21 0620 01/09/21 0256  Weight: 67.5 kg 65.8 kg 68.4 kg    Intake/Output:   Intake/Output Summary (Last 24 hours) at 01/10/2021 1035 Last data filed at 01/10/2021 0846 Gross per 24 hour  Intake 770 ml  Output 500 ml  Net 270 ml      Physical Exam   General:  Weak appearing. No resp difficulty General:  Well appearing. No resp difficulty HEENT: normal Neck: supple. no JVD. Carotids 2+ bilat; no bruits. No lymphadenopathy or thryomegaly appreciated. Cor: PMI nondisplaced. Regular rate & rhythm. No  rubs, gallops or murmurs. Lungs: clear Abdomen: soft, nontender, nondistended. No hepatosplenomegaly. No bruits or masses. Good bowel sounds. Extremities: no cyanosis, clubbing, rash, edema groin sites healing well  Neuro: alert & orientedx3, cranial nerves grossly intact. moves all 4 extremities w/o difficulty. Affect pleasant   Telemetry   NSR 70s Personally reviewed  Labs    CBC Recent Labs    01/10/21 0331 01/10/21 0915  WBC 5.5 5.7  HGB 9.3* 9.3*  HCT 28.5* 29.2*  MCV 94.1 94.2  PLT 371 359    Basic Metabolic Panel Recent Labs    60/73/71 0311 01/10/21 0331  NA 135 133*  K 3.9 3.9  CL 105 103  CO2 24 26  GLUCOSE 79 83  BUN 11 9  CREATININE 0.71 0.68  CALCIUM 8.3* 8.1*  MG 1.9 2.2    Liver Function Tests Recent Labs    01/09/21 0311 01/10/21 0331  AST 33 32  ALT 78* 64*  ALKPHOS 85 80  BILITOT 1.1 1.1  PROT 4.8* 4.8*  ALBUMIN 1.9* 1.9*    No results for input(s): LIPASE, AMYLASE in the last 72 hours. Cardiac Enzymes No results for input(s): CKTOTAL, CKMB, CKMBINDEX, TROPONINI in the last 72 hours.  BNP: BNP (last 3 results) Recent Labs    12/29/20 0014  BNP >4,500.0*     ProBNP (last 3 results) No results for input(s): PROBNP in the last 8760 hours.  D-Dimer No results for input(s): DDIMER in the last 72 hours. Hemoglobin A1C No results for input(s): HGBA1C in the last 72 hours. Fasting Lipid Panel No results for input(s): CHOL, HDL, LDLCALC, TRIG, CHOLHDL, LDLDIRECT in the last 72 hours. Thyroid Function Tests No results for input(s): TSH, T4TOTAL, T3FREE, THYROIDAB in the last 72 hours.  Invalid input(s): FREET3   Other results:   Imaging    No results found.   Medications:     Scheduled Medications:  aspirin EC  81 mg Oral Daily   atorvastatin  80 mg Oral Daily   chlorhexidine  15 mL Mouth Rinse BID   Chlorhexidine Gluconate Cloth  6 each Topical Daily   digoxin  0.125 mg Oral Daily   docusate sodium  200 mg  Oral Daily   enoxaparin (LOVENOX) injection  40 mg Subcutaneous Q24H   ezetimibe  10 mg Oral Daily   feeding supplement  237 mL Oral BID BM   levothyroxine  100 mcg Oral Q0600   losartan  12.5 mg Oral Daily   mouth rinse  15 mL Mouth Rinse q12n4p   polyethylene glycol  17 g Oral Daily   potassium chloride  20 mEq Oral Daily   senna  2 tablet Oral Daily   sodium chloride flush  10-40 mL Intracatheter Q12H   spironolactone  25 mg Oral Daily   thiamine  100 mg Oral Daily   ticagrelor  90 mg Oral BID    Infusions:    PRN Medications: acetaminophen, fentaNYL (SUBLIMAZE) injection, lip balm, ondansetron (ZOFRAN) IV, oxyCODONE-acetaminophen, sodium chloride, sodium chloride flush    Patient Profile   Jane Navarro is a 75 year old with history of htn, hyperlipidemia, CVA, hypothyroidism, and CKD Stage IIIa.   Admitted with chest pain--> NSTEMI -->cardiogenic shock    Assessment/Plan   1. 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 ECHO EF 25-30% and severely reduced RV function.  - LHC with severe L main with multivessel  proximal LAD and mild RCA .  - In the cath lab PA sat < 40%., CI 1.6.  Placed Impella 2.5 placed and started on milrinone  - Felt too high risk for CABG.  - 9/30 PCI to RCA with DES x 3, deferred intervention on left main with difficulty wiring the LAD and non-flow limiting dissection.  Can consider repeat PCI attempt (versus off-pump LIMA) down the road as she improves - Continue ASA 81, ticagrelor, atorvastatin. - No s/s angina  2.  Acute systolic HF ->  Cardiogenic shock:  - Ischemic cardiomyopathy.  Echo with EF 25-30%, severely decreased RV function, moderate-severe TR, moderate MR. Required MCS w/ Impella. Impella removed 10/3.  - Co-ox stable off milrinone.  - Diuretics on hold due to orthostasis - Continue digoxin 0.125 mcg  - Continue spiro 25 mg  - Continue losartan 12.5 - Avoiding SGLT2i due to groin infections.  - Continue ted  hose.  3. Shock Liver  - LFTs improving.     4. Anemia  - Hgb dropped to 6.5 initially>> given 2UPRBCS with appropriate rise to 8.9.  - 1U RBCs given 9/29,  - Significant hematoma left groin post-intervention, had 2 units PRBCs 9/30 with appropriate increase in hgb. IABP removed 10/3. Site remains stable  - Hgb 7.6>>9.1 > 9.4 > 9.3  5. Thrombocytopenia due to Impella - resolved  6. AKI:  - resolved - Renal function normalized.   7. Debility - PT/OT following. Recommend CIR - Denied CRI despite medical necessity.  I called for peer-to-peer with Dr. Leontine Locket on Friday morning. Still awaiting call back   Arvilla Meres MD 10:35 AM

## 2021-01-10 NOTE — Progress Notes (Signed)
Patient was assisted to recliner about 11am, ate lunch and was resting in chair comfortably, but needed to be pulled up a little, nurse and tech moved patient upward in chair for comfort and patient had seizure like look and seemed to vaso vagal, remained with pulse but went unresponsive--rapid and code called--this lasted for appx 10 seconds--once patient was laid back completely, she woke up alert and oriented x4, no significant change with telemetry review, heart failure team paged to advise

## 2021-01-11 ENCOUNTER — Encounter (HOSPITAL_COMMUNITY): Payer: Self-pay | Admitting: Physical Medicine & Rehabilitation

## 2021-01-11 ENCOUNTER — Other Ambulatory Visit: Payer: Self-pay

## 2021-01-11 ENCOUNTER — Inpatient Hospital Stay (HOSPITAL_COMMUNITY)
Admission: RE | Admit: 2021-01-11 | Discharge: 2021-01-28 | DRG: 945 | Disposition: A | Discharge status: Home Health | Payer: Medicare HMO | Source: Intra-hospital | Attending: Physical Medicine & Rehabilitation | Admitting: Physical Medicine & Rehabilitation

## 2021-01-11 DIAGNOSIS — M25561 Pain in right knee: Secondary | ICD-10-CM

## 2021-01-11 DIAGNOSIS — K5900 Constipation, unspecified: Secondary | ICD-10-CM | POA: Diagnosis present

## 2021-01-11 DIAGNOSIS — E871 Hypo-osmolality and hyponatremia: Secondary | ICD-10-CM | POA: Diagnosis not present

## 2021-01-11 DIAGNOSIS — R5381 Other malaise: Secondary | ICD-10-CM | POA: Diagnosis present

## 2021-01-11 DIAGNOSIS — Z955 Presence of coronary angioplasty implant and graft: Secondary | ICD-10-CM | POA: Diagnosis not present

## 2021-01-11 DIAGNOSIS — Z803 Family history of malignant neoplasm of breast: Secondary | ICD-10-CM | POA: Diagnosis not present

## 2021-01-11 DIAGNOSIS — I214 Non-ST elevation (NSTEMI) myocardial infarction: Secondary | ICD-10-CM | POA: Diagnosis present

## 2021-01-11 DIAGNOSIS — E663 Overweight: Secondary | ICD-10-CM | POA: Diagnosis present

## 2021-01-11 DIAGNOSIS — D62 Acute posthemorrhagic anemia: Secondary | ICD-10-CM | POA: Diagnosis present

## 2021-01-11 DIAGNOSIS — M170 Bilateral primary osteoarthritis of knee: Secondary | ICD-10-CM | POA: Diagnosis present

## 2021-01-11 DIAGNOSIS — Z79890 Hormone replacement therapy (postmenopausal): Secondary | ICD-10-CM

## 2021-01-11 DIAGNOSIS — I5021 Acute systolic (congestive) heart failure: Secondary | ICD-10-CM | POA: Diagnosis present

## 2021-01-11 DIAGNOSIS — R7401 Elevation of levels of liver transaminase levels: Secondary | ICD-10-CM | POA: Diagnosis present

## 2021-01-11 DIAGNOSIS — Z87891 Personal history of nicotine dependence: Secondary | ICD-10-CM | POA: Diagnosis not present

## 2021-01-11 DIAGNOSIS — Z881 Allergy status to other antibiotic agents status: Secondary | ICD-10-CM

## 2021-01-11 DIAGNOSIS — Z8673 Personal history of transient ischemic attack (TIA), and cerebral infarction without residual deficits: Secondary | ICD-10-CM | POA: Diagnosis not present

## 2021-01-11 DIAGNOSIS — I2510 Atherosclerotic heart disease of native coronary artery without angina pectoris: Secondary | ICD-10-CM | POA: Diagnosis present

## 2021-01-11 DIAGNOSIS — D6959 Other secondary thrombocytopenia: Secondary | ICD-10-CM | POA: Diagnosis present

## 2021-01-11 DIAGNOSIS — R570 Cardiogenic shock: Secondary | ICD-10-CM | POA: Diagnosis present

## 2021-01-11 DIAGNOSIS — R2689 Other abnormalities of gait and mobility: Secondary | ICD-10-CM | POA: Diagnosis present

## 2021-01-11 DIAGNOSIS — R748 Abnormal levels of other serum enzymes: Secondary | ICD-10-CM | POA: Diagnosis present

## 2021-01-11 DIAGNOSIS — I071 Rheumatic tricuspid insufficiency: Secondary | ICD-10-CM | POA: Diagnosis present

## 2021-01-11 DIAGNOSIS — G8918 Other acute postprocedural pain: Secondary | ICD-10-CM | POA: Diagnosis not present

## 2021-01-11 DIAGNOSIS — I255 Ischemic cardiomyopathy: Secondary | ICD-10-CM | POA: Diagnosis present

## 2021-01-11 DIAGNOSIS — R0989 Other specified symptoms and signs involving the circulatory and respiratory systems: Secondary | ICD-10-CM | POA: Diagnosis present

## 2021-01-11 DIAGNOSIS — Z7982 Long term (current) use of aspirin: Secondary | ICD-10-CM

## 2021-01-11 DIAGNOSIS — Z7901 Long term (current) use of anticoagulants: Secondary | ICD-10-CM

## 2021-01-11 DIAGNOSIS — E876 Hypokalemia: Secondary | ICD-10-CM | POA: Diagnosis not present

## 2021-01-11 DIAGNOSIS — N179 Acute kidney failure, unspecified: Secondary | ICD-10-CM | POA: Diagnosis present

## 2021-01-11 DIAGNOSIS — D696 Thrombocytopenia, unspecified: Secondary | ICD-10-CM | POA: Diagnosis present

## 2021-01-11 DIAGNOSIS — I447 Left bundle-branch block, unspecified: Secondary | ICD-10-CM | POA: Diagnosis present

## 2021-01-11 DIAGNOSIS — Z6828 Body mass index (BMI) 28.0-28.9, adult: Secondary | ICD-10-CM

## 2021-01-11 DIAGNOSIS — Z79899 Other long term (current) drug therapy: Secondary | ICD-10-CM

## 2021-01-11 DIAGNOSIS — I951 Orthostatic hypotension: Secondary | ICD-10-CM | POA: Diagnosis not present

## 2021-01-11 DIAGNOSIS — E785 Hyperlipidemia, unspecified: Secondary | ICD-10-CM | POA: Diagnosis present

## 2021-01-11 DIAGNOSIS — Z741 Need for assistance with personal care: Secondary | ICD-10-CM | POA: Diagnosis present

## 2021-01-11 DIAGNOSIS — Z820 Family history of epilepsy and other diseases of the nervous system: Secondary | ICD-10-CM

## 2021-01-11 DIAGNOSIS — E039 Hypothyroidism, unspecified: Secondary | ICD-10-CM | POA: Diagnosis present

## 2021-01-11 DIAGNOSIS — K7200 Acute and subacute hepatic failure without coma: Secondary | ICD-10-CM | POA: Diagnosis present

## 2021-01-11 DIAGNOSIS — Z880 Allergy status to penicillin: Secondary | ICD-10-CM

## 2021-01-11 LAB — CBC
HCT: 29.4 % — ABNORMAL LOW (ref 36.0–46.0)
Hemoglobin: 9.3 g/dL — ABNORMAL LOW (ref 12.0–15.0)
MCH: 30 pg (ref 26.0–34.0)
MCHC: 31.6 g/dL (ref 30.0–36.0)
MCV: 94.8 fL (ref 80.0–100.0)
Platelets: 395 10*3/uL (ref 150–400)
RBC: 3.1 MIL/uL — ABNORMAL LOW (ref 3.87–5.11)
RDW: 15.3 % (ref 11.5–15.5)
WBC: 5.9 10*3/uL (ref 4.0–10.5)
nRBC: 0 % (ref 0.0–0.2)

## 2021-01-11 LAB — COMPREHENSIVE METABOLIC PANEL
ALT: 61 U/L — ABNORMAL HIGH (ref 0–44)
AST: 39 U/L (ref 15–41)
Albumin: 2 g/dL — ABNORMAL LOW (ref 3.5–5.0)
Alkaline Phosphatase: 79 U/L (ref 38–126)
Anion gap: 6 (ref 5–15)
BUN: 10 mg/dL (ref 8–23)
CO2: 25 mmol/L (ref 22–32)
Calcium: 8.2 mg/dL — ABNORMAL LOW (ref 8.9–10.3)
Chloride: 104 mmol/L (ref 98–111)
Creatinine, Ser: 0.63 mg/dL (ref 0.44–1.00)
GFR, Estimated: >60 mL/min (ref >60)
Glucose, Bld: 82 mg/dL (ref 70–99)
Potassium: 4 mmol/L (ref 3.5–5.1)
Sodium: 135 mmol/L (ref 135–145)
Total Bilirubin: 1.1 mg/dL (ref 0.3–1.2)
Total Protein: 5 g/dL — ABNORMAL LOW (ref 6.5–8.1)

## 2021-01-11 LAB — COOXEMETRY PANEL
Carboxyhemoglobin: 2 % — ABNORMAL HIGH (ref 0.5–1.5)
Methemoglobin: 0.8 % (ref 0.0–1.5)
O2 Saturation: 60.2 %
Total hemoglobin: 10 g/dL — ABNORMAL LOW (ref 12.0–16.0)

## 2021-01-11 LAB — MAGNESIUM: Magnesium: 2.1 mg/dL (ref 1.7–2.4)

## 2021-01-11 MED ORDER — OXYCODONE-ACETAMINOPHEN 5-325 MG PO TABS: 1.0000 | ORAL_TABLET | Freq: Four times a day (QID) | ORAL | Status: DC | PRN

## 2021-01-11 MED ORDER — DIGOXIN 125 MCG PO TABS: 0.1250 mg | ORAL_TABLET | Freq: Every day | ORAL | Status: DC

## 2021-01-11 MED ORDER — SENNA 8.6 MG PO TABS
2.0000 | ORAL_TABLET | Freq: Two times a day (BID) | ORAL | Status: DC
  Administered 2021-01-11 – 2021-01-15 (×9): 17.2 mg via ORAL
  Administered 2021-01-17: 8.6 mg via ORAL
  Administered 2021-01-18 – 2021-01-28 (×8): 17.2 mg via ORAL
  Filled 2021-01-11 (×29): qty 2

## 2021-01-11 MED ORDER — POLYETHYLENE GLYCOL 3350 17 G PO PACK: 17.0000 g | PACK | Freq: Every day | ORAL | Status: DC | PRN

## 2021-01-11 MED ORDER — PROCHLORPERAZINE EDISYLATE 10 MG/2ML IJ SOLN: 5.0000 mg | Freq: Four times a day (QID) | INTRAMUSCULAR | Status: DC | PRN

## 2021-01-11 MED ORDER — GUAIFENESIN-DM 100-10 MG/5ML PO SYRP: 5.0000 mL | ORAL_SOLUTION | Freq: Four times a day (QID) | ORAL | Status: DC | PRN

## 2021-01-11 MED ORDER — EZETIMIBE 10 MG PO TABS
10.0000 mg | ORAL_TABLET | Freq: Every day | ORAL | Status: DC
  Administered 2021-01-12 – 2021-01-28 (×17): 10 mg via ORAL
  Filled 2021-01-11 (×17): qty 1

## 2021-01-11 MED ORDER — SODIUM CHLORIDE 0.9% FLUSH: 10.0000 mL | INTRAVENOUS | Status: DC | PRN

## 2021-01-11 MED ORDER — LOSARTAN POTASSIUM 25 MG PO TABS
12.5000 mg | ORAL_TABLET | Freq: Every day | ORAL | Status: DC
  Administered 2021-01-12 – 2021-01-13 (×2): 12.5 mg via ORAL
  Filled 2021-01-11 (×2): qty 0.5

## 2021-01-11 MED ORDER — DIPHENHYDRAMINE HCL 12.5 MG/5ML PO ELIX
12.5000 mg | ORAL_SOLUTION | Freq: Four times a day (QID) | ORAL | Status: DC | PRN
Start: 2021-01-11 — End: 2021-01-28

## 2021-01-11 MED ORDER — MAGNESIUM HYDROXIDE 400 MG/5ML PO SUSP
30.0000 mL | Freq: Every day | ORAL | Status: DC
  Administered 2021-01-12 – 2021-01-25 (×11): 30 mL via ORAL
  Filled 2021-01-11 (×14): qty 30

## 2021-01-11 MED ORDER — ALUM & MAG HYDROXIDE-SIMETH 200-200-20 MG/5ML PO SUSP: 30.0000 mL | ORAL | Status: DC | PRN

## 2021-01-11 MED ORDER — CHLORHEXIDINE GLUCONATE CLOTH 2 % EX PADS
6.0000 | MEDICATED_PAD | Freq: Every day | CUTANEOUS | Status: DC
  Administered 2021-01-12 – 2021-01-15 (×4): 6 via TOPICAL

## 2021-01-11 MED ORDER — BISACODYL 10 MG RE SUPP: 10.0000 mg | Freq: Every day | RECTAL | Status: DC | PRN

## 2021-01-11 MED ORDER — ENSURE ENLIVE PO LIQD
237.0000 mL | Freq: Two times a day (BID) | ORAL | Status: DC
  Administered 2021-01-12 – 2021-01-28 (×26): 237 mL via ORAL

## 2021-01-11 MED ORDER — DOCUSATE SODIUM 100 MG PO CAPS
200.0000 mg | ORAL_CAPSULE | Freq: Every day | ORAL | Status: DC
  Administered 2021-01-12 – 2021-01-28 (×16): 200 mg via ORAL
  Filled 2021-01-11 (×17): qty 2

## 2021-01-11 MED ORDER — EZETIMIBE 10 MG PO TABS: 10.0000 mg | ORAL_TABLET | Freq: Every day | ORAL | Status: DC

## 2021-01-11 MED ORDER — PROCHLORPERAZINE 25 MG RE SUPP: 12.5000 mg | Freq: Four times a day (QID) | RECTAL | Status: DC | PRN

## 2021-01-11 MED ORDER — ASPIRIN EC 81 MG PO TBEC
81.0000 mg | DELAYED_RELEASE_TABLET | Freq: Every day | ORAL | Status: DC
  Administered 2021-01-12 – 2021-01-28 (×17): 81 mg via ORAL
  Filled 2021-01-11 (×17): qty 1

## 2021-01-11 MED ORDER — SODIUM CHLORIDE 0.9% FLUSH
10.0000 mL | Freq: Two times a day (BID) | INTRAVENOUS | Status: DC
  Administered 2021-01-12 – 2021-01-19 (×12): 10 mL

## 2021-01-11 MED ORDER — ENOXAPARIN SODIUM 40 MG/0.4ML IJ SOSY
40.0000 mg | PREFILLED_SYRINGE | INTRAMUSCULAR | Status: DC
  Administered 2021-01-12 – 2021-01-28 (×17): 40 mg via SUBCUTANEOUS
  Filled 2021-01-11 (×16): qty 0.4

## 2021-01-11 MED ORDER — LIP MEDEX EX OINT
TOPICAL_OINTMENT | CUTANEOUS | Status: DC | PRN
  Filled 2021-01-11: qty 7

## 2021-01-11 MED ORDER — ATORVASTATIN CALCIUM 80 MG PO TABS
80.0000 mg | ORAL_TABLET | Freq: Every day | ORAL | Status: DC
  Administered 2021-01-12 – 2021-01-28 (×17): 80 mg via ORAL
  Filled 2021-01-11 (×17): qty 1

## 2021-01-11 MED ORDER — LOSARTAN POTASSIUM 25 MG PO TABS: 12.5000 mg | ORAL_TABLET | Freq: Every day | ORAL | Status: DC

## 2021-01-11 MED ORDER — SALINE SPRAY 0.65 % NA SOLN
1.0000 | NASAL | Status: DC | PRN
  Filled 2021-01-11: qty 44

## 2021-01-11 MED ORDER — SPIRONOLACTONE 25 MG PO TABS
25.0000 mg | ORAL_TABLET | Freq: Every day | ORAL | Status: DC
  Administered 2021-01-12 – 2021-01-13 (×2): 25 mg via ORAL
  Filled 2021-01-11 (×3): qty 1

## 2021-01-11 MED ORDER — FLEET ENEMA 7-19 GM/118ML RE ENEM
1.0000 | ENEMA | Freq: Once | RECTAL | Status: DC | PRN
Start: 2021-01-11 — End: 2021-01-28

## 2021-01-11 MED ORDER — TICAGRELOR 90 MG PO TABS
90.0000 mg | ORAL_TABLET | Freq: Two times a day (BID) | ORAL | Status: DC
  Administered 2021-01-11 – 2021-01-28 (×34): 90 mg via ORAL
  Filled 2021-01-11 (×34): qty 1

## 2021-01-11 MED ORDER — TICAGRELOR 90 MG PO TABS: 90.0000 mg | ORAL_TABLET | Freq: Two times a day (BID) | ORAL | Status: DC

## 2021-01-11 MED ORDER — THIAMINE HCL 100 MG PO TABS
100.0000 mg | ORAL_TABLET | Freq: Every day | ORAL | Status: DC
  Administered 2021-01-12 – 2021-01-28 (×17): 100 mg via ORAL
  Filled 2021-01-11 (×17): qty 1

## 2021-01-11 MED ORDER — MELATONIN 3 MG PO TABS: 3.0000 mg | ORAL_TABLET | Freq: Every evening | ORAL | Status: DC | PRN

## 2021-01-11 MED ORDER — PROCHLORPERAZINE MALEATE 5 MG PO TABS: 5.0000 mg | ORAL_TABLET | Freq: Four times a day (QID) | ORAL | Status: DC | PRN

## 2021-01-11 MED ORDER — DIGOXIN 125 MCG PO TABS
0.1250 mg | ORAL_TABLET | Freq: Every day | ORAL | Status: DC
  Administered 2021-01-12 – 2021-01-13 (×2): 0.125 mg via ORAL
  Filled 2021-01-11 (×2): qty 1

## 2021-01-11 MED ORDER — LEVOTHYROXINE SODIUM 100 MCG PO TABS
100.0000 ug | ORAL_TABLET | Freq: Every day | ORAL | Status: DC
  Administered 2021-01-12 – 2021-01-28 (×17): 100 ug via ORAL
  Filled 2021-01-11 (×17): qty 1

## 2021-01-11 MED ORDER — SPIRONOLACTONE 25 MG PO TABS: 25.0000 mg | ORAL_TABLET | Freq: Every day | ORAL | Status: DC

## 2021-01-11 MED ORDER — ACETAMINOPHEN 325 MG PO TABS
325.0000 mg | ORAL_TABLET | ORAL | Status: DC | PRN
  Administered 2021-01-14 – 2021-01-20 (×6): 650 mg via ORAL
  Filled 2021-01-11 (×6): qty 2

## 2021-01-11 NOTE — Progress Notes (Signed)
Patient arrived to the unit accompanied by staff and daughter. Patient oriented to the unit and assigned room. Patient in a pleasant mood. Alert and oriented x4, denies pain. No signs of distress noted. Admission complete. Bed in lowest position call bell within reach.

## 2021-01-11 NOTE — Progress Notes (Signed)
Physical Therapy Treatment Patient Details Name: Jane Navarro MRN: [id number] DOB: [birth year] Today's Date: 01/11/2021   History of Present Illness Pt is a 75 y.o. female admitted 12/29/20 with NSTEMI, cardiogenic shock. 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 S/p CP impella placed 9/27-10/3. S/p PCI to RCA DES x3 on 9/30; course complicated by hypotension, post-op bleeding in groin. PMH includes anemia, CVA, tobacco use; of note, recent admission 12/05/20 after pt found passed out in car in [employer] parking lot, found to have severe hypothyroidism.    PT Comments    The pt was eager to participate in session this AM, presents with significant regression in strength and muscular endurance this session. She as able to complete bed mobility with minA and increased time, but requires at least minA of 2 to complete all OOB mobility. The pt vital signs remained stable throughout, but she was limited to 3-5 ft of ambulation at a time prior to needing seated rest due to fatigue. The pt also continues to demo poor dynamic stability in standing, requiring modA due to posterior lean during static stance and gait. Given current deficits, I continue to strongly recommend CIR therapies to allow pt to achieve maximal functional recovery and return to prior level of independence.    Recommendations for follow up therapy are one component of a multi-disciplinary discharge planning process, led by the attending physician.  Recommendations may be updated based on patient status, additional functional criteria and insurance authorization.  Follow Up Recommendations  CIR;Supervision for mobility/OOB     Equipment Recommendations  None recommended by PT    Recommendations for Other Services       Precautions / Restrictions Precautions Precautions: Fall Restrictions Weight Bearing Restrictions: No RLE Weight Bearing: Weight bearing as tolerated LLE Weight Bearing: Weight bearing as tolerated Other  Position/Activity Restrictions: be aware of L knee pain with bearing weight     Mobility  Bed Mobility Overal bed mobility: Needs Assistance Bed Mobility: Rolling;Sidelying to Sit;Sit to Sidelying Rolling: Min assist Sidelying to sit: Min assist     Sit to sidelying: Mod assist General bed mobility comments: minA to complete rolling in either direction or to elevate trunk from [facility]. modA to bring BLE back into bed    Transfers Overall transfer level: Needs assistance Equipment used: Rolling walker (2 wheeled) Transfers: Sit to/from [employer] Sit to Stand: Min assist;+2 physical assistance;+2 safety/equipment Stand pivot transfers: Min assist;+2 safety/equipment;+2 physical assistance       General transfer comment: minA to power up to standing, pt more confident with +2, cues for wt through toes and anterior wt shift. pt leaning backwards and needing modA to maintain upright until wt shift forward. minA to complete pivoting steps until pt fatigued, then increased urgency and decreased command following  Ambulation/Gait Ambulation/Gait assistance: Min assist;+2 physical assistance;+2 safety/equipment (chair follow) Gait Distance (Feet): 3 Feet (+ 4 ft) Assistive device: Rolling walker (2 wheeled) Gait Pattern/deviations: Step-to pattern;Decreased stride length;Shuffle;Trunk flexed;Narrow base of support Gait velocity: decreased Gait velocity interpretation: <1.31 ft/sec, indicative of household ambulator General Gait Details: small forward steps with pt needing modA to maintain posture (posterior lean). LOB backwards with any decrease in posterior support      Balance Overall balance assessment: Needs assistance Sitting-balance support: Feet supported Sitting balance-Leahy Scale: Fair Sitting balance - Comments: minG sitting at EOB Postural control: Posterior lean Standing balance support: Bilateral upper extremity supported Standing balance-Leahy Scale:  Poor Standing balance comment: UE support and assist for balance  Cognition Arousal/Alertness: Awake/alert Behavior During Therapy: WFL for tasks assessed/performed Overall Cognitive Status: Within Functional Limits for tasks assessed                                 General Comments: pt following commands appropriately, seems slightly more fearful of movement/activity at this session      Exercises Other Exercises Other Exercises: shoulder rolls x 10 Other Exercises: scapular retractions x 10    General Comments General comments (skin integrity, edema, etc.): VSS on RA through session      Pertinent Vitals/Pain Pain Assessment: No/denies pain Pain Intervention(s): Monitored during session     PT Goals (current goals can now be found in the care plan section) Acute Rehab PT Goals Patient Stated Goal: Get back home to dog; likely move in with son at d/c PT Goal Formulation: With patient/family Time For Goal Achievement: 01/19/21 Potential to Achieve Goals: Good Progress towards PT goals: Progressing toward goals    Frequency    Min 3X/week      PT Plan Current plan remains appropriate       AM-PAC PT "6 Clicks" Mobility   Outcome Measure  Help needed turning from your back to your side while in a flat bed without using bedrails?: A Little Help needed moving from lying on your back to sitting on the side of a flat bed without using bedrails?: A Little Help needed moving to and from a bed to a chair (including a wheelchair)?: A Lot Help needed standing up from a chair using your arms (e.g., wheelchair or bedside chair)?: A Little Help needed to walk in hospital room?: A Lot Help needed climbing 3-5 steps with a railing? : Total 6 Click Score: 14    End of Session Equipment Utilized During Treatment: Gait belt Activity Tolerance: Patient limited by fatigue Patient left: in bed;with call bell/phone within  reach;with family/visitor present (RN requesting pt not left in recliner) Nurse Communication: Mobility status PT Visit Diagnosis: Other abnormalities of gait and mobility (R26.89);Muscle weakness (generalized) (M62.81)     Time: 6503-5465 PT Time Calculation (min) (ACUTE ONLY): 34 min  Charges:  $Gait Training: 8-22 mins $Therapeutic Activity: 8-22 mins                     Jane Navarro, PT, DPT   Acute Rehabilitation Department Pager #: [phone]   Jane Navarro 01/11/2021, 10:39 AM

## 2021-01-11 NOTE — Progress Notes (Signed)
Inpatient Rehabilitation Medication Review by a Pharmacist  A complete drug regimen review was completed for this patient to identify any potential clinically significant medication issues.  High Risk Drug Classes Is patient taking? Indication by Medication  Antipsychotic No   Anticoagulant No   Antibiotic No   Opioid Yes Oxycodone/acetaminophen PRN for pain  Antiplatelet Yes Aspirin/ticagrelor s/p NSTEMI  Hypoglycemics/insulin No   Vasoactive Medication Yes Digoxin, spironolactone, losartan for HF, Atorvastatin/Zetia for CAD s/p NSTEMI  Chemotherapy No   Other Yes Levothyroxine for hypothyroidism, thiamine, Ensure nutritional supplement, Voltaren gel for pain, docusate/senna/milk of magnesia for constipation     Type of Medication Issue Identified Description of Issue Recommendation(s)  Drug Interaction(s) (clinically significant)     Duplicate Therapy     Allergy     No Medication Administration End Date     Incorrect Dose     Additional Drug Therapy Needed     Significant med changes from prior encounter (inform family/care partners about these prior to discharge).    Other       Clinically significant medication issues were identified that warrant physician communication and completion of prescribed/recommended actions by midnight of the next day:  No  Name of provider notified for urgent issues identified:   Provider Method of Notification:     Pharmacist comments:   Time spent performing this drug regimen review (minutes):  15   Leia Alf, PharmD, BCPS Please check AMION for all [facility] contact numbers Clinical Pharmacist 01/11/2021 5:54 PM

## 2021-01-11 NOTE — Progress Notes (Signed)
Discharged to 4 west by bed, belongings with daughter, report given to LPN. ERICA.

## 2021-01-11 NOTE — Progress Notes (Signed)
Inpatient Rehab Admissions Coordinator:    I have insurance approval and a bed available for pt to admit to CIR today. Dr. Gala Romney in agreement.  Will let pt/family and TOC team know.   Estill Dooms, PT, DPT Admissions Coordinator [phone] 01/11/21  10:40 AM

## 2021-01-11 NOTE — Progress Notes (Signed)
PMR Admission Coordinator Pre-Admission Assessment  Patient: Jane Navarro is an 75 y.o., female MRN: [id number] DOB: 08/08/1945 Height: 5' (152.4 cm) Weight: 68.4 kg  Insurance Information HMO: yes    PPO:      PCP:      IPA:      80/20:      OTHER:  PRIMARY: [employer]      Policy#: [id number]      Subscriber: pt CM Name: Dot      Phone#: [phone]     Fax#: [phone] Pre-Cert#: [id number] auth for CIR given by Dot with [employer] with updates due to Pat E on day 7 (10/17) at fax listed above.  Pat's extension is [phone] at phone above.       Employer:  Benefits:  Phone #: [phone]     Name:  Eff. Date: 04/05/19     Deduct: $0      Out of Pocket Max: $3900 (met $2347.15)      Life Max: n/a CIR: $295/day for days 1-6      SNF: 20 full days Outpatient:      Co-Pay: $10-$20/visit Home Health: 100%      Co-Pay:  DME: 80%     Co-Ins: 20% Providers:  SECONDARY:       Policy#:      Phone#:   Financial Counselor:       Phone#:   The "Data Collection Information Summary" for patients in Inpatient Rehabilitation Facilities with attached "Privacy Act Statement-Health Care Records" was provided and verbally reviewed with: Family  Emergency Contact Information Contact Information     Name Relation Home Work Mobile   Koon,Bryan Son [phone]     Beck,Lisa Daughter   [phone]       Current Medical History  Patient Admitting Diagnosis: debility following multiple medical problems including NSTEMI (9/26) and CVA (9/7)  History of Present Illness: pt is a 74 y/o female admitted to [facility] on 9/26 with c/o weakness.  Previously admitted in early September for a CVA.  PMH significant for hypothyroidism, HTN, CKD stage IIIa, and remote history of tobacco use.  In ED RR ranged 16-36, soft BPs with systolic 90-110, O2 on room air at 90%.  EKG showed new left BBB.  Labs from 9/26 significant for WBC 13.6, hemoglobin 8.7 with MCV 101, alkaline phosphatase 279, AST 2238,  ALT 1270, total bilirubin 1.9, BNP >4500, and high-sensitivity troponin 7477->7263.  Chest x-ray noted blunting of the bilateral costophrenic angles with likely trace pleural effusions. Urinalysis was positive for small leukocytes, positive nitrites, many bacteria, and 6-10 WBCs.  Cardiology consulted and note labs concerning for cardiogenic shock, recommend stat echo which shows newly reduced LV and RV function, focal wall motion abnormalities, and moderate MR/TR.  She underwent urgent L/R heart cath on 9/27 and became hypotensive/acidotic.  She was given bicarb x2, started on milrinone, and had impella placed. She was deemed a poor candidate for surgery, so underwent staged multivessel PCI.   Subsequently she developed hypotension in the ICU with evidence of bleed at the site and so Critical Care was consulted.  Recommended initiation of vasopressors and PRBCs, continue milrinone and following labs.  Impella was removed on 10/3.  Required brief use of levophed again overnight on 10/4.  Therapy evaluations were completed and pt was recommended for CIR.     Patient's medical record from [facility] has been reviewed by the rehabilitation admission coordinator and physician.  Past Medical History  Past Medical History:    Diagnosis Date   Anemia    CVA (cerebral vascular accident) (HCC)    HLD (hyperlipidemia)    Hypothyroidism    Tobacco abuse, in remission     Has the patient had major surgery during 100 days prior to admission? Yes  Family History   family history includes Breast cancer in her mother; Dementia in her mother; Parkinson's disease in her father; Transient ischemic attack in her mother.  Current Medications  Current Facility-Administered Medications:    acetaminophen (TYLENOL) tablet 650 mg, 650 mg, Oral, Q4H PRN, Clegg, Amy D, NP, 650 mg at 01/09/21 2100   aspirin EC tablet 81 mg, 81 mg, Oral, Daily, Clegg, Amy D, NP, 81 mg at 01/11/21 0946   atorvastatin (LIPITOR) tablet 80 mg,  80 mg, Oral, Daily, Clegg, Amy D, NP, 80 mg at 01/11/21 0949   chlorhexidine (PERIDEX) 0.12 % solution 15 mL, 15 mL, Mouth Rinse, BID, Clegg, Amy D, NP, 15 mL at 01/11/21 0946   Chlorhexidine Gluconate Cloth 2 % PADS 6 each, 6 each, Topical, Daily, Clegg, Amy D, NP, 6 each at 01/11/21 0951   digoxin (LANOXIN) tablet 0.125 mg, 0.125 mg, Oral, Daily, Clegg, Amy D, NP, 0.125 mg at 01/11/21 0945   docusate sodium (COLACE) capsule 200 mg, 200 mg, Oral, Daily, Clegg, Amy D, NP, 200 mg at 01/11/21 0943   enoxaparin (LOVENOX) injection 40 mg, 40 mg, Subcutaneous, Q24H, Clegg, Amy D, NP, 40 mg at 01/11/21 0948   ezetimibe (ZETIA) tablet 10 mg, 10 mg, Oral, Daily, Clegg, Amy D, NP, 10 mg at 01/11/21 0944   feeding supplement (ENSURE ENLIVE / ENSURE PLUS) liquid 237 mL, 237 mL, Oral, BID BM, Clegg, Amy D, NP, 237 mL at 01/10/21 0955   fentaNYL (SUBLIMAZE) injection 25 mcg, 25 mcg, Intravenous, Q1H PRN, Clegg, Amy D, NP, 25 mcg at 01/01/21 2053   levothyroxine (SYNTHROID) tablet 100 mcg, 100 mcg, Oral, Q0600, Clegg, Amy D, NP, 100 mcg at 01/11/21 0648   lip balm (CARMEX) ointment, , Topical, PRN, Clegg, Amy D, NP, 75 application at 01/03/21 0953   losartan (COZAAR) tablet 12.5 mg, 12.5 mg, Oral, Daily, Clegg, Amy D, NP, 12.5 mg at 01/11/21 0945   magnesium hydroxide (MILK OF MAGNESIA) suspension 30 mL, 30 mL, Oral, Daily, Bensimhon, Daniel R, MD, 30 mL at 01/11/21 0955   MEDLINE mouth rinse, 15 mL, Mouth Rinse, q12n4p, Clegg, Amy D, NP, 15 mL at 01/10/21 1150   ondansetron (ZOFRAN) injection 4 mg, 4 mg, Intravenous, Q6H PRN, Clegg, Amy D, NP   oxyCODONE-acetaminophen (PERCOCET/ROXICET) 5-325 MG per tablet 1 tablet, 1 tablet, Oral, Q6H PRN, Clegg, Amy D, NP, 1 tablet at 01/07/21 1753   polyethylene glycol (MIRALAX / GLYCOLAX) packet 17 g, 17 g, Oral, Daily, Clegg, Amy D, NP, 17 g at 01/11/21 0947   senna (SENOKOT) tablet 17.2 mg, 2 tablet, Oral, Daily, Clegg, Amy D, NP, 17.2 mg at 01/11/21 0945   sodium  chloride (OCEAN) 0.65 % nasal spray 1 spray, 1 spray, Each Nare, PRN, Clegg, Amy D, NP   sodium chloride flush (NS) 0.9 % injection 10-40 mL, 10-40 mL, Intracatheter, Q12H, Clegg, Amy D, NP, 10 mL at 01/11/21 0948   sodium chloride flush (NS) 0.9 % injection 10-40 mL, 10-40 mL, Intracatheter, PRN, Clegg, Amy D, NP   spironolactone (ALDACTONE) tablet 25 mg, 25 mg, Oral, Daily, Clegg, Amy D, NP, 25 mg at 01/11/21 0944   thiamine tablet 100 mg, 100 mg, Oral, Daily, Clegg, Amy D, NP, 100   mg at 01/11/21 0944   [COMPLETED] ticagrelor (BRILINTA) tablet 180 mg, 180 mg, Oral, Once, 180 mg at 12/31/20 1003 **FOLLOWED BY** ticagrelor (BRILINTA) tablet 90 mg, 90 mg, Oral, BID, Clegg, Amy D, NP, 90 mg at 01/11/21 0943  Patients Current Diet:  Diet Order             Diet Heart Room service appropriate? Yes; Fluid consistency: Thin  Diet effective now                   Precautions / Restrictions Precautions Precautions: Fall Precaution Comments: Multiple lines, bilateral groin wounds Other Brace: endorses baseline vertigo, pt wears neoprene type sleeve on B knees due to arthritic pain Restrictions Weight Bearing Restrictions: No RLE Weight Bearing: Weight bearing as tolerated LLE Weight Bearing: Weight bearing as tolerated Other Position/Activity Restrictions: be aware of L knee pain with bearing weight   Has the patient had 2 or more falls or a fall with injury in the past year? Yes  Prior Activity Level Limited Community (1-2x/wk): independent with mobility prior to previous admit in early September, mod I with RW,  Prior Functional Level Self Care: Did the patient need help bathing, dressing, using the toilet or eating? Independent  Indoor Mobility: Did the patient need assistance with walking from room to room (with or without device)? Independent  Stairs: Did the patient need assistance with internal or external stairs (with or without device)? Independent  Functional Cognition: Did  the patient need help planning regular tasks such as shopping or remembering to take medications? Independent  Patient Information Are you of Hispanic, Latino/a,or Spanish origin?: A. No, not of Hispanic, Latino/a, or Spanish origin What is your race?: A. White Do you need or want an interpreter to communicate with a doctor or health care staff?: 0. No  Patient's Response To:  Health Literacy and Transportation Is the patient able to respond to health literacy and transportation needs?: Yes Health Literacy - How often do you need to have someone help you when you read instructions, pamphlets, or other written material from your doctor or pharmacy?: Sometimes In the past 12 months, has lack of transportation kept you from medical appointments or from getting medications?: No In the past 12 months, has lack of transportation kept you from meetings, work, or from getting things needed for daily living?: No  Home Assistive Devices / Equipment Home Assistive Devices/Equipment: Walker (specify type) Home Equipment: Walker - 2 wheels, Toilet riser, Transport chair  Prior Device Use: Indicate devices/aids used by the patient prior to current illness, exacerbation or injury? Walker  Current Functional Level Cognition  Overall Cognitive Status: Within Functional Limits for tasks assessed Orientation Level: Oriented X4 General Comments: pt following commands appropriately, seems slightly more fearful of movement/activity at this session    Extremity Assessment (includes Sensation/Coordination)  Upper Extremity Assessment: Generalized weakness  Lower Extremity Assessment: Defer to PT evaluation    ADLs  Overall ADL's : Needs assistance/impaired Eating/Feeding: Set up, Sitting Grooming: Wash/dry face, Min guard, Sitting Grooming Details (indicate cue type and reason): EOB Upper Body Bathing: Sitting, Minimal assistance Lower Body Bathing: Sit to/from stand, Moderate assistance Lower Body  Bathing Details (indicate cue type and reason): knees down Upper Body Dressing : Minimal assistance, Sitting Upper Body Dressing Details (indicate cue type and reason): to don extra gown like robe Lower Body Dressing: Maximal assistance, Sitting/lateral leans Lower Body Dressing Details (indicate cue type and reason): to don socks Toilet Transfer: Minimal assistance, +2 for   physical assistance, +2 for safety/equipment (EVA walker) Toilet Transfer Details (indicate cue type and reason): short pivot ambulation, fatigues quickly Toileting- Clothing Manipulation and Hygiene: Maximal assistance, Sit to/from stand Functional mobility during ADLs: Minimal assistance, +2 for safety/equipment (EVA walker) General ADL Comments: demonstrating increased activity tolerance and continued motivation    Mobility  Overal bed mobility: Needs Assistance Bed Mobility: Rolling, Sidelying to Sit, Sit to Sidelying Rolling: Min assist Sidelying to sit: Min assist Supine to sit: Min assist, +2 for safety/equipment, HOB elevated Sit to supine: Mod assist Sit to sidelying: Mod assist General bed mobility comments: minA to complete rolling in either direction or to elevate trunk from HOB. modA to bring BLE back into bed    Transfers  Overall transfer level: Needs assistance Equipment used: Rolling walker (2 wheeled) Transfers: Sit to/from Stand, Stand Pivot Transfers Sit to Stand: Min assist, +2 physical assistance, +2 safety/equipment Stand pivot transfers: Min assist, +2 safety/equipment, +2 physical assistance General transfer comment: minA to power up to standing, pt more confident with +2, cues for wt through toes and anterior wt shift. pt leaning backwards and needing modA to maintain upright until wt shift forward. minA to complete pivoting steps until pt fatigued, then increased urgency and decreased command following    Ambulation / Gait / Stairs / Wheelchair Mobility  Ambulation/Gait Ambulation/Gait  assistance: Min assist, +2 physical assistance, +2 safety/equipment (chair follow) Gait Distance (Feet): 3 Feet (+ 4 ft) Assistive device: Rolling walker (2 wheeled) Gait Pattern/deviations: Step-to pattern, Decreased stride length, Shuffle, Trunk flexed, Narrow base of support General Gait Details: small forward steps with pt needing modA to maintain posture (posterior lean). LOB backwards with any decrease in posterior support Gait velocity: decreased Gait velocity interpretation: <1.31 ft/sec, indicative of household ambulator    Posture / Balance Dynamic Sitting Balance Sitting balance - Comments: minG sitting at EOB Balance Overall balance assessment: Needs assistance Sitting-balance support: Feet supported Sitting balance-Leahy Scale: Fair Sitting balance - Comments: minG sitting at EOB Postural control: Posterior lean Standing balance support: Bilateral upper extremity supported Standing balance-Leahy Scale: Poor Standing balance comment: UE support and assist for balance    Special needs/care consideration Skin dehiscence of R groin wound.    Previous Home Environment (from acute therapy documentation) Living Arrangements: Children Available Help at Discharge: Family, Available PRN/intermittently Type of Home: House Home Layout: One level Home Access: Stairs to enter (son can build ramp) Entrance Stairs-Rails: None Entrance Stairs-Number of Steps: 3-4 Bathroom Shower/Tub: Tub/shower unit Bathroom Toilet: Standard Home Care Services: No ("trying to get some though") Additional Comments: Likely plan to d/c to son's home as family does not want pt living alone  Discharge Living Setting Plans for Discharge Living Setting: Lives with (comment) (son, Bryan) Type of Home at Discharge: House Discharge Home Layout: One level Discharge Home Access: Stairs to enter (can build ramp) Entrance Stairs-Rails: None Entrance Stairs-Number of Steps: 3-4 Discharge Bathroom Shower/Tub:  Tub/shower unit Discharge Bathroom Toilet: Standard Discharge Bathroom Accessibility: Yes How Accessible: Accessible via walker Does the patient have any problems obtaining your medications?: No  Social/Family/Support Systems Anticipated Caregiver: Lisa and Bryan (pt's daughter and son) Anticipated Caregiver's Contact Information: Lisa [phone], Bryan Capetillo [phone] Ability/Limitations of Caregiver: n/a Caregiver Availability: 24/7 Discharge Plan Discussed with Primary Caregiver: Yes Is Caregiver In Agreement with Plan?: Yes Does Caregiver/Family have Issues with Lodging/Transportation while Pt is in Rehab?: No  Goals Patient/Family Goal for Rehab: Pt/OT supervision to mod I, SLP n/a Expected length of stay: 10-14   days Pt/Family Agrees to Admission and willing to participate: Yes Program Orientation Provided & Reviewed with Pt/Caregiver Including Roles  & Responsibilities: Yes  Decrease burden of Care through IP rehab admission: n/a  Possible need for SNF placement upon discharge: not anticipated  Patient Condition: I have reviewed medical records from [facility], spoken with CM, and patient and daughter. I met with patient at the bedside and discussed via phone for inpatient rehabilitation assessment.  Patient will benefit from ongoing PT and OT, can actively participate in 3 hours of therapy a day 5 days of the week, and can make measurable gains during the admission.  Patient will also benefit from the coordinated team approach during an Inpatient Acute Rehabilitation admission.  The patient will receive intensive therapy as well as Rehabilitation physician, nursing, social worker, and care management interventions.  Due to safety, skin/wound care, disease management, medication administration, pain management, and patient education the patient requires 24 hour a day rehabilitation nursing.  The patient is currently min to mod assist with mobility and basic ADLs.  Discharge  setting and therapy post discharge at home with home health is anticipated.  Patient has agreed to participate in the Acute Inpatient Rehabilitation Program and will admit today.  Preadmission Screen Completed By:  Kaylei Frink E Jeanise Durfey, PT, DPT 01/11/2021 10:39 AM ______________________________________________________________________   Discussed status with Dr. Raulkar on .today at 10:39 AM and received approval for admission today.  Admission Coordinator:  Krystel Fletchall E Ladine Kiper, PT, DPT time 10:39 AM /Date 01/11/21    Assessment/Plan: Diagnosis: Cardiopulmonary debility 2/2 NSTEMI Does the need for close, 24 hr/day Medical supervision in concert with the patient's rehab needs make it unreasonable for this patient to be served in a less intensive setting? Yes Co-Morbidities requiring supervision/potential complications: acute metabolic encephalopathy, transaminitis, leukocytosis, macrocytic anemia, AKI, UTI Due to bladder management, bowel management, safety, skin/wound care, disease management, medication administration, pain management, and patient education, does the patient require 24 hr/day rehab nursing? Yes Does the patient require coordinated care of a physician, rehab nurse, PT, OT, and SLP to address physical and functional deficits in the context of the above medical diagnosis(es)? Yes Addressing deficits in the following areas: balance, endurance, locomotion, strength, transferring, bowel/bladder control, bathing, dressing, feeding, grooming, toileting, and psychosocial support Can the patient actively participate in an intensive therapy program of at least 3 hrs of therapy 5 days a week? Yes The potential for patient to make measurable gains while on inpatient rehab is excellent Anticipated functional outcomes upon discharge from inpatient rehab: modified independent and supervision PT, modified independent OT, independent SLP Estimated rehab length of stay to reach the above functional goals  is: 10-14 days Anticipated discharge destination: Home 10. Overall Rehab/Functional Prognosis: excellent   MD Signature: Krutika Raulkar, MD 

## 2021-01-11 NOTE — Discharge Summary (Addendum)
Advanced Heart Failure Team  Discharge Summary   Patient ID: Jane Navarro MRN: [id number], DOB/AGE: March 28, 1946 75 y.o. Admit date: 12/28/2020 D/C date:     01/11/2021   Primary Discharge Diagnoses:  CAD with NSTEMI Acute systolic HF Cardiogenic shock Shock liver Anemia, acute blood loss Thrombocytopenia AKI Syncope   Hospital Course: Maimouna Rondeau is a 75 year old female with history of HTN, HLD, CVA, hypothyroidism, Stage IIIa CKD.   She was admitted 12/05/20 with AMS in the setting of severe hypothyroidism and small stroke left ventricular gyrus. Neurology consulted. 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 Started on levothyroxine and discharged to rehab.  Discharged to home 3 days but had functional decline.    Presented with NSTEMI on 09/27. Pertinent labs included: HS Trop 7673>4193, AST 2238, ALT 1270, BNP > 4500, hgb 8.7, creatinine 1.6, and WBC 13.6. UA with small leukocytes. Started on rochephin. Echo LVEF 25-30%, WMA, RV severely reduced, LA/RA dilated, and TV mod-severe regurgitation. Also with small hypermobile filament in LV consistent with previously described mass.  Taken for cath. Hypotensive/acidotic in the lab. PA sat 39%. Given bicarb x 2 and started on milrinone. Impella CP placed R groin + swan + arterial line. Found to have severe LM with multivessel disease. 9/30 PCI to RCA with DES x 3, deferred intervention on left main with difficulty wiring the LAD and non-flow limiting dissection.  Will plan PCI several weeks down the road to allow healing.  Course complicated by shock liver, AKI on CKD, anemia d/t hematoma R grain and LUE given multiple unit pRBCs, thrombocytopenia d/t impella. HIT negative. Impella extracted on 10/03. Milrinone weaned off.  Started on GDMT with spiro, digoxin and losartan. Not aggressively  titrated d/t weakness and orthostasis.  Had 2 vasovagal episodes with LOC, 10/07 and 10/09. No arrhythmias to explain events. Arranging for serial orthostatics at CIR.  Patient weak and has unsteady gait. PT evaluated and recommended CIR which was approved on 10/10.   Will continue to follow at CIR.   Discharge Vitals: Blood pressure 115/75, pulse 68, temperature 97.6 F (36.4 C), temperature source Oral, resp. rate 20, height 5' (1.524 m), weight 68.4 kg, SpO2 95 %.  Labs: Lab Results  Component Value Date   WBC 5.9 01/11/2021   HGB 9.3 (L) 01/11/2021   HCT 29.4 (L) 01/11/2021   MCV 94.8 01/11/2021   PLT 395 01/11/2021    Recent Labs  Lab 01/11/21 0438  NA 135  K 4.0  CL 104  CO2 25  BUN 10  CREATININE 0.63  CALCIUM 8.2*  PROT 5.0*  BILITOT 1.1  ALKPHOS 79  ALT 61*  AST 39  GLUCOSE 82   Lab Results  Component Value Date   CHOL 96 01/05/2021   HDL 22 (L) 01/05/2021   LDLCALC 56 01/05/2021   TRIG 92 01/05/2021   BNP (last 3 results) Recent Labs    12/29/20 0014  BNP >4,500.0*    ProBNP (last 3 results) No results for input(s): PROBNP in the last 8760 hours.   Diagnostic Studies/Procedures   No results found.  Discharge Medications   Allergies as of 01/11/2021       Reactions   Ciprofloxacin    Erythromycin    Penicillins    Ampicillin Rash   Mostly topical per daughter  Medication List     STOP taking these medications    amLODipine 5 MG tablet Commonly known as: NORVASC   bisacodyl 10 MG suppository Commonly known as: DULCOLAX   cetirizine HCl 5 MG/5ML Soln Commonly known as: Zyrtec   clopidogrel 75 MG tablet Commonly known as: PLAVIX   cyanocobalamin 100 MCG tablet   thiamine 100 MG tablet   vitamin B-12 500 MCG tablet Commonly known as: CYANOCOBALAMIN       TAKE these medications    aspirin 81 MG EC tablet Take 1 tablet (81 mg total) by mouth daily. Swallow whole.   atorvastatin 80 MG tablet Commonly  known as: LIPITOR Take 1 tablet (80 mg total) by mouth daily.   digoxin 0.125 MG tablet Commonly known as: LANOXIN Take 1 tablet (0.125 mg total) by mouth daily. Start taking on: January 12, 2021   ezetimibe 10 MG tablet Commonly known as: ZETIA Take 1 tablet (10 mg total) by mouth daily. Start taking on: January 12, 2021   levothyroxine 100 MCG tablet Commonly known as: SYNTHROID Take 1 tablet (100 mcg total) by mouth daily at 6 (six) AM.   losartan 25 MG tablet Commonly known as: COZAAR Take 0.5 tablets (12.5 mg total) by mouth daily. Start taking on: January 12, 2021   spironolactone 25 MG tablet Commonly known as: ALDACTONE Take 1 tablet (25 mg total) by mouth daily. Start taking on: January 12, 2021   ticagrelor 90 MG Tabs tablet Commonly known as: BRILINTA Take 1 tablet (90 mg total) by mouth 2 (two) times daily.       ASK your doctor about these medications    polyethylene glycol 17 g packet Commonly known as: MIRALAX / GLYCOLAX Take 17 g by mouth 2 (two) times daily.        Disposition   The patient will be discharged in stable condition to home. Discharge Instructions     AMB Referral to [facility] Pharm-D   Complete by: As directed    Reason For Referral: Lipids   Amb Referral to Cardiac Rehabilitation   Complete by: As directed    Diagnosis:  Coronary Stents NSTEMI     After initial evaluation and assessments completed: Virtual Based Care may be provided alone or in conjunction with Phase 2 Cardiac Rehab based on patient barriers.: Yes   Discharge patient   Complete by: As directed    Discharge to CIR   Discharge disposition: [facility]Another [facility] Care Institution Not Defined   Discharge patient date: 01/11/2021          Duration of Discharge Encounter: Greater than 35 minutes   Signed, [facility], LINDSAY N  01/11/2021, 6:02 PM   Patient seen and examined with the above-signed Advanced Practice Provider and/or Housestaff. I personally  reviewed laboratory data, imaging studies and relevant notes. I independently examined the patient and formulated the important aspects of the plan. I have edited the note to reflect any of my changes or salient points. I have personally discussed the plan with the patient and/or family.  She is ready for d/c to CIR. We will continue to follow.   Arvilla Meres, MD  7:45 PM

## 2021-01-11 NOTE — H&P (Signed)
Physical Medicine and Rehabilitation Admission H&P  CC: Cardiopulmonary debility  HPI: Jane Navarro is a 75 year old female with history of CVA, vertigo, OA bilateral knees, recent admission for encephalopathy due to hypothyroidism who was admitted on 12/28/20 with reports of progressive decline since d/c from [facility] three days PTA. Family reported decrease in intake as well as some chest pain PTA. She was found to be hypotensive with new LBBB, elevated troponins due to NSTEMI, UTI and AKI. She was treated with IV antibiotics and 2D echo showed new reduction in EF to 25-30% with moderate to severe TVR as well as small hypermobile filament in LV.Marland Kitchen  She was found to have cardiogenic shock and underwent urgent catherizaton by Dr. Martinique revealing severe Left main and multivessel disease in  proximal LAD and mid RCX. She was required milirione and impella placed  by Dr.Bemsimhon for management of shock.  She did have  issues with drop in H/H requiring 3 total units of PRBC.    Medical treatment recommended as patient felt to be poor surgical candidate and she underwent PCI with RCA with three overlapping stents  by Dr. Johnsie Cancel on 09/30 with plans for LAD intervention in the future.  Post procedure with hypotension and bleeding with hematoma left groin as well as drop in platelets to 54. 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   Impella removed on 10/04 but has continued to have issue with hypotension requiring NE resumption and milirionone weaned off.  Medications being adjusted due to ongoing issues with issues with orthostasis and intermittent syncope --last on 10/09.   Therapy ongoing and she continues to be limited by weakness with shuffling gait, balance deficits with posterior lean, fear as well as fatigue affecting mobility and ADLs. She was able to sit up in a chair for 2 hours yesterday but limited by syncopal episode. CIR recommended  due to functional decline. She currently has no new complaints.    Review of Systems  Constitutional:  Negative for chills, fever and malaise/fatigue.       Just tired/weak  HENT:  Negative for hearing loss.   Eyes:  Negative for discharge and redness.  Respiratory:  Negative for sputum production, shortness of breath and stridor.   Cardiovascular:  Positive for leg swelling. Negative for chest pain and palpitations.  Gastrointestinal:  Positive for constipation. Negative for abdominal pain.  Genitourinary:  Positive for dysuria and frequency.  Musculoskeletal:  Positive for joint pain and myalgias.  Skin:  Negative for rash.  Neurological:  Positive for weakness. Negative for dizziness.  Psychiatric/Behavioral:  The patient has insomnia.     Past Medical History:  Diagnosis Date   Anemia    CVA (cerebral vascular accident) ([place])    HLD (hyperlipidemia)    Hypothyroidism    Tobacco abuse, in remission     Past Surgical History:  Procedure Laterality Date   CENTRAL LINE INSERTION  12/29/2020   Procedure: CENTRAL LINE INSERTION;  Surgeon: Martinique, Peter M, MD;  Location: [place] [facility];  Service: Cardiovascular;;   CORONARY STENT INTERVENTION N/A 01/01/2021   Procedure: CORONARY STENT INTERVENTION;  Surgeon: Early Osmond, MD;  Location: [place] [facility];  Service: Cardiovascular;  Laterality: N/A;   INTRAVASCULAR ULTRASOUND/IVUS N/A 01/01/2021   Procedure: Intravascular Ultrasound/IVUS;  Surgeon: Early Osmond, MD;  Location: [place] [facility];  Service: Cardiovascular;  Laterality: N/A;   RIGHT/LEFT HEART CATH AND  CORONARY ANGIOGRAPHY N/A 12/29/2020   Procedure: RIGHT/LEFT HEART CATH AND CORONARY ANGIOGRAPHY;  Surgeon: Martinique, Peter M, MD;  Location: [place] [facility];  Service: Cardiovascular;  Laterality: N/A;   TEE WITHOUT CARDIOVERSION N/A 12/11/2020   Procedure: TRANSESOPHAGEAL ECHOCARDIOGRAM (TEE);  Surgeon: Josue Hector, MD;  Location: [facility] ENDOS[facility];  Service:  Cardiovascular;  Laterality: N/A;   TOTAL ABDOMINAL HYSTERECTOMY     VENTRICULAR ASSIST DEVICE INSERTION N/A 12/29/2020   Procedure: VENTRICULAR ASSIST DEVICE INSERTION;  Surgeon: Martinique, Peter M, MD;  Location: [place] [facility];  Service: Cardiovascular;  Laterality: N/A;    Family History  Problem Relation Age of Onset   Breast cancer Mother    Dementia Mother    Transient ischemic attack Mother    Parkinson's disease Father     Social History:  reports that she has quit smoking. She has quit using smokeless tobacco. She reports that she does not currently use alcohol. She reports that she does not use drugs.    Allergies  Allergen Reactions   Ciprofloxacin    Erythromycin    Penicillins    Ampicillin Rash    Mostly topical per daughter    Medications Prior to Admission  Medication Sig Dispense Refill   aspirin EC 81 MG EC tablet Take 1 tablet (81 mg total) by mouth daily. Swallow whole. 30 tablet 11   atorvastatin (LIPITOR) 80 MG tablet Take 1 tablet (80 mg total) by mouth daily. 30 tablet 3   [START ON 01/12/2021] digoxin (LANOXIN) 0.125 MG tablet Take 1 tablet (0.125 mg total) by mouth daily.     [START ON 01/12/2021] ezetimibe (ZETIA) 10 MG tablet Take 1 tablet (10 mg total) by mouth daily.     levothyroxine (SYNTHROID) 100 MCG tablet Take 1 tablet (100 mcg total) by mouth daily at 6 (six) AM. 30 tablet 3   [START ON 01/12/2021] losartan (COZAAR) 25 MG tablet Take 0.5 tablets (12.5 mg total) by mouth daily.     polyethylene glycol (MIRALAX / GLYCOLAX) 17 g packet Take 17 g by mouth 2 (two) times daily. (Patient not taking: No sig reported) 14 each 0   [START ON 01/12/2021] spironolactone (ALDACTONE) 25 MG tablet Take 1 tablet (25 mg total) by mouth daily.     ticagrelor (BRILINTA) 90 MG TABS tablet Take 1 tablet (90 mg total) by mouth 2 (two) times daily. 60 tablet     Drug Regimen Review  Drug regimen was reviewed and remains appropriate with no significant issues  identified  Home: Home Living Family/patient expects to be discharged to:: Private residence Living Arrangements: Children Available Help at Discharge: Family, Available PRN/intermittently Type of Home: House Home Access: Stairs to enter (son can build ramp) Technical brewer of Steps: 3-4 Entrance Stairs-Rails: None Home Layout: One level Bathroom Shower/Tub: Chiropodist: Standard Home Equipment: Environmental consultant - 2 wheels, Toilet riser, Transport chair Additional Comments: Likely plan to d/c to son's home as family does not want pt living alone   Functional History: Prior Function Level of Independence: Independent with assistive device(s) Comments: Typically mod indep ambulating with walker; lives alone, enjoys time with dog Elyse Hsu"); increased assist from family since recent admission   Functional Status:  Mobility: Bed Mobility Overal bed mobility: Needs Assistance Bed Mobility: Rolling, Sidelying to Sit, Sit to Sidelying Rolling: Min assist Sidelying to sit: Min assist Supine to sit: Min assist, +2 for safety/equipment, HOB elevated Sit to supine: Mod assist Sit to sidelying: Mod assist General bed mobility  comments: minA to complete rolling in either direction or to elevate trunk from [facility]. modA to bring BLE back into bed Transfers Overall transfer level: Needs assistance Equipment used: Rolling walker (2 wheeled) Transfers: Sit to/from Stand, Stand Pivot Transfers Sit to Stand: Min assist, +2 physical assistance, +2 safety/equipment Stand pivot transfers: Min assist, +2 safety/equipment, +2 physical assistance General transfer comment: minA to power up to standing, pt more confident with +2, cues for wt through toes and anterior wt shift. pt leaning backwards and needing modA to maintain upright until wt shift forward. minA to complete pivoting steps until pt fatigued, then increased urgency and decreased command  following Ambulation/Gait Ambulation/Gait assistance: Min assist, +2 physical assistance, +2 safety/equipment (chair follow) Gait Distance (Feet): 3 Feet (+ 4 ft) Assistive device: Rolling walker (2 wheeled) Gait Pattern/deviations: Step-to pattern, Decreased stride length, Shuffle, Trunk flexed, Narrow base of support General Gait Details: small forward steps with pt needing modA to maintain posture (posterior lean). LOB backwards with any decrease in posterior support Gait velocity: decreased Gait velocity interpretation: <1.31 ft/sec, indicative of household ambulator   ADL: ADL Overall ADL's : Needs assistance/impaired Eating/Feeding: Set up, Sitting Grooming: Wash/dry face, Min guard, Sitting Grooming Details (indicate cue type and reason): EOB Upper Body Bathing: Sitting, Minimal assistance Lower Body Bathing: Sit to/from stand, Moderate assistance Lower Body Bathing Details (indicate cue type and reason): knees down Upper Body Dressing : Minimal assistance, Sitting Upper Body Dressing Details (indicate cue type and reason): to don extra gown like robe Lower Body Dressing: Maximal assistance, Sitting/lateral leans Lower Body Dressing Details (indicate cue type and reason): to don socks Toilet Transfer: Minimal assistance, +2 for physical assistance, +2 for safety/equipment (EVA walker) Toilet Transfer Details (indicate cue type and reason): short pivot ambulation, fatigues quickly Toileting- Clothing Manipulation and Hygiene: Maximal assistance, Sit to/from stand Functional mobility during ADLs: Minimal assistance, +2 for safety/equipment (EVA walker) General ADL Comments: demonstrating increased activity tolerance and continued motivation   Cognition: Cognition Overall Cognitive Status: Within Functional Limits for tasks assessed Orientation Level: Oriented X4 Cognition Arousal/Alertness: Awake/alert Behavior During Therapy: WFL for tasks assessed/performed Overall  Cognitive Status: Within Functional Limits for tasks assessed General Comments: pt following commands appropriately, seems slightly more fearful of movement/activity at this session  Blood pressure 123/78, pulse 69, temperature 98.7 F (37.1 C), temperature source Oral, resp. rate 17, height 5' (1.524 m), weight 65.8 kg, SpO2 94 %. Physical Exam Vitals and nursing note reviewed.  Constitutional:      Appearance: Normal appearance.  Pulmonary:     Effort: Pulmonary effort is normal.     Breath sounds: Examination of the right-lower field reveals decreased breath sounds. Examination of the left-lower field reveals decreased breath sounds. Decreased breath sounds present.  Abdominal:     General: There is no distension.     Tenderness: There is no abdominal tenderness.     Comments: Moderate size umbilical hernia easily reducible.   Musculoskeletal:     Comments: 2+ pitting edema bilateral ankles and B-forearms. 1+ edema Left>right thigh.   Skin:    Comments: Ecchymosis bilateral groin--left puncture site closed, C/D and right puncture site with small area of dehiscence and dry dressing.    Neurological:     Mental Status: She is alert and oriented to person, place, and time.     Comments: Mildly ataxic speech but clear and follows commands without difficulty.  4/5 strength throughout  Results for orders placed or performed during the hospital encounter of  12/28/20 (from the past 48 hour(s))  Magnesium     Status: None   Collection Time: 01/10/21  3:31 AM  Result Value Ref Range   Magnesium 2.2 1.7 - 2.4 mg/dL    Comment: Performed at [facility] [address], [place], [place] [address]  Comprehensive metabolic panel     Status: Abnormal   Collection Time: 01/10/21  3:31 AM  Result Value Ref Range   Sodium 133 (L) 135 - 145 mmol/L   Potassium 3.9 3.5 - 5.1 mmol/L   Chloride 103 98 - 111 mmol/L   CO2 26 22 - 32 mmol/L   Glucose, Bld 83 70 - 99 mg/dL    Comment: Glucose  reference range applies only to samples taken after fasting for at least 8 hours.   BUN 9 8 - 23 mg/dL   Creatinine, Ser 0.68 0.44 - 1.00 mg/dL   Calcium 8.1 (L) 8.9 - 10.3 mg/dL   Total Protein 4.8 (L) 6.5 - 8.1 g/dL   Albumin 1.9 (L) 3.5 - 5.0 g/dL   AST 32 15 - 41 U/L   ALT 64 (H) 0 - 44 U/L   Alkaline Phosphatase 80 38 - 126 U/L   Total Bilirubin 1.1 0.3 - 1.2 mg/dL   GFR, Estimated >60 >60 mL/min    Comment: (NOTE) Calculated using the CKD-EPI Creatinine Equation (2021)    Anion gap 4 (L) 5 - 15    Comment: Performed at [facility], [place] [address], [place], [place] [address]  CBC     Status: Abnormal   Collection Time: 01/10/21  3:31 AM  Result Value Ref Range   WBC 5.5 4.0 - 10.5 K/uL   RBC 3.03 (L) 3.87 - 5.11 MIL/uL   Hemoglobin 9.3 (L) 12.0 - 15.0 g/dL   HCT 28.5 (L) 36.0 - 46.0 %   MCV 94.1 80.0 - 100.0 fL   MCH 30.7 26.0 - 34.0 pg   MCHC 32.6 30.0 - 36.0 g/dL   RDW 15.3 11.5 - 15.5 %   Platelets 371 150 - 400 K/uL   nRBC 0.0 0.0 - 0.2 %    Comment: Performed at [facility], [place] [address] [place], [place] [address]  Cooxemetry Panel (carboxy, met, total hgb, O2 sat)     Status: Abnormal   Collection Time: 01/10/21  4:23 AM  Result Value Ref Range   Total hemoglobin 6.3 (LL) 12.0 - 16.0 g/dL    Comment: CRITICAL RESULT CALLED TO, READ BACK BY AND VERIFIED WITH: Selinda Flavin, RN 01/10/2021 0448 BTAYLOR    O2 Saturation 60.0 %   Carboxyhemoglobin 2.5 (H) 0.5 - 1.5 %   Methemoglobin 0.8 0.0 - 1.5 %    Comment: Performed at [facility] [address] [place], [place] [address]  .Cooxemetry Panel (carboxy, met, total hgb, O2 sat)     Status: Abnormal   Collection Time: 01/10/21  5:19 AM  Result Value Ref Range   Total hemoglobin 8.3 (L) 12.0 - 16.0 g/dL   O2 Saturation 59.5 %   Carboxyhemoglobin 2.4 (H) 0.5 - 1.5 %   Methemoglobin 0.8 0.0 - 1.5 %    Comment: Performed at [facility] [address], [place] [address]  CBC      Status: Abnormal   Collection Time: 01/10/21  9:15 AM  Result Value Ref Range   WBC 5.7 4.0 - 10.5 K/uL   RBC 3.10 (L) 3.87 - 5.11 MIL/uL  Hemoglobin 9.3 (L) 12.0 - 15.0 g/dL   HCT 29.2 (L) 36.0 - 46.0 %   MCV 94.2 80.0 - 100.0 fL   MCH 30.0 26.0 - 34.0 pg   MCHC 31.8 30.0 - 36.0 g/dL   RDW 15.2 11.5 - 15.5 %   Platelets 359 150 - 400 K/uL   nRBC 0.0 0.0 - 0.2 %    Comment: Performed at [facility] [address] [place], [place] [address]  Cooxemetry Panel (carboxy, met, total hgb, O2 sat)     Status: Abnormal   Collection Time: 01/11/21  4:38 AM  Result Value Ref Range   Total hemoglobin 10.0 (L) 12.0 - 16.0 g/dL   O2 Saturation 60.2 %   Carboxyhemoglobin 2.0 (H) 0.5 - 1.5 %   Methemoglobin 0.8 0.0 - 1.5 %    Comment: Performed at [facility] [address] [place], [place] [address]  Magnesium     Status: None   Collection Time: 01/11/21  4:38 AM  Result Value Ref Range   Magnesium 2.1 1.7 - 2.4 mg/dL    Comment: Performed at [facility] [address] [place], [place] [address]  Comprehensive metabolic panel     Status: Abnormal   Collection Time: 01/11/21  4:38 AM  Result Value Ref Range   Sodium 135 135 - 145 mmol/L   Potassium 4.0 3.5 - 5.1 mmol/L   Chloride 104 98 - 111 mmol/L   CO2 25 22 - 32 mmol/L   Glucose, Bld 82 70 - 99 mg/dL    Comment: Glucose reference range applies only to samples taken after fasting for at least 8 hours.   BUN 10 8 - 23 mg/dL   Creatinine, Ser 0.63 0.44 - 1.00 mg/dL   Calcium 8.2 (L) 8.9 - 10.3 mg/dL   Total Protein 5.0 (L) 6.5 - 8.1 g/dL   Albumin 2.0 (L) 3.5 - 5.0 g/dL   AST 39 15 - 41 U/L   ALT 61 (H) 0 - 44 U/L   Alkaline Phosphatase 79 38 - 126 U/L   Total Bilirubin 1.1 0.3 - 1.2 mg/dL   GFR, Estimated >60 >60 mL/min    Comment: (NOTE) Calculated using the CKD-EPI Creatinine Equation (2021)    Anion gap 6 5 - 15    Comment: Performed at [facility], [place] [address] [place], [place] [address]   CBC     Status: Abnormal   Collection Time: 01/11/21  4:38 AM  Result Value Ref Range   WBC 5.9 4.0 - 10.5 K/uL   RBC 3.10 (L) 3.87 - 5.11 MIL/uL   Hemoglobin 9.3 (L) 12.0 - 15.0 g/dL   HCT 29.4 (L) 36.0 - 46.0 %   MCV 94.8 80.0 - 100.0 fL   MCH 30.0 26.0 - 34.0 pg   MCHC 31.6 30.0 - 36.0 g/dL   RDW 15.3 11.5 - 15.5 %   Platelets 395 150 - 400 K/uL   nRBC 0.0 0.0 - 0.2 %    Comment: Performed at [facility], [place] [address], [place], [place] [address]   No results found.     Medical Problem List and Plan: 1.  Cardiopulmonary debility s/p NSTEMI  -patient may shower but incision must be covered  -ELOS/Goals: 10-14 days modI/S 2.  Impaired mobility: -DVT/anticoagulation:  Pharmaceutical: Lovenox  -antiplatelet therapy: Continue ASA/Brillinta 3. Postoperative pain: Continue Oxycodone prn  4. Mood: LCSW to follow for evaluation and support.   -  antipsychotic agents: N/a 5. Neuropsych: This patient is capable of making decisions on her own behalf. 6. Skin/Wound Care: Routine pressure relief measures.  7. Fluids/Electrolytes/Nutrition: Monitor I/O. Check lytes in am  8. NSTEMI/CAD s/p PCI/stent: Monitor for symptoms with increase in activity.  9. Acute systolic HF: Heart healthy diet. Check daily weights--admit wt 141-->150 lbs today. --Monitor for symptoms of overload.  --On spironolactone, Cozaar, Digoxin, ASA/Brillinta and Lipitor  10. Hypothyroid: On supplement for past month.  TSH ordered per cards  --Improving from 41.4-->4.8 in three weeks.  11. AKI: Has resolved--recheck Lytes in am.  12. Shocked liver: Abnormal LFTs are resolving--recheck LFTs in am.  13. Acute blood loss anemia: Groin hematomas appear resolved.  --H/H stable around 9.3. Recheck CBC in am.  14. Low albumin levels: Continue Ensure BID--family encouraged to bring low salt foods from home. 15. Bilateral Knee pain: Voltaren gel added to help manage symptoms.  16. Constipation: Will increase Senna to  bid (does not like daily miralax-->d/c and continue MOM daily. Suppository today as reports 0 and  17. Orthstasis w/syncope: Will order orthostatic vitals.  --change to THT and order abdominal binder.   --encourage oral intake.  18. Overweight BMI 28.33: provide dietary education  I have personally performed a face to face diagnostic evaluation, including, but not limited to relevant history and physical exam findings, of this patient and developed relevant assessment and plan.  Additionally, I have reviewed and concur with the physician assistant's documentation above.  Izora Ribas, MD 01/11/2021   Bary Leriche, PA-C

## 2021-01-11 NOTE — Progress Notes (Addendum)
Patient ID: Jane Navarro, female   DOB: 11/27/1945, 75 y.o.   MRN: [id number]     Advanced Heart Failure Rounding Note  PCP-Cardiologist: Parke Poisson, MD   Subjective:    - 9/27: NSTEMI-->Cardiogenic shock. Impella CP placed and started on milrinone. Given 2 UPRBCs. Milrinone increased to 0.375 mcg.  - 9/28: Continued on milrinone 0.375 mcg + impella P8. Started on losartan and spiro.  - 9/30:  PCI to RCA overlapping DES x 3, deferred intervention on left system.  Groin site bleeding, femstop applied and 2 units PRBCs given.  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  - 10/3: Impella extracted  10/6 Milrinone stopped. Started on losartan, and spiro. Lasix was increased. Around 1730 had possible vagal episode when standing with brief loss of consciousness.   Had another syncopal episode yesterday afternoon. Felt to be likely vasovagal event. No arrhythmias on tele during episode.  No further events this am.  Denies chest pain or dyspnea.  Remains off milrinone. Co-ox stable at 60%  Hgb 9.3   Ambulated a few steps with PT. Requires walker and staff assistance with mobility. Very weak.  Objective:   Weight Range: 68.4 kg Body mass index is 29.45 kg/m.   Vital Signs:   Temp:  [97.8 F (36.6 C)-98.8 F (37.1 C)] 98.1 F (36.7 C) (10/10 0813) Pulse Rate:  [70-77] 72 (10/10 0813) Resp:  [15-24] 20 (10/10 0813) BP: (122-150)/(74-101) 127/74 (10/10 0813) SpO2:  [94 %-96 %] 95 % (10/10 0813) Last BM Date: 01/09/21  Weight change: Filed Weights   01/08/21 0000 01/08/21 0620 01/09/21 0256  Weight: 67.5 kg 65.8 kg 68.4 kg    Intake/Output:   Intake/Output Summary (Last 24 hours) at 01/11/2021 0857 Last data filed at 01/11/2021 0830 Gross per 24 hour  Intake 720 ml  Output 600 ml  Net 120 ml     Physical Exam   General:  Elderly female in no distress. Appears weak.  HEENT: normal Neck: supple. no JVD. Carotids 2+ bilat; no bruits. No  lymphadenopathy or thryomegaly appreciated. Cor: PMI nondisplaced. Regular rate & rhythm. No rubs, gallops or murmurs. Lungs: clear Abdomen: soft, nontender, nondistended. No hepatosplenomegaly. No bruits or masses. Good bowel sounds. Extremities: no cyanosis, clubbing, rash, edema Neuro: alert & orientedx3, cranial nerves grossly intact. moves all 4 extremities w/o difficulty. Affect pleasant    Telemetry   NSR 70s  Labs    CBC Recent Labs    01/10/21 0915 01/11/21 0438  WBC 5.7 5.9  HGB 9.3* 9.3*  HCT 29.2* 29.4*  MCV 94.2 94.8  PLT 359 395   Basic Metabolic Panel Recent Labs    88/41/66 0331 01/11/21 0438  NA 133* 135  K 3.9 4.0  CL 103 104  CO2 26 25  GLUCOSE 83 82  BUN 9 10  CREATININE 0.68 0.63  CALCIUM 8.1* 8.2*  MG 2.2 2.1   Liver Function Tests Recent Labs    01/10/21 0331 01/11/21 0438  AST 32 39  ALT 64* 61*  ALKPHOS 80 79  BILITOT 1.1 1.1  PROT 4.8* 5.0*  ALBUMIN 1.9* 2.0*   No results for input(s): LIPASE, AMYLASE in the last 72 hours. Cardiac Enzymes No results for input(s): CKTOTAL, CKMB, CKMBINDEX, TROPONINI in the last 72 hours.  BNP: BNP (last 3 results) Recent Labs    12/29/20 0014  BNP >4,500.0*    ProBNP (last 3 results) No results for input(s): PROBNP in the last 8760 hours.   D-Dimer No  results for input(s): DDIMER in the last 72 hours. Hemoglobin A1C No results for input(s): HGBA1C in the last 72 hours. Fasting Lipid Panel No results for input(s): CHOL, HDL, LDLCALC, TRIG, CHOLHDL, LDLDIRECT in the last 72 hours. Thyroid Function Tests No results for input(s): TSH, T4TOTAL, T3FREE, THYROIDAB in the last 72 hours.  Invalid input(s): FREET3   Other results:   Imaging    No results found.   Medications:     Scheduled Medications:  aspirin EC  81 mg Oral Daily   atorvastatin  80 mg Oral Daily   chlorhexidine  15 mL Mouth Rinse BID   Chlorhexidine Gluconate Cloth  6 each Topical Daily   digoxin  0.125  mg Oral Daily   docusate sodium  200 mg Oral Daily   enoxaparin (LOVENOX) injection  40 mg Subcutaneous Q24H   ezetimibe  10 mg Oral Daily   feeding supplement  237 mL Oral BID BM   levothyroxine  100 mcg Oral Q0600   losartan  12.5 mg Oral Daily   magnesium hydroxide  30 mL Oral Daily   mouth rinse  15 mL Mouth Rinse q12n4p   polyethylene glycol  17 g Oral Daily   potassium chloride  20 mEq Oral Daily   senna  2 tablet Oral Daily   sodium chloride flush  10-40 mL Intracatheter Q12H   spironolactone  25 mg Oral Daily   thiamine  100 mg Oral Daily   ticagrelor  90 mg Oral BID    Infusions:    PRN Medications: acetaminophen, fentaNYL (SUBLIMAZE) injection, lip balm, ondansetron (ZOFRAN) IV, oxyCODONE-acetaminophen, sodium chloride, sodium chloride flush    Patient Profile   Jane Navarro is a 75 year old with history of htn, hyperlipidemia, CVA, hypothyroidism, and CKD Stage IIIa.   Admitted with chest pain--> NSTEMI -->cardiogenic shock    Assessment/Plan   1. 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 ECHO EF 25-30% and severely reduced RV function.  - LHC with severe L main with multivessel  proximal LAD and mild RCA .  - In the cath lab PA sat < 40%., CI 1.6.  Placed Impella 2.5 placed and started on milrinone  - Felt too high risk for CABG.  - 9/30 PCI to RCA with DES x 3, deferred intervention on left main with difficulty wiring the LAD and non-flow limiting dissection.  Can consider repeat PCI attempt (versus off-pump LIMA) down the road as she improves - Continue ASA 81, ticagrelor, atorvastatin. - No s/s angina  2.  Acute systolic HF ->  Cardiogenic shock:  - Ischemic cardiomyopathy.  Echo with EF 25-30%, severely decreased RV function, moderate-severe TR, moderate MR. Required MCS w/ Impella. Impella removed 10/3.  - Co-ox stable off milrinone.  - Diuretics on hold due to orthostasis - Continue digoxin 0.125 mcg  - Continue spiro 25 mg  -  Continue losartan 12.5 - Avoiding SGLT2i due to groin infections.  - No beta blocker d/t recent shock - Will hold off on further med titration for now d/t recurrent syncopal episodes. Somewhat frail.  - Continue ted hose.  3. Shock Liver  - LFTs improving.     4. Anemia  - Hgb dropped to 6.5 initially>> given 2UPRBCS with appropriate rise to 8.9.  - 1U RBCs given 9/29,  - Significant hematoma left groin post-intervention, had 2 units PRBCs 9/30 with appropriate increase in hgb. IABP removed 10/3. Site remains stable  - Hgb 7.6>>9.1 > 9.4 > 9.3 > 9.3  5. Thrombocytopenia due to Impella - resolved  6. AKI:  - resolved - Renal function normalized.   7. Debility - PT/OT following. Recommend CIR. Quite weak and requires assistance with mobility. - Denied CIR despite medical necessity. I called for peer-to-peer with Dr. Leontine Locket on Friday morning. Still awaiting call back   [facility], Dalbert Garnet MD 8:57 AM  Patient seen and examined with the above-signed Advanced Practice Provider and/or Housestaff. I personally reviewed laboratory data, imaging studies and relevant notes. I independently examined the patient and formulated the important aspects of the plan. I have edited the note to reflect any of my changes or salient points. I have personally discussed the plan with the patient and/or family.  Feels ok this am. Remains weak. Had another vagal episode yesterday but rhythm and BP apparently stable. Denies CP, orthopnea or PND.   General:  Weak appearing. No resp difficulty HEENT: normal Neck: supple. no JVD. Carotids 2+ bilat; no bruits. No lymphadenopathy or thryomegaly appreciated. Cor: PMI nondisplaced. Regular rate & rhythm. No rubs, gallops or murmurs. Lungs: clear Abdomen: soft, nontender, nondistended. No hepatosplenomegaly. No bruits or masses. Good bowel sounds. Extremities: no cyanosis, clubbing, rash, edema Neuro: alert & orientedx3, cranial nerves grossly intact.  moves all 4 extremities w/o difficulty. Affect pleasant  Trigger for possible vagal events unclear. Rhythm stable. Do not appear ischemic. Continue TED hose. May need midodrine.   She has been approved for CIR. Stable for d/c there today. We will follow closely.   Arvilla Meres, MD  9:34 AM

## 2021-01-11 NOTE — Progress Notes (Signed)
Left and right groin cleansed and new dressing applied. With minimal drainage noted.

## 2021-01-12 DIAGNOSIS — R5381 Other malaise: Secondary | ICD-10-CM | POA: Diagnosis not present

## 2021-01-12 LAB — CBC WITH DIFFERENTIAL/PLATELET
Abs Immature Granulocytes: 0.03 10*3/uL (ref 0.00–0.07)
Basophils Absolute: 0.1 10*3/uL (ref 0.0–0.1)
Basophils Relative: 1 %
Eosinophils Absolute: 0.1 10*3/uL (ref 0.0–0.5)
Eosinophils Relative: 1 %
HCT: 29 % — ABNORMAL LOW (ref 36.0–46.0)
Hemoglobin: 9 g/dL — ABNORMAL LOW (ref 12.0–15.0)
Immature Granulocytes: 1 %
Lymphocytes Relative: 18 %
Lymphs Abs: 1 10*3/uL (ref 0.7–4.0)
MCH: 30.1 pg (ref 26.0–34.0)
MCHC: 31 g/dL (ref 30.0–36.0)
MCV: 97 fL (ref 80.0–100.0)
Monocytes Absolute: 0.5 10*3/uL (ref 0.1–1.0)
Monocytes Relative: 9 %
Neutro Abs: 3.9 10*3/uL (ref 1.7–7.7)
Neutrophils Relative %: 70 %
Platelets: 397 10*3/uL (ref 150–400)
RBC: 2.99 MIL/uL — ABNORMAL LOW (ref 3.87–5.11)
RDW: 15.2 % (ref 11.5–15.5)
WBC: 5.6 10*3/uL (ref 4.0–10.5)
nRBC: 0 % (ref 0.0–0.2)

## 2021-01-12 LAB — COMPREHENSIVE METABOLIC PANEL
ALT: 51 U/L — ABNORMAL HIGH (ref 0–44)
AST: 32 U/L (ref 15–41)
Albumin: 1.9 g/dL — ABNORMAL LOW (ref 3.5–5.0)
Alkaline Phosphatase: 71 U/L (ref 38–126)
Anion gap: 7 (ref 5–15)
BUN: 11 mg/dL (ref 8–23)
CO2: 25 mmol/L (ref 22–32)
Calcium: 8.3 mg/dL — ABNORMAL LOW (ref 8.9–10.3)
Chloride: 104 mmol/L (ref 98–111)
Creatinine, Ser: 0.66 mg/dL (ref 0.44–1.00)
GFR, Estimated: >60 mL/min (ref >60)
Glucose, Bld: 77 mg/dL (ref 70–99)
Potassium: 3.6 mmol/L (ref 3.5–5.1)
Sodium: 136 mmol/L (ref 135–145)
Total Bilirubin: 1.2 mg/dL (ref 0.3–1.2)
Total Protein: 4.7 g/dL — ABNORMAL LOW (ref 6.5–8.1)

## 2021-01-12 LAB — TSH: TSH: 6.838 u[IU]/mL — ABNORMAL HIGH (ref 0.350–4.500)

## 2021-01-12 LAB — MAGNESIUM: Magnesium: 2.1 mg/dL (ref 1.7–2.4)

## 2021-01-12 LAB — T4, FREE: Free T4: 1.14 ng/dL — ABNORMAL HIGH (ref 0.61–1.12)

## 2021-01-12 NOTE — Progress Notes (Signed)
Inpatient Rehabilitation  Patient information reviewed and entered into eRehab system by Laretha Luepke M. Quindarius Cabello, M.A., CCC/SLP, PPS Coordinator.  Information including medical coding, functional ability and quality indicators will be reviewed and updated through discharge.    

## 2021-01-12 NOTE — Patient Care Conference (Cosign Needed)
Inpatient RehabilitationTeam Conference and Plan of Care Update Date: 01/12/2021   Time: 3:11 PM    Patient Name: Jane Navarro      Medical Record Number: [id number]  Date of Birth: 08/07/1945 Sex: Female         Room/Bed: 4W22C/4W22C-01 Payor Info: Payor: [employer] MEDICARE / Plan: [employer] MEDICARE HMO / Product Type: *No Product type* /    Admit Date/Time:  01/11/2021  4:09 PM  Primary Diagnosis:  Debility  Hospital Problems: Principal Problem:   Debility    Expected Discharge Date: Expected Discharge Date: 01/23/21  Team Members Present: Physician leading conference: Dr. Faith Rogue Social Worker Present: Dossie Der, LCSW Nurse Present: Chana Bode, RN PT Present: Ernestene Kiel, PT OT Present: Primitivo Gauze, OT PPS Coordinator present : Fae Pippin, SLP     Current Status/Progress Goal Weekly Team Focus  Bowel/Bladder   Continent B+B  Remain continent of B+B  monitor   Swallow/Nutrition/ Hydration             ADL's   mod transfers, max LB toileting and LB dressing, mod bathing, very low endurance  supervision  ADL training, functional mobility skills, endurance, pt education   Mobility   min A transfers, CGA bed mobility  S*  Cardiovascular endurance, gait training, transfers   Communication             Safety/Cognition/ Behavioral Observations            Pain   N/A         Skin   MASD to groin; wound right groin w slough  groin wound healing  WOC consult for treatment/mgmt of wound     Discharge Planning:  Going to son's home but he does work and so does daughter. Currently do not have a 24/7 care plan. Daughter to get FMLA forms completed while here   Team Discussion: Volume managed and kidney function improved per MD.  Orthostasis also improved. Continent with MASD to groin/periarea and wound right groin. Sleeping better here than on acute.  Patient on target to meet rehab goals: yes, currently min assist for toileting and tranfers. Mod assist  needed for self care. Needs encouragement for confidence with activity and overcoming fear of falling. Goals for discharge set for intermittent supervision level.  *See Care Plan and progress notes for long and short-term goals.   Revisions to Treatment Plan:  Change to 15/7 therapy schedule  Teaching Needs: Safety, transfers, medications, dietary modifications, skin care, etc.  Current Barriers to Discharge: Decreased caregiver support, Home enviroment access/layout, and Wound care (Son works during the day)  Possible Resolutions to Barriers: Family education HH follow up services     Medical Summary Current Status: debility related to CHF, medical complications. monitoring weights and cv parameters, wound care considerations  Barriers to Discharge: Medical stability   Possible Resolutions to Barriers/Weekly Focus: wound mgt, daily assessment of vs and pt data. nutritional assessment and mgt   Continued Need for Acute Rehabilitation Level of Care: The patient requires daily medical management by a physician with specialized training in physical medicine and rehabilitation for the following reasons: Direction of a multidisciplinary physical rehabilitation program to maximize functional independence : Yes Medical management of patient stability for increased activity during participation in an intensive rehabilitation regime.: Yes Analysis of laboratory values and/or radiology reports with any subsequent need for medication adjustment and/or medical intervention. : Yes   I attest that I was present, lead the team conference, and concur with the  assessment and plan of the team.   Pamelia Hoit 01/12/2021, 3:11 PM

## 2021-01-12 NOTE — Evaluation (Signed)
Occupational Therapy Assessment and Plan  Patient Details  Name: Jane Navarro MRN: [id number] Date of Birth: November 18, 1945  OT Diagnosis: muscle weakness (generalized) Rehab Potential: Rehab Potential (ACUTE ONLY): Good ELOS: 10-12 days   Today's Date: 01/12/2021 OT Individual Time: 5465-6812 OT Individual Time Calculation (min): 60 min     Hospital Problem: Principal Problem:   Debility   Past Medical History:  Past Medical History:  Diagnosis Date   Anemia    CVA (cerebral vascular accident) ([place])    HLD (hyperlipidemia)    Hypothyroidism    Tobacco abuse, in remission    Past Surgical History:  Past Surgical History:  Procedure Laterality Date   CENTRAL LINE INSERTION  12/29/2020   Procedure: CENTRAL LINE INSERTION;  Surgeon: Martinique, Peter M, MD;  Location: [place] [facility];  Service: Cardiovascular;;   CORONARY STENT INTERVENTION N/A 01/01/2021   Procedure: CORONARY STENT INTERVENTION;  Surgeon: Early Osmond, MD;  Location: [place] [facility];  Service: Cardiovascular;  Laterality: N/A;   INTRAVASCULAR ULTRASOUND/IVUS N/A 01/01/2021   Procedure: Intravascular Ultrasound/IVUS;  Surgeon: Early Osmond, MD;  Location: [place] [facility];  Service: Cardiovascular;  Laterality: N/A;   RIGHT/LEFT HEART CATH AND CORONARY ANGIOGRAPHY N/A 12/29/2020   Procedure: RIGHT/LEFT HEART CATH AND CORONARY ANGIOGRAPHY;  Surgeon: Martinique, Peter M, MD;  Location: [place] [facility];  Service: Cardiovascular;  Laterality: N/A;   TEE WITHOUT CARDIOVERSION N/A 12/11/2020   Procedure: TRANSESOPHAGEAL ECHOCARDIOGRAM (TEE);  Surgeon: Josue Hector, MD;  Location: [facility] END[facility];  Service: Cardiovascular;  Laterality: N/A;   TOTAL ABDOMINAL HYSTERECTOMY     VENTRICULAR ASSIST DEVICE INSERTION N/A 12/29/2020   Procedure: VENTRICULAR ASSIST DEVICE INSERTION;  Surgeon: Martinique, Peter M, MD;  Location: [place] [facility];  Service: Cardiovascular;  Laterality: N/A;    Assessment & Plan Clinical  Impression: . Jane Navarro is a 75 year old female with history of CVA, vertigo, OA bilateral knees, recent admission for encephalopathy due to hypothyroidism who was admitted on 12/28/20 with reports of progressive decline since d/c from [facility] three days PTA. Family reported decrease in intake as well as some chest pain PTA. She was found to be hypotensive with new LBBB, elevated troponins due to NSTEMI, UTI and AKI. She was treated with IV antibiotics and 2D echo showed new reduction in EF to 25-30% with moderate to severe TVR as well as small hypermobile filament in LV.Marland Kitchen  She was found to have cardiogenic shock and underwent urgent catherizaton by Dr. Martinique revealing severe Left main and multivessel disease in  proximal LAD and mid RCX. She was required milirione and impella placed  by Dr.Bemsimhon for management of shock.  She did have  issues with drop in H/H requiring 3 total units of PRBC.     Medical treatment recommended as patient felt to be poor surgical candidate and she underwent PCI with RCA with three overlapping stents  by Dr. Johnsie Cancel on 09/30 with plans for LAD intervention in the future.  Post procedure with hypotension and bleeding with hematoma left groin as well as drop in platelets to 54. 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   Impella removed on 10/04 but has continued to have issue with hypotension requiring NE resumption and milirionone weaned off.  Medications being adjusted due to ongoing issues with issues with orthostasis and intermittent syncope --last on 10/09.   Therapy ongoing and she continues to be limited by  weakness with shuffling gait, balance deficits with posterior lean, fear as well as fatigue affecting mobility and ADLs. She was able to sit up in a chair for 2 hours yesterday but limited by syncopal episode. CIR recommended due to functional decline. She currently has no new complaints.  Patient transferred  to CIR on 01/11/2021 .    Patient currently requires mod with basic self-care skills secondary to muscle weakness, decreased cardiorespiratoy endurance, and decreased standing balance and decreased balance strategies.  Prior to hospitalization, patient was fully independent and living alone.  Patient will benefit from skilled intervention to increase independence with basic self-care skills prior to discharge home with care partner.  Anticipate patient will require intermittent supervision and follow up home health.  OT - End of Session Activity Tolerance: Tolerates < 10 min activity, no significant change in vital signs Endurance Deficit: Yes Endurance Deficit Description: pt becomes very fatigued after a few minutes, standing tolerance only 30 seconds OT Assessment Rehab Potential (ACUTE ONLY): Good OT Patient demonstrates impairments in the following area(s): Balance;Endurance;Motor OT Basic ADL's Functional Problem(s): Bathing;Dressing;Toileting;Grooming OT Transfers Functional Problem(s): Toilet;Tub/Shower OT Additional Impairment(s): None OT Plan OT Intensity: Minimum of 1-2 x/day, 45 to 90 minutes OT Frequency: Total of 15 hours over 7 days of combined therapies (due to low endurance, pt can not tolerate a full therapy schedule) OT Duration/Estimated Length of Stay: 10-12 days OT Treatment/Interventions: Balance/vestibular training;Discharge planning;DME/adaptive equipment instruction;Functional mobility training;Patient/family education;Psychosocial support;Self Care/advanced ADL retraining;Therapeutic Exercise;UE/LE Strength taining/ROM;Therapeutic Activities OT Self Feeding Anticipated Outcome(s): independent OT Basic Self-Care Anticipated Outcome(s): supervision OT Toileting Anticipated Outcome(s): supervision OT Bathroom Transfers Anticipated Outcome(s): supervision OT Recommendation Patient destination: Home Follow Up Recommendations: Home health OT Equipment Recommended: 3  in 1 bedside comode   OT Evaluation Precautions/Restrictions  Precautions Precautions: Fall Required Braces or Orthoses: Other Brace Other Brace: pt wears neoprene type sleeve on B knees due to arthritic pain Restrictions Weight Bearing Restrictions: No    Vital Signs Oxygen Therapy SpO2: 100 % O2 Device: Room Air Pain Pain Assessment Pain Score: 0-No pain Home Living/Prior Functioning Home Living Family/patient expects to be discharged to:: Other (Comment) (discharge to live with family member) Living Arrangements: Alone Available Help at Discharge: Family, Available PRN/intermittently Type of Home: House Home Access: Stairs to enter (Son can build ramp) Technical brewer of Steps: 4-5 Entrance Stairs-Rails: Right Home Layout: One level Bathroom Shower/Tub: Tub/shower unit, Walk-in shower (Pt's primary bathroom has tub shower but she has access to son's walk-in shower) Bathroom Toilet: Standard Bathroom Accessibility: Yes  Lives With: Son Prior Function Level of Independence: Requires assistive device for independence, Independent with basic ADLs, Independent with homemaking with ambulation  Able to Take Stairs?: Yes Driving: Yes Vocation: Retired Surveyor, mining Baseline Vision/History: 1 Wears glasses Ability to See in Adequate Light: 0 Adequate Patient Visual Report: No change from baseline Vision Assessment?: No apparent visual deficits Perception  Perception: Within Functional Limits Praxis Praxis: Intact Cognition Overall Cognitive Status: Within Functional Limits for tasks assessed Arousal/Alertness: Awake/alert Orientation Level: Person;Place;Situation Person: Oriented Place: Oriented Situation: Oriented Year: 2022 Month: October Day of Week: Correct Memory: Appears intact Immediate Memory Recall: Sock;Blue;Bed Memory Recall Sock: Without Cue Memory Recall Blue: Without Cue Memory Recall Bed: Without Cue Awareness: Appears intact Safety/Judgment:  Appears intact Sensation Sensation Light Touch: Appears Intact Hot/Cold: Not tested Proprioception: Appears Intact Stereognosis: Not tested Coordination Gross Motor Movements are Fluid and Coordinated: No Fine Motor Movements are Fluid and Coordinated: Yes (slow, needs extra time to fasten buttons)  Coordination and Movement Description: general weakness from deconditioning Finger Nose Finger Test: Slowed but [facility] Heel Shin Test: [facility] Motor  Motor Motor: Within Functional Limits Motor - Skilled Clinical Observations: Impacted by fatigue  Trunk/Postural Assessment  Cervical Assessment Cervical Assessment: Exceptions to [facility] (Forward head) Thoracic Assessment Thoracic Assessment: Exceptions to [facility] (Kyphotic) Lumbar Assessment Lumbar Assessment: Exceptions to [facility] (Posterior pelvic tilt) Postural Control Postural Control: Within Functional Limits  Balance Balance Balance Assessed: Yes Static Sitting Balance Static Sitting - Balance Support: Feet supported;Bilateral upper extremity supported Static Sitting - Level of Assistance: 6: Modified independent (Device/Increase time) Dynamic Sitting Balance Dynamic Sitting - Balance Support: During functional activity;Feet supported Dynamic Sitting - Level of Assistance: 5: Stand by assistance Dynamic Sitting - Balance Activities: Reaching for objects Static Standing Balance Static Standing - Balance Support: During functional activity;Bilateral upper extremity supported Static Standing - Level of Assistance: 4: Min assist Dynamic Standing Balance Dynamic Standing - Balance Support: Bilateral upper extremity supported;During functional activity Dynamic Standing - Level of Assistance: 4: Min assist Extremity/Trunk Assessment RUE Assessment RUE Assessment: Within Functional Limits Passive Range of Motion (PROM) Comments: sh flex to 140 General Strength Comments: 4/5 LUE Assessment LUE Assessment: Within Functional Limits Passive Range of  Motion (PROM) Comments: sh flex to 140 General Strength Comments: 4/5  Care Tool Care Tool Self Care Eating   Eating Assist Level: Set up assist    Oral Care    Oral Care Assist Level: Set up assist    Bathing   Body parts bathed by patient: Right arm;Left arm;Chest;Abdomen;Right upper leg;Left upper leg;Face     Assist Level: Supervision/Verbal cueing    Upper Body Dressing(including orthotics)   What is the patient wearing?: Bra;Button up shirt   Assist Level: Minimal Assistance - Patient > 75%    Lower Body Dressing (excluding footwear)   What is the patient wearing?: Pants Assist for lower body dressing: Maximal Assistance - Patient 25 - 49%    Putting on/Taking off footwear   What is the patient wearing?: Socks;Ted hose Assist for footwear: Dependent - Patient 0%       Care Tool Toileting Toileting activity   Assist for toileting: Maximal Assistance - Patient 25 - 49%     Care Tool Bed Mobility Roll left and right activity   Roll left and right assist level: Contact Guard/Touching assist    Sit to lying activity   Sit to lying assist level: Contact Guard/Touching assist    Lying to sitting on side of bed activity   Lying to sitting on side of bed assist level: the ability to move from lying on the back to sitting on the side of the bed with no back support.: Contact Guard/Touching assist     Care Tool Transfers Sit to stand transfer   Sit to stand assist level: Minimal Assistance - Patient > 75%    Chair/bed transfer   Chair/bed transfer assist level: Minimal Assistance - Patient > 75%     Toilet transfer   Assist Level: Moderate Assistance - Patient 50 - 74%     Care Tool Cognition  Expression of Ideas and Wants Expression of Ideas and Wants: 3. Some difficulty - exhibits some difficulty with expressing needs and ideas (e.g, some words or finishing thoughts) or speech is not clear  Understanding Verbal and Non-Verbal Content Understanding Verbal and  Non-Verbal Content: 4. Understands (complex and basic) - clear comprehension without cues or repetitions   Memory/Recall Ability Memory/Recall Ability : That he  or she is in a hospital/hospital unit;Current season;Location of own room   Refer to Care Plan for Long Term Goals  SHORT TERM GOAL WEEK 1 OT Short Term Goal 1 (Week 1): Pt will be able to complete toilet transfers with min A. OT Short Term Goal 2 (Week 1): Pt will be able to complete 3/3 toileting tasks with min A. OT Short Term Goal 3 (Week 1): Pt will don pants with min A by being able to pull pants over hips herself. OT Short Term Goal 4 (Week 1): Pt will demonstrate improved endurance by tolerating standing at sink to brush her teeth.  Recommendations for other services: None    Skilled Therapeutic Intervention ADL ADL Eating: Set up Grooming: Supervision/safety Upper Body Bathing: Supervision/safety Where Assessed-Upper Body Bathing: Edge of bed Lower Body Bathing: Moderate assistance Where Assessed-Lower Body Bathing: Edge of bed Upper Body Dressing: Minimal assistance Where Assessed-Upper Body Dressing: Edge of bed Lower Body Dressing: Maximal assistance Where Assessed-Lower Body Dressing: Edge of bed Toileting: Maximal assistance Where Assessed-Toileting: Glass blower/designer: Moderate assistance Toilet Transfer Method: Stand pivot Toilet Transfer Equipment: Grab bars Mobility  Bed Mobility Bed Mobility: Rolling Right;Rolling Left;Supine to Sit;Sit to Supine;Scooting to [facility] Rolling Right: Contact Guard/Touching assist Rolling Left: Contact Guard/Touching assist Supine to Sit: Contact Guard/Touching assist Sit to Supine: Contact Guard/Touching assist Scooting to HOB: Contact Guard/Touching assist Transfers Sit to Stand: Minimal Assistance - Patient > 75% Stand to Sit: Minimal Assistance - Patient > 75%  Pt seen for initial evaluation with daughter present.  Discussed role of OT, pt's goals, POC and ELOS.   Pt received in bed and agreeable to engaging in self care tasks.  Pt has a bed at home that is adjustable, with HOB elevated she was able to sit to EOB with S.  From EOB she engaged in B/D. See ADL documentation and then completed standing at bed and stand pivot to wc. Her MAIN deficit is poor activity tolerance and she can only do a few minutes at a time, before needing a rest break.  Worked on relaxed breathing.  Pt resting in w/c with belt alarm on and all needs met.    Discharge Criteria: Patient will be discharged from OT if patient refuses treatment 3 consecutive times without medical reason, if treatment goals not met, if there is a change in medical status, if patient makes no progress towards goals or if patient is discharged from hospital.  The above assessment, treatment plan, treatment alternatives and goals were discussed and mutually agreed upon: by patient and by family  Envy Meno 01/12/2021, 12:12 PM

## 2021-01-12 NOTE — Progress Notes (Signed)
Physical Therapy Session Note  Patient Details  Name: Jane Navarro MRN: [id number] Date of Birth: 1945-10-13  Today's Date: 01/12/2021 PT Individual Time: 7262-0355 PT Individual Time Calculation (min): 59 min   Short Term Goals: Week 1:  PT Short Term Goal 1 (Week 1): Pt will ascend/descend 4 6" steps w/BUE support and min A PT Short Term Goal 2 (Week 1): Pt will ambulate 25' w/LRAD and min A PT Short Term Goal 3 (Week 1): Pt will self-propel 50' in [facility] w/CGA  Skilled Therapeutic Interventions/Progress Updates:  Pt received supine in bed, denied pain but reported high levels of fatigue. Daughter present for session. Donned thigh-high ted hose w/max A and BP in supine at 136/79 mmHg. Pt performed supine <>sit EOB w/CGA and immediately reported "weakness" and requested to lay back down. BP at 116/68 mmHG, educated pt on diaphragmatic breathing and pt able to demonstrate for 5 min. After 5 min of deep breathing in supine, BP at 124/72 mmHg and pt no longer symptomatic. Pt reported being able to tolerate bed-level exercises only, emphasis of session on improved LE ROM and strength. Encouraged pt to perform exercises throughout day in between sessions for improved stamina and cardiovascular response.   Pt performed the following exercises supine in bed w/S*:  -Straight leg raises, x10 per side  -Alt. Snow angels, x10 per side  -Ankle pumps x20 per side  -Hip adduction w/towel x15 w/5s isometric hold  -Hip abduction w/red theraband, 2x10, 5s isometric hold   Pt was left supine in bed, all needs in reach.   Therapy Documentation Precautions:  Precautions Precautions: Fall Required Braces or Orthoses: Other Brace Other Brace: pt wears neoprene type sleeve on B knees due to arthritic pain Restrictions Weight Bearing Restrictions: No RLE Weight Bearing: Weight bearing as tolerated LLE Weight Bearing: Weight bearing as tolerated   Therapy/Group: Individual Therapy Jane Navarro Jane Navarro, PT,  DPT  01/12/2021, 1:30 PM

## 2021-01-12 NOTE — Progress Notes (Addendum)
Advanced Heart Failure Rounding Note  PCP-Cardiologist: Parke Poisson, MD   Subjective:   01/11/21 Moved to CIR   Complaining of fatigue. Denies SOB.    Objective:   Weight Range: 65.8 kg Body mass index is 28.33 kg/m.   Vital Signs:   Temp:  [97.6 F (36.4 C)-98.7 F (37.1 C)] 98.5 F (36.9 C) (10/11 0506) Pulse Rate:  [66-69] 67 (10/11 0506) Resp:  [16-20] 16 (10/11 0506) BP: (115-132)/(75-78) 132/78 (10/11 0506) SpO2:  [94 %-96 %] 95 % (10/11 0506) Weight:  [65.8 kg] 65.8 kg (10/10 1737) Last BM Date: 01/09/21  Weight change: Filed Weights   01/11/21 1649 01/11/21 1737  Weight: 65.8 kg 65.8 kg    Intake/Output:   Intake/Output Summary (Last 24 hours) at 01/12/2021 1021 Last data filed at 01/12/2021 0741 Gross per 24 hour  Intake 440 ml  Output --  Net 440 ml      Physical Exam    General:  Sitting in the wheel chair. No resp difficulty HEENT: Normal Neck: Supple. JVP flat . Carotids 2+ bilat; no bruits. No lymphadenopathy or thyromegaly appreciated. Cor: PMI nondisplaced. Regular rate & rhythm. No rubs, gallops or murmurs. Lungs: Clear Abdomen: Soft, nontender, nondistended. No hepatosplenomegaly. No bruits or masses. Good bowel sounds. Extremities: No cyanosis, clubbing, rash, edema Neuro: Alert & orientedx3, cranial nerves grossly intact. moves all 4 extremities w/o difficulty. Affect pleasant   Labs    CBC Recent Labs    01/11/21 0438 01/12/21 0400  WBC 5.9 5.6  NEUTROABS  --  3.9  HGB 9.3* 9.0*  HCT 29.4* 29.0*  MCV 94.8 97.0  PLT 395 397   Basic Metabolic Panel Recent Labs    13/08/65 0438 01/12/21 0400  NA 135 136  K 4.0 3.6  CL 104 104  CO2 25 25  GLUCOSE 82 77  BUN 10 11  CREATININE 0.63 0.66  CALCIUM 8.2* 8.3*  MG 2.1 2.1   Liver Function Tests Recent Labs    01/11/21 0438 01/12/21 0400  AST 39 32  ALT 61* 51*  ALKPHOS 79 71  BILITOT 1.1 1.2  PROT 5.0* 4.7*  ALBUMIN 2.0* 1.9*   No results for  input(s): LIPASE, AMYLASE in the last 72 hours. Cardiac Enzymes No results for input(s): CKTOTAL, CKMB, CKMBINDEX, TROPONINI in the last 72 hours.  BNP: BNP (last 3 results) Recent Labs    12/29/20 0014  BNP >4,500.0*    ProBNP (last 3 results) No results for input(s): PROBNP in the last 8760 hours.   D-Dimer No results for input(s): DDIMER in the last 72 hours. Hemoglobin A1C No results for input(s): HGBA1C in the last 72 hours. Fasting Lipid Panel No results for input(s): CHOL, HDL, LDLCALC, TRIG, CHOLHDL, LDLDIRECT in the last 72 hours. Thyroid Function Tests Recent Labs    01/12/21 0400  TSH 6.838*    Other results:   Imaging    No results found.   Medications:     Scheduled Medications:  aspirin EC  81 mg Oral Daily   atorvastatin  80 mg Oral Daily   Chlorhexidine Gluconate Cloth  6 each Topical Daily   digoxin  0.125 mg Oral Daily   docusate sodium  200 mg Oral Daily   enoxaparin (LOVENOX) injection  40 mg Subcutaneous Q24H   ezetimibe  10 mg Oral Daily   feeding supplement  237 mL Oral BID BM   levothyroxine  100 mcg Oral Q0600   losartan  12.5 mg Oral  Daily   magnesium hydroxide  30 mL Oral Daily   senna  2 tablet Oral BID   sodium chloride flush  10-40 mL Intracatheter Q12H   spironolactone  25 mg Oral Daily   thiamine  100 mg Oral Daily   ticagrelor  90 mg Oral BID    Infusions:   PRN Medications: acetaminophen, alum & mag hydroxide-simeth, bisacodyl, diphenhydrAMINE, guaiFENesin-dextromethorphan, lip balm, melatonin, oxyCODONE-acetaminophen, polyethylene glycol, prochlorperazine **OR** prochlorperazine **OR** prochlorperazine, sodium chloride, sodium chloride flush, sodium phosphate    Patient Profile  Admitted 9/27 with NSTEMI complicated by cardiogenic shock.   Assessment/Plan  1. 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 ECHO EF 25-30% and severely reduced RV function.  - LHC with severe L main with  multivessel  proximal LAD and mild RCA .  - In the cath lab PA sat < 40%., CI 1.6.  Placed Impella 2.5 placed and started on milrinone  - Felt too high risk for CABG.  - 9/30 PCI to RCA with DES x 3, deferred intervention on left main with difficulty wiring the LAD and non-flow limiting dissection.  Can consider repeat PCI attempt (versus off-pump LIMA) down the road as she improves - Continue ASA 81, ticagrelor, atorvastatin. - No chest pain.    2.  Acute systolic HF ->  Cardiogenic shock:  - Ischemic cardiomyopathy.  Echo with EF 25-30%, severely decreased RV function, moderate-severe TR, moderate MR. Required MCS w/ Impella. Impella removed 10/3.  -  Diuretics on hold due to orthostasis. Volume status stable.  - Continue digoxin 0.125 mcg , dig level 0.5   - Continue spiro 25 mg  - Continue losartan 12.5 - Avoiding SGLT2i due to groin infections.  - No beta blocker d/t recent shock - Will hold off on further med titration for now d/t recurrent syncopal episodes. Somewhat frail.  - Continue ted hose.   3. Shock Liver  -Resolved     4. Anemia  - Hgb dropped to 6.5 initially>> given 2UPRBCS with appropriate rise to 8.9.  - 1U RBCs given 9/29.   - Significant hematoma left groin post-intervention, had 2 units PRBCs 9/30 with appropriate increase in hgb. IABP removed 10/3. Site remains stable  - Hgb 9   5. Thrombocytopenia due to Impella - resolved   6. H/O AKI:  - resolved - Renal function normalized.    7. Debility - CIR appreciated.     Length of Stay: 1  Amy Clegg, NP  01/12/2021, 10:21 AM  Advanced Heart Failure Team Pager [phone] (M-F; 7a - 5p)  Please contact [facility] for night-coverage after hours (5p -7a ) and weekends on [email or web address]   Patient seen and examined with the above-signed Advanced Practice Provider and/or Housestaff. I personally reviewed laboratory data, imaging studies and relevant notes. I independently examined the patient and formulated  the important aspects of the plan. I have edited the note to reflect any of my changes or salient points. I have personally discussed the plan with the patient and/or family.  Remains weak. Denies CP, SOB, orthopnea or PND. No recurrent vagal episodes. Orthostatics ok   General:  Elderly appearing. No resp difficulty HEENT: normal Neck: supple. no JVD. Carotids 2+ bilat; no bruits. No lymphadenopathy or thryomegaly appreciated. Cor: PMI nondisplaced. Regular rate & rhythm. No rubs, gallops or murmurs. Lungs: clear Abdomen: soft, nontender, nondistended. No hepatosplenomegaly. No bruits or masses. Good bowel sounds. Extremities: no cyanosis, clubbing, rash, edema Neuro: alert & orientedx3, cranial nerves grossly intact. moves  all 4 extremities w/o difficulty. Affect pleasant  Orthostatics ok. Volume status ok. No s/s angina. Continue current regimen. Appreciate CIR care.   Arvilla Meres, MD  4:36 PM

## 2021-01-12 NOTE — Evaluation (Signed)
Physical Therapy Assessment and Plan  Patient Details  Name: Jane Navarro MRN: [id number] Date of Birth: 1946/01/19  PT Diagnosis: Abnormality of gait, Difficulty walking, Muscle weakness, Osteoarthritis, and Vertigo Rehab Potential: Good ELOS: 10-12 days   Today's Date: 01/12/2021 PT Individual Time: 1194-1740 PT Individual Time Calculation (min): 54 min    Hospital Problem: Principal Problem:   Debility   Past Medical History:  Past Medical History:  Diagnosis Date   Anemia    CVA (cerebral vascular accident) ([place])    HLD (hyperlipidemia)    Hypothyroidism    Tobacco abuse, in remission    Past Surgical History:  Past Surgical History:  Procedure Laterality Date   CENTRAL LINE INSERTION  12/29/2020   Procedure: CENTRAL LINE INSERTION;  Surgeon: Martinique, Peter M, MD;  Location: [place] [facility];  Service: Cardiovascular;;   CORONARY STENT INTERVENTION N/A 01/01/2021   Procedure: CORONARY STENT INTERVENTION;  Surgeon: Early Osmond, MD;  Location: [place] [facility];  Service: Cardiovascular;  Laterality: N/A;   INTRAVASCULAR ULTRASOUND/IVUS N/A 01/01/2021   Procedure: Intravascular Ultrasound/IVUS;  Surgeon: Early Osmond, MD;  Location: [place] [facility];  Service: Cardiovascular;  Laterality: N/A;   RIGHT/LEFT HEART CATH AND CORONARY ANGIOGRAPHY N/A 12/29/2020   Procedure: RIGHT/LEFT HEART CATH AND CORONARY ANGIOGRAPHY;  Surgeon: Martinique, Peter M, MD;  Location: [place] [facility];  Service: Cardiovascular;  Laterality: N/A;   TEE WITHOUT CARDIOVERSION N/A 12/11/2020   Procedure: TRANSESOPHAGEAL ECHOCARDIOGRAM (TEE);  Surgeon: Josue Hector, MD;  Location: [facility] ENDOS[facility];  Service: Cardiovascular;  Laterality: N/A;   TOTAL ABDOMINAL HYSTERECTOMY     VENTRICULAR ASSIST DEVICE INSERTION N/A 12/29/2020   Procedure: VENTRICULAR ASSIST DEVICE INSERTION;  Surgeon: Martinique, Peter M, MD;  Location: [place] [facility];  Service: Cardiovascular;  Laterality: N/A;    Assessment &  Plan Clinical Impression: Patient is a 75 y.o. year old female with history of CVA, vertigo, OA bilateral knees, recent admission for encephalopathy due to hypothyroidism who was admitted on 12/28/20 with reports of progressive decline since d/c from [employer] three days PTA. Family reported decrease in intake as well as some chest pain PTA. She was found to be hypotensive with new LBBB, elevated troponins due to NSTEMI, UTI and AKI. She was treated with IV antibiotics and 2D echo showed new reduction in EF to 25-30% with moderate to severe TVR as well as small hypermobile filament in LV.Marland Kitchen  She was found to have cardiogenic shock and underwent urgent catherizaton by Dr. Martinique revealing severe Left main and multivessel disease in  proximal LAD and mid RCX. She was required milirione and impella placed  by Dr.Bemsimhon for management of shock.  She did have  issues with drop in H/H requiring 3 total units of PRBC.     Medical treatment recommended as patient felt to be poor surgical candidate and she underwent PCI with RCA with three overlapping stents  by Dr. Johnsie Cancel on 09/30 with plans for LAD intervention in the future.  Post procedure with hypotension and bleeding with hematoma left groin as well as drop in platelets to 54. 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   Impella removed on 10/04 but has continued to have issue with hypotension requiring NE resumption and milirionone weaned off.  Medications being adjusted due to ongoing issues with issues with orthostasis and intermittent syncope --last on 10/09.   Therapy ongoing and she continues to be limited  by weakness with shuffling gait, balance deficits with posterior lean, fear as well as fatigue affecting mobility and ADLs. She was able to sit up in a chair for 2 hours yesterday but limited by syncopal episode. CIR recommended due to functional decline. She currently has no new complaints.    Patient currently requires min with mobility secondary to muscle weakness, decreased cardiorespiratoy endurance, and decreased postural control and decreased balance strategies.  Prior to hospitalization, patient was modified independent  with mobility and lived with Son in a House home.  Home access is 4-5Stairs to enter (Son can build ramp).  Patient will benefit from skilled PT intervention to maximize safe functional mobility, minimize fall risk, and decrease caregiver burden for planned discharge home with intermittent assist.  Anticipate patient will benefit from follow up [facility] at discharge.  PT - End of Session Activity Tolerance: Tolerates < 10 min activity with changes in vital signs Endurance Deficit: Yes Endurance Deficit Description: pt becomes very fatigued after a few minutes, standing tolerance only 30 seconds, orthostatic with positional changes PT Assessment Rehab Potential (ACUTE/IP ONLY): Good PT Barriers to Discharge: Decreased caregiver support;Home environment access/layout PT Barriers to Discharge Comments: Significant cardiovascular deconditioning PT Patient demonstrates impairments in the following area(s): Endurance PT Transfers Functional Problem(s): Bed Mobility;Bed to Chair;Car;Furniture PT Locomotion Functional Problem(s): Ambulation;Wheelchair Mobility;Stairs PT Plan PT Intensity: Minimum of 1-2 x/day ,45 to 90 minutes PT Frequency: Total of 15 hours over 7 days of combined therapies PT Duration Estimated Length of Stay: 10-12 days PT Treatment/Interventions: Ambulation/gait training;Community reintegration;Neuromuscular re-education;Psychosocial support;DME/adaptive equipment instruction;Stair training;UE/LE Strength taining/ROM;Wheelchair propulsion/positioning;UE/LE Coordination activities;Therapeutic Activities;Skin care/wound management;Pain management;Functional electrical stimulation;Discharge planning;Balance/vestibular training;Cognitive  remediation/compensation;Disease management/prevention;Functional mobility training;Patient/family education;Splinting/orthotics;Therapeutic Exercise;Visual/perceptual remediation/compensation PT Transfers Anticipated Outcome(s): S* PT Locomotion Anticipated Outcome(s): S* PT Recommendation Follow Up Recommendations: Home health PT Patient destination: Home Equipment Recommended: To be determined   PT Evaluation Precautions/Restrictions Precautions Precautions: Fall Required Braces or Orthoses: Other Brace Other Brace: pt wears neoprene type sleeve on B knees due to arthritic pain Restrictions Weight Bearing Restrictions: No RLE Weight Bearing: Weight bearing as tolerated LLE Weight Bearing: Weight bearing as tolerated Pain Interference Pain Interference Pain Effect on Sleep: 1. Rarely or not at all Pain Interference with Therapy Activities: 1. Rarely or not at all Pain Interference with Day-to-Day Activities: 1. Rarely or not at all Home Living/Prior [place] Living Arrangements: Children Available Help at Discharge: Family;Available PRN/intermittently Type of Home: House Home Access: Stairs to enter (Son can build ramp) Technical brewer of Steps: 4-5 Entrance Stairs-Rails: Right Home Layout: One level Bathroom Shower/Tub: Tub/shower unit;Walk-in shower (Pt's primary bathroom has tub shower but she has access to son's walk-in shower) Bathroom Toilet: Standard Bathroom Accessibility: Yes  Lives With: Son Prior Function Level of Independence: Requires assistive device for independence  Able to Take Stairs?: Yes Driving: Yes Vocation: Retired Radiographer, therapeutic - History Ability to See in Adequate Light: 0 Adequate Perception Perception: Within Functional Limits Praxis Praxis: Intact  Cognition Overall Cognitive Status: Within Functional Limits for tasks assessed Orientation Level: Oriented X4 Safety/Judgment: Appears  intact Sensation Sensation Light Touch: Appears Intact Coordination Finger Nose Finger Test: Slowed but [facility] Heel Shin Test: [facility] Motor  Motor Motor: Within Functional Limits Motor - Skilled Clinical Observations: Impacted by fatigue   Trunk/Postural Assessment  Cervical Assessment Cervical Assessment: Exceptions to [facility] (Forward head) Thoracic Assessment Thoracic Assessment: Exceptions to [facility] (Kyphotic) Lumbar Assessment Lumbar Assessment: Exceptions to [facility] (Posterior pelvic tilt) Postural Control Postural Control: Within Functional  Limits  Balance Balance Balance Assessed: Yes Static Sitting Balance Static Sitting - Balance Support: Feet supported;Bilateral upper extremity supported Static Sitting - Level of Assistance: 6: Modified independent (Device/Increase time) Dynamic Sitting Balance Dynamic Sitting - Balance Support: During functional activity;Feet supported Dynamic Sitting - Level of Assistance: 5: Stand by assistance Dynamic Sitting - Balance Activities: Reaching for objects Static Standing Balance Static Standing - Balance Support: During functional activity;Bilateral upper extremity supported Static Standing - Level of Assistance: 4: Min assist Dynamic Standing Balance Dynamic Standing - Balance Support: Bilateral upper extremity supported;During functional activity Dynamic Standing - Level of Assistance: 4: Min assist Extremity Assessment  RLE Assessment RLE Assessment: Exceptions to [facility] RLE Strength Right Hip Flexion: 4/5 Right Hip Extension: 4/5 Right Hip ABduction: 4/5 Right Hip ADduction: 4/5 Right Knee Flexion: 4/5 Right Knee Extension: 4/5 Right Ankle Dorsiflexion: 4/5 Right Ankle Plantar Flexion: 4/5 LLE Assessment LLE Assessment: Exceptions to [facility] LLE Strength Left Hip Flexion: 4-/5 Left Hip Extension: 4/5 Left Hip ABduction: 4/5 Left Hip ADduction: 4/5 Left Knee Flexion: 4/5 Left Knee Extension: 4/5 Left Ankle Dorsiflexion: 4/5 Left Ankle  Plantar Flexion: 4/5 Left Ankle Inversion: 4/5  Care Tool Care Tool Bed Mobility Roll left and right activity   Roll left and right assist level: Contact Guard/Touching assist    Sit to lying activity   Sit to lying assist level: Contact Guard/Touching assist    Lying to sitting on side of bed activity   Lying to sitting on side of bed assist level: the ability to move from lying on the back to sitting on the side of the bed with no back support.: Contact Guard/Touching assist     Care Tool Transfers Sit to stand transfer   Sit to stand assist level: Minimal Assistance - Patient > 75%    Chair/bed transfer   Chair/bed transfer assist level: Minimal Assistance - Patient > 75%     Psychologist, counselling transfer activity did not occur: Safety/medical concerns (Fatigue, syncope)        Care Tool Locomotion Ambulation Ambulation activity did not occur: Safety/medical concerns (Fatigue, syncope)        Walk 10 feet activity Walk 10 feet activity did not occur: Safety/medical concerns (Fatigue, syncope)       Walk 50 feet with 2 turns activity Walk 50 feet with 2 turns activity did not occur: Safety/medical concerns (Fatigue, syncope)      Walk 150 feet activity Walk 150 feet activity did not occur: Safety/medical concerns (Fatigue, syncope)      Walk 10 feet on uneven surfaces activity Walk 10 feet on uneven surfaces activity did not occur: Safety/medical concerns (Fatigue, syncope)      Stairs Stair activity did not occur: Safety/medical concerns (Fatigue, syncope)        Walk up/down 1 step activity Walk up/down 1 step or curb (drop down) activity did not occur: Safety/medical concerns (Fatigue, syncope)     Walk up/down 4 steps activity did not occuR: Safety/medical concerns (Fatigue, syncope)  Walk up/down 4 steps activity      Walk up/down 12 steps activity Walk up/down 12 steps activity did not occur: Safety/medical concerns (Fatigue,  syncope)      Pick up small objects from floor Pick up small object from the floor (from standing position) activity did not occur: Safety/medical concerns (Fatigue, syncope)      Wheelchair Is the patient using a wheelchair?: Yes Type of Wheelchair: Manual  Wheelchair activity did not occur: Safety/medical concerns (Fatigue, syncope)      Wheel 50 feet with 2 turns activity Wheelchair 50 feet with 2 turns activity did not occur: Safety/medical concerns (Fatigue, syncope)    Wheel 150 feet activity Wheelchair 150 feet activity did not occur: Safety/medical concerns (Fatigue, syncope)      Refer to Care Plan for Long Term Goals  SHORT TERM GOAL WEEK 1 PT Short Term Goal 1 (Week 1): Pt will ascend/descend 4 6" steps w/BUE support and min A PT Short Term Goal 2 (Week 1): Pt will ambulate 25' w/LRAD and min A PT Short Term Goal 3 (Week 1): Pt will self-propel 50' in [facility] w/CGA  Recommendations for other services: None   Skilled Therapeutic Intervention Evaluation completed (see details above and below) with education on PT POC, safety policies, CIR 3hr therapy requirement and goals. Pt received received seated in WC in room w/daughter and CSW. Pt denied pain but reported high level of fatigue following OT session. Discussed with pt and family 15/7 schedule, pt and family in agreement that 15/7 is better for them, team notified. Pt verbalized fear of falling following several syncope incidents during her time in ICU. After lots of encouragement, pt was able to perform sit <>stand pivot from [facility] to bed w/min A and RW, extra time required for initiation. BP at 121/78 mmHg. Pt performed bed mobility w/CGA and use of bedrail and was left supine in bed, all needs in reach.   Mobility Bed Mobility Bed Mobility: Rolling Right;Rolling Left;Supine to Sit;Sit to Supine;Scooting to [facility] Rolling Right: Contact Guard/Touching assist Rolling Left: Contact Guard/Touching assist Supine to Sit: Contact  Guard/Touching assist Sit to Supine: Contact Guard/Touching assist Scooting to HOB: Contact Guard/Touching assist Transfers Transfers: Sit to Stand;Stand to Sit;Stand Pivot Transfers Sit to Stand: Minimal Assistance - Patient > 75% Stand to Sit: Minimal Assistance - Patient > 75% Stand Pivot Transfers: Minimal Assistance - Patient > 75% Stand Pivot Transfer Details: Tactile cues for sequencing;Tactile cues for posture;Tactile cues for weight shifting;Verbal cues for safe use of DME/AE Transfer (Assistive device): Rolling walker Locomotion  Gait Ambulation: No Gait Gait: No Gait velocity: decreased Stairs / Additional Locomotion Stairs: No Wheelchair Mobility Wheelchair Mobility: No   Discharge Criteria: Patient will be discharged from PT if patient refuses treatment 3 consecutive times without medical reason, if treatment goals not met, if there is a change in medical status, if patient makes no progress towards goals or if patient is discharged from hospital.  The above assessment, treatment plan, treatment alternatives and goals were discussed and mutually agreed upon: by patient and by family  Cruzita Lederer Torra Pala, PT, DPT 01/12/2021, 11:01 AM

## 2021-01-12 NOTE — Progress Notes (Signed)
PROGRESS NOTE   Subjective/Complaints:  Slept well, daughter at bedside.  Daughter lives near [place], one son in [place] and one in [place].  Plans are to go to [facility]  ROS- neg CP, SOB, N/V/D  Objective:   No results found. Recent Labs    01/11/21 0438 01/12/21 0400  WBC 5.9 5.6  HGB 9.3* 9.0*  HCT 29.4* 29.0*  PLT 395 397   Recent Labs    01/11/21 0438 01/12/21 0400  NA 135 136  K 4.0 3.6  CL 104 104  CO2 25 25  GLUCOSE 82 77  BUN 10 11  CREATININE 0.63 0.66  CALCIUM 8.2* 8.3*    Intake/Output Summary (Last 24 hours) at 01/12/2021 0829 Last data filed at 01/12/2021 0741 Gross per 24 hour  Intake 440 ml  Output --  Net 440 ml        Physical Exam: Vital Signs Blood pressure 132/78, pulse 67, temperature 98.5 F (36.9 C), resp. rate 16, height 5' (1.524 m), weight 65.8 kg, SpO2 95 %.    Assessment/Plan: 1. Functional deficits which require 3+ hours per day of interdisciplinary therapy in a comprehensive inpatient rehab setting. Physiatrist is providing close team supervision and 24 hour management of active medical problems listed below. Physiatrist and rehab team continue to assess barriers to discharge/monitor patient progress toward functional and medical goals  Care Tool:  Bathing              Bathing assist       Upper Body Dressing/Undressing Upper body dressing        Upper body assist Assist Level: Minimal Assistance - Patient > 75%    Lower Body Dressing/Undressing Lower body dressing      What is the patient wearing?: Incontinence brief     Lower body assist Assist for lower body dressing: Maximal Assistance - Patient 25 - 49%     Toileting Toileting    Toileting assist Assist for toileting: Minimal Assistance - Patient > 75%     Transfers Chair/bed transfer  Transfers assist           Locomotion Ambulation   Ambulation assist               Walk 10 feet activity   Assist           Walk 50 feet activity   Assist           Walk 150 feet activity   Assist           Walk 10 feet on uneven surface  activity   Assist           Wheelchair     Assist               Wheelchair 50 feet with 2 turns activity    Assist            Wheelchair 150 feet activity     Assist          Blood pressure 132/78, pulse 67, temperature 98.5 F (36.9 C), resp. rate 16, height 5' (1.524 m), weight 65.8 kg, SpO2 95 %.  Medical Problem List and  Plan: 1.  Cardiopulmonary debility s/p NSTEMI, CHF             -patient may shower but incision must be covered             -ELOS/Goals: 10-14 days modI/S 2.  Impaired mobility: -DVT/anticoagulation:  Pharmaceutical: Lovenox             -antiplatelet therapy: Continue ASA/Brillinta 3. Postoperative pain: Continue Oxycodone prn  4. Mood: LCSW to follow for evaluation and support.              -antipsychotic agents: N/a 5. Neuropsych: This patient is capable of making decisions on her own behalf. 6. Skin/Wound Care: Routine pressure relief measures.  7. Fluids/Electrolytes/Nutrition: Monitor I/O. BUN , lytes nl 10/11 8. NSTEMI/CAD s/p PCI/stent: Monitor for symptoms with increase in activity.        9. Acute systolic HF: Heart healthy diet. Check daily weights--admit wt 141-->150 lbs today. --Monitor for symptoms of overload.  --On spironolactone, Cozaar, Digoxin, ASA/Brillinta and Lipitor  10. Hypothyroid: On supplement for past month.  TSH ordered per cards             --Improving from 41.4-->4.8 in three weeks.  11. AKI: Has resolved--recheck BMET wnl 12. Shocked liver: Abnormal LFTs are resolving--recheck LFTs in am.  13. Acute blood loss anemia: Groin hematomas appear resolved.  --H/H stable around 9.3. Recheck CBC in am.  14. Low albumin levels: Continue Ensure BID--family encouraged to bring low salt foods from home. 15.  Bilateral Knee pain: Voltaren gel added to help manage symptoms.  16. Constipation: Will increase Senna to bid (does not like daily miralax-->d/c and continue MOM daily. Suppository today as reports 0 and  17. Orthostasis w/syncope: ortho vitals normal yesterday pm              --change to THT and order abdominal binder.              --encourage oral intake.  18. Overweight BMI 28.33: provide dietary education    LOS: 1 days A FACE TO FACE EVALUATION WAS PERFORMED  Erick Colace 01/12/2021, 8:29 AM

## 2021-01-12 NOTE — Progress Notes (Signed)
Occupational Therapy Session Note  Patient Details  Name: Jane Navarro MRN: [id number] Date of Birth: 1945-07-19  Today's Date: 01/12/2021 OT Individual Time: 1505-1530 (25 mins)   Short Term Goals: Week 1:  OT Short Term Goal 1 (Week 1): Pt will be able to complete toilet transfers with min A. OT Short Term Goal 2 (Week 1): Pt will be able to complete 3/3 toileting tasks with min A. OT Short Term Goal 3 (Week 1): Pt will don pants with min A by being able to pull pants over hips herself. OT Short Term Goal 4 (Week 1): Pt will demonstrate improved endurance by tolerating standing at sink to brush her teeth.  Skilled Therapeutic Interventions/Progress Updates:  Pt greeted supine in bed agreeable to OT intervention, but wanting to remain in bed d/t fatigue. Session focus on bed mobility as precursor to higher level functional mobility tasks. Vital signs taken throughout session; see below. Pt rolling R<>L with MIN A with pt needing rest break in between each roll d/t fatigue. Able to reposition pt bed pad and slide pt up in bed with total A +2. Pt declined OOB activity at this time d/t energy level. pt left with HOB elevated to 35 degrees with all needs within reach and bed alarm set.          Supine 121/78 HR 69 Supine after rolling 124/86 HR 84 Supine after scooting up in bed 137/68 HR 67         Therapy Documentation Precautions:  Precautions Precautions: Fall Required Braces or Orthoses: Other Brace Other Brace: pt wears neoprene type sleeve on B knees due to arthritic pain Restrictions Weight Bearing Restrictions: No RLE Weight Bearing: Weight bearing as tolerated LLE Weight Bearing: Weight bearing as tolerated  Pain: pt reports no pain during session     Therapy/Group: Individual Therapy  Barron Schmid 01/12/2021, 3:51 PM

## 2021-01-12 NOTE — Progress Notes (Signed)
Inpatient Rehabilitation Center Individual Statement of Services  Patient Name:  Jane Navarro  Date:  01/12/2021  Welcome to the Inpatient Rehabilitation Center.  Our goal is to provide you with an individualized program based on your diagnosis and situation, designed to meet your specific needs.  With this comprehensive rehabilitation program, you will be expected to participate in at least 3 hours of rehabilitation therapies Monday-Friday, with modified therapy programming on the weekends.  Your rehabilitation program will include the following services:  Physical Therapy (PT), Occupational Therapy (OT), Speech Therapy (ST), 24 hour per day rehabilitation nursing, Neuropsychology, Care Coordinator, Rehabilitation Medicine, Nutrition Services, and Pharmacy Services  Weekly team conferences will be held on Tuesday to discuss your progress.  Your Inpatient Rehabilitation Care Coordinator will talk with you frequently to get your input and to update you on team discussions.  Team conferences with you and your family in attendance may also be held.  Expected length of stay: 10-12 Days  Overall anticipated outcome: supervision-mod/I level  Depending on your progress and recovery, your program may change. Your Inpatient Rehabilitation Care Coordinator will coordinate services and will keep you informed of any changes. Your Inpatient Rehabilitation Care Coordinator's name and contact numbers are listed  below.  The following services may also be recommended but are not provided by the Inpatient Rehabilitation Center:  Driving Evaluations Home Health Rehabiltiation Services Outpatient Rehabilitation Services    Arrangements will be made to provide these services after discharge if needed.  Arrangements include referral to agencies that provide these services.  Your insurance has been verified to be:  [employer] Your primary doctor is:  Linzie Collin  Pertinent information will be shared  with your doctor and your insurance company.  Inpatient Rehabilitation Care Coordinator:  Dossie Der, [place] [phone] or Luna Glasgow  Information discussed with and copy given to patient by: [name], 01/12/2021, 10:50 AM

## 2021-01-12 NOTE — Plan of Care (Signed)
  Problem: RH Balance Goal: LTG Patient will maintain dynamic standing balance (PT) Description: LTG:  Patient will maintain dynamic standing balance with assistance during mobility activities (PT) Flowsheets (Taken 01/12/2021 1607) LTG: Pt will maintain dynamic standing balance during mobility activities with:: (LRAD) Supervision/Verbal cueing   Problem: Sit to Stand Goal: LTG:  Patient will perform sit to stand with assistance level (PT) Description: LTG:  Patient will perform sit to stand with assistance level (PT) Flowsheets (Taken 01/12/2021 1607) LTG: PT will perform sit to stand in preparation for functional mobility with assistance level: (LRAD) Supervision/Verbal cueing   Problem: RH Bed Mobility Goal: LTG Patient will perform bed mobility with assist (PT) Description: LTG: Patient will perform bed mobility with assistance, with/without cues (PT). Flowsheets (Taken 01/12/2021 1607) LTG: Pt will perform bed mobility with assistance level of: Independent with assistive device    Problem: RH Bed to Chair Transfers Goal: LTG Patient will perform bed/chair transfers w/assist (PT) Description: LTG: Patient will perform bed to chair transfers with assistance (PT). Flowsheets (Taken 01/12/2021 1607) LTG: Pt will perform Bed to Chair Transfers with assistance level: (LRAD) Supervision/Verbal cueing   Problem: RH Car Transfers Goal: LTG Patient will perform car transfers with assist (PT) Description: LTG: Patient will perform car transfers with assistance (PT). Flowsheets (Taken 01/12/2021 1607) LTG: Pt will perform car transfers with assist:: (LRAD) Supervision/Verbal cueing   Problem: RH Furniture Transfers Goal: LTG Patient will perform furniture transfers w/assist (OT/PT) Description: LTG: Patient will perform furniture transfers  with assistance (OT/PT). Flowsheets (Taken 01/12/2021 1607) LTG: Pt will perform furniture transfers with assist:: (LRAD) Supervision/Verbal cueing    Problem: RH Ambulation Goal: LTG Patient will ambulate in home environment (PT) Description: LTG: Patient will ambulate in home environment, # of feet with assistance (PT). Flowsheets (Taken 01/12/2021 1607) LTG: Pt will ambulate in home environ  assist needed:: (LRAD) Supervision/Verbal cueing LTG: Ambulation distance in home environment: 50'   Problem: RH Wheelchair Mobility Goal: LTG Patient will propel w/c in controlled environment (PT) Description: LTG: Patient will propel wheelchair in controlled environment, # of feet with assist (PT) Flowsheets (Taken 01/12/2021 1607) LTG: Pt will propel w/c in controlled environ  assist needed:: Supervision/Verbal cueing LTG: Propel w/c distance in controlled environment: 75' Goal: LTG Patient will propel w/c in home environment (PT) Description: LTG: Patient will propel wheelchair in home environment, # of feet with assistance (PT). Flowsheets (Taken 01/12/2021 1607) LTG: Pt will propel w/c in home environ  assist needed:: Supervision/Verbal cueing Distance: wheelchair distance in controlled environment: 50 LTG: Propel w/c distance in home environment: 50   Problem: RH Stairs Goal: LTG Patient will ambulate up and down stairs w/assist (PT) Description: LTG: Patient will ambulate up and down # of stairs with assistance (PT) Flowsheets (Taken 01/12/2021 1607) LTG: Pt will ambulate up/down stairs assist needed:: Supervision/Verbal cueing LTG: Pt will  ambulate up and down number of stairs: 5

## 2021-01-12 NOTE — Progress Notes (Signed)
Inpatient Rehabilitation Care Coordinator Assessment and Plan Patient Details  Name: Jane Navarro MRN: [id number] Date of Birth: 01/25/1946  Today's Date: 01/12/2021  Hospital Problems: Principal Problem:   Debility  Past Medical History:  Past Medical History:  Diagnosis Date   Anemia    CVA (cerebral vascular accident) (HCC)    HLD (hyperlipidemia)    Hypothyroidism    Tobacco abuse, in remission    Past Surgical History:  Past Surgical History:  Procedure Laterality Date   CENTRAL LINE INSERTION  12/29/2020   Procedure: CENTRAL LINE INSERTION;  Surgeon: Swaziland, Peter M, MD;  Location: [facility] INVASIVE CV LAB;  Service: Cardiovascular;;   CORONARY STENT INTERVENTION N/A 01/01/2021   Procedure: CORONARY STENT INTERVENTION;  Surgeon: [name], MD;  Location: [facility] INVASIVE CV LAB;  Service: Cardiovascular;  Laterality: N/A;   INTRAVASCULAR ULTRASOUND/IVUS N/A 01/01/2021   Procedure: Intravascular Ultrasound/IVUS;  Surgeon: [name], MD;  Location: [facility] INVASIVE CV LAB;  Service: Cardiovascular;  Laterality: N/A;   RIGHT/LEFT HEART CATH AND CORONARY ANGIOGRAPHY N/A 12/29/2020   Procedure: RIGHT/LEFT HEART CATH AND CORONARY ANGIOGRAPHY;  Surgeon: Swaziland, Peter M, MD;  Location: [facility] INVASIVE CV LAB;  Service: Cardiovascular;  Laterality: N/A;   TEE WITHOUT CARDIOVERSION N/A 12/11/2020   Procedure: TRANSESOPHAGEAL ECHOCARDIOGRAM (TEE);  Surgeon: Wendall Stade, MD;  Location: [facility] ENDOSCOPY;  Service: Cardiovascular;  Laterality: N/A;   TOTAL ABDOMINAL HYSTERECTOMY     VENTRICULAR ASSIST DEVICE INSERTION N/A 12/29/2020   Procedure: VENTRICULAR ASSIST DEVICE INSERTION;  Surgeon: Swaziland, Peter M, MD;  Location: [facility] INVASIVE CV LAB;  Service: Cardiovascular;  Laterality: N/A;   Social History:  reports that she has quit smoking. She has quit using smokeless tobacco. She reports that she does not currently use alcohol. She reports that she does not use drugs.  Family / Support  Systems Marital Status: Divorced Patient Roles: Parent ChildrenMelford Aase [place]  [name] [phone] cell [place] Anticipated Caregiver: Melodye Ped both work Ability/Limitations of Caregiver: Both work [employer] on [employer] but are hopeful she will be mod/i to be safe alone while son works Medical laboratory scientific officer: Other (Comment) (Need to come up with a plan) Family Dynamics: Close with both children who are involved and supportive. Pt to move in with son in [place] he lives alone and works so at this time has no one to stay with during the day.  Social History Preferred language: English Religion:  Cultural Background: No issues Education: HS Health Literacy - How often do you need to have someone help you when you read instructions, pamphlets, or other written material from your doctor or pharmacy?: Sometimes Writes: Yes Employment Status: Retired Marine scientist Issues: No issues Guardian/Conservator: None-according to MD pt is capable of making her own decisions while here. Daughter reports both she and brother are her HCPOA   Abuse/Neglect Abuse/Neglect Assessment Can Be Completed: Yes Physical Abuse: Denies Verbal Abuse: Denies Sexual Abuse: Denies Exploitation of patient/patient's resources: Denies Self-Neglect: Denies  Patient response to: Social Isolation - How often do you feel lonely or isolated from those around you?: Rarely  Emotional Status Pt's affect, behavior and adjustment status: Pt is motivated to do well and recover she feels her main issue is fatigue. She needs time to do what is asked of her and rst breaks. She has always been independent and taken care of herself up until 9/27 when all this begain. Recent Psychosocial Issues: other health issues reports thyroid quit working she had  issues before but now are worse with this Psychiatric History: No history deferred depression screen due to coping appropriately and  expressing herself. Will continue to assess and see if would benefit from seeing neuro-psych while here Substance Abuse History: Tobacco plans to quit now aware of the health risks causes  Patient / Family Perceptions, Expectations & Goals Pt/Family understanding of illness & functional limitations: Pt and daughter can explain her health issues and what has transpired from 9/27 up til now. Both talk with the MD and feel they have a good understanding of her issues. Premorbid pt/family roles/activities: Mom, retiree, home owner, neighbor, etc Anticipated changes in roles/activities/participation: resume Pt/family expectations/goals: Pt states: " I need to build back my strength and am so fatigued."  Daughter states: " We hope she can get to the level she can stay alone while my brother works."  [employer]: Other (Comment) (Was at [facility] healthcare- until 9/24) Premorbid Home Care/DME Agencies: Other (Comment) ([facility] with [facility]) Transportation available at discharge: pt drove but will not be now, will rely upon children Is the patient able to respond to transportation needs?: Yes In the past 12 months, has lack of transportation kept you from medical appointments or from getting medications?: No In the past 12 months, has lack of transportation kept you from meetings, work, or from getting things needed for daily living?: No  Discharge Planning Living Arrangements: Children Support Systems: Children Type of Residence: Private residence Insurance Resources: Media planner (specify) Engineer, materials) Financial Resources: Restaurant manager, fast food Screen Referred: No Living Expenses: Lives with family Money Management: Patient, Family Does the patient have any problems obtaining your medications?: No Home Management: Children will be now Patient/Family Preliminary Plans: Going to live with son who lives in [place], daughter lives in [place].  Between both of them they can make sure gets to appointments and check on. Currently does not have 24/7 care. Daughter to get FMLA forms completed. When asked if would need 24/7 what is the plan, unsure at this time. Pt was in a SNF and due to managed care can not go to one from [facility] Care Coordinator Barriers to Discharge: Decreased caregiver support, Insurance for SNF coverage Care Coordinator Anticipated Follow Up Needs: HH/OP  Clinical Impression Pleasant female who is willing to work in therapies to recover as much of her independence as she can. She has had much happen to her in the past 6 weeks. Her two children are involved and the plan is to go to son's at discharge and live, but he does work and both daughter and son currently do not have a plan for 24/7 care.  [name] 01/12/2021, 10:45 AM

## 2021-01-12 NOTE — Progress Notes (Signed)
Inpatient Rehabilitation Center Individual Statement of Services  Patient Name:  Jane Navarro  Date:  01/12/2021  Welcome to the Inpatient Rehabilitation Center.  Our goal is to provide you with an individualized program based on your diagnosis and situation, designed to meet your specific needs.  With this comprehensive rehabilitation program, you will be expected to participate in at least 3 hours of rehabilitation therapies Monday-Friday, with modified therapy programming on the weekends.  Your rehabilitation program will include the following services:  Physical Therapy (PT), Occupational Therapy (OT), Speech Therapy (ST), 24 hour per day rehabilitation nursing, Therapeutic Recreaction (TR), Neuropsychology, Care Coordinator, Rehabilitation Medicine, Nutrition Services, Pharmacy Services, and Other  Weekly team conferences will be held on Wednesdays to discuss your progress.  Your Inpatient Rehabilitation Care Coordinator will talk with you frequently to get your input and to update you on team discussions.  Team conferences with you and your family in attendance may also be held.  Expected length of stay:  10-14 Days  Overall anticipated outcome:  MOD I to Supervision  Depending on your progress and recovery, your program may change. Your Inpatient Rehabilitation Care Coordinator will coordinate services and will keep you informed of any changes. Your Inpatient Rehabilitation Care Coordinator's name and contact numbers are listed  below.  The following services may also be recommended but are not provided by the Inpatient Rehabilitation Center:   Home Health Rehabiltiation Services Outpatient Rehabilitation Services    Arrangements will be made to provide these services after discharge if needed.  Arrangements include referral to agencies that provide these services.  Your insurance has been verified to be:   [employer] Your primary doctor is:   NO PCP  Pertinent information will  be shared with your doctor and your insurance company.  Inpatient Rehabilitation Care Coordinator:  Lavera Guise, [place] [phone] or [phone]  Information discussed with and copy given to patient by: Andria Rhein, 01/12/2021, 11:19 AM

## 2021-01-12 NOTE — Progress Notes (Signed)
Patient ID: Jane Navarro, female   DOB: 12-31-1945, 75 y.o.   MRN: [id number] Met with the patient and her daughter to introduce self and the role of the nurse CM. Discussed diagnosis and secondary risks including HTN, HLD, CKD, HF and meds with dietary modifications recommended. Discussed lactose intolerance with need for increased protein and calcium in her diet. Reviewed  ASA and Br[place] for perfusion with a stent.  Also reviewed DASH diet and daily weight check with the zone tool. Patient and daughter reported that cardiology also gave them the same information with a notebook; so all staff on her team are on the same page.  Continue to follow along to discharge to address educational needs and facilitate preparation for discharge. Margarito Liner

## 2021-01-13 LAB — CBC
HCT: 29.4 % — ABNORMAL LOW (ref 36.0–46.0)
Hemoglobin: 9.4 g/dL — ABNORMAL LOW (ref 12.0–15.0)
MCH: 30.3 pg (ref 26.0–34.0)
MCHC: 32 g/dL (ref 30.0–36.0)
MCV: 94.8 fL (ref 80.0–100.0)
Platelets: 385 10*3/uL (ref 150–400)
RBC: 3.1 MIL/uL — ABNORMAL LOW (ref 3.87–5.11)
RDW: 15.2 % (ref 11.5–15.5)
WBC: 6.2 10*3/uL (ref 4.0–10.5)
nRBC: 0 % (ref 0.0–0.2)

## 2021-01-13 LAB — BASIC METABOLIC PANEL
Anion gap: 8 (ref 5–15)
BUN: 11 mg/dL (ref 8–23)
CO2: 25 mmol/L (ref 22–32)
Calcium: 8.4 mg/dL — ABNORMAL LOW (ref 8.9–10.3)
Chloride: 104 mmol/L (ref 98–111)
Creatinine, Ser: 0.64 mg/dL (ref 0.44–1.00)
GFR, Estimated: >60 mL/min (ref >60)
Glucose, Bld: 96 mg/dL (ref 70–99)
Potassium: 3.5 mmol/L (ref 3.5–5.1)
Sodium: 137 mmol/L (ref 135–145)

## 2021-01-13 LAB — DIGOXIN LEVEL: Digoxin Level: 0.7 ng/mL — ABNORMAL LOW (ref 0.8–2.0)

## 2021-01-13 LAB — MAGNESIUM: Magnesium: 2.2 mg/dL (ref 1.7–2.4)

## 2021-01-13 MED ORDER — MIDODRINE HCL 5 MG PO TABS
2.5000 mg | ORAL_TABLET | Freq: Two times a day (BID) | ORAL | Status: DC
  Administered 2021-01-13 – 2021-01-14 (×3): 2.5 mg via ORAL
  Filled 2021-01-13 (×3): qty 1

## 2021-01-13 MED ORDER — DIGOXIN 0.0625 MG HALF TABLET
0.0625 mg | ORAL_TABLET | Freq: Every day | ORAL | Status: DC
  Administered 2021-01-14 – 2021-01-28 (×15): 0.0625 mg via ORAL
  Filled 2021-01-13 (×17): qty 1

## 2021-01-13 MED ORDER — LOSARTAN POTASSIUM 25 MG PO TABS: 12.5000 mg | ORAL_TABLET | Freq: Every day | ORAL | Status: DC

## 2021-01-13 NOTE — Progress Notes (Addendum)
Advanced Heart Failure Rounding Note  PCP-Cardiologist: Parke Poisson, MD  HF: Dr. Gala Romney  Subjective:   01/11/21 Moved to CIR   Seen during OT session this am. Reports fatigue after earlier session today. Able to take a few steps with assistance. Some dyspnea with activity.   Dizziness with position changes. No recurrent syncope.    Objective:   Weight Range: 65.8 kg Body mass index is 28.33 kg/m.   Vital Signs:   Temp:  [97.6 F (36.4 C)-98.5 F (36.9 C)] 97.6 F (36.4 C) (10/12 0440) Pulse Rate:  [66-72] 72 (10/12 0926) Resp:  [16-17] 17 (10/12 0440) BP: (115-139)/(63-76) 129/72 (10/12 0926) SpO2:  [94 %-100 %] 95 % (10/12 0440) Last BM Date: 01/12/21  Weight change: Filed Weights   01/11/21 1649 01/11/21 1737  Weight: 65.8 kg 65.8 kg    Intake/Output:   Intake/Output Summary (Last 24 hours) at 01/13/2021 1108 Last data filed at 01/13/2021 0937 Gross per 24 hour  Intake 130 ml  Output --  Net 130 ml      Physical Exam    General: Sitting up in bed. No distress HEENT: normal Neck: No JVD. Carotids 2+ bilat; no bruits. No lymphadenopathy or thryomegaly appreciated. Cor: PMI nondisplaced. Regular rate & rhythm. No rubs, gallops or murmurs. Lungs: clear Abdomen: soft, nontender, nondistended. Extremities: no cyanosis, clubbing, rash, edema Neuro: alert & orientedx3, cranial nerves grossly intact. moves all 4 extremities w/o difficulty. Affect pleasant    Labs    CBC Recent Labs    01/11/21 0438 01/12/21 0400  WBC 5.9 5.6  NEUTROABS  --  3.9  HGB 9.3* 9.0*  HCT 29.4* 29.0*  MCV 94.8 97.0  PLT 395 397   Basic Metabolic Panel Recent Labs    02/40/97 0438 01/12/21 0400 01/13/21 0242  NA 135 136  --   K 4.0 3.6  --   CL 104 104  --   CO2 25 25  --   GLUCOSE 82 77  --   BUN 10 11  --   CREATININE 0.63 0.66  --   CALCIUM 8.2* 8.3*  --   MG 2.1 2.1 2.2   Liver Function Tests Recent Labs    01/11/21 0438 01/12/21 0400   AST 39 32  ALT 61* 51*  ALKPHOS 79 71  BILITOT 1.1 1.2  PROT 5.0* 4.7*  ALBUMIN 2.0* 1.9*   No results for input(s): LIPASE, AMYLASE in the last 72 hours. Cardiac Enzymes No results for input(s): CKTOTAL, CKMB, CKMBINDEX, TROPONINI in the last 72 hours.  BNP: BNP (last 3 results) Recent Labs    12/29/20 0014  BNP >4,500.0*    ProBNP (last 3 results) No results for input(s): PROBNP in the last 8760 hours.   D-Dimer No results for input(s): DDIMER in the last 72 hours. Hemoglobin A1C No results for input(s): HGBA1C in the last 72 hours. Fasting Lipid Panel No results for input(s): CHOL, HDL, LDLCALC, TRIG, CHOLHDL, LDLDIRECT in the last 72 hours. Thyroid Function Tests Recent Labs    01/12/21 0400  TSH 6.838*    Other results:   Imaging    No results found.   Medications:     Scheduled Medications:  aspirin EC  81 mg Oral Daily   atorvastatin  80 mg Oral Daily   Chlorhexidine Gluconate Cloth  6 each Topical Daily   digoxin  0.125 mg Oral Daily   docusate sodium  200 mg Oral Daily   enoxaparin (LOVENOX) injection  40 mg Subcutaneous Q24H   ezetimibe  10 mg Oral Daily   feeding supplement  237 mL Oral BID BM   levothyroxine  100 mcg Oral Q0600   losartan  12.5 mg Oral Daily   magnesium hydroxide  30 mL Oral Daily   senna  2 tablet Oral BID   sodium chloride flush  10-40 mL Intracatheter Q12H   spironolactone  25 mg Oral Daily   thiamine  100 mg Oral Daily   ticagrelor  90 mg Oral BID    Infusions:   PRN Medications: acetaminophen, alum & mag hydroxide-simeth, bisacodyl, diphenhydrAMINE, guaiFENesin-dextromethorphan, lip balm, melatonin, oxyCODONE-acetaminophen, polyethylene glycol, prochlorperazine **OR** prochlorperazine **OR** prochlorperazine, sodium chloride, sodium chloride flush, sodium phosphate    Patient Profile  Admitted 9/27 with NSTEMI complicated by cardiogenic shock.   Assessment/Plan  1. NSTEMI--->CAD severe multivessel  diseease - HS Trop [id number]. ECHO EF 25-30% and severely reduced RV function.  - LHC with severe L main with multivessel  proximal LAD and mild RCA .  - In the cath lab PA sat < 40%., CI 1.6.  Placed Impella 2.5 placed and started on milrinone  - Felt too high risk for CABG.  - 9/30 PCI to RCA with DES x 3, deferred intervention on left main with difficulty wiring the LAD and non-flow limiting dissection.  Can consider repeat PCI attempt (versus off-pump LIMA) down the road as she improves - Continue ASA 81, ticagrelor, atorvastatin. - No chest pain.    2.  Acute systolic HF ->  Cardiogenic shock:  - Ischemic cardiomyopathy.  Echo with EF 25-30%, severely decreased RV function, moderate-severe TR, moderate MR. Required MCS w/ Impella. Impella removed 10/3.  -  Diuretics on hold due to orthostasis. Volume status stable.  - Continue digoxin 0.125 mcg , dig level 0.5   - Continue spiro 25 mg  - Continue losartan 12.5 - Avoiding SGLT2i due to groin infections.  - No beta blocker d/t recent shock - Will hold off on further med titration for now d/t recurrent syncopal episodes and orthostatic hypotension. Somewhat frail.  - Continue ted hose. Will add midodrine 2.5 mg BID (am and early afternoon).   3. Shock Liver  -Resolved     4. Anemia  - Hgb dropped to 6.5 initially>> given 2UPRBCS with appropriate rise to 8.9.  - 1U RBCs given 9/29.   - Significant hematoma left groin post-intervention, had 2 units PRBCs 9/30 with appropriate increase in hgb. IABP removed 10/3. Site remains stable  - Hgb 9   5. Thrombocytopenia due to Impella - resolved   6. H/O AKI:  - resolved - Renal function normalized.   7. Orthostatic hypotension: - Dizzy during rehab today. + orthostatics.  - Encouraged more fluids and TED hose - Abdominal binder - Add midodrine as above - BMET and CBC today   8. Debility - CIR appreciated.    Length of Stay: 2  FINCH, LINDSAY N, PA-C  01/13/2021, 11:08  AM  Advanced Heart Failure Team Pager [phone] (M-F; 7a - 5p)  Please contact [facility] for night-coverage after hours (5p -7a ) and weekends on [email or web address]  Patient seen and examined with the above-signed Advanced Practice Provider and/or Housestaff. I personally reviewed laboratory data, imaging studies and relevant notes. I independently examined the patient and formulated the important aspects of the plan. I have edited the note to reflect any of my changes or salient points. I have personally discussed the plan with the patient and/or family.  Denies CP or SOB. She is dizzy with standing and orthostatic.  General:  Weak appearing. No resp difficulty HEENT: normal Neck: supple. no JVD. Carotids 2+ bilat; no bruits. No lymphadenopathy or thryomegaly appreciated. Cor: PMI nondisplaced. Regular rate & rhythm. No rubs, gallops or murmurs. Lungs: clear Abdomen: soft, nontender, nondistended. No hepatosplenomegaly. No bruits or masses. Good bowel sounds. Extremities: no cyanosis, clubbing, rash, edema Neuro: alert & orientedx3, cranial nerves grossly intact. moves all 4 extremities w/o difficulty. Affect pleasant  Will add midodrine for orthostasis. Continue other meds. No again or HF symptoms.   Arvilla Meres, MD  2:02 PM

## 2021-01-13 NOTE — Progress Notes (Signed)
Occupational Therapy Session Note  Patient Details  Name: Jane Navarro MRN: [id number] Date of Birth: 16-Jul-1945  Today's Date: 01/13/2021 OT Individual Time: 1300-1409 OT Individual Time Calculation (min): 69 min    Short Term Goals: Week 1:  OT Short Term Goal 1 (Week 1): Pt will be able to complete toilet transfers with min A. OT Short Term Goal 2 (Week 1): Pt will be able to complete 3/3 toileting tasks with min A. OT Short Term Goal 3 (Week 1): Pt will don pants with min A by being able to pull pants over hips herself. OT Short Term Goal 4 (Week 1): Pt will demonstrate improved endurance by tolerating standing at sink to brush her teeth.  Skilled Therapeutic Interventions/Progress Updates:  Pt greeted supine in bed agreeable to OT intervention. Session focus on functional bed mobility, BADL reedication and BUE therex to increase strength and activity tolerance for higher level functional mobility. Pt agreeable to transitioning from supine>sit to don pants from EOB. Pt completed bed mobility with supervision however once pt EOB pt immediately reports dizziness returning self to supine; BP 137/75 HR 71 SpO2 97 %. Pt required supine rest break reporting fatigue from previous sessions and events of moving from 4W>5C. Took time to discuss 3hr therapy requirement, with pt agreeable to 3Hr requirement but requesting to have her sessions broke down into shorter sessions and possibly only in the AM; will discuss with pts team therapists. Pt declined attempting to sit EOB anymore during session requesting to don pants from supine. Pt required MAX A to don pants with pt bridging hips to pull pants up to waist line. Pt was agreeable to bed level therex with level 1 theraband as indicated below: X10 chest pulls X10 shoulder flexion  X10 bicep curls  Pt requires at least 3 min rest break after every mobility tasks or exercise. Pt did report feeling like she was having a vagal response during a rest  break; BP taken 139/71 HR 68.   pt left  supine in bed with bed alarm activated and all needs within reach.               Therapy Documentation Precautions:  Precautions Precautions: Fall Required Braces or Orthoses: Other Brace Other Brace: pt wears neoprene type sleeve on B knees due to arthritic pain Restrictions Weight Bearing Restrictions: No RLE Weight Bearing: Weight bearing as tolerated LLE Weight Bearing: Weight bearing as tolerated  Pain:pt reports no pain during session     Therapy/Group: Individual Therapy  Barron Schmid 01/13/2021, 3:20 PM

## 2021-01-13 NOTE — Progress Notes (Signed)
Physical Therapy Session Note  Patient Details  Name: Jane Navarro MRN: [id number] Date of Birth: 02/25/1946  Today's Date: 01/13/2021 PT Individual Time: 0900-1014 PT Individual Time Calculation (min): 74 min   Short Term Goals: Week 1:  PT Short Term Goal 1 (Week 1): Pt will ascend/descend 4 6" steps w/BUE support and min A PT Short Term Goal 2 (Week 1): Pt will ambulate 25' w/LRAD and min A PT Short Term Goal 3 (Week 1): Pt will self-propel 50' in WC w/CGA  Skilled Therapeutic Interventions/Progress Updates:     Pt supine in bed at start of session - agreeable to PT tx and reports no pain. Donned thigh-high TED's with totalA at bed level. Began to don sweat pants in bed however noted pt to have episode of urinary incontinence. Required brief change in bed - able to roll in bed with supervision with use of bed rails - completes several bouts to complete brief change and don pants. RN arriving for morning medications. Assisted to EOB via log rolling technique, requiring minA for trunk elevation. She is able to sit EOB while taking medications, although this took ++ time.   Supine BP: 129/72 HR 72 Seated BP: 120/70 HR 78 *asymptomatic  Assisted to her w/c via stand<>pivot transfer with minA and no AD - cues for general technique and sequencing. She was transported to ortho rehab gym in her w/c. Initiated seated there-ex with 3# ankle weight for LAQ and hip marches, 2x10 reps bilaterally. She also completed repeated sit<>stands to RW, requiring minA for boosting to rise and verbal cues for hand placement to push from her w/c rather than pulling from RW. Needed rest breaks b/w sets for energy conservation. Able to progress gait where she was able to take a few steps, ~3ft, with minA and RW with +2 assist for w/c follow. Gait distance limited by fatigue.   Transported upstairs to her new room in 5C06 - RN and nurse secretary notified. Assisted back to bed via stand<>pivot transfer with minA and  no AD. Required minA for sit>supine for BLE management. Made comfortable with all needs met, call bell in reach and working.   Therapy Documentation Precautions:  Precautions Precautions: Fall Required Braces or Orthoses: Other Brace Other Brace: pt wears neoprene type sleeve on B knees due to arthritic pain Restrictions Weight Bearing Restrictions: No RLE Weight Bearing: Weight bearing as tolerated LLE Weight Bearing: Weight bearing as tolerated General:    Therapy/Group: Individual Therapy   P  01/13/2021, 7:38 AM  

## 2021-01-13 NOTE — IPOC Note (Signed)
Overall Plan of Care [facility]) Patient Details Name: Tyrone Balash MRN: [id number] DOB: [birth year]  Admitting Diagnosis: Debility  Hospital Problems: Principal Problem:   Debility     Functional Problem List: Nursing Bladder, Bowel, Medication Management, Safety, Pain, Endurance, Skin Integrity  PT Endurance  OT Balance, Endurance, Motor  SLP    TR         Basic ADL's: OT Bathing, Dressing, Toileting, Grooming     Advanced  ADL's: OT       Transfers: PT Bed Mobility, Bed to Chair, Car, Occupational psychologist, Research scientist (life sciences): PT Ambulation, Psychologist, prison and probation services, Stairs     Additional Impairments: OT None  SLP        TR      Anticipated Outcomes Item Anticipated Outcome  Self Feeding independent  Swallowing      Basic self-care  supervision  Toileting  supervision   Bathroom Transfers supervision  Bowel/Bladder  manage w mod I assist  Transfers  S*  Locomotion  S*  Communication     Cognition     Pain  at or below level 4  Safety/Judgment  maintain w cues   Therapy Plan: PT Intensity: Minimum of 1-2 x/day ,45 to 90 minutes PT Frequency: Total of 15 hours over 7 days of combined therapies PT Duration Estimated Length of Stay: 10-12 days OT Intensity: Minimum of 1-2 x/day, 45 to 90 minutes OT Frequency: Total of 15 hours over 7 days of combined therapies (due to low endurance, pt can not tolerate a full therapy schedule) OT Duration/Estimated Length of Stay: 10-12 days     Due to the current state of emergency, patients may not be receiving their 3-hours of Medicare-mandated therapy.   Team Interventions: Nursing Interventions Bladder Management, Disease Management/Prevention, Medication Management, Pain Management, Bowel Management, Patient/Family Education, Discharge Planning, Skin Care/Wound Management  PT interventions Ambulation/gait training, Community reintegration, Neuromuscular re-education, Psychosocial support, DME/adaptive  equipment instruction, Stair training, UE/LE Strength taining/ROM, Wheelchair propulsion/positioning, UE/LE Coordination activities, Therapeutic Activities, Skin care/wound management, Pain management, Functional electrical stimulation, Discharge planning, Balance/vestibular training, Cognitive remediation/compensation, Disease management/prevention, Functional mobility training, Patient/family education, Splinting/orthotics, Therapeutic Exercise, Visual/perceptual remediation/compensation  OT Interventions Balance/vestibular training, Discharge planning, DME/adaptive equipment instruction, Functional mobility training, Patient/family education, Psychosocial support, Self Care/advanced ADL retraining, Therapeutic Exercise, UE/LE Strength taining/ROM, Therapeutic Activities  SLP Interventions    TR Interventions    SW/CM Interventions Discharge Planning, Psychosocial Support, Patient/Family Education, Disease Management/Prevention   Barriers to Discharge MD  Medical stability  Nursing Decreased caregiver support, Home environment access/layout, Wound Care 1 level 3 ste (ramp panding) no rails w son  PT Decreased caregiver support, Home environment access/layout Significant cardiovascular deconditioning  OT      SLP      SW Insurance for SNF coverage     Team Discharge Planning: Destination: PT-Home ,OT- Home , SLP-  Projected Follow-up: PT-Home health PT, OT-  Home health OT, SLP-  Projected Equipment Needs: PT-To be determined, OT- 3 in 1 bedside comode, SLP-  Equipment Details: PT- , OT-  Patient/family involved in discharge planning: PT- Patient, Family member/caregiver,  OT-Patient, Family member/caregiver, SLP-   MD ELOS: 10-14d Medical Rehab Prognosis:  Good Assessment: 75 year old female with history of CVA, vertigo, OA bilateral knees, recent admission for encephalopathy due to hypothyroidism who was admitted on 12/28/20 with reports of progressive decline since d/c from [facility] three days PTA. Family reported decrease in intake as well as some chest pain  PTA. She was found to be hypotensive with new LBBB, elevated troponins due to NSTEMI, UTI and AKI. She was treated with IV antibiotics and 2D echo showed new reduction in EF to 25-30% with moderate to severe TVR as well as small hypermobile filament in LV.Marland Kitchen  She was found to have cardiogenic shock and underwent urgent catherizaton by Dr. 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 She was required milirione and impella placed  by Dr.Bemsimhon for management of shock.  She did have  issues with drop in H/H requiring 3 total units of PRBC.     Medical treatment recommended as patient felt to be poor surgical candidate and she underwent PCI with RCA with three overlapping stents  by Dr. Eden Emms on 09/30 with plans for LAD intervention in the future.  Post procedure with hypotension and bleeding with hematoma left groin as well as drop in platelets to 54. 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   Impella removed on 10/04 but has continued to have issue with hypotension requiring NE resumption and milirionone weaned off.  Medications being adjusted due to ongoing issues with issues with orthostasis and intermittent syncope --last on 10/09.   Therapy ongoing and she continues to be limited by weakness with shuffling gait, balance deficits with posterior lean, fear as well as fatigue affecting mobility and ADLs. She was able to sit up in a chair for 2 hours yesterday but limited by syncopal episode    See Team Conference Notes for weekly updates to the plan of care

## 2021-01-13 NOTE — Progress Notes (Signed)
Occupational Therapy Session Note  Patient Details  Name: Jane Navarro MRN: [id number] Date of Birth: 02-01-1946  Today's Date: 01/13/2021 OT Individual Time: 1300-1409 OT Individual Time Calculation (min): 69 min    Short Term Goals: Week 1:  OT Short Term Goal 1 (Week 1): Pt will be able to complete toilet transfers with min A. OT Short Term Goal 2 (Week 1): Pt will be able to complete 3/3 toileting tasks with min A. OT Short Term Goal 3 (Week 1): Pt will don pants with min A by being able to pull pants over hips herself. OT Short Term Goal 4 (Week 1): Pt will demonstrate improved endurance by tolerating standing at sink to brush her teeth.  Skilled Therapeutic Interventions/Progress Updates:   Pt received in bed with no c/o pain but reported she is tired from PT. Agreeable to OT session focusing on activity tolerance, grooming EOB for tolerance for upright positioning and bed mobility as a prerequisite to functional transfers OOB. Additional time required during tx for skilled monitoring of vitals (listed below). CHF PA stopped in during tx. Per PA, try ace wrap on LE for OH. Water intake was encouraged during OT session.   Supine: 139/73 EOB: 123/74 (90) Standing in Stedy: 93/75 EOB: 131/74 (91) Supine: 148/81 (102)  Therapeutic Activity: Skilled interventions include: Pt completes supine > EOB with CGA. Requires increased time d/t fatigue. Pt demonstrates good carryover of diaphragmatic breathing techniques learned in previous therapy sessions. Received additional education to avoid elevating shoulders and OT provided directional cuing to facilitate proper technique. Declined getting OOB to wc for lunch - pt reported feeling weak. Pt educated on benefits of working on activity tolerance and proper hydration throughout the day. Pt verbalized understanding. Pt tolerated sitting EOB to wash face with setup. Charlaine Dalton was used to check orthostatic vitals. Pt able to stand in stedy for ~90  seconds with intermittent cuing for postural control before reporting that she felt "dizzy". Pt required rest break EOB before requesting to lay back down. Performed lateral scoot in prep for functional transfers with CGA. Returned to supine with CGA. Pt left at end of session in bed, eating lunch, with exit alarm on, call light in reach and all needs met.  Therapy Documentation Precautions:  Precautions Precautions: Fall Required Braces or Orthoses: Other Brace Other Brace: pt wears neoprene type sleeve on B knees due to arthritic pain Restrictions Weight Bearing Restrictions: No RLE Weight Bearing: Weight bearing as tolerated LLE Weight Bearing: Weight bearing as tolerated   Therapy/Group: Individual Therapy  Keyshon Stein 01/13/2021, 12:15 PM

## 2021-01-13 NOTE — Progress Notes (Signed)
Orthopedic Tech Progress Note Patient Details:  Jane Navarro 05-07-1945 [id number]  Secretary called requesting an ABDOMINAL BINDER   Ortho Devices Type of Ortho Device: Abdominal binder Ortho Device/Splint Location: STOMACH Ortho Device/Splint Interventions: Ordered   Post Interventions Patient Tolerated: Well Instructions Provided: Care of device  [name] 01/13/2021, 2:19 PM

## 2021-01-13 NOTE — Progress Notes (Signed)
PROGRESS NOTE   Subjective/Complaints: Appreciate cardiology note , feels tired, "working on " constipation  Social:  Daughter lives near [place], one son in [place] and one in [place].  Plans are to go to [facility]  ROS- neg CP, SOB, N/V/D  Objective:   No results found. Recent Labs    01/11/21 0438 01/12/21 0400  WBC 5.9 5.6  HGB 9.3* 9.0*  HCT 29.4* 29.0*  PLT 395 397    Recent Labs    01/11/21 0438 01/12/21 0400  NA 135 136  K 4.0 3.6  CL 104 104  CO2 25 25  GLUCOSE 82 77  BUN 10 11  CREATININE 0.63 0.66  CALCIUM 8.2* 8.3*     Intake/Output Summary (Last 24 hours) at 01/13/2021 8101 Last data filed at 01/13/2021 0700 Gross per 24 hour  Intake 120 ml  Output --  Net 120 ml         Physical Exam: Vital Signs Blood pressure 129/69, pulse 70, temperature 97.6 F (36.4 C), resp. rate 17, height 5' (1.524 m), weight 65.8 kg, SpO2 95 %.   General: No acute distress Mood and affect are appropriate Heart: Regular rate and rhythm no rubs murmurs or extra sounds Lungs: Clear to auscultation, breathing unlabored, no rales or wheezes Abdomen: Positive bowel sounds, soft nontender to palpation, nondistended Extremities: No clubbing, cyanosis, or edema Skin: No evidence of breakdown, no evidence of rash Neurologic: Cranial nerves II through XII intact, motor strength is 4/5 in bilateral deltoid, bicep, tricep, grip, hip flexor, knee extensors, ankle dorsiflexor and plantar flexor  Musculoskeletal: Full range of motion in all 4 extremities. No joint swelling   Assessment/Plan: 1. Functional deficits which require 3+ hours per day of interdisciplinary therapy in a comprehensive inpatient rehab setting. Physiatrist is providing close team supervision and 24 hour management of active medical problems listed below. Physiatrist and rehab team continue to assess barriers to discharge/monitor  patient progress toward functional and medical goals  Care Tool:  Bathing    Body parts bathed by patient: Right arm, Left arm, Chest, Abdomen, Right upper leg, Left upper leg, Face         Bathing assist Assist Level: Supervision/Verbal cueing     Upper Body Dressing/Undressing Upper body dressing   What is the patient wearing?: Bra, Button up shirt    Upper body assist Assist Level: Minimal Assistance - Patient > 75%    Lower Body Dressing/Undressing Lower body dressing      What is the patient wearing?: Pants     Lower body assist Assist for lower body dressing: Maximal Assistance - Patient 25 - 49%     Toileting Toileting    Toileting assist Assist for toileting: Moderate Assistance - Patient 50 - 74%     Transfers Chair/bed transfer  Transfers assist     Chair/bed transfer assist level: Dependent - Patient 0%     Locomotion Ambulation   Ambulation assist   Ambulation activity did not occur: Safety/medical concerns (Fatigue, syncope)          Walk 10 feet activity   Assist  Walk 10 feet activity did not occur: Safety/medical concerns (Fatigue, syncope)  Walk 50 feet activity   Assist Walk 50 feet with 2 turns activity did not occur: Safety/medical concerns (Fatigue, syncope)         Walk 150 feet activity   Assist Walk 150 feet activity did not occur: Safety/medical concerns (Fatigue, syncope)         Walk 10 feet on uneven surface  activity   Assist Walk 10 feet on uneven surfaces activity did not occur: Safety/medical concerns (Fatigue, syncope)         Wheelchair     Assist Is the patient using a wheelchair?: Yes Type of Wheelchair: Manual Wheelchair activity did not occur: Safety/medical concerns (Fatigue, syncope)         Wheelchair 50 feet with 2 turns activity    Assist    Wheelchair 50 feet with 2 turns activity did not occur: Safety/medical concerns (Fatigue, syncope)        Wheelchair 150 feet activity     Assist  Wheelchair 150 feet activity did not occur: Safety/medical concerns (Fatigue, syncope)       Blood pressure 129/69, pulse 70, temperature 97.6 F (36.4 C), resp. rate 17, height 5' (1.524 m), weight 65.8 kg, SpO2 95 %.  Medical Problem List and Plan: 1.  Cardiopulmonary debility s/p NSTEMI, CHF             -patient may shower but incision must be covered             -ELOS/Goals: ELOS 10/22 modI/S 2.  Impaired mobility: -DVT/anticoagulation:  Pharmaceutical: Lovenox             -antiplatelet therapy: Continue ASA/Brillinta 3. Postoperative pain: Continue Oxycodone prn  4. Mood: LCSW to follow for evaluation and support.              -antipsychotic agents: N/a 5. Neuropsych: This patient is capable of making decisions on her own behalf. 6. Skin/Wound Care: Routine pressure relief measures.  7. Fluids/Electrolytes/Nutrition: Monitor I/O. BUN , lytes nl 10/11 8. NSTEMI/CAD s/p PCI/stent: Monitor for symptoms with increase in activity.        9. Acute systolic HF: Heart healthy diet. Check daily weights--admit wt 141-->150 lbs today. --Monitor for symptoms of overload.  --On spironolactone, Cozaar, Digoxin, ASA/Brillinta and Lipitor  10. Hypothyroid: On supplement for past month.  TSH ordered per cards             --Improving from 41.4-->4.8 in three weeks.  11. AKI: Has resolved--recheck BMET wnl 12. Shocked liver: Abnormal LFTs are resolving--recheck LFTs in am.  13. Acute blood loss anemia: Groin hematomas appear resolved.  --H/H stable around 9.3. Recheck CBC in am.  14. Low albumin levels: Continue Ensure BID--family encouraged to bring low salt foods from home. 15. Bilateral Knee pain: Voltaren gel added to help manage symptoms.  16. Constipation: Will increase Senna to bid (does not like daily miralax-->d/c and continue MOM daily  BM x 2 on 10/11 17. Orthostasis w/syncope: ortho vitals normal 10/10             --change to THT and  order abdominal binder.              --encourage oral intake.  18. Overweight BMI 28.33: provide dietary education    LOS: 2 days A FACE TO FACE EVALUATION WAS PERFORMED  Erick Colace 01/13/2021, 9:07 AM

## 2021-01-14 ENCOUNTER — Ambulatory Visit: Payer: Medicare HMO

## 2021-01-14 LAB — BASIC METABOLIC PANEL
Anion gap: 7 (ref 5–15)
BUN: 13 mg/dL (ref 8–23)
CO2: 24 mmol/L (ref 22–32)
Calcium: 8.1 mg/dL — ABNORMAL LOW (ref 8.9–10.3)
Chloride: 106 mmol/L (ref 98–111)
Creatinine, Ser: 0.72 mg/dL (ref 0.44–1.00)
GFR, Estimated: >60 mL/min (ref >60)
Glucose, Bld: 80 mg/dL (ref 70–99)
Potassium: 3.5 mmol/L (ref 3.5–5.1)
Sodium: 137 mmol/L (ref 135–145)

## 2021-01-14 LAB — CBC
HCT: 28.2 % — ABNORMAL LOW (ref 36.0–46.0)
Hemoglobin: 8.7 g/dL — ABNORMAL LOW (ref 12.0–15.0)
MCH: 29.6 pg (ref 26.0–34.0)
MCHC: 30.9 g/dL (ref 30.0–36.0)
MCV: 95.9 fL (ref 80.0–100.0)
Platelets: 392 10*3/uL (ref 150–400)
RBC: 2.94 MIL/uL — ABNORMAL LOW (ref 3.87–5.11)
RDW: 15.4 % (ref 11.5–15.5)
WBC: 5.6 10*3/uL (ref 4.0–10.5)
nRBC: 0 % (ref 0.0–0.2)

## 2021-01-14 LAB — MAGNESIUM: Magnesium: 2.3 mg/dL (ref 1.7–2.4)

## 2021-01-14 MED ORDER — POTASSIUM CHLORIDE CRYS ER 20 MEQ PO TBCR
40.0000 meq | EXTENDED_RELEASE_TABLET | Freq: Once | ORAL | Status: AC
  Administered 2021-01-14: 40 meq via ORAL
  Filled 2021-01-14: qty 2

## 2021-01-14 MED ORDER — SPIRONOLACTONE 12.5 MG HALF TABLET
12.5000 mg | ORAL_TABLET | Freq: Every day | ORAL | Status: DC
  Administered 2021-01-14: 12.5 mg via ORAL
  Filled 2021-01-14 (×3): qty 1

## 2021-01-14 MED ORDER — MIDODRINE HCL 5 MG PO TABS
5.0000 mg | ORAL_TABLET | Freq: Two times a day (BID) | ORAL | Status: DC
  Administered 2021-01-15 – 2021-01-19 (×9): 5 mg via ORAL
  Filled 2021-01-14 (×9): qty 1

## 2021-01-14 NOTE — Progress Notes (Signed)
Physical Therapy Session Note  Patient Details  Name: Jane Navarro MRN: [id number] Date of Birth: Jul 19, 1945  Today's Date: 01/14/2021 PT Individual Time: 0800-0855; 1406-1500 PT Individual Time Calculation (min): 55 min and 54 min  Short Term Goals: Week 1:  PT Short Term Goal 1 (Week 1): Pt will ascend/descend 4 6" steps w/BUE support and min A PT Short Term Goal 2 (Week 1): Pt will ambulate 25' w/LRAD and min A PT Short Term Goal 3 (Week 1): Pt will self-propel 50' in [facility] w/CGA  Skilled Therapeutic Interventions/Progress Updates:  Session 1 Pt received supine in bed, denied pain but reported fatigue.Emphasis of session on improved cardiovascular endurance and activity tolerance. BP at 123/66 mmHg in supine. Donned knee-high ted hose w/total A, pt refused thigh-highs. Pt rolled to R side w/CGA for lower body dressing w/total A and reported symptoms of orthostasis, encouraged pt to perform diaphragmatic breathing, BP at 136/72 mmHg. After several minutes, pt rolled to L side w/CGA and reported feeling lightheaded again, pt' appearance notably pallor, BP at 125/87 mmHg. After several minutes, supine <>sit EOB w/CGA, max cues for encouragement as pt requested therapist do it for her. BP sitting EOB at 113/72 mmHg, pt asymptomatic. Pt sat EOB w/BUE support for 4 minutes w/S* and requested to lay down due to fatigue, sit <>supine w/CGA. After several minutes, pt performed supine <>sit w/CGA and became symptomatic for orthostasis, BP at 107/73 mmHg. Sit <>supine w/CGA and pt was left supine in bed, legs elevated, all needs in reach.   Session 2 Pt received seated in recliner, legs elevated w/ted hose donned and denied pain. Emphasis of session on improved activity tolerance and confidence w/gait. BP seated at 132/78 mmHg. Sit <>stand from recliner to RW w/CGA and pt ambulated 5' to [facility] w/min A for hip guidance during turn to sit. Pt very fearful of falling and requires lots of encouragement to  participate. BP after walk at 144/79 mmHg. Pt transported w/total A to 5N hallway for gait training.   Gait training -Sit <>stands throughout session from [facility] w/CGA, verbal cues for safe hand placement  -Pt ambulated 3', 10', 10' and 7' w/RW and CGA, close WC follow for safety. Noted kyphotic posture, decreased stance time on RLE, decreased step length of LLE>RLE, and narrow BOS. Verbal cues for diaphragmatic breathing, maintaining safe distance to RW and encouragement of performance. Pt very anxious in beginning due to high fear of falling but progressed well w/words of encouragement. Pt fatigued very quickly but SpO2 maintained above 93% and no signs/symptoms of orthostasis throughout session.   Pt transported back to room w/total A due to fatigue. Sit <>stand pivot from WC to EOB w/CGA and RW. Pt performed supine <>sit w/S* and was left supine in bed, all needs in reach.   Therapy Documentation Precautions:  Precautions Precautions: Fall Required Braces or Orthoses: Other Brace Other Brace: pt wears neoprene type sleeve on B knees due to arthritic pain Restrictions Weight Bearing Restrictions: No RLE Weight Bearing: Weight bearing as tolerated LLE Weight Bearing: Weight bearing as tolerated   Therapy/Group: Individual Therapy Jane Navarro Jane Navarro, PT, DPT  01/14/2021, 7:44 AM

## 2021-01-14 NOTE — Progress Notes (Signed)
Occupational Therapy Session Note  Patient Details  Name: Jane Navarro MRN: [id number] Date of Birth: 1946-02-05  Today's Date: 01/14/2021 OT Individual Time: 1100-1200 OT Individual Time Calculation (min): 60 min    Short Term Goals: Week 1:  OT Short Term Goal 1 (Week 1): Pt will be able to complete toilet transfers with min A. OT Short Term Goal 2 (Week 1): Pt will be able to complete 3/3 toileting tasks with min A. OT Short Term Goal 3 (Week 1): Pt will don pants with min A by being able to pull pants over hips herself. OT Short Term Goal 4 (Week 1): Pt will demonstrate improved endurance by tolerating standing at sink to brush her teeth.  Skilled Therapeutic Interventions/Progress Updates:    Pt received in bed. Discussed her new 15/7 schedule and pt felt much better about having a slower paced schedule.   Initiated therapy with bed level exercises to warm up her low back and legs. Pt then needed to go to the bathroom and requested bed pan. Encouraged her to try to toilet.  No BSC available in her room so had to go get one from another room and placed over toilet.  Pt sat to EOB with CGA, stood with RW and pivoted stand pivot to wc. Wc brought into  Bathroom and pt completed a stand pivot using grab bars to toilet.  Pt stood with CGA and therapist adjusted her pants down.   Pt was able to void bowel and bladder.  Max A with cleansing. She stood again for therapist to assist with pants.  She needs frequent rest breaks and does get somewhat light headed after standing. But with relaxed breathing she is able to abate her symptoms.  Provided pt with RW and asked her to walk 3 feet to her wc. Her took her 12 small steps but she was able to.  Needed a long rest break.  Tried on her corset but pt could not tolerate it stating it made her feel like she could not breathe.  Pt stated she felt extremely uncomfortable even with some adjustments to corset.  Pt is likely feeling lightheaded due to so  much time spent in bed.   Encouraged pt to use RW to move to recliner. Pt able to stand and turn around with RW with CGA to move to recliner.  Pt out of breath but moved well. Relaxed with breathing and cues to inhale through her nose.  Pt happy she was able to do transfers well.   Adjusted in recliner with feet elevated.  Demonstrated how to adjust back of recliner.  Belt alarm on and all needs met.   Therapy Documentation Precautions:  Precautions Precautions: Fall Required Braces or Orthoses: Other Brace Other Brace: pt wears neoprene type sleeve on B knees due to arthritic pain Restrictions Weight Bearing Restrictions: No RLE Weight Bearing: Weight bearing as tolerated LLE Weight Bearing: Weight bearing as tolerated    Vital Signs: Therapy Vitals Temp: 99.6 F (37.6 C) Temp Source: Oral Pulse Rate: 70 Resp: 18 BP: 135/67 Patient Position (if appropriate): Lying Oxygen Therapy SpO2: 93 % O2 Device: Room Air Pain:   ADL: ADL Eating: Set up Grooming: Supervision/safety Upper Body Bathing: Supervision/safety Where Assessed-Upper Body Bathing: Edge of bed Lower Body Bathing: Moderate assistance Where Assessed-Lower Body Bathing: Edge of bed Upper Body Dressing: Minimal assistance Where Assessed-Upper Body Dressing: Edge of bed Lower Body Dressing: Maximal assistance Where Assessed-Lower Body Dressing: Edge of bed Toileting: Maximal assistance Where  Assessed-Toileting: Glass blower/designer: Moderate assistance Toilet Transfer Method: Stand pivot Toilet Transfer Equipment: Grab bars   Therapy/Group: Individual Therapy  [place] 01/14/2021, 8:28 AM

## 2021-01-14 NOTE — Progress Notes (Signed)
Occupational Therapy Note  Patient Details  Name: Jane Navarro MRN: [id number] Date of Birth: 01-Nov-1945    Pt's therapy team is recommending 15/7 therapy schedule as she is not able to tolerate 3 hours of therapy. Pt has severely low activity tolerance. Will upgrade patient to regular schedule as soon as pt is ready.   Trinh Sanjose 01/14/2021, 8:26 AM

## 2021-01-14 NOTE — Progress Notes (Addendum)
Advanced Heart Failure Rounding Note  PCP-Cardiologist: Parke Poisson, MD  HF: Dr. Gala Romney  Subjective:   01/11/21 Moved to CIR  10/12: dizzy and orthostatic. Midodrine added. Abdominal binder added.   Still orthostatic today. See below. Felt dizzy w/ standing this am.  Wearing TED hoses but abdominal binder not on.  Wt down 9 lb. Not eating much. SCr stable.   Denies dyspnea w/ PT. No CP.   Orthostatic VS for the past 24 hrs:  BP- Lying Pulse- Lying BP- Sitting Pulse- Sitting BP- Standing at 0 minutes  01/14/21 0522 135/67 70 117/73 74 --  01/13/21 1256 139/73 -- 123/74 -- 93/75     Objective:   Weight Range: 61.9 kg Body mass index is 26.65 kg/m.   Vital Signs:   Temp:  [97.6 F (36.4 C)-99.6 F (37.6 C)] 99.6 F (37.6 C) (10/13 0522) Pulse Rate:  [64-72] 70 (10/13 0522) Resp:  [18] 18 (10/13 0522) BP: (122-135)/(65-72) 135/67 (10/13 0522) SpO2:  [93 %-97 %] 93 % (10/13 0522) Weight:  [61.9 kg] 61.9 kg (10/13 0500) Last BM Date: 01/13/21  Weight change: Filed Weights   01/11/21 1649 01/11/21 1737 01/14/21 0500  Weight: 65.8 kg 65.8 kg 61.9 kg    Intake/Output:   Intake/Output Summary (Last 24 hours) at 01/14/2021 0915 Last data filed at 01/14/2021 0730 Gross per 24 hour  Intake 338 ml  Output --  Net 338 ml      Physical Exam    General:  Elderly WM laying in bed. Well appearing. No respiratory difficulty HEENT: dentition in poor repair, otherwise normal Neck: supple. no JVD. Carotids 2+ bilat; no bruits. No lymphadenopathy or thyromegaly appreciated. Cor: PMI nondisplaced. Regular rate & rhythm. No rubs, gallops or murmurs. Lungs: clear Abdomen: soft, nontender, nondistended. No hepatosplenomegaly. No bruits or masses. Good bowel sounds. Extremities: no cyanosis, clubbing, rash, edema + TED hoses  Neuro: alert & oriented x 3, cranial nerves grossly intact. moves all 4 extremities w/o difficulty. Affect pleasant.  Labs    CBC Recent  Labs    01/12/21 0400 01/13/21 1224 01/14/21 0320  WBC 5.6 6.2 5.6  NEUTROABS 3.9  --   --   HGB 9.0* 9.4* 8.7*  HCT 29.0* 29.4* 28.2*  MCV 97.0 94.8 95.9  PLT 397 385 392   Basic Metabolic Panel Recent Labs    19/62/22 0242 01/13/21 1224 01/14/21 0320  NA  --  137 137  K  --  3.5 3.5  CL  --  104 106  CO2  --  25 24  GLUCOSE  --  96 80  BUN  --  11 13  CREATININE  --  0.64 0.72  CALCIUM  --  8.4* 8.1*  MG 2.2  --  2.3   Liver Function Tests Recent Labs    01/12/21 0400  AST 32  ALT 51*  ALKPHOS 71  BILITOT 1.2  PROT 4.7*  ALBUMIN 1.9*   No results for input(s): LIPASE, AMYLASE in the last 72 hours. Cardiac Enzymes No results for input(s): CKTOTAL, CKMB, CKMBINDEX, TROPONINI in the last 72 hours.  BNP: BNP (last 3 results) Recent Labs    12/29/20 0014  BNP >4,500.0*    ProBNP (last 3 results) No results for input(s): PROBNP in the last 8760 hours.   D-Dimer No results for input(s): DDIMER in the last 72 hours. Hemoglobin A1C No results for input(s): HGBA1C in the last 72 hours. Fasting Lipid Panel No results for input(s): CHOL,  HDL, LDLCALC, TRIG, CHOLHDL, LDLDIRECT in the last 72 hours. Thyroid Function Tests Recent Labs    01/12/21 0400  TSH 6.838*    Other results:   Imaging    No results found.   Medications:     Scheduled Medications:  aspirin EC  81 mg Oral Daily   atorvastatin  80 mg Oral Daily   Chlorhexidine Gluconate Cloth  6 each Topical Daily   digoxin  0.0625 mg Oral Daily   docusate sodium  200 mg Oral Daily   enoxaparin (LOVENOX) injection  40 mg Subcutaneous Q24H   ezetimibe  10 mg Oral Daily   feeding supplement  237 mL Oral BID BM   levothyroxine  100 mcg Oral Q0600   losartan  12.5 mg Oral QHS   magnesium hydroxide  30 mL Oral Daily   midodrine  2.5 mg Oral BID WC   senna  2 tablet Oral BID   sodium chloride flush  10-40 mL Intracatheter Q12H   spironolactone  25 mg Oral Daily   thiamine  100 mg Oral  Daily   ticagrelor  90 mg Oral BID    Infusions:   PRN Medications: acetaminophen, alum & mag hydroxide-simeth, bisacodyl, diphenhydrAMINE, guaiFENesin-dextromethorphan, lip balm, melatonin, oxyCODONE-acetaminophen, polyethylene glycol, prochlorperazine **OR** prochlorperazine **OR** prochlorperazine, sodium chloride, sodium chloride flush, sodium phosphate    Patient Profile  Admitted 9/27 with NSTEMI complicated by cardiogenic shock.   Assessment/Plan  1. 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 ECHO EF 25-30% and severely reduced RV function.  - LHC with severe L main with multivessel  proximal LAD and mild RCA .  - In the cath lab PA sat < 40%., CI 1.6.  Placed Impella 2.5 placed and started on milrinone  - Felt too high risk for CABG.  - 9/30 PCI to RCA with DES x 3, deferred intervention on left main with difficulty wiring the LAD and non-flow limiting dissection.  Can consider repeat PCI attempt (versus off-pump LIMA) down the road as she improves - Continue ASA 81, ticagrelor, atorvastatin. - No chest pain.    2.  Acute systolic HF ->  Cardiogenic shock:  - Ischemic cardiomyopathy.  Echo with EF 25-30%, severely decreased RV function, moderate-severe TR, moderate MR. Required MCS w/ Impella. Impella removed 10/3.  - Loop diuretics on hold due to orthostasis. Wt down 9 lb - Continue digoxin 0.125 mcg , dig level 0.5   - Reduce spiro to 12.5 mg  - Continue losartan 12.5 mg  - Avoiding SGLT2i due to groin infections.  - No beta blocker d/t recent shock - Will hold off on further med titration for now d/t recurrent syncopal episodes and orthostatic hypotension. Somewhat frail.  - Continue ted hose + midodrine 2.5 mg BID    3. Shock Liver  -Resolved     4. Anemia  - Hgb dropped to 6.5 initially>> given 2UPRBCS with appropriate rise to 8.9.  - 1U RBCs given 9/29.   - Significant hematoma left groin post-intervention, had 2 units PRBCs 9/30 with  appropriate increase in hgb. IABP removed 10/3. Site remains stable  - Hgb 8.7 today    5. Thrombocytopenia due to Impella - resolved   6. H/O AKI:  - resolved - Renal function normalized.   7. Orthostatic hypotension: - PO intake is poor, wt down 9 lb.   - Continue midodrine 2.5 mg bid - Reduce spiro to 12.5 mg daily  - Encouraged more fluids, TED hose + abdominal binder  8. Debility - CIR appreciated.    Length of Stay: [address] PA-C  01/14/2021, 9:15 AM  Advanced Heart Failure Team Pager [phone] (M-F; 7a - 5p)  Please contact [facility] for night-coverage after hours (5p -7a ) and weekends on [email or web address]  Patient seen and examined with the above-signed Advanced Practice Provider and/or Housestaff. I personally reviewed laboratory data, imaging studies and relevant notes. I independently examined the patient and formulated the important aspects of the plan. I have edited the note to reflect any of my changes or salient points. I have personally discussed the plan with the patient and/or family.   Remains weak. Still dizzy and orthostatic. No CP, orthopnea or PND.   General:  Weak appearing. No resp difficulty HEENT: normal Neck: supple. no JVD. Carotids 2+ bilat; no bruits. No lymphadenopathy or thryomegaly appreciated. Cor: PMI nondisplaced. Regular rate & rhythm. No rubs, gallops or murmurs. Lungs: clear Abdomen: soft, nontender, nondistended. No hepatosplenomegaly. No bruits or masses. Good bowel sounds. Extremities: no cyanosis, clubbing, rash, edema Neuro: alert & orientedx3, cranial nerves grossly intact. moves all 4 extremities w/o difficulty. Affect pleasant  Continues to struggle with dizziness and orthostasis. Agree with cutting spiro back. Stop losartan. Continue TED hose and ab binder. Increase midodrine to 5 bid.   Arvilla Meres, MD  5:39 PM

## 2021-01-14 NOTE — Progress Notes (Addendum)
PROGRESS NOTE   Subjective/Complaints: Per  Dr B , no specific need for PICC, Hgb is drifting down  Social:  Daughter lives near [place], one son in [place] and one in [place].  Plans are to go to [facility]  ROS- neg CP, SOB, N/V/D  Objective:   No results found. Recent Labs    01/13/21 1224 01/14/21 0320  WBC 6.2 5.6  HGB 9.4* 8.7*  HCT 29.4* 28.2*  PLT 385 392    Recent Labs    01/13/21 1224 01/14/21 0320  NA 137 137  K 3.5 3.5  CL 104 106  CO2 25 24  GLUCOSE 96 80  BUN 11 13  CREATININE 0.64 0.72  CALCIUM 8.4* 8.1*     Intake/Output Summary (Last 24 hours) at 01/14/2021 0809 Last data filed at 01/14/2021 0730 Gross per 24 hour  Intake 338 ml  Output --  Net 338 ml         Physical Exam: Vital Signs Blood pressure 135/67, pulse 70, temperature 99.6 F (37.6 C), temperature source Oral, resp. rate 18, height 5' (1.524 m), weight 61.9 kg, SpO2 93 %.  General: No acute distress Mood and affect are appropriate Heart: Regular rate and rhythm no rubs murmurs or extra sounds Lungs: Clear to auscultation, breathing unlabored, no rales or wheezes Abdomen: Positive bowel sounds, soft nontender to palpation, nondistended Extremities: No clubbing, cyanosis, or edema Skin: No evidence of breakdown, no evidence of rash Neurologic: Cranial nerves II through XII intact, motor strength is 4/5 in bilateral deltoid, bicep, tricep, grip, hip flexor, knee extensors, ankle dorsiflexor and plantar flexor  Musculoskeletal: Full range of motion in all 4 extremities. No joint swelling   Assessment/Plan: 1. Functional deficits which require 3+ hours per day of interdisciplinary therapy in a comprehensive inpatient rehab setting. Physiatrist is providing close team supervision and 24 hour management of active medical problems listed below. Physiatrist and rehab team continue to assess barriers to  discharge/monitor patient progress toward functional and medical goals  Care Tool:  Bathing    Body parts bathed by patient: Right arm, Left arm, Chest, Abdomen, Right upper leg, Left upper leg, Face         Bathing assist Assist Level: Supervision/Verbal cueing     Upper Body Dressing/Undressing Upper body dressing   What is the patient wearing?: Bra, Button up shirt    Upper body assist Assist Level: Minimal Assistance - Patient > 75%    Lower Body Dressing/Undressing Lower body dressing      What is the patient wearing?: Pants     Lower body assist Assist for lower body dressing: Maximal Assistance - Patient 25 - 49%     Toileting Toileting    Toileting assist Assist for toileting: Moderate Assistance - Patient 50 - 74%     Transfers Chair/bed transfer  Transfers assist     Chair/bed transfer assist level: Dependent - Patient 0%     Locomotion Ambulation   Ambulation assist   Ambulation activity did not occur: Safety/medical concerns (Fatigue, syncope)          Walk 10 feet activity   Assist  Walk 10 feet activity did  not occur: Safety/medical concerns (Fatigue, syncope)        Walk 50 feet activity   Assist Walk 50 feet with 2 turns activity did not occur: Safety/medical concerns (Fatigue, syncope)         Walk 150 feet activity   Assist Walk 150 feet activity did not occur: Safety/medical concerns (Fatigue, syncope)         Walk 10 feet on uneven surface  activity   Assist Walk 10 feet on uneven surfaces activity did not occur: Safety/medical concerns (Fatigue, syncope)         Wheelchair     Assist Is the patient using a wheelchair?: Yes Type of Wheelchair: Manual Wheelchair activity did not occur: Safety/medical concerns (Fatigue, syncope)         Wheelchair 50 feet with 2 turns activity    Assist    Wheelchair 50 feet with 2 turns activity did not occur: Safety/medical concerns (Fatigue,  syncope)       Wheelchair 150 feet activity     Assist  Wheelchair 150 feet activity did not occur: Safety/medical concerns (Fatigue, syncope)       Blood pressure 135/67, pulse 70, temperature 99.6 F (37.6 C), temperature source Oral, resp. rate 18, height 5' (1.524 m), weight 61.9 kg, SpO2 93 %.  Medical Problem List and Plan: 1.  Cardiopulmonary debility s/p NSTEMI, CHF             -patient may shower but incision must be covered             -ELOS/Goals: ELOS 10/22 modI/S- change to 15/7 poor endurance 2.  Impaired mobility: -DVT/anticoagulation:  Pharmaceutical: Lovenox             -antiplatelet therapy: Continue ASA/Brillinta 3. Postoperative pain: Continue Oxycodone prn  4. Mood: LCSW to follow for evaluation and support.              -antipsychotic agents: N/a 5. Neuropsych: This patient is capable of making decisions on her own behalf. 6. Skin/Wound Care: Routine pressure relief measures.  7. Fluids/Electrolytes/Nutrition: Monitor I/O. No longer need daily lytes 8. NSTEMI/CAD s/p PCI/stent: Monitor for symptoms with increase in activity.        9. Acute systolic HF: Heart healthy diet. Check daily weights--admit wt 141-->150 lbs today. --Monitor for symptoms of overload.  --On spironolactone, Cozaar, Digoxin, ASA/Brillinta and Lipitor  10. Hypothyroid: On supplement for past month.  TSH ordered per cards             --Improving from 41.4-->4.8 in three weeks.  11. AKI: Has resolved--recheck BMET wnl 12. Shocked liver: Abnormal LFTs are resolving--recheck LFTs in am.  13. Acute blood loss anemia: Groin hematomas appear resolved.  --H/H drifting down, cont daily CBC at this point we can keep PICC 14. Low albumin levels: Continue Ensure BID--family encouraged to bring low salt foods from home. 15. Bilateral Knee pain: Voltaren gel added to help manage symptoms.  16. Constipation: Will increase Senna to bid (does not like daily miralax-->d/c and continue MOM daily  BM  x 2 on 10/11 17. Orthostasis w/syncope: ortho vitals normal 10/10             --change to THT and order abdominal binder.              --encourage oral intake.  18. Overweight BMI 28.33: provide dietary education    LOS: 3 days A FACE TO FACE EVALUATION WAS PERFORMED  Erick Colace 01/14/2021, 8:09 AM

## 2021-01-14 NOTE — Progress Notes (Signed)
Patient ID: Jane Navarro, female   DOB: 01-Nov-1945, 75 y.o.   MRN: [id number] Spoke with daughter who expressed concerns regarding pt's ability to participate in therapies if she is having blood pressure issues and not feeling well. Asking if discharge will still be 10/22. Discussed will continue to discuss and meet again on Tuesday. Will need to discuss if it is realistic with pt going home versus going to SNF, but can not use her managed medicare to go to one from rehab. Will await medical stability and see how she progresses in therapies.

## 2021-01-15 DIAGNOSIS — R7401 Elevation of levels of liver transaminase levels: Secondary | ICD-10-CM

## 2021-01-15 DIAGNOSIS — G8918 Other acute postprocedural pain: Secondary | ICD-10-CM

## 2021-01-15 DIAGNOSIS — I951 Orthostatic hypotension: Secondary | ICD-10-CM

## 2021-01-15 DIAGNOSIS — D62 Acute posthemorrhagic anemia: Secondary | ICD-10-CM

## 2021-01-15 DIAGNOSIS — I5021 Acute systolic (congestive) heart failure: Secondary | ICD-10-CM

## 2021-01-15 LAB — CBC
HCT: 29.2 % — ABNORMAL LOW (ref 36.0–46.0)
Hemoglobin: 9 g/dL — ABNORMAL LOW (ref 12.0–15.0)
MCH: 29.7 pg (ref 26.0–34.0)
MCHC: 30.8 g/dL (ref 30.0–36.0)
MCV: 96.4 fL (ref 80.0–100.0)
Platelets: 365 10*3/uL (ref 150–400)
RBC: 3.03 MIL/uL — ABNORMAL LOW (ref 3.87–5.11)
RDW: 15.7 % — ABNORMAL HIGH (ref 11.5–15.5)
WBC: 5 10*3/uL (ref 4.0–10.5)
nRBC: 0 % (ref 0.0–0.2)

## 2021-01-15 LAB — BASIC METABOLIC PANEL
Anion gap: 5 (ref 5–15)
BUN: 10 mg/dL (ref 8–23)
CO2: 26 mmol/L (ref 22–32)
Calcium: 8.4 mg/dL — ABNORMAL LOW (ref 8.9–10.3)
Chloride: 106 mmol/L (ref 98–111)
Creatinine, Ser: 0.59 mg/dL (ref 0.44–1.00)
GFR, Estimated: >60 mL/min (ref >60)
Glucose, Bld: 87 mg/dL (ref 70–99)
Potassium: 3.8 mmol/L (ref 3.5–5.1)
Sodium: 137 mmol/L (ref 135–145)

## 2021-01-15 MED ORDER — POTASSIUM CHLORIDE CRYS ER 20 MEQ PO TBCR
20.0000 meq | EXTENDED_RELEASE_TABLET | Freq: Once | ORAL | Status: AC
  Administered 2021-01-15: 20 meq via ORAL
  Filled 2021-01-15: qty 1

## 2021-01-15 MED ORDER — SODIUM CHLORIDE 0.9 % IV BOLUS
500.0000 mL | Freq: Once | INTRAVENOUS | Status: AC
  Administered 2021-01-15: 500 mL via INTRAVENOUS

## 2021-01-15 NOTE — Progress Notes (Signed)
Unable to get standing BP due Pt wasn't able to stand long enough to register BP. Pt start feeling dizzy and lightheaded. Pt was put back to bed safely.

## 2021-01-15 NOTE — Progress Notes (Addendum)
Advanced Heart Failure Rounding Note  PCP-Cardiologist: Parke Poisson, MD  HF: Dr. Gala Romney  Subjective:   01/11/21 Moved to CIR  10/12: dizzy and orthostatic. Midodrine added. Abdominal binder added.    Cut back spiro and stopped losartan yesterday d/t ongoing orthostatic dizziness. Midodrine increased to 5 BID  Unable to stand long enough to complete orthostatics this am d/t dizziness.   Not wearing abdominal binder, too large. Obtaining smaller size.  Orthostatic VS for the past 24 hrs:  BP- Lying Pulse- Lying BP- Sitting Pulse- Sitting  01/15/21 0422 124/75 64 127/75 65     Objective:   Weight Range: 61.1 kg Body mass index is 26.31 kg/m.   Vital Signs:   Temp:  [98.1 F (36.7 C)-98.6 F (37 C)] 98.6 F (37 C) (10/14 0422) Pulse Rate:  [61-64] 64 (10/14 0422) Resp:  [14-17] 17 (10/14 0422) BP: (124-136)/(75-77) 124/75 (10/14 0422) SpO2:  [94 %-97 %] 95 % (10/14 0422) Weight:  [61.1 kg] 61.1 kg (10/14 0419) Last BM Date: 01/14/21  Weight change: Filed Weights   01/11/21 1737 01/14/21 0500 01/15/21 0419  Weight: 65.8 kg 61.9 kg 61.1 kg    Intake/Output:   Intake/Output Summary (Last 24 hours) at 01/15/2021 0958 Last data filed at 01/15/2021 0700 Gross per 24 hour  Intake 630 ml  Output --  Net 630 ml      Physical Exam    General:  Elderly female in no acute distress. HEENT: normal Neck: supple. no JVD. Carotids 2+ bilat; no bruits. No lymphadenopathy or thryomegaly appreciated. Cor: PMI nondisplaced. Regular rate & rhythm. No rubs, gallops or murmurs. Lungs: clear Abdomen: soft, nontender, nondistended.  Extremities: no cyanosis, clubbing, rash, edema, TED hose on Neuro: alert & orientedx3, cranial nerves grossly intact. moves all 4 extremities w/o difficulty. Affect pleasant   Labs    CBC Recent Labs    01/14/21 0320 01/15/21 0407  WBC 5.6 5.0  HGB 8.7* 9.0*  HCT 28.2* 29.2*  MCV 95.9 96.4  PLT 392 365   Basic Metabolic  Panel Recent Labs    01/13/21 0242 01/13/21 1224 01/14/21 0320 01/15/21 0854  NA  --    < > 137 137  K  --    < > 3.5 3.8  CL  --    < > 106 106  CO2  --    < > 24 26  GLUCOSE  --    < > 80 87  BUN  --    < > 13 10  CREATININE  --    < > 0.72 0.59  CALCIUM  --    < > 8.1* 8.4*  MG 2.2  --  2.3  --    < > = values in this interval not displayed.   Liver Function Tests No results for input(s): AST, ALT, ALKPHOS, BILITOT, PROT, ALBUMIN in the last 72 hours.  No results for input(s): LIPASE, AMYLASE in the last 72 hours. Cardiac Enzymes No results for input(s): CKTOTAL, CKMB, CKMBINDEX, TROPONINI in the last 72 hours.  BNP: BNP (last 3 results) Recent Labs    12/29/20 0014  BNP >4,500.0*    ProBNP (last 3 results) No results for input(s): PROBNP in the last 8760 hours.   D-Dimer No results for input(s): DDIMER in the last 72 hours. Hemoglobin A1C No results for input(s): HGBA1C in the last 72 hours. Fasting Lipid Panel No results for input(s): CHOL, HDL, LDLCALC, TRIG, CHOLHDL, LDLDIRECT in the last 72 hours.  Thyroid Function Tests No results for input(s): TSH, T4TOTAL, T3FREE, THYROIDAB in the last 72 hours.  Invalid input(s): FREET3   Other results:   Imaging    No results found.   Medications:     Scheduled Medications:  aspirin EC  81 mg Oral Daily   atorvastatin  80 mg Oral Daily   Chlorhexidine Gluconate Cloth  6 each Topical Daily   digoxin  0.0625 mg Oral Daily   docusate sodium  200 mg Oral Daily   enoxaparin (LOVENOX) injection  40 mg Subcutaneous Q24H   ezetimibe  10 mg Oral Daily   feeding supplement  237 mL Oral BID BM   levothyroxine  100 mcg Oral Q0600   magnesium hydroxide  30 mL Oral Daily   midodrine  5 mg Oral BID WC   senna  2 tablet Oral BID   sodium chloride flush  10-40 mL Intracatheter Q12H   spironolactone  12.5 mg Oral Daily   thiamine  100 mg Oral Daily   ticagrelor  90 mg Oral BID    Infusions:   PRN  Medications: acetaminophen, alum & mag hydroxide-simeth, bisacodyl, diphenhydrAMINE, guaiFENesin-dextromethorphan, lip balm, melatonin, oxyCODONE-acetaminophen, polyethylene glycol, prochlorperazine **OR** prochlorperazine **OR** prochlorperazine, sodium chloride, sodium chloride flush, sodium phosphate    Patient Profile  Admitted 9/27 with NSTEMI complicated by cardiogenic shock.   Assessment/Plan  1. 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 ECHO EF 25-30% and severely reduced RV function.  - LHC with severe L main with multivessel  proximal LAD and mild RCA .  - In the cath lab PA sat < 40%., CI 1.6.  Placed Impella 2.5 placed and started on milrinone  - Felt too high risk for CABG.  - 9/30 PCI to RCA with DES x 3, deferred intervention on left main with difficulty wiring the LAD and non-flow limiting dissection.  Can consider repeat PCI attempt (versus off-pump LIMA) down the road as she improves - Continue ASA 81, ticagrelor, atorvastatin. - No chest pain.    2.  Acute systolic HF ->  Cardiogenic shock:  - Ischemic cardiomyopathy.  Echo with EF 25-30%, severely decreased RV function, moderate-severe TR, moderate MR. Required MCS w/ Impella. Impella removed 10/3.  - Loop diuretics on hold due to orthostasis and low volume. Wt down 11 lb - Continue digoxin 0.125 mcg , dig level 0.5   - Avoiding SGLT2i due to groin infections.  - No beta blocker d/t recent shock - Stopped losartan, D/C spiro today d/t ongoing orthostatic dizziness. See below.    3. Shock Liver  -Resolved     4. Anemia  - Hgb dropped to 6.5 initially>> given 2UPRBCS with appropriate rise to 8.9.  - 1U RBCs given 9/29.   - Significant hematoma left groin post-intervention, had 2 units PRBCs 9/30 with appropriate increase in hgb. IABP removed 10/3. Site remains stable  - Hgb 9.0 today   5. Thrombocytopenia due to Impella - resolved   6. H/O AKI:  - resolved - Renal function normalized.    7. Orthostatic hypotension: - PO intake is poor, wt down 11 lb.   - Continue midodrine 5 mg bid - Stop spiro. Continue to hold losartan - Encouraged more fluids, TED hose + abdominal binder.  - RN reattempting orthostatics today  8. Debility - CIR appreciated.    Length of Stay: 4  [facility], LINDSAY N, PA-C  01/15/2021, 9:58 AM  Advanced Heart Failure Team Pager [phone] (M-F; 7a - 5p)  Please contact  [facility] for night-coverage after hours (5p -7a ) and weekends on [email or web address]   Patient seen and examined with the above-signed Advanced Practice Provider and/or Housestaff. I personally reviewed laboratory data, imaging studies and relevant notes. I independently examined the patient and formulated the important aspects of the plan. I have edited the note to reflect any of my changes or salient points. I have personally discussed the plan with the patient and/or family.  Remains quite dizzy on standing. Unable to complete orthostatics. No CP, orthopnea or PND   General:  Weak appearing. No resp difficulty HEENT: normal Neck: supple. no JVD. Carotids 2+ bilat; no bruits. No lymphadenopathy or thryomegaly appreciated. Cor: PMI nondisplaced. Regular rate & rhythm. No rubs, gallops or murmurs. Lungs: clear Abdomen: soft, nontender, nondistended. No hepatosplenomegaly. No bruits or masses. Good bowel sounds. Extremities: no cyanosis, clubbing, rash, edema Neuro: alert & orientedx3, cranial nerves grossly intact. moves all 4 extremities w/o difficulty. Affect pleasant  Agree with stopping spiro. Continue midodrine. Will give 500 cc NS bolus. If dizziness persists after correction of orthostasis will need Neuro eval.   Arvilla Meres, MD  5:08 PM

## 2021-01-15 NOTE — Progress Notes (Signed)
Physical Therapy Session Note  Patient Details  Name: Jane Navarro MRN: [id number] Date of Birth: [birth year]  Today's Date: 01/15/2021 PT Individual Time: 0915-0956 PT Individual Time Calculation (min): 41 min   Short Term Goals: Week 1:  PT Short Term Goal 1 (Week 1): Pt will ascend/descend 4 6" steps w/BUE support and min A PT Short Term Goal 2 (Week 1): Pt will ambulate 25' w/LRAD and min A PT Short Term Goal 3 (Week 1): Pt will self-propel 50' in [facility] w/CGA  Skilled Therapeutic Interventions/Progress Updates:  Pt received supine in bed, denied pain and was agreeable to PT. Emphasis of session on improved activity tolerance. Donned ted hose and socks w/total A. Pt rolled to L & R w/S* to perform lower body dressing w/max A. Supine <>sit EOB w/S*.   Therex for improved single leg stability, cardiovascular endurance and standing tolerance. BP taken throughout (see below):  -Sit <>stands x3 from EOB w/CGA, verbal cues for hand placement   -Standing alt. Marches w/RW and CGA, 3x15s.   BP readings aken before and after each set of marches: -1st set: started at 131/90 mmHG, dropped to 105/74 mmHg - symptomatic.  -2nd set: started at 148/75 mmHg, dropped to 136/77 mmHg - asymptomatic  -3rd set: started at 127/71 mmHg, dropped to 110/70 mmHg - asymptomatic   Pt performed sit <>supine w/S* and was left supine in bed, all needs in reach.   Therapy Documentation Precautions:  Precautions Precautions: Fall Required Braces or Orthoses: Other Brace Other Brace: pt wears neoprene type sleeve on B knees due to arthritic pain Restrictions Weight Bearing Restrictions: No RLE Weight Bearing: Weight bearing as tolerated LLE Weight Bearing: Weight bearing as tolerated    Therapy/Group: Individual Therapy [name] Zamyah Wiesman, PT, DPT  01/15/2021, 7:50 AM

## 2021-01-15 NOTE — Progress Notes (Signed)
Occupational Therapy Session Note  Patient Details  Name: Jane Navarro MRN: [id number] Date of Birth: Apr 08, 1945  Today's Date: 01/15/2021 OT Individual Time: 6948-5462 OT Individual Time Calculation (min): 53 min    Short Term Goals: Week 1:  OT Short Term Goal 1 (Week 1): Pt will be able to complete toilet transfers with min A. OT Short Term Goal 2 (Week 1): Pt will be able to complete 3/3 toileting tasks with min A. OT Short Term Goal 3 (Week 1): Pt will don pants with min A by being able to pull pants over hips herself. OT Short Term Goal 4 (Week 1): Pt will demonstrate improved endurance by tolerating standing at sink to brush her teeth.  Skilled Therapeutic Interventions/Progress Updates:  Pt greeted seated in recliner agreeable to OT intervention with TEDs and ABD binder donned. Pt completed ~ 5 ft of functional mobility from recliner>w/c with RW and MIN A. Pt required ~ 3 min seated rest break in between transitional movements. Pt transported to therapy gym with total A where pt session focus on various therapeutic activities focused on standing dynamic balance as precursor to higher level ADL tasks. Pt able to sit<>stand from w/c with rw and MIN A to completed dynamic reaching task using life size connect 4 to reach out of BOS to retrieve game pieces and return to midline to drop pieces in correct slots. Pt abel to stand for 1 min to complete task before needing seated rest break. ~ 6 mins needed to recover. Pt request to see how long she can stand, CGA for sit<>stand from w/c with Rw, pt abel to stand ~ 31 secs before needing to sit and reporting dizziness, BP 147/58 from sitting in chair with TEDs and ABD donned. Pt returned to room with total A where pt completed stand pivot transfer from w/c>EOB with Rw and CGA. Pt able to return to supine with CGA. Pt left supine in bed with bed alarm activated and all needs within reach.   Therapy Documentation Precautions:   Precautions Precautions: Fall Required Braces or Orthoses: Other Brace Other Brace: pt wears neoprene type sleeve on B knees due to arthritic pain Restrictions Weight Bearing Restrictions: No RLE Weight Bearing: Weight bearing as tolerated LLE Weight Bearing: Weight bearing as tolerated  Pain: pt reports no pain during session    Therapy/Group: Individual Therapy  Barron Schmid 01/15/2021, 1:58 PM

## 2021-01-15 NOTE — Progress Notes (Signed)
PROGRESS NOTE   Subjective/Complaints: Patient seen sitting up in bed this morning.  He states he slept well overnight.  She notes issues with orthostasis.  She was seen by heart failure team yesterday, appreciate recs, notes reviewed-medications reduced.  ROS: Denies CP, SOB, N/V/D  Objective:   No results found. Recent Labs    01/14/21 0320 01/15/21 0407  WBC 5.6 5.0  HGB 8.7* 9.0*  HCT 28.2* 29.2*  PLT 392 365    Recent Labs    01/14/21 0320 01/15/21 0854  NA 137 137  K 3.5 3.8  CL 106 106  CO2 24 26  GLUCOSE 80 87  BUN 13 10  CREATININE 0.72 0.59  CALCIUM 8.1* 8.4*     Intake/Output Summary (Last 24 hours) at 01/15/2021 1034 Last data filed at 01/15/2021 0700 Gross per 24 hour  Intake 630 ml  Output --  Net 630 ml         Physical Exam: Vital Signs Blood pressure 124/75, pulse 64, temperature 98.6 F (37 C), temperature source Oral, resp. rate 17, height 5' (1.524 m), weight 61.1 kg, SpO2 95 %. Constitutional: No distress . Vital signs reviewed. HENT: Normocephalic.  Atraumatic. Eyes: EOMI. No discharge. Cardiovascular: No JVD.  RRR. Respiratory: Normal effort.  No stridor.  Bilateral clear to auscultation. GI: Non-distended.  BS +. Skin: Warm and dry.  Intact. Psych: Normal mood.  Normal behavior. Musc: No edema in extremities.  No tenderness in extremities. Neuro: Alert and oriented Motor: 4/5 throughout  Assessment/Plan: 1. Functional deficits which require 3+ hours per day of interdisciplinary therapy in a comprehensive inpatient rehab setting. Physiatrist is providing close team supervision and 24 hour management of active medical problems listed below. Physiatrist and rehab team continue to assess barriers to discharge/monitor patient progress toward functional and medical goals  Care Tool:  Bathing    Body parts bathed by patient: Right arm, Left arm, Chest, Abdomen, Right upper  leg, Left upper leg, Face         Bathing assist Assist Level: Supervision/Verbal cueing     Upper Body Dressing/Undressing Upper body dressing   What is the patient wearing?: Bra, Button up shirt    Upper body assist Assist Level: Minimal Assistance - Patient > 75%    Lower Body Dressing/Undressing Lower body dressing      What is the patient wearing?: Pants     Lower body assist Assist for lower body dressing: Maximal Assistance - Patient 25 - 49%     Toileting Toileting    Toileting assist Assist for toileting: Maximal Assistance - Patient 25 - 49%     Transfers Chair/bed transfer  Transfers assist     Chair/bed transfer assist level: Contact Guard/Touching assist     Locomotion Ambulation   Ambulation assist   Ambulation activity did not occur: Safety/medical concerns (Fatigue, syncope)  Assist level: Contact Guard/Touching assist Assistive device: Walker-rolling Max distance: 10'   Walk 10 feet activity   Assist  Walk 10 feet activity did not occur: Safety/medical concerns (Fatigue, syncope)  Assist level: Contact Guard/Touching assist Assistive device: Walker-rolling   Walk 50 feet activity   Assist Walk 50 feet with 2  turns activity did not occur: Safety/medical concerns (Fatigue, syncope)         Walk 150 feet activity   Assist Walk 150 feet activity did not occur: Safety/medical concerns (Fatigue, syncope)         Walk 10 feet on uneven surface  activity   Assist Walk 10 feet on uneven surfaces activity did not occur: Safety/medical concerns (Fatigue, syncope)         Wheelchair     Assist Is the patient using a wheelchair?: Yes Type of Wheelchair: Manual Wheelchair activity did not occur: Safety/medical concerns (Fatigue, syncope)         Wheelchair 50 feet with 2 turns activity    Assist    Wheelchair 50 feet with 2 turns activity did not occur: Safety/medical concerns (Fatigue, syncope)        Wheelchair 150 feet activity     Assist  Wheelchair 150 feet activity did not occur: Safety/medical concerns (Fatigue, syncope)       Blood pressure 124/75, pulse 64, temperature 98.6 F (37 C), temperature source Oral, resp. rate 17, height 5' (1.524 m), weight 61.1 kg, SpO2 95 %.  Medical Problem List and Plan: 1.  Cardiopulmonary debility s/p NSTEMI, CHF Continue CIR  2.  Impaired mobility: -DVT/anticoagulation:  Pharmaceutical: Lovenox             -antiplatelet therapy: Continue ASA/Brillinta 3. Postoperative pain: Continue Oxycodone prn   Monitor increased exertion 4. Mood: LCSW to follow for evaluation and support.              -antipsychotic agents: N/a 5. Neuropsych: This patient is capable of making decisions on her own behalf. 6. Skin/Wound Care: Routine pressure relief measures.  7. Fluids/Electrolytes/Nutrition: Monitor I/Os  BMP within acceptable range on 10/14 8. NSTEMI/CAD s/p PCI/stent: Monitor for symptoms with increase in activity.        9. Acute systolic HF: Heart healthy diet. Check daily weights--admit wt 141 lbs. --Monitor for symptoms of overload.  --On spironolactone, Cozaar, Digoxin, ASA/Brillinta and Lipitor  Filed Weights   01/11/21 1737 01/14/21 0500 01/15/21 0419  Weight: 65.8 kg 61.9 kg 61.1 kg  Stable on 10/14 10. Hypothyroid: On supplement for past month.  TSH ordered per cards             --Improving from 41.4-->4.8 in three weeks.  11. AKI: Resolved 12. Shocked liver:   ALT elevated, but improving on 10/11  13. Acute blood loss anemia: Groin hematomas appear resolved.  Hemoglobin 9.0 on 10/14 14. Low albumin levels: Continue Ensure BID--family encouraged to bring low salt foods from home. 15. Bilateral Knee pain: Voltaren gel added to help manage symptoms.   Controlled with meds on 10/14 16. Constipation: Increased senna to bid (does not like daily miralax-->d/c and continue MOM  17. Orthostasis w/syncope:              --change to  THT and order abdominal binder.              --encourage oral intake.   Orthostatics negative on 10/13, however patient with symptoms consistent with orthostasis  LOS: 4 days A FACE TO FACE EVALUATION WAS PERFORMED  Jane Navarro Karis Juba 01/15/2021, 10:34 AM

## 2021-01-15 NOTE — Progress Notes (Signed)
Occupational Therapy Session Note  Patient Details  Name: Jane Navarro MRN: [id number] Date of Birth: June 18, 1945  Today's Date: 01/15/2021 OT Individual Time: 1027-2536 OT Individual Time Calculation (min): 45 min    Short Term Goals: Week 1:  OT Short Term Goal 1 (Week 1): Pt will be able to complete toilet transfers with min A. OT Short Term Goal 2 (Week 1): Pt will be able to complete 3/3 toileting tasks with min A. OT Short Term Goal 3 (Week 1): Pt will don pants with min A by being able to pull pants over hips herself. OT Short Term Goal 4 (Week 1): Pt will demonstrate improved endurance by tolerating standing at sink to brush her teeth.  Skilled Therapeutic Interventions/Progress Updates:    Pt received in bed and agreeable to therapy. Had pt work on bed level exercises of knee extensions, bent knee sways, and bridges.  Applied her abdominal binder (the smaller one fits her much better).   Had pt work on supine to sit focusing on visual fixation with turning her head first and then rotating her body to sidelying then sitting. Pt able to sit up with min -mod A.  Pt stated she has a long history of vertigo so has used visual fixation strategies in the past.  Pt sat to EOB and worked on BUE AROM exercises, only completed a few exercises at a time before needing a rest but overall she was able to tolerate sitting unsupported for at least 15- 20 min.   She was then able to stand up to RW with cues to push up with B arms, stood up with only S and then stepped over to recliner with CGA.  Sat in recliner and adjusted with pillows. Belt alarm on and all needs met.   Therapy Documentation Precautions:  Precautions Precautions: Fall Required Braces or Orthoses: Other Brace Other Brace: pt wears neoprene type sleeve on B knees due to arthritic pain Restrictions Weight Bearing Restrictions: No RLE Weight Bearing: Weight bearing as tolerated LLE Weight Bearing: Weight bearing as tolerated    Pain: Pain Assessment Pain Scale: 0-10 Pain Score: 0-No pain ADL: ADL Eating: Set up Grooming: Supervision/safety Upper Body Bathing: Supervision/safety Where Assessed-Upper Body Bathing: Edge of bed Lower Body Bathing: Moderate assistance Where Assessed-Lower Body Bathing: Edge of bed Upper Body Dressing: Minimal assistance Where Assessed-Upper Body Dressing: Edge of bed Lower Body Dressing: Maximal assistance Where Assessed-Lower Body Dressing: Edge of bed Toileting: Maximal assistance Where Assessed-Toileting: Glass blower/designer: Moderate assistance Toilet Transfer Method: Stand pivot Toilet Transfer Equipment: Grab bars   Therapy/Group: Individual Therapy  [place] 01/15/2021, 8:48 AM

## 2021-01-15 NOTE — Progress Notes (Signed)
Routine central line care: RUE PICC site unremarkable. Pt no longer receiving IV medications. Please consider PICC removal.

## 2021-01-15 NOTE — Progress Notes (Signed)
Physical Therapy Session Note  Patient Details  Name: Jane Navarro MRN: [id number] Date of Birth: 24-May-1945  Today's Date: 01/15/2021 PT Individual Time: 6384-5364 PT Individual Time Calculation (min): 23 min   Short Term Goals: Week 1:  PT Short Term Goal 1 (Week 1): Pt will ascend/descend 4 6" steps w/BUE support and min A PT Short Term Goal 2 (Week 1): Pt will ambulate 25' w/LRAD and min A PT Short Term Goal 3 (Week 1): Pt will self-propel 50' in [facility] w/CGA  Skilled Therapeutic Interventions/Progress Updates:     Pt seen for unscheduled therapy time - agreeable to PT tx but requests bed level only due to fatigue. She denies pain. Treatment completed to tolerance. Completed the following bed level there-ex: -2x10 front shldr raises with 3# dowel rod -2x10 chest press with 3# dowel rod -2x10 heel slides, bilaterally  **Verbal cues throughout and tactile cues as needed for sequencing, technique, and muscle activation. Pt required rest breaks b/w sets due to fatigue.   Pt ended session semi reclined in bed with bed alarm on, bed lowered, all needs met.   Therapy Documentation Precautions:  Precautions Precautions: Fall Required Braces or Orthoses: Other Brace Other Brace: pt wears neoprene type sleeve on B knees due to arthritic pain Restrictions Weight Bearing Restrictions: No RLE Weight Bearing: Weight bearing as tolerated LLE Weight Bearing: Weight bearing as tolerated General:    Therapy/Group: Individual Therapy  Tranika Scholler P Kaira Stringfield 01/15/2021, 3:13 PM

## 2021-01-16 LAB — CBC
HCT: 29.7 % — ABNORMAL LOW (ref 36.0–46.0)
Hemoglobin: 9.3 g/dL — ABNORMAL LOW (ref 12.0–15.0)
MCH: 29.9 pg (ref 26.0–34.0)
MCHC: 31.3 g/dL (ref 30.0–36.0)
MCV: 95.5 fL (ref 80.0–100.0)
Platelets: 326 10*3/uL (ref 150–400)
RBC: 3.11 MIL/uL — ABNORMAL LOW (ref 3.87–5.11)
RDW: 15.9 % — ABNORMAL HIGH (ref 11.5–15.5)
WBC: 5.3 10*3/uL (ref 4.0–10.5)
nRBC: 0 % (ref 0.0–0.2)

## 2021-01-16 LAB — BASIC METABOLIC PANEL
Anion gap: 5 (ref 5–15)
BUN: 11 mg/dL (ref 8–23)
CO2: 23 mmol/L (ref 22–32)
Calcium: 8.4 mg/dL — ABNORMAL LOW (ref 8.9–10.3)
Chloride: 108 mmol/L (ref 98–111)
Creatinine, Ser: 0.68 mg/dL (ref 0.44–1.00)
GFR, Estimated: >60 mL/min (ref >60)
Glucose, Bld: 81 mg/dL (ref 70–99)
Potassium: 4 mmol/L (ref 3.5–5.1)
Sodium: 136 mmol/L (ref 135–145)

## 2021-01-16 NOTE — Progress Notes (Signed)
Physical Therapy Session Note  Patient Details  Name: Jane Navarro MRN: [id number] Date of Birth: [birth year]  Today's Date: 01/16/2021 PT Individual Time: 1105-1200 and 1438 - 1512 PT Individual Time Calculation (min): 55 min and 34 min  Short Term Goals: Week 1:  PT Short Term Goal 1 (Week 1): Pt will ascend/descend 4 6" steps w/BUE support and min A PT Short Term Goal 2 (Week 1): Pt will ambulate 25' w/LRAD and min A PT Short Term Goal 3 (Week 1): Pt will self-propel 50' in [facility] w/CGA  Skilled Therapeutic Interventions/Progress Updates:   Session 1: Pt received seated in recliner and agreeable to PT with no c/o pain, but c/o of dizzziness with movement. Session focused on standing tolerance with caution for OH, functional transfers, and WC mobility. At start of session, pt transferred into chair with MinA and no AD, but had high c/o dizziness. Pt mitigated dizziness with rest.  Pt then practiced standing tolerance in room. From WC, pt used bed rail as HHA, and completed repetitions of sit-to-stand and holding upright standing. BP was measured immediately in standing, and again when immediately after sitting. Pt completed 1x10 reps of seated horizontal abduction w/ green band for active rest between rounds. Round 1: Sitting: 140/69 (71) mm/Hg), Standing: 116/72 (83) mm/Hg - Tolerated 56 sec standing Round 2: Sitting: 132/71 (89) mm/Hg, Standing: 119/71 (86) mm/Hg - Tolerated 63 sec standing Round 3: Sitting: 126/74 (90) mm/Hg, Standing: 111/64 (79) mm/Hg - Tolerated 70 sec standing  Pt then practice WC propulsion to promote independence and activity tolerance, completed 2x58f with turn and rest break between. Pt then competed 139fambulation to chair with CGA + RW. Pt ended session with SSTT to bed from chair with noted SBA + RW and SBA for bed mobility.  At end of session pt was left in bed with family present, alarm engaged, call bell, and all needs in reach.  Session 2: Pt received  semi-reclined in bed and agreeable to PT. Pt reported need for toileting, so session focused on transfer out of bed to toilet with maximum independence and close attention to rest breaks for safety in the setting of OH. Pt transferred to EOB with supervision. Pt then took long rest to manage dizziness. Once dizziness diminished, pt then set up pre-opened walker and [facility] under supervision + RW. Pt again required long rest break of dizziness. Pt completed self-propulsion to toilet (15 ft) and then transferred to toilet with supervision + B use of handrail. Of note, pt consistently required long ret break between transfers. After toileting, pt stood under supervision with L handrail, but required full assist for wiping. Pt then completed [facility] to [facility] to beside, and transfer to bed with SSTT with RW all under supervision. Pt continued to need long rest break between each activity to manage dizziness, but was able to maintain a supervision +LRAD level for full activity. At end of session, pt was left seated in bed with alarm engaged, call bell and all needs in reach.    Therapy Documentation Precautions:  Precautions Precautions: Fall Required Braces or Orthoses: Other Brace Other Brace: pt wears neoprene type sleeve on B knees due to arthritic pain Restrictions Weight Bearing Restrictions: No RLE Weight Bearing: Weight bearing as tolerated LLE Weight Bearing: Weight bearing as tolerated  Pain: Session 1: Pain Assessment Pain Scale: 0-10 Pain Score: 0-No pain  Session 2: Pain Assessment Pain Scale: 0-10 Pain Score: 0-No pain  Therapy/Group: Individual Therapy  Marquette Saa, PT, DPT 01/16/2021, 12:55 PM

## 2021-01-16 NOTE — Progress Notes (Signed)
Occupational Therapy Session Note  Patient Details  Name: Jane Navarro MRN: [id number] Date of Birth: 05-Jun-1945  Today's Date: 01/16/2021 OT Individual Time: 1025-8527 OT Individual Time Calculation (min): 55 min    Short Term Goals: Week 1:  OT Short Term Goal 1 (Week 1): Pt will be able to complete toilet transfers with min A. OT Short Term Goal 2 (Week 1): Pt will be able to complete 3/3 toileting tasks with min A. OT Short Term Goal 3 (Week 1): Pt will don pants with min A by being able to pull pants over hips herself. OT Short Term Goal 4 (Week 1): Pt will demonstrate improved endurance by tolerating standing at sink to brush her teeth.  Skilled Therapeutic Interventions/Progress Updates:    Pt received in bed and agreeable to therapy.  Sat to EOB with S and then did a stand pivot to wc with CGA and then to toilet with CGA.  A to doff brief. Pt sat on toilet and voided bowel and bladder.  For cleansing, pt did a semi stand by pushing up on arm rests for therapist to assist cleansing her bottom. Pt sat back on toilet then doffed her dress, washed UB, LB except for her bottom, then donned her new dress.  Therapist assisted with donning TEDS and briefs and starting non slip socks halfway over feet.  Pt then finished pulling them on. During this time, pt would actively participate for 1-2 minutes, rest for 3-4 until she did not feel light headed or dizzy and then continue.  Pt needed a minimal amount of A but numerous short rest breaks.   I told pt to let me know when she was ready to proceed with a task. She was agreeable to trying to ambulate from toilet to recliner (12 feet). RN present and could help bring wc over for support if needed.  Pt able to stand up from toilet with S and then used RW with CGA to ambulate 12 ft to recliner using relaxed breathing methods.  Pt sat in recliner.  Asked pt how she felt, if she was dizzy or light headed, she responded "Actually I feel ok!" Pt very happy  she was able to do this.   Resting in recliner with call light in reach and belt alarm on.  Therapy Documentation Precautions:  Precautions Precautions: Fall Required Braces or Orthoses: Other Brace Other Brace: pt wears neoprene type sleeve on B knees due to arthritic pain Restrictions Weight Bearing Restrictions: No RLE Weight Bearing: Weight bearing as tolerated LLE Weight Bearing: Weight bearing as tolerated   Pain: no c/o pain   ADL: ADL Eating: Set up Grooming: Supervision/safety Upper Body Bathing: Supervision/safety Where Assessed-Upper Body Bathing: Other (Comment) (toilet) Lower Body Bathing: Minimal assistance Where Assessed-Lower Body Bathing:  (toilet) Upper Body Dressing: Supervision/safety Where Assessed-Upper Body Dressing:  (toilet) Lower Body Dressing: Maximal assistance Where Assessed-Lower Body Dressing:  (toilet) Toileting: Maximal assistance Where Assessed-Toileting: Teacher, adult education: Furniture conservator/restorer Method: Surveyor, minerals: Grab bars      Therapy/Group: Individual Therapy  Gor Vestal 01/16/2021, 9:48 AM

## 2021-01-17 LAB — BASIC METABOLIC PANEL
Anion gap: 5 (ref 5–15)
BUN: 10 mg/dL (ref 8–23)
CO2: 25 mmol/L (ref 22–32)
Calcium: 8.3 mg/dL — ABNORMAL LOW (ref 8.9–10.3)
Chloride: 108 mmol/L (ref 98–111)
Creatinine, Ser: 0.66 mg/dL (ref 0.44–1.00)
GFR, Estimated: >60 mL/min (ref >60)
Glucose, Bld: 78 mg/dL (ref 70–99)
Potassium: 3.7 mmol/L (ref 3.5–5.1)
Sodium: 138 mmol/L (ref 135–145)

## 2021-01-17 LAB — CBC
HCT: 30.6 % — ABNORMAL LOW (ref 36.0–46.0)
Hemoglobin: 9.6 g/dL — ABNORMAL LOW (ref 12.0–15.0)
MCH: 30.1 pg (ref 26.0–34.0)
MCHC: 31.4 g/dL (ref 30.0–36.0)
MCV: 95.9 fL (ref 80.0–100.0)
Platelets: 308 10*3/uL (ref 150–400)
RBC: 3.19 MIL/uL — ABNORMAL LOW (ref 3.87–5.11)
RDW: 16.1 % — ABNORMAL HIGH (ref 11.5–15.5)
WBC: 5.2 10*3/uL (ref 4.0–10.5)
nRBC: 0 % (ref 0.0–0.2)

## 2021-01-17 NOTE — Progress Notes (Signed)
Occupational Therapy Session Note  Patient Details  Name: Sparkles Mcneely MRN: [id number] Date of Birth: 1946-01-28  Today's Date: 01/17/2021 OT Individual Time: 3299-2426 OT Individual Time Calculation (min): 45 min  2nd session 13:00-13: 45 (45 min)   Short Term Goals: Week 1:  OT Short Term Goal 1 (Week 1): Pt will be able to complete toilet transfers with min A. OT Short Term Goal 2 (Week 1): Pt will be able to complete 3/3 toileting tasks with min A. OT Short Term Goal 3 (Week 1): Pt will don pants with min A by being able to pull pants over hips herself. OT Short Term Goal 4 (Week 1): Pt will demonstrate improved endurance by tolerating standing at sink to brush her teeth.   Skilled Therapeutic Interventions/Progress Updates:    1:1 Pt received in the bed. Self care retraining at EOB with focus on endurance, activity tolerance, sit to stands, functional ambulation, toileting etc.  Pt came to EOB with supervision. BP in sitting (before standing) was 132/80. Pt does not report an symptoms.  Assisted with bathing pt's Lower legs for time while her body acclimated to sitting EOB and to don her TEDS. Pt then bathed her UB and donned back on her gown/ dress with set up. Pt required A to don abdominal binder. Pt stood to take bp and it was 106/69; however no complaints. After seated rest break ; Pt was able to ambulate 12 feet (bed to toilet) at a very slow speed but with close supervision/ contact guard. Pt voided her bladder and bowels. Pt able to perform front hygiene but got stool on hand when attempted backside requiring help to clean hand and finish hygiene. Pt required frequented seated rest breaks. Also donned clean brief with assistance. Ambulated out of batyhroom and to w/c at the door ~5 feet with contact guard. BP 134/87    2nd session: 1:1 Pt resting in bed when arrived. Came to EOB with supervision; donned abdominal binder with total A. Ambulated to the bathroom with more than  reasonable amt of time. Pt performed toileting with max A (requiring A for clothing management - up and down). Pt continues to have a 20 systolic BP drop with being up and moving. Wrapped LEs with ace wrap with ace wraps on top of TEDS to thighs. BP maintained around 115 systolicly. Ambulated to the sink and after a seated rest break/ stood to wash hands.   Taken to the gym and engaged in game of connect four in standing. Pt able to tolerate ~ 3 min in standing before needing a seated rest break. Also performed standing without UE support. Did 4 bouts of standing with game (including in retrieving pieces off the floor with a reacher). REturned to her room and doff binder and returned to supine in bed with extra time with supervision. Daughter present at end of session and provided education on her performance with ADL tasks- reporting she can do most of the tasks but required extra time for all tasks due to low endurance and orthostatic BPs.  Left resting in bed.   Therapy Documentation Precautions:  Precautions Precautions: Fall Required Braces or Orthoses: Other Brace Other Brace: pt wears neoprene type sleeve on B knees due to arthritic pain Restrictions Weight Bearing Restrictions: No RLE Weight Bearing: Weight bearing as tolerated LLE Weight Bearing: Weight bearing as tolerated General:   Vital Signs:  Pain:  No pain in session 1   No pain in second session   Therapy/Group:  Individual Therapy  Roney Mans [facility] 01/17/2021, 12:47 PM

## 2021-01-17 NOTE — Progress Notes (Signed)
Physical Therapy Session Note  Patient Details  Name: Jane Navarro MRN: [id number] Date of Birth: [birth year]  Today's Date: 01/17/2021 PT Individual Time: 1015-1055 PT Individual Time Calculation (min): 40 min   Short Term Goals: Week 1:  PT Short Term Goal 1 (Week 1): Pt will ascend/descend 4 6" steps w/BUE support and min A PT Short Term Goal 2 (Week 1): Pt will ambulate 25' w/LRAD and min A PT Short Term Goal 3 (Week 1): Pt will self-propel 50' in [facility] w/CGA  Skilled Therapeutic Interventions/Progress Updates:   Received pt sitting in WC, pt agreeable to PT treatment, and denied any pain during session. Session with emphasis on functional mobility/transfers, generalized strengthening, dynamic standing balance/coordination, gait training, toileting, and improved activity tolerance. Ted hose and abdominal binder donned throughout session. BP sitting in WC: 118/73 - pt asymptomatic. Pt transported to/from gym in [facility] total A for time management purposes. Pt transferred sit<>stand with RW and CGA and ambulated 10ft x 1 and 76ft x 2 with RW and CGA - mild dizziness that resolved with rest; BP 129/78 on trial 1, 121/75 on trial 2, and 112/72 on trial 3. Pt reported urge to toilet and ambulated 46ft with RW and CGA into bathroom. Pt able to manage clothing with mod A overall and void and with small BM;  required total A for peri-care in standing. Took seated rest break on commode then ambulated additional 46ft with RW and CGA to bed and transferred sit<>supine with supervision. Pt scooted to [facility] with supervision pulling on headboard and therapist removed socks dependently. Pt very pleased with walking distances from session and stated multiple times "make sure you write that down". Concluded session with pt semi-reclined in bed, needs within reach, and bed alarm on.   Therapy Documentation Precautions:  Precautions Precautions: Fall Required Braces or Orthoses: Other Brace Other Brace: pt wears neoprene  type sleeve on B knees due to arthritic pain Restrictions Weight Bearing Restrictions: No RLE Weight Bearing: Weight bearing as tolerated LLE Weight Bearing: Weight bearing as tolerated  Therapy/Group: Individual Therapy Martin Majestic PT, DPT   01/17/2021, 7:20 AM

## 2021-01-18 LAB — BASIC METABOLIC PANEL
Anion gap: 5 (ref 5–15)
BUN: 13 mg/dL (ref 8–23)
CO2: 25 mmol/L (ref 22–32)
Calcium: 8.4 mg/dL — ABNORMAL LOW (ref 8.9–10.3)
Chloride: 107 mmol/L (ref 98–111)
Creatinine, Ser: 0.76 mg/dL (ref 0.44–1.00)
GFR, Estimated: >60 mL/min (ref >60)
Glucose, Bld: 83 mg/dL (ref 70–99)
Potassium: 3.7 mmol/L (ref 3.5–5.1)
Sodium: 137 mmol/L (ref 135–145)

## 2021-01-18 MED ORDER — OXYCODONE-ACETAMINOPHEN 5-325 MG PO TABS
1.0000 | ORAL_TABLET | Freq: Three times a day (TID) | ORAL | Status: DC | PRN
Start: 2021-01-18 — End: 2021-01-19

## 2021-01-18 NOTE — Progress Notes (Signed)
Physical Therapy Session Note  Patient Details  Name: Jane Navarro MRN: [id number] Date of Birth: 01/01/1946  Today's Date: 01/18/2021 PT Individual Time: 1430-1454 PT Individual Time Calculation (min): 24 min   Short Term Goals: Week 1:  PT Short Term Goal 1 (Week 1): Pt will ascend/descend 4 6" steps w/BUE support and min A PT Short Term Goal 2 (Week 1): Pt will ambulate 25' w/LRAD and min A PT Short Term Goal 3 (Week 1): Pt will self-propel 50' in [facility] w/CGA  Skilled Therapeutic Interventions/Progress Updates:  Pt seen for unscheduled therapy session. Pt received seated in WC in room, denied pain and agreeable to PT. Emphasis of session on gait training and improved activity tolerance. Pt performed sit <>stands from [facility] throughout session w/S*.   Gait training: -Pt ambulated 12', 50' and 64' w/RW and CGA. Noted pt performs better w/distractions (50' and 66' in hallway while people watching). Pt demonstrates kyphotic posture, decreased step length bilaterally and decreased cadence. Verbal cues to maintain safe distance to RW, specifically while coming to sit or during turns. Pt very pleased w/walking distance but does not feel she is ready to go home Saturday. Reminded pt of all her progress in the past week and provided encouragement, but pt still leery.   Pt was left seated in WC in room, all needs in reach.   Therapy Documentation Precautions:  Precautions Precautions: Fall Required Braces or Orthoses: Other Brace Other Brace: pt wears neoprene type sleeve on B knees due to arthritic pain Restrictions Weight Bearing Restrictions: No RLE Weight Bearing: Weight bearing as tolerated LLE Weight Bearing: Weight bearing as tolerated   Therapy/Group: Individual Therapy [name] Jaselle Pryer, PT, DPT  01/18/2021, 2:35 PM

## 2021-01-18 NOTE — Progress Notes (Signed)
Occupational Therapy Session Note  Patient Details  Name: Jane Navarro MRN: [id number] Date of Birth: 04/05/1945  Today's Date: 01/18/2021 OT Individual Time: 9622-2979 OT Individual Time Calculation (min): 30 min    Short Term Goals: Week 1:  OT Short Term Goal 1 (Week 1): Pt will be able to complete toilet transfers with min A. OT Short Term Goal 2 (Week 1): Pt will be able to complete 3/3 toileting tasks with min A. OT Short Term Goal 3 (Week 1): Pt will don pants with min A by being able to pull pants over hips herself. OT Short Term Goal 4 (Week 1): Pt will demonstrate improved endurance by tolerating standing at sink to brush her teeth.  Skilled Therapeutic Interventions/Progress Updates:  Pt greeted seated in w/c  agreeable to OT intervention. Pt reports need to void b/b. CGA for ambulatory toilet transfer from w/c>BR with rw and CGA, pt with episode of feeling lightheaded but feeling passed once pt sitting on toilet and completed PLB. Pt required MOD A for 3/3 toileting tasks with pt able to complete anterior pericare with set- up assist but needed assist for posterior pericare in standing, pt assisting with pulling pants up to waist line in standing. Of note pt with dark loose coffee ground type BM; alerted RN. pt exited bathroom with CGA with Rw where pt returned to supine with CGA.  pt left supine in bed with bed alarm activated and all needs within reach.                     Therapy Documentation Precautions:  Precautions Precautions: Fall Required Braces or Orthoses: Other Brace Other Brace: pt wears neoprene type sleeve on B knees due to arthritic pain Restrictions Weight Bearing Restrictions: No RLE Weight Bearing: Weight bearing as tolerated LLE Weight Bearing: Weight bearing as tolerated    Pain:  Pt reports no pain during session   Therapy/Group: Individual Therapy  Barron Schmid 01/18/2021, 3:57 PM

## 2021-01-18 NOTE — Progress Notes (Signed)
PROGRESS NOTE   Subjective/Complaints: She has no new complaints this morning Asks whether her IV can be removed- she has 2 in place [employer] notes she has been doing great with swallowing water and Ensure  ROS: Denies CP, SOB, N/V/D, improved swallowing  Objective:   No results found. Recent Labs    01/16/21 0507 01/17/21 0617  WBC 5.3 5.2  HGB 9.3* 9.6*  HCT 29.7* 30.6*  PLT 326 308   Recent Labs    01/17/21 0617 01/18/21 0557  NA 138 137  K 3.7 3.7  CL 108 107  CO2 25 25  GLUCOSE 78 83  BUN 10 13  CREATININE 0.66 0.76  CALCIUM 8.3* 8.4*    Intake/Output Summary (Last 24 hours) at 01/18/2021 1219 Last data filed at 01/18/2021 0800 Gross per 24 hour  Intake 430 ml  Output --  Net 430 ml        Physical Exam: Vital Signs Blood pressure 130/62, pulse 74, temperature 98.6 F (37 C), temperature source Oral, resp. rate 16, height 5' (1.524 m), weight 59.5 kg, SpO2 96 %. Gen: no distress, normal appearing HEENT: oral mucosa pink and moist, NCAT Cardio: Reg rate Chest: normal effort, normal rate of breathing Abd: soft, non-distended Ext: no edema Psych: Normal mood.  Normal behavior. Musc: No edema in extremities.  No tenderness in extremities. Neuro: Alert and oriented Motor: 4/5 throughout  Assessment/Plan: 1. Functional deficits which require 3+ hours per day of interdisciplinary therapy in a comprehensive inpatient rehab setting. Physiatrist is providing close team supervision and 24 hour management of active medical problems listed below. Physiatrist and rehab team continue to assess barriers to discharge/monitor patient progress toward functional and medical goals  Care Tool:  Bathing    Body parts bathed by patient: Right arm, Left arm, Chest, Abdomen, Right upper leg, Face, Front perineal area, Left upper leg, Right lower leg, Left lower leg, Buttocks   Body parts bathed by helper:  Right lower leg, Left lower leg, Buttocks     Bathing assist Assist Level: Contact Guard/Touching assist     Upper Body Dressing/Undressing Upper body dressing   What is the patient wearing?: Dress Orthosis activity level: Performed by helper  Upper body assist Assist Level: Supervision/Verbal cueing    Lower Body Dressing/Undressing Lower body dressing      What is the patient wearing?: Incontinence brief     Lower body assist Assist for lower body dressing: Minimal Assistance - Patient > 75%     Toileting Toileting    Toileting assist Assist for toileting: Moderate Assistance - Patient 50 - 74%     Transfers Chair/bed transfer  Transfers assist     Chair/bed transfer assist level: Supervision/Verbal cueing     Locomotion Ambulation   Ambulation assist   Ambulation activity did not occur: Safety/medical concerns (Fatigue, syncope)  Assist level: Contact Guard/Touching assist Assistive device: Walker-rolling Max distance: 39ft   Walk 10 feet activity   Assist  Walk 10 feet activity did not occur: Safety/medical concerns (Fatigue, syncope)  Assist level: Contact Guard/Touching assist Assistive device: Walker-rolling   Walk 50 feet activity   Assist Walk 50 feet with 2 turns  activity did not occur: Safety/medical concerns (Fatigue, syncope)         Walk 150 feet activity   Assist Walk 150 feet activity did not occur: Safety/medical concerns (Fatigue, syncope)         Walk 10 feet on uneven surface  activity   Assist Walk 10 feet on uneven surfaces activity did not occur: Safety/medical concerns (Fatigue, syncope)         Wheelchair     Assist Is the patient using a wheelchair?: Yes Type of Wheelchair: Manual Wheelchair activity did not occur: Safety/medical concerns (Fatigue, syncope)         Wheelchair 50 feet with 2 turns activity    Assist    Wheelchair 50 feet with 2 turns activity did not occur:  Safety/medical concerns (Fatigue, syncope)       Wheelchair 150 feet activity     Assist  Wheelchair 150 feet activity did not occur: Safety/medical concerns (Fatigue, syncope)       Blood pressure 130/62, pulse 74, temperature 98.6 F (37 C), temperature source Oral, resp. rate 16, height 5' (1.524 m), weight 59.5 kg, SpO2 96 %.  Medical Problem List and Plan: 1.  Cardiopulmonary debility s/p NSTEMI, CHF Continue CIR  2.  Impaired mobility: -DVT/anticoagulation:  Pharmaceutical: Lovenox             -antiplatelet therapy: Continue ASA/Brillinta 3. Postoperative pain: Decrease Percocet to 1 tab q8H prn   Monitor increased exertion 4. Mood: LCSW to follow for evaluation and support.              -antipsychotic agents: N/a 5. Neuropsych: This patient is capable of making decisions on her own behalf. 6. Skin/Wound Care: Routine pressure relief measures.  7. Fluids/Electrolytes/Nutrition: Monitor I/Os  BMP within acceptable range on 10/17 8. NSTEMI/CAD s/p PCI/stent: Monitor for symptoms with increase in activity.        9. Acute systolic HF: Heart healthy diet. Check daily weights--admit wt 141 lbs. --Monitor for symptoms of overload.  --On spironolactone, Cozaar, Digoxin, ASA/Brillinta and Lipitor  Filed Weights   01/16/21 0516 01/17/21 0404 01/18/21 0452  Weight: 61.2 kg 63.3 kg 59.5 kg  Stable on 10/14 10. Hypothyroid: On supplement for past month.  TSH ordered per cards             --Improving from 41.4-->4.8 in three weeks.  11. AKI: Resolved 12. Shocked liver:   ALT elevated, but improving on 10/11  13. Acute blood loss anemia: Groin hematomas appear resolved.  Hemoglobin 9.0 on 10/14 14. Low albumin levels: Continue Ensure BID--family encouraged to bring low salt foods from home. 15. Bilateral Knee pain: Voltaren gel added to help manage symptoms.   Controlled with meds on 10/14 16. Constipation: Increased senna to bid (does not like daily miralax-->d/c and  continue MOM  17. Orthostasis w/syncope:              --change to THT and order abdominal binder.              --encourage oral intake.   Orthostatics negative on 10/13, however patient with symptoms consistent with orthostasis  Continue midodrine 5mg  BID- seems to be helping  LOS: 7 days A FACE TO FACE EVALUATION WAS PERFORMED  P Nykole Matos 01/18/2021, 12:19 PM

## 2021-01-18 NOTE — Progress Notes (Signed)
Occupational Therapy Session Note  Patient Details  Name: Jane Navarro MRN: [id number] Date of Birth: 06/28/1945  Today's Date: 01/18/2021 OT Individual Time: 4481-8563 OT Individual Time Calculation (min): 60 min    Short Term Goals: Week 1:  OT Short Term Goal 1 (Week 1): Pt will be able to complete toilet transfers with min A. OT Short Term Goal 2 (Week 1): Pt will be able to complete 3/3 toileting tasks with min A. OT Short Term Goal 3 (Week 1): Pt will don pants with min A by being able to pull pants over hips herself. OT Short Term Goal 4 (Week 1): Pt will demonstrate improved endurance by tolerating standing at sink to brush her teeth.  Skilled Therapeutic Interventions/Progress Updates:    Pt received in bed agreeable to therapy and requesting to use the bathroom.  Sat to EOB with S and sat for 3-4 minutes while moving arms and legs to get adjusted to be upright.  Seated BP 120/79, Pt then completed stand pivot bed to wc then to toilet with supervison only. BP after 2 stand pivot transfers with no binder or teds 120/70. Urinated and had gas on toilet. Pt stood to cleanse for 1 minute but then got light headed so sat down.  BP 109/71. Stood 2nd time to cleanse front and back BP 100/84. Pt able to reach both front and back to cleanse well.  She is not incontinent so discussed using regular underwear. No underwear in room so she will ask her daughter to bring some in, so used hospital brief today. From sitting on toilet, pt bathed self with supervision, applied lotion to legs and stood with supervision.  She tends to want to turn at a 90 degree angle to hold grab bar with B hands, so needed cues to only use L hand on bar so she would have her R hand free to use.  Her balance is quite strong, pt just does not have the confidence.  She was able to see today that she could hold her balance for 2-3 minutes with minimal arm support.  Pt stated that she did not feel dizzy.  Before ambulating  to bed, donned TEDS and abd binder. Pt used RW with Supervision only, ambulated 12 feet to EOB. Once sitting she stated she felt fine.  Moved to supine without A.  Pt adjusted herself in bed, alarm set and all needs met.    Therapy Documentation Precautions:  Precautions Precautions: Fall Required Braces or Orthoses: Other Brace Other Brace: pt wears neoprene type sleeve on B knees due to arthritic pain Restrictions Weight Bearing Restrictions: No RLE Weight Bearing: Weight bearing as tolerated LLE Weight Bearing: Weight bearing as tolerated   Pain: Pain Assessment Pain Score: 0-No pain ADL: ADL Eating: Set up Grooming: Supervision/safety Upper Body Bathing: Supervision/safety Where Assessed-Upper Body Bathing: Other (Comment) (toilet) Lower Body Bathing: Contact guard Where Assessed-Lower Body Bathing:  (toilet) Upper Body Dressing: Supervision/safety Where Assessed-Upper Body Dressing:  (toilet) Lower Body Dressing: Moderate assistance Where Assessed-Lower Body Dressing:  (toilet) Toileting: Moderate assistance Where Assessed-Toileting: Glass blower/designer: Close supervision Toilet Transfer Method: Arts development officer: Grab bars   Therapy/Group: Individual Therapy  [place] 01/18/2021, 9:31 AM

## 2021-01-18 NOTE — Progress Notes (Signed)
Physical Therapy Session Note  Patient Details  Name: Jane Navarro MRN: [id number] Date of Birth: 11-Sep-1945  Today's Date: 01/18/2021 PT Individual Time: 6283-6629 PT Individual Time Calculation (min): 54 min   Short Term Goals: Week 1:  PT Short Term Goal 1 (Week 1): Pt will ascend/descend 4 6" steps w/BUE support and min A PT Short Term Goal 2 (Week 1): Pt will ambulate 25' w/LRAD and min A PT Short Term Goal 3 (Week 1): Pt will self-propel 50' in [facility] w/CGA  Skilled Therapeutic Interventions/Progress Updates:  Pt received supine in bed, eating lunch w/son. Denied pain and was agreeable to PT. Pt performed bed mobility w/S*, BP in sitting at 122/85 mmHg. Pt performed sit <>stand from EOB w/S* and ambulated 15' to bathroom w/CGA, heavy verbal cues for safe use of AD and encouragement to walk the full 17' without taking a break. Doffed briefs w/CGA after encouraging pt to do as much as possible herself, stand to sit on [facility] w/CGA. Pt lacks confidence and exhibits fear-avoidance behavior, explained to her that she is much stronger and can walk 20+ ft so that she can be as independent as possible when she goes home. Pt verbalized agreement. Pt voided continently and had small BM, sit <>stand from [facility] w/CGA and performed peri care w/total A. Donned briefs w/max A and pt ambulated 15' to [facility] w/RW and CGA slowly. Verbal cues to maintain safe distance to RW, specifically while turning and reaching back to WC. Pt transported to [facility] therapy gym w/total A for time management.   Therex -> pre-stair training for improved single leg stability, BLE strength and activity tolerance: -Sit <>stands from [facility] throughout session w/CGA to RW - Alt. Toe taps to 3" step, x10 per side w/BUE support on RW and S*. Noted improved step clearance compared to marches performed Friday.  -Progressed to alt. Step-ups on 3" step w/BUE support on RW and CGA, x 1 rep per side. Pt became very anxious and requested to sit, attempted to  calm pt down verbally and talked her through diaphragmatic breathing. Pt has tendency to valsalva during activities that make her nervous, resulting in her feeling lightheaded. Educated pt on maintaining her diaphragmatic breathing during these stressful activities to better manage her nerves, pt verbalized agreement.   Pt transported back to room w/total A due to fatigue and was encouraged to sit up in chair for 1hr until next session for improved stamina. Pt agreed and was left seated in WC in room, all needs in reach.   Therapy Documentation Precautions:  Precautions Precautions: Fall Required Braces or Orthoses: Other Brace Other Brace: pt wears neoprene type sleeve on B knees due to arthritic pain Restrictions Weight Bearing Restrictions: No RLE Weight Bearing: Weight bearing as tolerated LLE Weight Bearing: Weight bearing as tolerated   Therapy/Group: Individual Therapy [name] [place]venly Rober[place], PT, DPT  01/18/2021, 7:54 AM

## 2021-01-18 NOTE — Progress Notes (Signed)
Patient ID: Jane Navarro, female   DOB: November 05, 1945, 75 y.o.   MRN: [id number]  Spoke with [name] via telephone to inform her FMLA forms for she and her brother are complete and will leave them in pt's dresser drawer. She will retrieve them from there. Aware team conference tomorrow will update her after this.

## 2021-01-18 NOTE — Progress Notes (Signed)
FMLA forms filled out for [employer] and Jane Navarro. Need for 24 hours supervision/safety has been reiterated to family.

## 2021-01-19 LAB — BASIC METABOLIC PANEL
Anion gap: 6 (ref 5–15)
BUN: 13 mg/dL (ref 8–23)
CO2: 26 mmol/L (ref 22–32)
Calcium: 8.6 mg/dL — ABNORMAL LOW (ref 8.9–10.3)
Chloride: 107 mmol/L (ref 98–111)
Creatinine, Ser: 0.73 mg/dL (ref 0.44–1.00)
GFR, Estimated: >60 mL/min (ref >60)
Glucose, Bld: 85 mg/dL (ref 70–99)
Potassium: 3.7 mmol/L (ref 3.5–5.1)
Sodium: 139 mmol/L (ref 135–145)

## 2021-01-19 MED ORDER — OXYCODONE-ACETAMINOPHEN 5-325 MG PO TABS: 1.0000 | ORAL_TABLET | Freq: Two times a day (BID) | ORAL | Status: DC | PRN

## 2021-01-19 MED ORDER — MIDODRINE HCL 5 MG PO TABS
5.0000 mg | ORAL_TABLET | Freq: Three times a day (TID) | ORAL | Status: DC
  Administered 2021-01-19 – 2021-01-28 (×28): 5 mg via ORAL
  Filled 2021-01-19 (×28): qty 1

## 2021-01-19 NOTE — Patient Care Conference (Signed)
Inpatient RehabilitationTeam Conference and Plan of Care Update Date: 01/19/2021   Time: 12:09 PM    Patient Name: Jane Navarro      Medical Record Number: [id number]  Date of Birth: April 09, 1945 Sex: Female         Room/Bed: 5C06C/5C06C-01 Payor Info: Payor: HU[employer] MEDICARE / Plan: HU[employer] MEDICARE HMO / Product Type: *No Product type* /    Admit Date/Time:  01/11/2021  4:09 PM  Primary Diagnosis:  Debility  Hospital Problems: Principal Problem:   Debility Active Problems:   Orthostasis   Acute blood loss anemia   Acute systolic congestive heart failure (HCC)   Postoperative pain    Expected Discharge Date: Expected Discharge Date: 01/28/21  Team Members Present: Physician leading conference: Dr. Sula Soda Social Worker Present: Dossie Der, LCSW Nurse Present: Chana Bode, RN PT Present: Ernestene Kiel, PT OT Present: Ezra Sites, OT PPS Coordinator present : Fae Pippin, SLP     Current Status/Progress Goal Weekly Team Focus  Bowel/Bladder   Continent of bowel and bladder LBM 10/16  maintain continence  routine toileting   Swallow/Nutrition/ Hydration             ADL's   improved well with mobility and self care, continues to have low endurance but has improved.   Supervision with transfers and short distance ambulation for toileting.  min A LB dressing and toileting, supervision bathing.  supervision  ADL training, endurance training, pt/fam education   Mobility   CGA transfers, gait up to 61' w/RW, S* bed mobility  S*  Cardiovascular endurance, fear-avoidance behavior, pt edu   Communication             Safety/Cognition/ Behavioral Observations            Pain             Skin   MASD to groin  Maintain skin integrity, no new issues  assess effectiveness of antifungal topical to groin, toilet routinely and avoid excess moisture on skin     Discharge Planning:  Children deciding on plan pt, to go to son's and decide hired assist versus other  option. Awaiting pt's progress. Both daughter and son have complered FMLA papers   Team Discussion: Family expressed concern about discharge given poor standing tolerance and dizziness. MD adjusted midodrine and HF meds. Progress limited by significant activitiy intolerance and low endurance level.  Patient on target to meet rehab goals: Yes, able to ambulate up to 160' with supervision.   *See Care Plan and progress notes for long and short-term goals.   Revisions to Treatment Plan:  Practicing steps, and extended LOS to address endurance and activity tolerance Upgraded goals to Mod I   Teaching Needs: Safety, transfers, medications, secondary risk management, etc  Current Barriers to Discharge: Decreased caregiver support and Home enviroment access/layout  Possible Resolutions to Barriers: Family education Private duty care information to children Children have completed FMLA paperwork     Medical Summary Current Status: AKI, orthostatic hypotension, overweight, impaired endurance  Barriers to Discharge: Medical stability  Barriers to Discharge Comments: AKI, orthostatic hypotension, overweight, impaired endurance Possible Resolutions to [employer] Focus: encourage adequat hydration, increased midodrine to 5mg  three times per day, provide dietary education   Continued Need for Acute Rehabilitation Level of Care: The patient requires daily medical management by a physician with specialized training in physical medicine and rehabilitation for the following reasons: Direction of a multidisciplinary physical rehabilitation program to maximize functional independence : Yes Medical management  of patient stability for increased activity during participation in an intensive rehabilitation regime.: Yes Analysis of laboratory values and/or radiology reports with any subsequent need for medication adjustment and/or medical intervention. : Yes   I attest that I was present, lead the  team conference, and concur with the assessment and plan of the team.   Chana Bode B 01/19/2021, 2:40 PM

## 2021-01-19 NOTE — Progress Notes (Addendum)
PROGRESS NOTE   Subjective/Complaints: She asks whether she can be extended for a week. As per OT she is making great progress and is motivated to participate, but is limited in her endurance.  Continues to have dizziness, though improved, with therapy  ROS: Denies CP, SOB, N/V/D, improved swallowing, +dizziness  Objective:   No results found. Recent Labs    01/17/21 0617  WBC 5.2  HGB 9.6*  HCT 30.6*  PLT 308   Recent Labs    01/18/21 0557 01/19/21 0505  NA 137 139  K 3.7 3.7  CL 107 107  CO2 25 26  GLUCOSE 83 85  BUN 13 13  CREATININE 0.76 0.73  CALCIUM 8.4* 8.6*    Intake/Output Summary (Last 24 hours) at 01/19/2021 1100 Last data filed at 01/19/2021 0756 Gross per 24 hour  Intake 440 ml  Output --  Net 440 ml        Physical Exam: Vital Signs Blood pressure 131/72, pulse 71, temperature 98.4 F (36.9 C), temperature source Oral, resp. rate 18, height 5' (1.524 m), weight 61.8 kg, SpO2 92 %. Gen: no distress, normal appearing HEENT: oral mucosa pink and moist, NCAT Cardio: Reg rate Chest: normal effort, normal rate of breathing Abd: soft, non-distended Ext: no edema Psych: Normal mood.  Normal behavior. Musc: No edema in extremities.  No tenderness in extremities. Neuro: Alert and oriented Motor: 4/5 throughout  Assessment/Plan: 1. Functional deficits which require 3+ hours per day of interdisciplinary therapy in a comprehensive inpatient rehab setting. Physiatrist is providing close team supervision and 24 hour management of active medical problems listed below. Physiatrist and rehab team continue to assess barriers to discharge/monitor patient progress toward functional and medical goals  Care Tool:  Bathing    Body parts bathed by patient: Right arm, Left arm, Chest, Abdomen, Right upper leg, Face, Front perineal area, Left upper leg, Right lower leg, Left lower leg, Buttocks   Body  parts bathed by helper: Right lower leg, Left lower leg, Buttocks     Bathing assist Assist Level: Contact Guard/Touching assist     Upper Body Dressing/Undressing Upper body dressing   What is the patient wearing?: Dress Orthosis activity level: Performed by helper  Upper body assist Assist Level: Supervision/Verbal cueing    Lower Body Dressing/Undressing Lower body dressing      What is the patient wearing?: Underwear/pull up     Lower body assist Assist for lower body dressing: Minimal Assistance - Patient > 75%     Toileting Toileting    Toileting assist Assist for toileting: Contact Guard/Touching assist     Transfers Chair/bed transfer  Transfers assist     Chair/bed transfer assist level: Contact Guard/Touching assist     Locomotion Ambulation   Ambulation assist   Ambulation activity did not occur: Safety/medical concerns (Fatigue, syncope)  Assist level: Contact Guard/Touching assist Assistive device: Walker-rolling Max distance: 62'   Walk 10 feet activity   Assist  Walk 10 feet activity did not occur: Safety/medical concerns (Fatigue, syncope)  Assist level: Contact Guard/Touching assist Assistive device: Walker-rolling   Walk 50 feet activity   Assist Walk 50 feet with 2 turns activity did  not occur: Safety/medical concerns (Fatigue, syncope)  Assist level: Contact Guard/Touching assist Assistive device: Walker-rolling    Walk 150 feet activity   Assist Walk 150 feet activity did not occur: Safety/medical concerns (Fatigue, syncope)         Walk 10 feet on uneven surface  activity   Assist Walk 10 feet on uneven surfaces activity did not occur: Safety/medical concerns (Fatigue, syncope)         Wheelchair     Assist Is the patient using a wheelchair?: Yes Type of Wheelchair: Manual Wheelchair activity did not occur: Safety/medical concerns (Fatigue, syncope)         Wheelchair 50 feet with 2 turns  activity    Assist    Wheelchair 50 feet with 2 turns activity did not occur: Safety/medical concerns (Fatigue, syncope)       Wheelchair 150 feet activity     Assist  Wheelchair 150 feet activity did not occur: Safety/medical concerns (Fatigue, syncope)       Blood pressure 131/72, pulse 71, temperature 98.4 F (36.9 C), temperature source Oral, resp. rate 18, height 5' (1.524 m), weight 61.8 kg, SpO2 92 %.  Medical Problem List and Plan: 1.  Cardiopulmonary debility s/p NSTEMI, CHF Continue CIR  -Interdisciplinary Team Conference today   2.  Impaired mobility: -DVT/anticoagulation:  Pharmaceutical: Lovenox             -antiplatelet therapy: Continue ASA/Brillinta 3. Postoperative pain: Decrease Percocet to 1 tab q12H prn   Monitor increased exertion 4. Mood: LCSW to follow for evaluation and support.              -antipsychotic agents: N/a 5. Neuropsych: This patient is capable of making decisions on her own behalf. 6. Skin/Wound Care: Routine pressure relief measures.  7. Fluids/Electrolytes/Nutrition: Monitor I/Os  BMP within acceptable range on 10/18 8. NSTEMI/CAD s/p PCI/stent: Monitor for symptoms with increase in activity.        9. Acute systolic HF: Heart healthy diet. Check daily weights--admit wt 141 lbs. --Monitor for symptoms of overload.  --On spironolactone, Cozaar, Digoxin, ASA/Brillinta and Lipitor  Filed Weights   01/17/21 0404 01/18/21 0452 01/19/21 0448  Weight: 63.3 kg 59.5 kg 61.8 kg   10. Hypothyroid: On supplement for past month.  TSH ordered per cards             --Improving from 41.4-->4.8 in three weeks.  11. AKI: Resolved 12. Shocked liver:   ALT elevated, but improving on 10/11  13. Acute blood loss anemia: Groin hematomas appear resolved.  Hemoglobin 9.0 on 10/14 14. Low albumin levels: Continue Ensure BID--family encouraged to bring low salt foods from home. 15. Bilateral Knee pain: Voltaren gel added to help manage symptoms.    Controlled with voltaren on 10/18 16. Constipation: Increased senna to bid (does not like daily miralax-->d/c and continue MOM  17. Orthostasis w/syncope:              --change to THT and order abdominal binder.              --encourage oral intake.   Orthostatics negative on 10/13, however patient with symptoms consistent with orthostasis  Increase midodrine 5mg  TID- seems to be helping   LOS: 8 days A FACE TO FACE EVALUATION WAS PERFORMED  Brentlee Sciara P Maie Kesinger 01/19/2021, 11:00 AM

## 2021-01-19 NOTE — Progress Notes (Signed)
Physical Therapy Weekly Progress Note  Patient Details  Name: Jane Navarro MRN: [id number] Date of Birth: 11/13/1945  Beginning of progress report period: January 12, 2021 End of progress report period: January 19, 2021  Today's Date: 01/19/2021 PT Individual Time: 1058-1154 PT Individual Time Calculation (min): 56 min   Patient has met 1 of 3 short term goals. Pt is ambulating up to 156' w/RW and S* and has greatly improved cardiovascular endurance. Pt has not attempted stairs due to previous syncope episodes, decreased standing tolerance and fear-avoidance behavior. Pt is progressing towards WC mobility but requires assistance for hand placement and fatigue. Pt's ambulation LTG has been upgraded to mod I due to pt's plan to DC to son's home without 24/7 assist and current functional mobility. Pt's family has not participated in family training at this time but will be planned prior to DC.   Patient continues to demonstrate the following deficits muscle weakness and decreased cardiorespiratoy endurance and therefore will continue to benefit from skilled PT intervention to increase functional independence with mobility.  Patient progressing toward long term goals..  Continue plan of care.  PT Short Term Goals Week 1:  PT Short Term Goal 1 (Week 1): Pt will ascend/descend 4 6" steps w/BUE support and min A PT Short Term Goal 1 - Progress (Week 1): Progressing toward goal PT Short Term Goal 2 (Week 1): Pt will ambulate 25' w/LRAD and min A PT Short Term Goal 2 - Progress (Week 1): Met PT Short Term Goal 3 (Week 1): Pt will self-propel 50' in WC w/CGA PT Short Term Goal 3 - Progress (Week 1): Progressing toward goal Week 2:  PT Short Term Goal 1 (Week 2): STG = LTG due to LOS  Skilled Therapeutic Interventions/Progress Updates:  Pt received seated in WC in room, in higher spirits compared to earlier session and happy regarding conversation to extend her LOS by one week. Pt denied pain and  was agreeable to PT. Emphasis of session on improved activity tolerance, cardiovascular endurance and pre-stair training. Pt transported to 5C therapy gym w/total A for time management. Sit <>stands performed throughout session w/S*.   NuStep level 3 for 2 min intervals, 1 min rest in between for improved cardiovascular endurance and pacing: -Round 1: 71 steps, RPE 11   -Round 2: 112 steps, RPE 13 -Round 3: 121 steps, RPE 13  -Round 4: 120 steps, RPE 13  -Round 5: 130 steps, RPE 13   Gait training for improved endurance and independence  -Pt ambulated 157' w/RW and S* from 5C gym to personal bathroom without rest breaks. Noted kyphotic posture, heavy reliance on UES, decreased step length/clearance and decreased cadence. RPE after walk at 13.   Pt performed toilet transfer w/CGA, voided continently and performed peri care w/set-up assist. Sit <>stand from toilet w/S* and pt ambulated 8' to sink w/S* and RW and performed hand hygiene without UE support and S*. Pt ambulated 10' to bed w/RW and performed sit <>supine w/S*. Pt was left supine in bed, son present w/lunch, all needs in reach.   Therapy Documentation Precautions:  Precautions Precautions: Fall Required Braces or Orthoses: Other Brace Other Brace: pt wears neoprene type sleeve on B knees due to arthritic pain Restrictions Weight Bearing Restrictions: No RLE Weight Bearing: Non weight bearing LLE Weight Bearing: Non weight bearing   Therapy/Group: Individual Therapy   E , PT, DPT 01/19/2021, 7:44 AM  

## 2021-01-19 NOTE — Progress Notes (Signed)
Occupational Therapy Weekly Progress Note  Patient Details  Name: Jane Navarro MRN: [id number] Date of Birth: [birth year]  Beginning of progress report period: January 12, 2021 End of progress report period: January 19, 2021  Today's Date: 01/19/2021 OT Individual Time: 6269-4854 OT Individual Time Calculation (min): 60 min    Patient has met 3 of 4 short term goals.  Pt is making gradual progress with all self care. She is mostly limited by her low activity tolerance as she can only tolerate standing up for about a minute at a time or ambulating short distances in the room.   Patient continues to demonstrate the following deficits: muscle weakness, decreased cardiorespiratoy endurance, and decreased standing balance and decreased balance strategies and therefore will continue to benefit from skilled OT intervention to enhance overall performance with BADL and Reduce care partner burden.  Patient progressing toward long term goals..  Continue plan of care.  OT Short Term Goals Week 1:  OT Short Term Goal 1 (Week 1): Pt will be able to complete toilet transfers with min A. OT Short Term Goal 1 - Progress (Week 1): Met OT Short Term Goal 2 (Week 1): Pt will be able to complete 3/3 toileting tasks with min A. OT Short Term Goal 2 - Progress (Week 1): Met OT Short Term Goal 3 (Week 1): Pt will don pants with min A by being able to pull pants over hips herself. OT Short Term Goal 3 - Progress (Week 1): Met OT Short Term Goal 4 (Week 1): Pt will demonstrate improved endurance by tolerating standing at sink to brush her teeth. OT Short Term Goal 4 - Progress (Week 1): Progressing toward goal Week 2:  OT Short Term Goal 1 (Week 2): Pt will be able to tolerate standing for 3 minutes to be able to brush teeth at sink. OT Short Term Goal 2 (Week 2): Pt will complete 3/3 steps of toileting with supervision. OT Short Term Goal 3 (Week 2): Pt will be able to don underwear and socks over feet with  S.  Skilled Therapeutic Interventions/Progress Updates:    Pt received in bed and stated, "I am not going to do therapy until I get to meet with the doctor. I am NOT ready to go home Saturday."   Was able to talk to patient about her concerns and reassured her she would get to talk with the doctor this am. Pt sat to EOB with S.  She is able to sit to stand from EOB to RW with S with cues to push up with B hands. Had pt remove hospital brief to don mesh underwear. Sitting EOB she donned over R foot but needed A with L due to limited knee motion. Will try L leg first tomorrow.  In standing, pt able to pull over hips without help.  After a seated rest, ambulated to sink to brush teeth but after 1 minute she requested to sit.  Tried to have pt stand again,  but she said she had to sit. Brushed teeth sitting using spit tray.   MD came in at this point to talk to pt.   She then ambulated 10 ft to the toilet. Pt able to manage pants up and down herself and cleanse herself.  Urinated only. Then ambulated to wc in room. Adjusted in wc with belt alarm and all needs met.   Therapy Documentation Precautions:  Precautions Precautions: Fall Required Braces or Orthoses: Other Brace Other Brace: pt wears neoprene type sleeve  on B knees due to arthritic pain Restrictions Weight Bearing Restrictions: No RLE Weight Bearing: Non weight bearing LLE Weight Bearing: Non weight bearing  Vital Signs: Therapy Vitals Pulse Rate: 71 (apical) Pain: Pain Assessment Pain Scale: 0-10 Pain Score: 0-No pain ADL: ADL Eating: Set up Grooming: Supervision/safety Where Assessed-Grooming: Sitting at sink Upper Body Bathing: Supervision/safety Where Assessed-Upper Body Bathing: Other (Comment) (toilet) Lower Body Bathing: Contact guard Where Assessed-Lower Body Bathing:  (toilet) Upper Body Dressing: Supervision/safety Where Assessed-Upper Body Dressing:  (toilet) Lower Body Dressing: Moderate assistance Where  Assessed-Lower Body Dressing:  (toilet) Toileting: Contact guard Where Assessed-Toileting: Glass blower/designer: Close supervision Toilet Transfer Method: Arts development officer: Grab bars   Therapy/Group: Individual Therapy  [place] 01/19/2021, 10:02 AM

## 2021-01-19 NOTE — Progress Notes (Addendum)
Advanced Heart Failure Rounding Note  PCP-Cardiologist: Parke Poisson, MD  HF: Dr. Gala Romney  Subjective:   01/11/21 Moved to CIR  10/12: dizzy and orthostatic. Midodrine added. Abdominal binder added.   Remains off spiro and losartan due to orthostatic hypotension. Now improved. Less dizziness. No syncope/ near syncope. BP stable. Tolerating PT ok. Strength improving. No exertional dyspnea. No CP.   Appetite is improving. Labs normal   Objective:   Weight Range: 61.8 kg Body mass index is 26.61 kg/m.   Vital Signs:   Temp:  [98.2 F (36.8 C)-98.4 F (36.9 C)] 98.4 F (36.9 C) (10/18 0448) Pulse Rate:  [62-64] 64 (10/18 0448) Resp:  [16-18] 18 (10/18 0448) BP: (131-137)/(72-76) 131/72 (10/18 0448) SpO2:  [92 %-95 %] 92 % (10/18 0448) Weight:  [61.8 kg] 61.8 kg (10/18 0448) Last BM Date: 01/18/21  Weight change: Filed Weights   01/17/21 0404 01/18/21 0452 01/19/21 0448  Weight: 63.3 kg 59.5 kg 61.8 kg    Intake/Output:   Intake/Output Summary (Last 24 hours) at 01/19/2021 0934 Last data filed at 01/19/2021 0756 Gross per 24 hour  Intake 440 ml  Output --  Net 440 ml      Physical Exam    General:  Well appearing elderly. No respiratory difficulty HEENT: normal, dentition in poor repair  Neck: supple. no JVD. Carotids 2+ bilat; no bruits. No lymphadenopathy or thyromegaly appreciated. Cor: PMI nondisplaced. Regular rate & rhythm. No rubs, gallops or murmurs. Lungs: clear Abdomen: soft, nontender, nondistended. No hepatosplenomegaly. No bruits or masses. Good bowel sounds. Extremities: no cyanosis, clubbing, rash, edema + TED hoses  Neuro: alert & oriented x 3, cranial nerves grossly intact. moves all 4 extremities w/o difficulty. Affect pleasant.   Labs    CBC Recent Labs    01/17/21 0617  WBC 5.2  HGB 9.6*  HCT 30.6*  MCV 95.9  PLT 308   Basic Metabolic Panel Recent Labs    01/77/93 0557 01/19/21 0505  NA 137 139  K 3.7 3.7  CL  107 107  CO2 25 26  GLUCOSE 83 85  BUN 13 13  CREATININE 0.76 0.73  CALCIUM 8.4* 8.6*   Liver Function Tests No results for input(s): AST, ALT, ALKPHOS, BILITOT, PROT, ALBUMIN in the last 72 hours.  No results for input(s): LIPASE, AMYLASE in the last 72 hours. Cardiac Enzymes No results for input(s): CKTOTAL, CKMB, CKMBINDEX, TROPONINI in the last 72 hours.  BNP: BNP (last 3 results) Recent Labs    12/29/20 0014  BNP >4,500.0*    ProBNP (last 3 results) No results for input(s): PROBNP in the last 8760 hours.   D-Dimer No results for input(s): DDIMER in the last 72 hours. Hemoglobin A1C No results for input(s): HGBA1C in the last 72 hours. Fasting Lipid Panel No results for input(s): CHOL, HDL, LDLCALC, TRIG, CHOLHDL, LDLDIRECT in the last 72 hours. Thyroid Function Tests No results for input(s): TSH, T4TOTAL, T3FREE, THYROIDAB in the last 72 hours.  Invalid input(s): FREET3   Other results:   Imaging    No results found.   Medications:     Scheduled Medications:  aspirin EC  81 mg Oral Daily   atorvastatin  80 mg Oral Daily   digoxin  0.0625 mg Oral Daily   docusate sodium  200 mg Oral Daily   enoxaparin (LOVENOX) injection  40 mg Subcutaneous Q24H   ezetimibe  10 mg Oral Daily   feeding supplement  237 mL Oral BID BM  levothyroxine  100 mcg Oral Q0600   magnesium hydroxide  30 mL Oral Daily   midodrine  5 mg Oral BID WC   senna  2 tablet Oral BID   sodium chloride flush  10-40 mL Intracatheter Q12H   thiamine  100 mg Oral Daily   ticagrelor  90 mg Oral BID    Infusions:   PRN Medications: acetaminophen, alum & mag hydroxide-simeth, bisacodyl, diphenhydrAMINE, guaiFENesin-dextromethorphan, lip balm, melatonin, oxyCODONE-acetaminophen, polyethylene glycol, prochlorperazine **OR** prochlorperazine **OR** prochlorperazine, sodium chloride, sodium chloride flush, sodium phosphate    Patient Profile  Admitted 9/27 with NSTEMI complicated by  cardiogenic shock.   Assessment/Plan  1. 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 ECHO EF 25-30% and severely reduced RV function.  - LHC with severe L main with multivessel  proximal LAD and mild RCA .  - In the cath lab PA sat < 40%., CI 1.6.  Placed Impella 2.5 placed and started on milrinone  - Felt too high risk for CABG.  - 9/30 PCI to RCA with DES x 3, deferred intervention on left main with difficulty wiring the LAD and non-flow limiting dissection.  Can consider repeat PCI attempt (versus off-pump LIMA) down the road as she improves - Continue ASA 81, ticagrelor, atorvastatin. - No chest pain.    2.  Acute systolic HF ->  Cardiogenic shock:  - Ischemic cardiomyopathy.  Echo with EF 25-30%, severely decreased RV function, moderate-severe TR, moderate MR. Required MCS w/ Impella. Impella removed 10/3.  - Loop diuretics on hold due to orthostasis and low volume. Volume status ok  - Continue digoxin 0.125 mcg , dig level 0.5   - Avoiding SGLT2i due to groin infections.  - No beta blocker d/t recent shock - Off losartan and spiro d/t orthostatic dizziness. Much improved    3. Shock Liver  -Resolved     4. Anemia  - Hgb dropped to 6.5 initially>> given 2UPRBCS with appropriate rise to 8.9.  - 1U RBCs given 9/29.   - Significant hematoma left groin post-intervention, had 2 units PRBCs 9/30 with appropriate increase in hgb. IABP removed 10/3. Site remains stable  - stable at Hgb 9.6 on recent labs   5. Thrombocytopenia due to Impella - resolved   6. H/O AKI:  - resolved - Renal function normalized.   7. Orthostatic hypotension: - In setting of poor PO intake, now improving  - Continue midodrine 5 mg bid - Continue to hold spiro and losartan  - Encouraged more fluids, TED hose + abdominal binder.   8. Debility - CIR appreciated.    Length of Stay: [address] [place]  01/19/2021, 9:34 AM  Advanced Heart Failure Team Pager [phone]  (M-F; 7a - 5p)  Please contact [facility] for night-coverage after hours (5p -7a ) and weekends on [email or web address]  Patient seen and examined with the above-signed Advanced Practice Provider and/or Housestaff. I personally reviewed laboratory data, imaging studies and relevant notes. I independently examined the patient and formulated the important aspects of the plan. I have edited the note to reflect any of my changes or salient points. I have personally discussed the plan with the patient and/or family.  Dizziness much improved. Able to work with PT. Not orthostatic. No CP, edema. Labs stable   General:  Elderly. No resp difficulty HEENT: normal Neck: supple. no JVD. Carotids 2+ bilat; no bruits. No lymphadenopathy or thryomegaly appreciated. Cor: PMI nondisplaced. Regular rate & rhythm. No rubs, gallops or murmurs. Lungs:  clear Abdomen: soft, nontender, nondistended. No hepatosplenomegaly. No bruits or masses. Good bowel sounds. Extremities: no cyanosis, clubbing, rash, edema Neuro: alert & orientedx3, cranial nerves grossly intact. moves all 4 extremities w/o difficulty. Affect pleasant  Improving with rehab. No angina or HF. GDMT limited due to low BP. Continue current regimen. Continue midodrine for BP support.   Arvilla Meres, MD  1:44 PM

## 2021-01-19 NOTE — Progress Notes (Signed)
Occupational Therapy Session Note  Patient Details  Name: Jane Navarro MRN: [id number] Date of Birth: 27-Jul-1945  Today's Date: 01/19/2021 OT Individual Time: 1859-0931 OT Individual Time Calculation (min): 27 min    Short Term Goals: Week 1:  OT Short Term Goal 1 (Week 1): Pt will be able to complete toilet transfers with min A. OT Short Term Goal 2 (Week 1): Pt will be able to complete 3/3 toileting tasks with min A. OT Short Term Goal 3 (Week 1): Pt will don pants with min A by being able to pull pants over hips herself. OT Short Term Goal 4 (Week 1): Pt will demonstrate improved endurance by tolerating standing at sink to brush her teeth.  Skilled Therapeutic Interventions/Progress Updates:    Pt received semi-reclined in bed, no c/o pain, agreeable to therapy. Session focus on self-care retraining, activity tolerance, functional mobility in prep for improved ADL/IADL/func mobility performance + decreased caregiver burden. Requesting to use bathroom. Total A to don/doff B gripper socks at bed level.  In supine, BP read at 138/69 (82). Came to sitting EOB with close S and increased time for initiation. BP then read at 122/63 (82). Reports light headedness that improves when coached through PLB.   Ambulated > toilet > sink with RW and CGA throughout. Required seated rest break on toilet prior to completing LB clothing management due to fatigue. CGA for toileting tasks post continent void of urine. Washed hands at sink in standing with CGA and then completed ambulatory transfer back to bed same manner as before. Pt pleased with room level ambulation. Returned to supine with S and pt able to boost herself up in bed with use of bed features.   Pt left semi-reclined in bed with BLE elevated with bed alarm engaged, call bell in reach, and all immediate needs met.    Therapy Documentation Precautions:  Precautions Precautions: Fall Required Braces or Orthoses: Other Brace Other Brace: pt  wears neoprene type sleeve on B knees due to arthritic pain Restrictions Weight Bearing Restrictions: No RLE Weight Bearing: Non weight bearing LLE Weight Bearing: Non weight bearing  Pain: no c/o   ADL: See Care Tool for more details.  Therapy/Group: Individual Therapy  Volanda Napoleon MS, OTR/L  01/19/2021, 6:47 AM

## 2021-01-19 NOTE — Progress Notes (Signed)
Patient ID: Jane Navarro, female   DOB: 08/16/1945, 75 y.o.   MRN: [id number]  Met with pt and son who is in her room to discuss team conference progress this week. Daughter was also on the phone to update her. Discussed pt is doing much better medically and can participate more in therapies this week. She will still need supervision for tolieting and bathing and dressing. She will be mod/I with mobility. Therapy team to upgrade goals. Have extended her stay to 10/27, due to loss therapy days with medical issues. Have scheduled family training for 10/26 @ 1:00-3:00 pm with daughter prior to discharge home. Will work on home health and she has equipment from past admissions. 

## 2021-01-19 NOTE — Plan of Care (Signed)
  Problem: Sit to Stand Goal: LTG:  Patient will perform sit to stand with assistance level (PT) Description: LTG:  Patient will perform sit to stand with assistance level (PT) Flowsheets (Taken 01/19/2021 1254) LTG: PT will perform sit to stand in preparation for functional mobility with assistance level: (LRAD) Independent with assistive device Note: Upgraded due to current performance in CIR    Problem: RH Ambulation Goal: LTG Patient will ambulate in home environment (PT) Description: LTG: Patient will ambulate in home environment, # of feet with assistance (PT). Flowsheets Taken 01/19/2021 1254 LTG: Pt will ambulate in home environ  assist needed:: (LRAD) Independent with assistive device Taken 01/12/2021 1607 LTG: Ambulation distance in home environment: 50' Note: Upgraded due to current performance in CIR

## 2021-01-20 DIAGNOSIS — E876 Hypokalemia: Secondary | ICD-10-CM

## 2021-01-20 DIAGNOSIS — E871 Hypo-osmolality and hyponatremia: Secondary | ICD-10-CM

## 2021-01-20 LAB — BASIC METABOLIC PANEL
Anion gap: 4 — ABNORMAL LOW (ref 5–15)
BUN: 13 mg/dL (ref 8–23)
CO2: 25 mmol/L (ref 22–32)
Calcium: 8.2 mg/dL — ABNORMAL LOW (ref 8.9–10.3)
Chloride: 103 mmol/L (ref 98–111)
Creatinine, Ser: 0.81 mg/dL (ref 0.44–1.00)
GFR, Estimated: >60 mL/min (ref >60)
Glucose, Bld: 84 mg/dL (ref 70–99)
Potassium: 3.3 mmol/L — ABNORMAL LOW (ref 3.5–5.1)
Sodium: 132 mmol/L — ABNORMAL LOW (ref 135–145)

## 2021-01-20 MED ORDER — COLLAGENASE 250 UNIT/GM EX OINT
TOPICAL_OINTMENT | Freq: Every day | CUTANEOUS | Status: DC
  Filled 2021-01-20: qty 30

## 2021-01-20 MED ORDER — POTASSIUM CHLORIDE CRYS ER 20 MEQ PO TBCR
30.0000 meq | EXTENDED_RELEASE_TABLET | Freq: Two times a day (BID) | ORAL | Status: DC
  Administered 2021-01-20 – 2021-01-21 (×3): 30 meq via ORAL
  Filled 2021-01-20 (×3): qty 1

## 2021-01-20 MED ORDER — POTASSIUM CHLORIDE CRYS ER 20 MEQ PO TBCR
40.0000 meq | EXTENDED_RELEASE_TABLET | ORAL | Status: DC
Start: 2021-01-20 — End: 2021-01-20
  Administered 2021-01-20: 40 meq via ORAL
  Filled 2021-01-20: qty 2

## 2021-01-20 NOTE — Progress Notes (Signed)
Physical Therapy Session Note  Patient Details  Name: Jane Navarro MRN: [id number] Date of Birth: [birth year]  Today's Date: 01/20/2021 PT Individual Time: 0845-1000 PT Individual Time Calculation (min): 75 min   Short Term Goals: Week 2:  PT Short Term Goal 1 (Week 2): STG = LTG due to LOS  Skilled Therapeutic Interventions/Progress Updates:    Pt received seated in bed, agreeable to PT session. No complaints of pain this date. Pt requesting a new RW as her current one "squeaks". Provided plastic balls for RW for decreased noise with mobility. Semi-reclined BP 143/81. Supine to sit with Supervision. Pt reports onset of dizziness in sitting. Seated BP 130/82. Assisted pt with donning knee high TEDs due to drop in BP with position change and pt symptomatic. Sit to stand with Supervision to RW throughout session. Ambulatory transfer to toilet with RW and Supervision. Toilet transfer with Supervision. Pt able to doff brief independently and perform pericare with setup A. Pt requires assist to don new brief. Pt requesting paper scrub pants and declines to don her own pants. Obtained paper scrub pants for patient who then declines to don pants and requests to just wear her nightgown. Ambulation x 58 ft, x 75 ft, x 20 ft, x 113 ft with RW and Supervision. Pt takes several seated rest breaks due to fatigue and onset of dizziness. Seated BP 136/78 following gait and symptoms of lightheadedness improve with seated rest break. Pt reports she is going to "breathe through it". Pt returned to bed at end of session, Supervision for bed mobility. Pt left supine in bed with needs in reach, bed alarm in place at end of session.  Therapy Documentation Precautions:  Precautions Precautions: Fall Required Braces or Orthoses: Other Brace Other Brace: pt wears neoprene type sleeve on B knees due to arthritic pain Restrictions Weight Bearing Restrictions: No RLE Weight Bearing: Non weight bearing LLE Weight Bearing:  Non weight bearing     Therapy/Group: Individual Therapy   Peter Congo, PT, DPT, CSRS  01/20/2021, 12:14 PM

## 2021-01-20 NOTE — Progress Notes (Addendum)
Advanced Heart Failure Rounding Note  PCP-Cardiologist: Parke Poisson, MD  HF: Dr. Gala Romney  Subjective:   01/11/21 Moved to CIR  10/12: dizzy and orthostatic. Midodrine added. Abdominal binder added.   Making progress but needs more rehab. Therapy extended to 10/27.   Felt dizzy w/ PT earlier today w/ standing but quickly improved. Midodrine just increased from bid to TID.   Otherwise feels well. No dyspnea nor CP w/ PT. Appetite improving.   Wt stable off diuretics. SCr normal. K low 3.3 today.    Objective:   Weight Range: 61.9 kg Body mass index is 26.65 kg/m.   Vital Signs:   Temp:  [98.7 F (37.1 C)-99.3 F (37.4 C)] 98.9 F (37.2 C) (10/19 0428) Pulse Rate:  [59-66] 66 (10/19 0834) Resp:  [18-20] 18 (10/19 0428) BP: (123-144)/(68-82) 123/68 (10/19 0429) SpO2:  [95 %-97 %] 95 % (10/19 0429) Weight:  [61.9 kg] 61.9 kg (10/19 0500) Last BM Date: 01/18/21  Weight change: Filed Weights   01/18/21 0452 01/19/21 0448 01/20/21 0500  Weight: 59.5 kg 61.8 kg 61.9 kg    Intake/Output:   Intake/Output Summary (Last 24 hours) at 01/20/2021 1106 Last data filed at 01/20/2021 0749 Gross per 24 hour  Intake 600 ml  Output --  Net 600 ml      Physical Exam    General:  Well appearing. No respiratory difficulty HEENT: normal Neck: supple. no JVD. Carotids 2+ bilat; no bruits. No lymphadenopathy or thyromegaly appreciated. Cor: PMI nondisplaced. Regular rate & rhythm. No rubs, gallops or murmurs. Lungs: clear Abdomen: soft, nontender, nondistended. No hepatosplenomegaly. No bruits or masses. Good bowel sounds. Extremities: no cyanosis, clubbing, rash, edema + TED hoses  Neuro: alert & oriented x 3, cranial nerves grossly intact. moves all 4 extremities w/o difficulty. Affect pleasant. .   Labs    CBC No results for input(s): WBC, NEUTROABS, HGB, HCT, MCV, PLT in the last 72 hours.  Basic Metabolic Panel Recent Labs    51/70/01 0505  01/20/21 0545  NA 139 132*  K 3.7 3.3*  CL 107 103  CO2 26 25  GLUCOSE 85 84  BUN 13 13  CREATININE 0.73 0.81  CALCIUM 8.6* 8.2*   Liver Function Tests No results for input(s): AST, ALT, ALKPHOS, BILITOT, PROT, ALBUMIN in the last 72 hours.  No results for input(s): LIPASE, AMYLASE in the last 72 hours. Cardiac Enzymes No results for input(s): CKTOTAL, CKMB, CKMBINDEX, TROPONINI in the last 72 hours.  BNP: BNP (last 3 results) Recent Labs    12/29/20 0014  BNP >4,500.0*    ProBNP (last 3 results) No results for input(s): PROBNP in the last 8760 hours.   D-Dimer No results for input(s): DDIMER in the last 72 hours. Hemoglobin A1C No results for input(s): HGBA1C in the last 72 hours. Fasting Lipid Panel No results for input(s): CHOL, HDL, LDLCALC, TRIG, CHOLHDL, LDLDIRECT in the last 72 hours. Thyroid Function Tests No results for input(s): TSH, T4TOTAL, T3FREE, THYROIDAB in the last 72 hours.  Invalid input(s): FREET3   Other results:   Imaging    No results found.   Medications:     Scheduled Medications:  aspirin EC  81 mg Oral Daily   atorvastatin  80 mg Oral Daily   digoxin  0.0625 mg Oral Daily   docusate sodium  200 mg Oral Daily   enoxaparin (LOVENOX) injection  40 mg Subcutaneous Q24H   ezetimibe  10 mg Oral Daily   feeding  supplement  237 mL Oral BID BM   levothyroxine  100 mcg Oral Q0600   magnesium hydroxide  30 mL Oral Daily   midodrine  5 mg Oral TID WC   potassium chloride  40 mEq Oral Q4H   senna  2 tablet Oral BID   sodium chloride flush  10-40 mL Intracatheter Q12H   thiamine  100 mg Oral Daily   ticagrelor  90 mg Oral BID    Infusions:   PRN Medications: acetaminophen, alum & mag hydroxide-simeth, bisacodyl, diphenhydrAMINE, guaiFENesin-dextromethorphan, lip balm, melatonin, oxyCODONE-acetaminophen, polyethylene glycol, prochlorperazine **OR** prochlorperazine **OR** prochlorperazine, sodium chloride, sodium chloride flush,  sodium phosphate    Patient Profile  Admitted 9/27 with NSTEMI complicated by cardiogenic shock.   Assessment/Plan  1. 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 ECHO EF 25-30% and severely reduced RV function.  - LHC with severe L main with multivessel  proximal LAD and mild RCA .  - In the cath lab PA sat < 40%., CI 1.6.  Placed Impella 2.5 placed and started on milrinone  - Felt too high risk for CABG.  - 9/30 PCI to RCA with DES x 3, deferred intervention on left main with difficulty wiring the LAD and non-flow limiting dissection.  Can consider repeat PCI attempt (versus off-pump LIMA) down the road as she improves - Continue ASA 81, ticagrelor, atorvastatin. - No chest pain.    2.  Acute systolic HF ->  Cardiogenic shock:  - Ischemic cardiomyopathy.  Echo with EF 25-30%, severely decreased RV function, moderate-severe TR, moderate MR. Required MCS w/ Impella. Impella removed 10/3.  - Loop diuretics on hold due to orthostasis and low volume. Volume status ok  - Continue digoxin 0.125 mcg , dig level 0.5   - Avoiding SGLT2i due to groin infections.  - No beta blocker d/t recent shock - Off losartan and spiro d/t orthostatic dizziness. Much improved    3. Shock Liver  -Resolved     4. Anemia  - Hgb dropped to 6.5 initially>> given 2UPRBCS with appropriate rise to 8.9.  - 1U RBCs given 9/29.   - Significant hematoma left groin post-intervention, had 2 units PRBCs 9/30 with appropriate increase in hgb. IABP removed 10/3. Site remains stable  - stable at Hgb 9.6 on recent labs   5. Thrombocytopenia due to Impella - resolved   6. H/O AKI:  - resolved - Renal function normalized.   7. Orthostatic hypotension: - In setting of poor PO intake, now improving  - Continue midodrine, agree w/ increase to 5 mg tid  - Continue to hold spiro and losartan  - Encouraged more fluids, TED hose + abdominal binder.   8. Debility -making progress, therapy extended to  10/27  - CIR appreciated.   9. Hypokalemia - K 3.3.  - Supp w/ KCl 40 x 1 - BMP in am    Length of Stay: [address] PA-C  01/20/2021, 11:06 AM  Advanced Heart Failure Team Pager [phone] (M-F; 7a - 5p)  Please contact [facility] for night-coverage after hours (5p -7a ) and weekends on [email or web address]  Patient seen and examined with the above-signed Advanced Practice Provider and/or Housestaff. I personally reviewed laboratory data, imaging studies and relevant notes. I independently examined the patient and formulated the important aspects of the plan. I have edited the note to reflect any of my changes or salient points. I have personally discussed the plan with the patient and/or family.  Feeling better. Walking  farther. However BP remains labile.   Was able to work with rehab this am without problem but afternoon session limited with low BP. Midodrine increased to tid  General:  Sitting up in bed. No resp difficulty HEENT: normal Neck: supple. no JVD. Carotids 2+ bilat; no bruits. No lymphadenopathy or thryomegaly appreciated. Cor: PMI nondisplaced. Regular rate & rhythm. No rubs, gallops or murmurs. Lungs: clear Abdomen: soft, nontender, nondistended. No hepatosplenomegaly. No bruits or masses. Good bowel sounds. Extremities: no cyanosis, clubbing, rash, edema Neuro: alert & orientedx3, cranial nerves grossly intact. moves all 4 extremities w/o difficulty. Affect pleasant  Improving slowly. No CP or SOB. But continues to struggle with labile BP. Agree with increasing midodrine. Continue compression hose.   Arvilla Meres, MD  5:26 PM

## 2021-01-20 NOTE — Progress Notes (Signed)
Occupational Therapy Session Note  Patient Details  Name: Jane Navarro MRN: [id number] Date of Birth: June 08, 1945  Today's Date: 01/20/2021 OT Individual Time: 1300-1408 OT Individual Time Calculation (min): 68 min    Short Term Goals: Week 2:  OT Short Term Goal 1 (Week 2): Pt will be able to tolerate standing for 3 minutes to be able to brush teeth at sink. OT Short Term Goal 2 (Week 2): Pt will complete 3/3 steps of toileting with supervision. OT Short Term Goal 3 (Week 2): Pt will be able to don underwear and socks over feet with S.  Skilled Therapeutic Interventions/Progress Updates:  Pt greeted supine in bed  agreeable to OT intervention. BP taken from supine 120/73(87) TEDS donned. Pt completed supine>sit with CGA EOB- 98/66 applied ABD binder and had pt completed LAQs ~ 1 min and rechecked BP from sitting- 120/71.  Pt sit<>stand from EOB with CGA with pt reporting dizziness unable to remain standing and needing to lay back down, unable to get BP reading in standing- BP from supine 149/78.  Pt reported urgency to void bladder but reports unable to ambulate into bathroom, stand pivot transfer from EOB>BSC with Rw and CGA, +bladder void. MOD A for 3/3 toileting tasks with pt completing anterior pericare with set- up assist from sitting, but assist needed for clothing mgmt in standing. CGA for stand pivot transfer from BSC>EOB with Rw. Pt requested to don new brief but needed supine rest break prior ~ 3 mins. Pt completed supine>sit with supervision where pt needed MAX A to don brief from EOB, unable to reach down to thread BLEs d/t feeling lightheaded, CGA to stand while OTA pulled brief up to waist line, pt reports lightheadedness needing to return to supine BP from supine 139/80. Pt reports fatigue reporting that "the afternoons just aren't my best." pt left supine in bed with bed alarm activated and all needs within reach, BP 138/71 from supine.                        Therapy  Documentation Precautions:  Precautions Precautions: Fall Required Braces or Orthoses: Other Brace Other Brace: pt wears neoprene type sleeve on B knees due to arthritic pain Restrictions Weight Bearing Restrictions: No RLE Weight Bearing: Non weight bearing LLE Weight Bearing: Non weight bearing  Pain: pt reports no pain during session     Therapy/Group: Individual Therapy  Barron Schmid 01/20/2021, 2:10 PM

## 2021-01-20 NOTE — Progress Notes (Signed)
PROGRESS NOTE   Subjective/Complaints: Patient seen laying in bed this morning.  She states he slept well overnight.  She denies complaints.  Discussed with nursing.  Patient feels the wound is getting better.  ROS: Denies CP, SOB, N/V/D  Objective:   No results found. No results for input(s): WBC, HGB, HCT, PLT in the last 72 hours.  Recent Labs    01/19/21 0505 01/20/21 0545  NA 139 132*  K 3.7 3.3*  CL 107 103  CO2 26 25  GLUCOSE 85 84  BUN 13 13  CREATININE 0.73 0.81  CALCIUM 8.6* 8.2*     Intake/Output Summary (Last 24 hours) at 01/20/2021 1203 Last data filed at 01/20/2021 0749 Gross per 24 hour  Intake 600 ml  Output --  Net 600 ml         Physical Exam: Vital Signs Blood pressure 123/68, pulse 66, temperature 98.9 F (37.2 C), temperature source Oral, resp. rate 18, height 5' (1.524 m), weight 61.9 kg, SpO2 95 %. Constitutional: No distress . Vital signs reviewed. HENT: Normocephalic.  Atraumatic. Eyes: EOMI. No discharge. Cardiovascular: No JVD.  RRR. Respiratory: Normal effort.  No stridor.  Bilateral clear to auscultation. GI: Non-distended.  BS +. Skin: Warm and dry.  Right groin small incision with some slough. Psych: Normal mood.  Normal behavior. Musc: No edema in extremities.  No tenderness in extremities. Neuro: Alert and oriented Motor: 4/5 throughout  Assessment/Plan: 1. Functional deficits which require 3+ hours per day of interdisciplinary therapy in a comprehensive inpatient rehab setting. Physiatrist is providing close team supervision and 24 hour management of active medical problems listed below. Physiatrist and rehab team continue to assess barriers to discharge/monitor patient progress toward functional and medical goals  Care Tool:  Bathing    Body parts bathed by patient: Right arm, Left arm, Chest, Abdomen, Right upper leg, Face, Front perineal area, Left upper leg,  Right lower leg, Left lower leg, Buttocks   Body parts bathed by helper: Right lower leg, Left lower leg, Buttocks     Bathing assist Assist Level: Contact Guard/Touching assist     Upper Body Dressing/Undressing Upper body dressing   What is the patient wearing?: Dress Orthosis activity level: Performed by helper  Upper body assist Assist Level: Supervision/Verbal cueing    Lower Body Dressing/Undressing Lower body dressing      What is the patient wearing?: Underwear/pull up     Lower body assist Assist for lower body dressing: Minimal Assistance - Patient > 75%     Toileting Toileting    Toileting assist Assist for toileting: Contact Guard/Touching assist     Transfers Chair/bed transfer  Transfers assist     Chair/bed transfer assist level: Supervision/Verbal cueing     Locomotion Ambulation   Ambulation assist   Ambulation activity did not occur: Safety/medical concerns (Fatigue, syncope)  Assist level: Supervision/Verbal cueing Assistive device: Walker-rolling Max distance: 157'   Walk 10 feet activity   Assist  Walk 10 feet activity did not occur: Safety/medical concerns (Fatigue, syncope)  Assist level: Supervision/Verbal cueing Assistive device: Walker-rolling   Walk 50 feet activity   Assist Walk 50 feet with 2 turns activity  did not occur: Safety/medical concerns (Fatigue, syncope)  Assist level: Supervision/Verbal cueing Assistive device: Walker-rolling    Walk 150 feet activity   Assist Walk 150 feet activity did not occur: Safety/medical concerns (Fatigue, syncope)  Assist level: Supervision/Verbal cueing Assistive device: Walker-rolling    Walk 10 feet on uneven surface  activity   Assist Walk 10 feet on uneven surfaces activity did not occur: Safety/medical concerns (Fatigue, syncope)         Wheelchair     Assist Is the patient using a wheelchair?: Yes Type of Wheelchair: Manual Wheelchair activity did not  occur: Safety/medical concerns (Fatigue, syncope)         Wheelchair 50 feet with 2 turns activity    Assist    Wheelchair 50 feet with 2 turns activity did not occur: Safety/medical concerns (Fatigue, syncope)       Wheelchair 150 feet activity     Assist  Wheelchair 150 feet activity did not occur: Safety/medical concerns (Fatigue, syncope)       Blood pressure 123/68, pulse 66, temperature 98.9 F (37.2 C), temperature source Oral, resp. rate 18, height 5' (1.524 m), weight 61.9 kg, SpO2 95 %.  Medical Problem List and Plan: 1.  Cardiopulmonary debility s/p NSTEMI, CHF Continue CIR  2.  Impaired mobility: -DVT/anticoagulation:  Pharmaceutical: Lovenox             -antiplatelet therapy: Continue ASA/Brillinta 3. Postoperative pain: Decrease Percocet to 1 tab q12H prn   Monitor increased exertion  Controlled on 10/19 4. Mood: LCSW to follow for evaluation and support.              -antipsychotic agents: N/a 5. Neuropsych: This patient is capable of making decisions on her own behalf. 6. Skin/Wound Care: Routine pressure relief measures.   Santyl to wound 7. Fluids/Electrolytes/Nutrition: Monitor I/Os 8. NSTEMI/CAD s/p PCI/stent: Monitor for symptoms with increase in activity.        9. Acute systolic HF: Heart healthy diet. Check daily weights--admit wt 141 lbs. --Monitor for symptoms of overload.  --On spironolactone, Cozaar, Digoxin, ASA/Brillinta and Lipitor  Filed Weights   01/18/21 0452 01/19/21 0448 01/20/21 0500  Weight: 59.5 kg 61.8 kg 61.9 kg   Relatively stable on 10/19 10. Hypothyroid: On supplement for past month.  TSH ordered per cards             --Improving from 41.4-->4.8 in three weeks.  11. AKI: Resolved 12. Shocked liver:   ALT elevated, but improving on 10/11  13. Acute blood loss anemia: Groin hematomas appear resolved.  Hemoglobin 9.6 on 10/16 14. Low albumin levels: Continue Ensure BID--family encouraged to bring low salt foods from  home. 15. Bilateral Knee pain: Voltaren gel added to help manage symptoms.   Controlled with voltaren on 10/19 16. Constipation: Increased senna to bid (does not like daily miralax-->d/c and continue MOM  17. Orthostasis w/syncope:              --change to THT and order abdominal binder.              --encourage oral intake.   Orthostatics negative on 10/13, however patient with symptoms consistent with orthostasis  Increase midodrine 5mg  TID  Appears to be improving 18. Hyponatremia  Na 132 on 10/19  Continue to monitor 19. Hypokalemia  K+ 3.3 on 10/19  Supplemented x 2 days on 10/19   LOS: 9 days A FACE TO FACE EVALUATION WAS PERFORMED  Charnetta Wulff 11/19 01/20/2021, 12:03 PM

## 2021-01-21 ENCOUNTER — Ambulatory Visit: Payer: Medicare HMO | Admitting: Internal Medicine

## 2021-01-21 LAB — BASIC METABOLIC PANEL
Anion gap: 6 (ref 5–15)
BUN: 14 mg/dL (ref 8–23)
CO2: 25 mmol/L (ref 22–32)
Calcium: 8.9 mg/dL (ref 8.9–10.3)
Chloride: 109 mmol/L (ref 98–111)
Creatinine, Ser: 0.75 mg/dL (ref 0.44–1.00)
GFR, Estimated: >60 mL/min (ref >60)
Glucose, Bld: 75 mg/dL (ref 70–99)
Potassium: 4.5 mmol/L (ref 3.5–5.1)
Sodium: 140 mmol/L (ref 135–145)

## 2021-01-21 LAB — MAGNESIUM: Magnesium: 2 mg/dL (ref 1.7–2.4)

## 2021-01-21 MED ORDER — OXYCODONE-ACETAMINOPHEN 5-325 MG PO TABS: 1.0000 | ORAL_TABLET | Freq: Every day | ORAL | Status: DC | PRN

## 2021-01-21 NOTE — Progress Notes (Signed)
PROGRESS NOTE   Subjective/Complaints: Son visiting Eating lunch Doing much better with therapy, less dizzy, feels increased midodrine helped  ROS: Denies CP, SOB, N/V/D, +dizziness  Objective:   No results found. No results for input(s): WBC, HGB, HCT, PLT in the last 72 hours.  Recent Labs    01/20/21 0545 01/21/21 0500  NA 132* 140  K 3.3* 4.5  CL 103 109  CO2 25 25  GLUCOSE 84 75  BUN 13 14  CREATININE 0.81 0.75  CALCIUM 8.2* 8.9    Intake/Output Summary (Last 24 hours) at 01/21/2021 1419 Last data filed at 01/21/2021 0758 Gross per 24 hour  Intake 40 ml  Output --  Net 40 ml        Physical Exam: Vital Signs Blood pressure 139/64, pulse 64, temperature 98.6 F (37 C), temperature source Oral, resp. rate 18, height 5' (1.524 m), weight 59.5 kg, SpO2 97 %. Gen: no distress, normal appearing HEENT: oral mucosa pink and moist, NCAT Cardio: Reg rate Chest: normal effort, normal rate of breathing Abd: soft, non-distended Ext: no edema Psych: pleasant, normal affect Skin: Warm and dry.  Right groin small incision with some slough. Psych: Normal mood.  Normal behavior. Musc: No edema in extremities.  No tenderness in extremities. Neuro: Alert and oriented Motor: 4/5 throughout  Assessment/Plan: 1. Functional deficits which require 3+ hours per day of interdisciplinary therapy in a comprehensive inpatient rehab setting. Physiatrist is providing close team supervision and 24 hour management of active medical problems listed below. Physiatrist and rehab team continue to assess barriers to discharge/monitor patient progress toward functional and medical goals  Care Tool:  Bathing    Body parts bathed by patient: Right arm, Left arm, Chest, Abdomen, Right upper leg, Face, Front perineal area, Left upper leg, Right lower leg, Left lower leg, Buttocks   Body parts bathed by helper: Buttocks     Bathing  assist Assist Level: Minimal Assistance - Patient > 75% (Posterior peri hygiene standing with grab bars)     Upper Body Dressing/Undressing Upper body dressing   What is the patient wearing?: Dress Orthosis activity level: Performed by helper  Upper body assist Assist Level: Set up assist    Lower Body Dressing/Undressing Lower body dressing      What is the patient wearing?: Underwear/pull up     Lower body assist Assist for lower body dressing: Minimal Assistance - Patient > 75%     Toileting Toileting    Toileting assist Assist for toileting: Moderate Assistance - Patient 50 - 74%     Transfers Chair/bed transfer  Transfers assist     Chair/bed transfer assist level: Contact Guard/Touching assist     Locomotion Ambulation   Ambulation assist   Ambulation activity did not occur: Safety/medical concerns (Fatigue, syncope)  Assist level: Supervision/Verbal cueing Assistive device: Walker-rolling Max distance: 113'   Walk 10 feet activity   Assist  Walk 10 feet activity did not occur: Safety/medical concerns (Fatigue, syncope)  Assist level: Supervision/Verbal cueing Assistive device: Walker-rolling   Walk 50 feet activity   Assist Walk 50 feet with 2 turns activity did not occur: Safety/medical concerns (Fatigue, syncope)  Assist level:  Supervision/Verbal cueing Assistive device: Walker-rolling    Walk 150 feet activity   Assist Walk 150 feet activity did not occur: Safety/medical concerns (Fatigue, syncope)  Assist level: Supervision/Verbal cueing Assistive device: Walker-rolling    Walk 10 feet on uneven surface  activity   Assist Walk 10 feet on uneven surfaces activity did not occur: Safety/medical concerns (Fatigue, syncope)         Wheelchair     Assist Is the patient using a wheelchair?: Yes Type of Wheelchair: Manual Wheelchair activity did not occur: Safety/medical concerns (Fatigue, syncope)         Wheelchair  50 feet with 2 turns activity    Assist    Wheelchair 50 feet with 2 turns activity did not occur: Safety/medical concerns (Fatigue, syncope)       Wheelchair 150 feet activity     Assist  Wheelchair 150 feet activity did not occur: Safety/medical concerns (Fatigue, syncope)       Blood pressure 139/64, pulse 64, temperature 98.6 F (37 C), temperature source Oral, resp. rate 18, height 5' (1.524 m), weight 59.5 kg, SpO2 97 %.  Medical Problem List and Plan: 1.  Cardiopulmonary debility s/p NSTEMI, CHF Continue CIR  2.  Impaired mobility: -DVT/anticoagulation:  Pharmaceutical: Lovenox             -antiplatelet therapy: Continue ASA/Brillinta 3. Postoperative pain: Decrease Percocet to 1 tab daily prn   Monitor increased exertion  Controlled on 10/20 4. Mood: LCSW to follow for evaluation and support.              -antipsychotic agents: N/a 5. Neuropsych: This patient is capable of making decisions on her own behalf. 6. Skin/Wound Care: Routine pressure relief measures.   Santyl to wound 7. Fluids/Electrolytes/Nutrition: Monitor I/Os 8. NSTEMI/CAD s/p PCI/stent: Monitor for symptoms with increase in activity.        9. Acute systolic HF: Heart healthy diet. Check daily weights--admit wt 141 lbs. --Monitor for symptoms of overload.  --Holding spironolactone and Cozaar given hypotension, Continue Digoxin, ASA/Brillinta and Lipitor  Filed Weights   01/19/21 0448 01/20/21 0500 01/21/21 0500  Weight: 61.8 kg 61.9 kg 59.5 kg   Relatively stable on 10/20 10. Hypothyroid: On supplement for past month.  TSH ordered per cards             --Improving from 41.4-->4.8 in three weeks.  11. AKI: Resolved 12. Shocked liver:   ALT elevated, but improving on 10/11  13. Acute blood loss anemia: Groin hematomas appear resolved.  Hemoglobin 9.6 on 10/16 14. Low albumin levels: Continue Ensure BID--family encouraged to bring low salt foods from home. 15. Bilateral Knee pain: Voltaren  gel added to help manage symptoms.   Controlled with voltaren on 10/19 16. Constipation: Increased senna to bid (does not like daily miralax-->d/c and continue MOM  17. Orthostasis w/syncope:              --change to THT and order abdominal binder.              --encourage oral intake.   Orthostatics negative on 10/13, however patient with symptoms consistent with orthostasis  Increase midodrine 5mg  TID  Appears to be improving 18. Hyponatremia  Na 132 on 10/19  Continue to monitor 19. Hypokalemia  K+ 3.3 on 10/19  Supplemented x 2 days on 10/19   LOS: 10 days A FACE TO FACE EVALUATION WAS PERFORMED  11/19 P Jane Navarro 01/21/2021, 2:19 PM

## 2021-01-21 NOTE — Progress Notes (Addendum)
Advanced Heart Failure Rounding Note  PCP-Cardiologist: Parke Poisson, MD  HF: Dr. Gala Romney  Subjective:   01/11/21 Moved to CIR  10/12: dizzy and orthostatic. Midodrine added. Abdominal binder added.   Yesterday midodrine increased to three times a day. SBP improved.   Denies dizziness. Appetite improving.   Objective:   Weight Range: 59.5 kg Body mass index is 25.62 kg/m.   Vital Signs:   Temp:  [97.7 F (36.5 C)-98.8 F (37.1 C)] 98.8 F (37.1 C) (10/19 1840) Pulse Rate:  [56-66] 60 (10/20 0619) Resp:  [15-18] 15 (10/19 1840) BP: (142-156)/(73-74) 146/74 (10/20 0619) SpO2:  [96 %-97 %] 97 % (10/20 0619) Weight:  [59.5 kg] 59.5 kg (10/20 0500) Last BM Date: 01/20/21  Weight change: Filed Weights   01/19/21 0448 01/20/21 0500 01/21/21 0500  Weight: 61.8 kg 61.9 kg 59.5 kg    Intake/Output:   Intake/Output Summary (Last 24 hours) at 01/21/2021 0814 Last data filed at 01/21/2021 0758 Gross per 24 hour  Intake 40 ml  Output --  Net 40 ml      Physical Exam    General:  Well appearing. No resp difficulty HEENT: normal Neck: supple. no JVD. Carotids 2+ bilat; no bruits. No lymphadenopathy or thryomegaly appreciated. Cor: PMI nondisplaced. Regular rate & rhythm. No rubs, gallops or murmurs. Lungs: clear Abdomen: soft, nontender, nondistended. No hepatosplenomegaly. No bruits or masses. Good bowel sounds. Extremities: no cyanosis, clubbing, rash, edema Neuro: alert & orientedx3, cranial nerves grossly intact. moves all 4 extremities w/o difficulty. Affect pleasant  .   Labs    CBC No results for input(s): WBC, NEUTROABS, HGB, HCT, MCV, PLT in the last 72 hours.  Basic Metabolic Panel Recent Labs    60/10/93 0545 01/21/21 0500  NA 132* 140  K 3.3* 4.5  CL 103 109  CO2 25 25  GLUCOSE 84 75  BUN 13 14  CREATININE 0.81 0.75  CALCIUM 8.2* 8.9  MG  --  2.0   Liver Function Tests No results for input(s): AST, ALT, ALKPHOS, BILITOT,  PROT, ALBUMIN in the last 72 hours.  No results for input(s): LIPASE, AMYLASE in the last 72 hours. Cardiac Enzymes No results for input(s): CKTOTAL, CKMB, CKMBINDEX, TROPONINI in the last 72 hours.  BNP: BNP (last 3 results) Recent Labs    12/29/20 0014  BNP >4,500.0*    ProBNP (last 3 results) No results for input(s): PROBNP in the last 8760 hours.   D-Dimer No results for input(s): DDIMER in the last 72 hours. Hemoglobin A1C No results for input(s): HGBA1C in the last 72 hours. Fasting Lipid Panel No results for input(s): CHOL, HDL, LDLCALC, TRIG, CHOLHDL, LDLDIRECT in the last 72 hours. Thyroid Function Tests No results for input(s): TSH, T4TOTAL, T3FREE, THYROIDAB in the last 72 hours.  Invalid input(s): FREET3   Other results:   Imaging    No results found.   Medications:     Scheduled Medications:  aspirin EC  81 mg Oral Daily   atorvastatin  80 mg Oral Daily   collagenase   Topical Daily   digoxin  0.0625 mg Oral Daily   docusate sodium  200 mg Oral Daily   enoxaparin (LOVENOX) injection  40 mg Subcutaneous Q24H   ezetimibe  10 mg Oral Daily   feeding supplement  237 mL Oral BID BM   levothyroxine  100 mcg Oral Q0600   magnesium hydroxide  30 mL Oral Daily   midodrine  5 mg Oral TID  WC   potassium chloride  30 mEq Oral BID   senna  2 tablet Oral BID   sodium chloride flush  10-40 mL Intracatheter Q12H   thiamine  100 mg Oral Daily   ticagrelor  90 mg Oral BID    Infusions:   PRN Medications: acetaminophen, alum & mag hydroxide-simeth, bisacodyl, diphenhydrAMINE, guaiFENesin-dextromethorphan, lip balm, melatonin, oxyCODONE-acetaminophen, polyethylene glycol, prochlorperazine **OR** prochlorperazine **OR** prochlorperazine, sodium chloride, sodium chloride flush, sodium phosphate    Patient Profile  Admitted 9/27 with NSTEMI complicated by cardiogenic shock.   Assessment/Plan  1. 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 ECHO EF 25-30% and severely reduced RV function.  - LHC with severe L main with multivessel  proximal LAD and mild RCA .  - In the cath lab PA sat < 40%., CI 1.6.  Placed Impella 2.5 placed and started on milrinone  - Felt too high risk for CABG.  - 9/30 PCI to RCA with DES x 3, deferred intervention on left main with difficulty wiring the LAD and non-flow limiting dissection.  Can consider repeat PCI attempt (versus off-pump LIMA) down the road as she improves - Continue ASA 81, ticagrelor, atorvastatin. - No chest pain.    2.  Acute systolic HF ->  Cardiogenic shock:  - Ischemic cardiomyopathy.  Echo with EF 25-30%, severely decreased RV function, moderate-severe TR, moderate MR. Required MCS w/ Impella. Impella removed 10/3.  - Loop diuretics on hold due to orthostasis and low volume. Volume status ok  - Continue digoxin 0.125 mcg , dig level 0.5   - Avoiding SGLT2i due to groin infections.  - No beta blocker d/t recent shock - Off losartan and spiro d/t orthostatic dizziness. - BP much improved.  - Continue ted hose when oob.    3. Shock Liver  -Resolved     4. Anemia  - Hgb dropped to 6.5 initially>> given 2UPRBCS with appropriate rise to 8.9.  - 1U RBCs given 9/29.   - Significant hematoma left groin post-intervention, had 2 units PRBCs 9/30 with appropriate increase in hgb. IABP removed 10/3. Site remains stable  - stable at Hgb 9.6 on recent labs   5. Thrombocytopenia due to Impella - resolved   6. H/O AKI:  - resolved - Renal function normalized.   7. Orthostatic hypotension: - In setting of poor PO intake, now improving  - Continue midodrine 5 mg tid . SBP improved today. Needs orthostatics today.  - Continue to hold spiro and losartan  -Continue ted hose when oob.   8. Debility -making progress, therapy extended to 10/27  - CIR appreciated.   9. Hypokalemia - K 4. 5  Length of Stay: 10  Tonye Becket, NP  01/21/2021, 8:14 AM  Advanced Heart  Failure Team Pager [phone] (M-F; 7a - 5p)  Please contact [facility] for night-coverage after hours (5p -7a ) and weekends on [email or web address]   Patient seen and examined with the above-signed Advanced Practice Provider and/or Housestaff. I personally reviewed laboratory data, imaging studies and relevant notes. I independently examined the patient and formulated the important aspects of the plan. I have edited the note to reflect any of my changes or salient points. I have personally discussed the plan with the patient and/or family.  Feels better after increasing midodrine. Able to do more with therapy. Dizziness much improved. No orthopnea or PND. No CP or edema  General: Sitting in chair No resp difficulty HEENT: normal + poor dentition Neck: supple. no JVD.  Carotids 2+ bilat; no bruits. No lymphadenopathy or thryomegaly appreciated. Cor: PMI nondisplaced. Regular rate & rhythm. No rubs, gallops or murmurs. Lungs: clear Abdomen: soft, nontender, nondistended. No hepatosplenomegaly. No bruits or masses. Good bowel sounds. Extremities: no cyanosis, clubbing, rash, edema Neuro: alert & orientedx3, cranial nerves grossly intact. moves all 4 extremities w/o difficulty. Affect pleasant   Improved with increased midodrine. No FH or anginal symptoms. Strength improving. Unfortunately GDMT limited due to BP. Continue DAPT/statin.   Arvilla Meres, MD  2:35 PM

## 2021-01-21 NOTE — Progress Notes (Signed)
Occupational Therapy Session Note  Patient Details  Name: Jane Navarro MRN: [id number] Date of Birth: 1945-08-14  Today's Date: 01/21/2021 OT Individual Time: 1002-1059 OT Individual Time Calculation (min): 57 min    Short Term Goals: Week 2:  OT Short Term Goal 1 (Week 2): Pt will be able to tolerate standing for 3 minutes to be able to brush teeth at sink. OT Short Term Goal 2 (Week 2): Pt will complete 3/3 steps of toileting with supervision. OT Short Term Goal 3 (Week 2): Pt will be able to don underwear and socks over feet with S.  Skilled Therapeutic Interventions/Progress Updates:  Pt greeted seated in w/c  agreeable to OT intervention. Session focus on BADL reeducation, energy conservation education and reinforcement, and fall prevention/safety in preparation for increased independence, safety, and endurance in ADLs at d/c. Pt completed stand pivot transfer from w/c<>TTB in walk-in shower with CGA with RW. Pt required verbal cues for hand placement and technique. Pt doffed UB clothes from shower seat with supervision and needed total A to doff teds and socks. Pt completed bathing with min A-CGA and min vc's to complete posterior pericare in stance with grab bars; pt demo'd difficulties with lateral leaning technique but will continue to practice to increase safety at home. UB dressing set up A; pt declined to don brief. Pt donned socks mod A overall using figure 4 technique, but requiring mod A on LLE only (min A required for RLE). Stand pivot transfer completed w/c>recliner with CGA with RW. Teds donned dependently. Pt reported only slight dizziness at times following stance but improved from previous sessions. Rest breaks provided throughout session but decreased time from previous sessions. Pt remained seated in recliner, alarm set, call bell in reach, and all needs met.                    Therapy Documentation Precautions:  Precautions Precautions: Fall Required Braces or Orthoses:  Other Brace Other Brace: pt wears neoprene type sleeve on B knees due to arthritic pain Restrictions Weight Bearing Restrictions: No RLE Weight Bearing: Non weight bearing LLE Weight Bearing: Non weight bearing  Pain: pt reports no pain during session.     Therapy/Group: Individual Therapy  [name] 01/21/2021, 12:55 PM

## 2021-01-21 NOTE — Progress Notes (Signed)
Patient ID: Jane Navarro, female   DOB: 14-Mar-1946, 75 y.o.   MRN: [id number] Spoke with daughter regarding pt's current level and the team's recommendation for 24/7 supervision due to her dizziness and more difficulty in the afternoons than the mornings. Discussed too high level of SNF and with ALF would still be behind a closed door, can assist with bating and dressing along with tolieting. Best option would be private duty CNA to be there while her son works. Also pt would be happiest with this option. Pt and son were present in the room and made aware of the conversation worker had with daughter. Daughter and pt to pursue pt's LTC policy and see if this would cover caregivers. Pt will need ot be present when speaking with them. Pt feels better today and took a shower with OT. Daughter wants to be prepared for discharge and have the care Mom needs. Asking if MD's still adjusting medicines if she would discharge next Thursday. This is up to MD's. Will continue to work on discharge needs.

## 2021-01-21 NOTE — Progress Notes (Signed)
Physical Therapy Session Note  Patient Details  Name: Jane Navarro MRN: [id number] Date of Birth: May 18, 1945  Today's Date: 01/21/2021 PT Individual Time: 0915-0943 PT Individual Time Calculation (min): 28 min   Short Term Goals: Week 2:  PT Short Term Goal 1 (Week 2): STG = LTG due to LOS  Skilled Therapeutic Interventions/Progress Updates:     Pt received supine in bed and agrees to therapy. No complaint of pain. With pt supine, PT dons knee high TED hose. Supine to sit with bed features and verbal cues on sequencing. Pt performs sit to stand and ambulatory transfer to toilet with RW and CGA, with cues for upright gaze to improve posture and balance, increasing stride length to decrease risk for falls, and increasing proximity to AD for safety. Pt is continent of bladder and performs sit to stand from toilet with verbal cues for hand placement. Pt ambulates to sink and stands to wash hands to work on strength and endurance. WC transport to gym for time management. PT demonstrates stair sequencing technique and pt completes x3 6" steps with bilateral hand rails, ascending forward and descending backward. Pt repeatedly says "I'm done" when she gets to third step, and appears to get slightly anxious regarding her fatigued. PT cues for pursed lip breathing to optimize oxygen sats. WC transport back to room. Left seated with alarm intact and all needs within reach.  Therapy Documentation Precautions:  Precautions Precautions: Fall Required Braces or Orthoses: Other Brace Other Brace: pt wears neoprene type sleeve on B knees due to arthritic pain Restrictions Weight Bearing Restrictions: No RLE Weight Bearing: Non weight bearing LLE Weight Bearing: Non weight bearing    Therapy/Group: Individual Therapy  Beau Fanny, PT, DPT 01/21/2021, 3:57 PM

## 2021-01-21 NOTE — Progress Notes (Signed)
Physical Therapy Session Note  Patient Details  Name: Jane Navarro MRN: [id number] Date of Birth: September 18, 1945  Today's Date: 01/21/2021 PT Individual Time: 0488-8916 PT Individual Time Calculation (min): 57 min   Short Term Goals: Week 1:  PT Short Term Goal 1 (Week 1): Pt will ascend/descend 4 6" steps w/BUE support and min A PT Short Term Goal 1 - Progress (Week 1): Progressing toward goal PT Short Term Goal 2 (Week 1): Pt will ambulate 25' w/LRAD and min A PT Short Term Goal 2 - Progress (Week 1): Met PT Short Term Goal 3 (Week 1): Pt will self-propel 50' in [facility] w/CGA PT Short Term Goal 3 - Progress (Week 1): Progressing toward goal Week 2:  PT Short Term Goal 1 (Week 2): STG = LTG due to LOS  Skilled Therapeutic Interventions/Progress Updates:  Pt received seated in recliner in room, reported she had "been sitting up all day and wanted therapist to know". Pt denied pain and was agreeable to PT. Emphasis of session on improved activity tolerance and endurance. Pt requested to void, performed sit <>stands throughout session w/S* and demonstrated proper hand placement without cues. Pt ambulated 15' to [facility] in bathroom, voided continently and had type 7 BM (in flowsheet). Pt performed peri care w/set up assist and donned new brief w/mod A for improved independence and decreased caregiver burden. Pt ambulated 10' to recliner due to feeling lightheaded, BP at 124/72 mmHg. After several minutes of diaphragmatic breathing, pt ambulated 10' to sink w/ S* to perform hand hygiene but became very lightheaded and required therapist to grab [facility] for pt to sit at sink. BP at 117/62 mmHg, pt reported she would "breathe through it". Pt requested to get back into bed, performed sit <>stand pivot from [facility] to bed and sit <> supine w/S*. Pt was left supine in bed, all needs in reach.   Therapy Documentation Precautions:  Precautions Precautions: Fall Required Braces or Orthoses: Other Brace Other Brace: pt wears  neoprene type sleeve on B knees due to arthritic pain Restrictions Weight Bearing Restrictions: No RLE Weight Bearing: Non weight bearing LLE Weight Bearing: Non weight bearing   Therapy/Group: Individual Therapy Cruzita Lederer Sephora Boyar, PT, DPT  01/21/2021, 7:54 AM

## 2021-01-22 LAB — BASIC METABOLIC PANEL
Anion gap: 5 (ref 5–15)
BUN: 12 mg/dL (ref 8–23)
CO2: 24 mmol/L (ref 22–32)
Calcium: 8.7 mg/dL — ABNORMAL LOW (ref 8.9–10.3)
Chloride: 108 mmol/L (ref 98–111)
Creatinine, Ser: 0.63 mg/dL (ref 0.44–1.00)
GFR, Estimated: >60 mL/min (ref >60)
Glucose, Bld: 81 mg/dL (ref 70–99)
Potassium: 4.1 mmol/L (ref 3.5–5.1)
Sodium: 137 mmol/L (ref 135–145)

## 2021-01-22 NOTE — Progress Notes (Signed)
Occupational Therapy Session Note  Patient Details  Name: Jane Navarro MRN: [id number] Date of Birth: 05-12-1945  Today's Date: 01/23/2021 OT Individual Time: 5170-0174 OT Individual Time Calculation (min): 53 min   Skilled Therapeutic Interventions/Progress Updates:    Pt greeted in bed with no c/o pain, opting to defer shower today until her daughter brought in special products for her hair. Pt asked to start session by using the restroom. First donned Ted hose bedlevel for BP support. Supine<sit completed with setup assistance, HOB elevated using the bedrail. Once EOB, donned her abdominal binder and then worked on ADL retraining by donning pants and gripper socks at sit<stand level using RW. Pt able to thread both LEs into pants unassisted though did need Min A for Rt gripper sock and Max A on the Lt side due to osteoarthritic knee. CGA for sit<stand to pull up pants fully and CGA for ambulatory transfer to toilet using device, pt with very slow pace. CGA for toileting tasks with pt having bladder void. Close supervision for ambulation to the sink with vcs for proximity to device. Pt completed hand washing and oral care in standing with supervision for balance before returning to the recliner to sit and rest for a bit. To work on [employer] strengthening/endurance, guided pt through overhead and forward arm circles in both directions, 10 reps 2 sets. Discussed d/c needs, pt reporting she is going home with her son. She already has a TTB and toilet riser, son's bathroom is walker accessible. Pt unsure about threshold of his walk-in shower. Advised pt to ask son to take a picture of shower setup so OT can help her practice a simulated transfer before d/c. Pt agreeable. She remained sitting comfortably in the recliner, all needs within reach and chair alarm set.   Therapy Documentation Precautions:  Precautions Precautions: Fall Required Braces or Orthoses: Other Brace Other Brace: pt wears neoprene type  sleeve on B knees due to arthritic pain Restrictions Weight Bearing Restrictions: No RLE Weight Bearing: Non weight bearing LLE Weight Bearing: Non weight bearing  ADL: ADL Eating: Set up Grooming: Supervision/safety Where Assessed-Grooming: Sitting at sink Upper Body Bathing: Supervision/safety Where Assessed-Upper Body Bathing: Other (Comment) (toilet) Lower Body Bathing: Contact guard Where Assessed-Lower Body Bathing:  (toilet) Upper Body Dressing: Supervision/safety Where Assessed-Upper Body Dressing:  (toilet) Lower Body Dressing: Moderate assistance Where Assessed-Lower Body Dressing:  (toilet) Toileting: Contact guard Where Assessed-Toileting: Teacher, adult education: Close supervision Toilet Transfer Method: Surveyor, minerals: Grab bars  Therapy/Group: Individual Therapy  Norris Bodley A Coleson Kant 01/23/2021, 12:42 PM

## 2021-01-22 NOTE — Progress Notes (Signed)
  Chart reviewed. Patient stable.   I will see again on Monday.   Please page [facility] cardiology over the weekend with any cardiac issues.   Arvilla Meres, MD  11:37 AM

## 2021-01-22 NOTE — Progress Notes (Signed)
Physical Therapy Session Note  Patient Details  Name: Jane Navarro MRN: [id number] Date of Birth: 21-Nov-1945  Today's Date: 01/22/2021 PT Individual Time: 7357-8978 PT Individual Time Calculation (min): 55 min   Short Term Goals: Week 1:  PT Short Term Goal 1 (Week 1): Pt will ascend/descend 4 6" steps w/BUE support and min A PT Short Term Goal 1 - Progress (Week 1): Progressing toward goal PT Short Term Goal 2 (Week 1): Pt will ambulate 25' w/LRAD and min A PT Short Term Goal 2 - Progress (Week 1): Met PT Short Term Goal 3 (Week 1): Pt will self-propel 50' in [facility] w/CGA PT Short Term Goal 3 - Progress (Week 1): Progressing toward goal Week 2:  PT Short Term Goal 1 (Week 2): STG = LTG due to LOS  Skilled Therapeutic Interventions/Progress Updates:  Pt received seated in recliner in room, denied pain and was agreeable to PT. Emphasis of session on improved transfers and activity tolerance. Pt performed sit <>stands throughout session w/S* from recliner, BSC and WC. Pt requested to void and ambulated 12' to bathroom w/RW and S*. Pt voided continently and performed peri care w/set-up assist. Pt ambulated 10' to sink and performed hand hygiene while standing w/S*. Pt transported to ortho gym in [facility] w/total A for time management. Pt performed car transfer w/RW and S*, noted good hand placement and safety awareness throughout, no cues required. Pt transported back to [facility] w/total A for time management and pt ambulated 150' w/RW and S* back to room. Noted decreased step clearance bilaterally, kyphotic posture and improved safety w/use of RW. Pt performed sit <>supine w/S* and was left supine in bed, all needs in reach.   Therapy Documentation Precautions:  Precautions Precautions: Fall Required Braces or Orthoses: Other Brace Other Brace: pt wears neoprene type sleeve on B knees due to arthritic pain Restrictions Weight Bearing Restrictions: No RLE Weight Bearing: Non weight bearing LLE Weight  Bearing: Non weight bearing   Therapy/Group: Individual Therapy Cruzita Lederer Rosmary Dionisio, PT, DPT  01/22/2021, 7:45 AM

## 2021-01-22 NOTE — Progress Notes (Addendum)
Occupational Therapy Session Note  Patient Details  Name: Jane Navarro MRN: [id number] Date of Birth: January 08, 1946  Today's Date: 01/22/2021 OT Individual Time: 0940-1050 OT Individual Time Calculation (min): 70 min    Short Term Goals: Week 2:  OT Short Term Goal 1 (Week 2): Pt will be able to tolerate standing for 3 minutes to be able to brush teeth at sink. OT Short Term Goal 2 (Week 2): Pt will complete 3/3 steps of toileting with supervision. OT Short Term Goal 3 (Week 2): Pt will be able to don underwear and socks over feet with S.  Skilled Therapeutic Interventions/Progress Updates:    Pt received in bed stating she got a good shower yesterday so she did not need to bathe today. From bed level pt applied lotion to legs then therapist donned TEDs and socks.  Pt sat to EOB with S, stood with S to RW and ambulated to sink to brush teeth. She stood to complete oral care but then needed to sit and rest. After a long rest, pt ambulated to toilet.  She is S with her transfers and clothing management and cleansing. Pt needs numerous rest breaks but only slight c/o dizziness.  Applied abdominal binder for last bouts of ambulation. She did say this helped her not feel as dizzy.  Pt sat in recliner and worked on UE exercises with dowel bar. Pushing and pulling exercises for shoulder.  Pt resting in recliner with all needs met, belt alarm on and call bell in reach.   Therapy Documentation Precautions:  Precautions Precautions: Fall Required Braces or Orthoses: Other Brace Other Brace: pt wears neoprene type sleeve on B knees due to arthritic pain Restrictions Weight Bearing Restrictions: No  Pain: Pain Assessment Pain Scale: 0-10 Pain Score: 0-No pain ADL: ADL Eating: Set up Grooming: Supervision/safety Where Assessed-Grooming: Sitting at sink Upper Body Bathing: Supervision/safety Where Assessed-Upper Body Bathing: Other (Comment) (toilet) Lower Body Bathing: Contact guard Where  Assessed-Lower Body Bathing:  (toilet) Upper Body Dressing: Supervision/safety Where Assessed-Upper Body Dressing:  (toilet) Lower Body Dressing: Moderate assistance Where Assessed-Lower Body Dressing:  (toilet) Toileting: Contact guard Where Assessed-Toileting: Glass blower/designer: Close supervision Toilet Transfer Method: Arts development officer: Grab bars  Therapy/Group: Individual Therapy  Rolling Fields 01/22/2021, 10:30 AM

## 2021-01-22 NOTE — Plan of Care (Signed)
  Problem: Consults Goal: RH GENERAL PATIENT EDUCATION Description: See Patient Education module for education specifics. Outcome: Progressing   Problem: RH BOWEL ELIMINATION Goal: RH STG MANAGE BOWEL WITH ASSISTANCE Description: STG Manage Bowel with mod I Assistance. Outcome: Progressing Goal: RH STG MANAGE BOWEL W/MEDICATION W/ASSISTANCE Description: STG Manage Bowel with Medication with mod I  Assistance. Outcome: Progressing   Problem: RH BLADDER ELIMINATION Goal: RH STG MANAGE BLADDER WITH ASSISTANCE Description: STG Manage Bladder With mod I Assistance Outcome: Progressing   Problem: RH SKIN INTEGRITY Goal: RH STG SKIN FREE OF INFECTION/BREAKDOWN Description: W min assist Outcome: Progressing Goal: RH STG ABLE TO PERFORM INCISION/WOUND CARE W/ASSISTANCE Description: STG Able To Perform Incision/Wound Care With min Assistance. Outcome: Progressing   Problem: RH SAFETY Goal: RH STG ADHERE TO SAFETY PRECAUTIONS W/ASSISTANCE/DEVICE Description: STG Adhere to Safety Precautions With cues/reminders Assistance/Device. Outcome: Progressing   Problem: RH PAIN MANAGEMENT Goal: RH STG PAIN MANAGED AT OR BELOW PT'S PAIN GOAL Description: At or below level 4 Outcome: Progressing   Problem: RH KNOWLEDGE DEFICIT GENERAL Goal: RH STG INCREASE KNOWLEDGE OF SELF CARE AFTER HOSPITALIZATION Description:  Patient will be able to manage care at discharge using handouts and educational resources w cues/reminders Outcome: Progressing   Problem: Cardiac: Goal: Ability to achieve and maintain adequate cardiopulmonary perfusion will improve Description:  Patient will be able to manage care at discharge using handouts and educational resources w cues/reminders Outcome: Progressing   

## 2021-01-22 NOTE — Progress Notes (Signed)
PROGRESS NOTE   Subjective/Complaints: Patient seen laying in bed this morning.  She states she slept well overnight.  She denies complaints.  She was seen by heart failure team yesterday, notes reviewed-no changes.  ROS: Denies CP, SOB, N/V/D  Objective:   No results found. No results for input(s): WBC, HGB, HCT, PLT in the last 72 hours.  Recent Labs    01/21/21 0500 01/22/21 0516  NA 140 137  K 4.5 4.1  CL 109 108  CO2 25 24  GLUCOSE 75 81  BUN 14 12  CREATININE 0.75 0.63  CALCIUM 8.9 8.7*     Intake/Output Summary (Last 24 hours) at 01/22/2021 1211 Last data filed at 01/22/2021 0830 Gross per 24 hour  Intake 540 ml  Output --  Net 540 ml         Physical Exam: Vital Signs Blood pressure (!) 117/58, pulse 66, temperature 98.6 F (37 C), temperature source Oral, resp. rate 16, height 5' (1.524 m), weight 59.3 kg, SpO2 93 %. Constitutional: No distress . Vital signs reviewed. HENT: Normocephalic.  Atraumatic. Eyes: EOMI. No discharge. Cardiovascular: No JVD.  RRR. Respiratory: Normal effort.  No stridor.  Bilateral clear to auscultation. GI: Non-distended.  BS +. Skin: Warm and dry.  Intact. Psych: Normal mood.  Normal behavior. Musc: No edema in extremities.  No tenderness in extremities. Neuro: Alert and oriented Motor: 4/5 throughout, stable  Assessment/Plan: 1. Functional deficits which require 3+ hours per day of interdisciplinary therapy in a comprehensive inpatient rehab setting. Physiatrist is providing close team supervision and 24 hour management of active medical problems listed below. Physiatrist and rehab team continue to assess barriers to discharge/monitor patient progress toward functional and medical goals  Care Tool:  Bathing    Body parts bathed by patient: Right arm, Left arm, Chest, Abdomen, Right upper leg, Face, Front perineal area, Left upper leg, Right lower leg, Left  lower leg, Buttocks   Body parts bathed by helper: Buttocks     Bathing assist Assist Level: Minimal Assistance - Patient > 75% (Posterior peri hygiene standing with grab bars)     Upper Body Dressing/Undressing Upper body dressing   What is the patient wearing?: Dress Orthosis activity level: Performed by helper  Upper body assist Assist Level: Set up assist    Lower Body Dressing/Undressing Lower body dressing      What is the patient wearing?: Underwear/pull up     Lower body assist Assist for lower body dressing: Minimal Assistance - Patient > 75%     Toileting Toileting    Toileting assist Assist for toileting: Supervision/Verbal cueing     Transfers Chair/bed transfer  Transfers assist     Chair/bed transfer assist level: Supervision/Verbal cueing     Locomotion Ambulation   Ambulation assist   Ambulation activity did not occur: Safety/medical concerns (Fatigue, syncope)  Assist level: Supervision/Verbal cueing Assistive device: Walker-rolling Max distance: 113'   Walk 10 feet activity   Assist  Walk 10 feet activity did not occur: Safety/medical concerns (Fatigue, syncope)  Assist level: Supervision/Verbal cueing Assistive device: Walker-rolling   Walk 50 feet activity   Assist Walk 50 feet with 2 turns activity  did not occur: Safety/medical concerns (Fatigue, syncope)  Assist level: Supervision/Verbal cueing Assistive device: Walker-rolling    Walk 150 feet activity   Assist Walk 150 feet activity did not occur: Safety/medical concerns (Fatigue, syncope)  Assist level: Supervision/Verbal cueing Assistive device: Walker-rolling    Walk 10 feet on uneven surface  activity   Assist Walk 10 feet on uneven surfaces activity did not occur: Safety/medical concerns (Fatigue, syncope)         Wheelchair     Assist Is the patient using a wheelchair?: Yes Type of Wheelchair: Manual Wheelchair activity did not occur:  Safety/medical concerns (Fatigue, syncope)         Wheelchair 50 feet with 2 turns activity    Assist    Wheelchair 50 feet with 2 turns activity did not occur: Safety/medical concerns (Fatigue, syncope)       Wheelchair 150 feet activity     Assist  Wheelchair 150 feet activity did not occur: Safety/medical concerns (Fatigue, syncope)       Blood pressure (!) 117/58, pulse 66, temperature 98.6 F (37 C), temperature source Oral, resp. rate 16, height 5' (1.524 m), weight 59.3 kg, SpO2 93 %.  Medical Problem List and Plan: 1.  Cardiopulmonary debility s/p NSTEMI, CHF Continue CIR  2.  Impaired mobility: -DVT/anticoagulation:  Pharmaceutical: Lovenox             -antiplatelet therapy: Continue ASA/Brillinta 3. Postoperative pain: Decrease Percocet to 1 tab daily prn   Monitor increased exertion  Controlled on 10/21 4. Mood: LCSW to follow for evaluation and support.              -antipsychotic agents: N/a 5. Neuropsych: This patient is capable of making decisions on her own behalf. 6. Skin/Wound Care: Routine pressure relief measures.   Santyl to wound 7. Fluids/Electrolytes/Nutrition: Monitor I/Os 8. NSTEMI/CAD s/p PCI/stent: Monitor for symptoms with increase in activity.        9. Acute systolic HF: Heart healthy diet. Check daily weights--admit wt 141 lbs. --Monitor for symptoms of overload.  --Holding spironolactone and Cozaar given hypotension, Continue Digoxin, ASA/Brillinta and Lipitor  Filed Weights   01/20/21 0500 01/21/21 0500 01/22/21 0500  Weight: 61.9 kg 59.5 kg 59.3 kg   Stable on 10/21 10. Hypothyroid: On supplement for past month.  TSH ordered per cards             --Improving from 41.4-->4.8 in three weeks.  11. AKI: Resolved 12. Shocked liver:   ALT elevated, but improving on 10/11  13. Acute blood loss anemia: Groin hematomas appear resolved.  Hemoglobin 9.6 on 10/16, labs ordered for Monday 14. Low albumin levels: Continue Ensure  BID--family encouraged to bring low salt foods from home. 15. Bilateral Knee pain: Voltaren gel added to help manage symptoms.   Controlled with voltaren on 10/19 16. Constipation: Increased senna to bid (does not like daily miralax-->d/c and continue MOM  17. Orthostasis w/syncope:              --change to THT and order abdominal binder.              --encourage oral intake.   Orthostatics negative on 10/13, however patient with symptoms consistent with orthostasis  Increased midodrine 5mg  TID  Improving 18. Hyponatremia: Resolved  Na 137 on 10/21  Continue to monitor 19. Hypokalemia  K+ 4.1 on 10/21 after supplementation, labs ordered for Monday  LOS: 11 days A FACE TO FACE EVALUATION WAS PERFORMED  Sherlonda Flater Friday  01/22/2021, 12:11 PM

## 2021-01-23 NOTE — Plan of Care (Signed)
  Problem: Consults Goal: RH GENERAL PATIENT EDUCATION Description: See Patient Education module for education specifics. Outcome: Progressing   Problem: RH BOWEL ELIMINATION Goal: RH STG MANAGE BOWEL WITH ASSISTANCE Description: STG Manage Bowel with mod I Assistance. Outcome: Progressing Goal: RH STG MANAGE BOWEL W/MEDICATION W/ASSISTANCE Description: STG Manage Bowel with Medication with mod I  Assistance. Outcome: Progressing   Problem: RH BLADDER ELIMINATION Goal: RH STG MANAGE BLADDER WITH ASSISTANCE Description: STG Manage Bladder With mod I Assistance Outcome: Progressing   Problem: RH SKIN INTEGRITY Goal: RH STG SKIN FREE OF INFECTION/BREAKDOWN Description: W min assist Outcome: Progressing Goal: RH STG ABLE TO PERFORM INCISION/WOUND CARE W/ASSISTANCE Description: STG Able To Perform Incision/Wound Care With min Assistance. Outcome: Progressing   Problem: RH SAFETY Goal: RH STG ADHERE TO SAFETY PRECAUTIONS W/ASSISTANCE/DEVICE Description: STG Adhere to Safety Precautions With cues/reminders Assistance/Device. Outcome: Progressing   Problem: RH PAIN MANAGEMENT Goal: RH STG PAIN MANAGED AT OR BELOW PT'S PAIN GOAL Description: At or below level 4 Outcome: Progressing   Problem: RH KNOWLEDGE DEFICIT GENERAL Goal: RH STG INCREASE KNOWLEDGE OF SELF CARE AFTER HOSPITALIZATION Description:  Patient will be able to manage care at discharge using handouts and educational resources w cues/reminders Outcome: Progressing   Problem: Cardiac: Goal: Ability to achieve and maintain adequate cardiopulmonary perfusion will improve Description:  Patient will be able to manage care at discharge using handouts and educational resources w cues/reminders Outcome: Progressing

## 2021-01-23 NOTE — Progress Notes (Signed)
Physical Therapy Session Note  Patient Details  Name: Jane Navarro MRN: [id number] Date of Birth: [birth year]  Today's Date: 01/23/2021 PT Individual Time: 8242-9980 PT Individual Time Calculation (min): 25 min   Short Term Goals: Week 1:  PT Short Term Goal 1 (Week 1): Pt will ascend/descend 4 6" steps w/BUE support and min A PT Short Term Goal 1 - Progress (Week 1): Progressing toward goal PT Short Term Goal 2 (Week 1): Pt will ambulate 25' w/LRAD and min A PT Short Term Goal 2 - Progress (Week 1): Met PT Short Term Goal 3 (Week 1): Pt will self-propel 50' in [facility] w/CGA PT Short Term Goal 3 - Progress (Week 1): Progressing toward goal  Skilled Therapeutic Interventions/Progress Updates:  Pt performed all sit to stand and stand pivot transfers with c/g and rolling walker. Pt performed toilet transfers with rolling walker and c/g to min A. Pt ambulated 150 feet with c/g and rolling walker. Pt requires increased time for all activity. Pt left sitting up in recliner with alarm on and family at bedside.   Therapy Documentation Precautions:  Precautions Precautions: Fall Required Braces or Orthoses: Other Brace Other Brace: pt wears neoprene type sleeve on B knees due to arthritic pain Restrictions Weight Bearing Restrictions: No RLE Weight Bearing: Non weight bearing LLE Weight Bearing: Non weight bearing General:  Pain: No c/o pain.  Therapy/Group: Individual Therapy  Dub Amis 01/23/2021, 4:08 PM

## 2021-01-23 NOTE — Progress Notes (Signed)
PROGRESS NOTE   Subjective/Complaints: Eating lunch No complaints Asks about using plant-based butter Asked about vegan diet  ROS: Denies CP, SOB, N/V/D  Objective:   No results found. No results for input(s): WBC, HGB, HCT, PLT in the last 72 hours.  Recent Labs    01/21/21 0500 01/22/21 0516  NA 140 137  K 4.5 4.1  CL 109 108  CO2 25 24  GLUCOSE 75 81  BUN 14 12  CREATININE 0.75 0.63  CALCIUM 8.9 8.7*    Intake/Output Summary (Last 24 hours) at 01/23/2021 1609 Last data filed at 01/23/2021 1300 Gross per 24 hour  Intake 630 ml  Output --  Net 630 ml        Physical Exam: Vital Signs Blood pressure 128/88, pulse 77, temperature 98.6 F (37 C), resp. rate 18, height 5' (1.524 m), weight 59.6 kg, SpO2 95 %. Gen: no distress, normal appearing HEENT: oral mucosa pink and moist, NCAT Cardio: Reg rate Chest: normal effort, normal rate of breathing Abd: soft, non-distended Ext: no edema Psych: pleasant, normal affect Skin: Warm and dry.  Intact. Psych: Normal mood.  Normal behavior. Musc: No edema in extremities.  No tenderness in extremities. Neuro: Alert and oriented Motor: 4/5 throughout, stable  Assessment/Plan: 1. Functional deficits which require 3+ hours per day of interdisciplinary therapy in a comprehensive inpatient rehab setting. Physiatrist is providing close team supervision and 24 hour management of active medical problems listed below. Physiatrist and rehab team continue to assess barriers to discharge/monitor patient progress toward functional and medical goals  Care Tool:  Bathing    Body parts bathed by patient: Right arm, Left arm, Chest, Abdomen, Right upper leg, Face, Front perineal area, Left upper leg, Right lower leg, Left lower leg, Buttocks   Body parts bathed by helper: Buttocks     Bathing assist Assist Level: Minimal Assistance - Patient > 75% (Posterior peri hygiene  standing with grab bars)     Upper Body Dressing/Undressing Upper body dressing   What is the patient wearing?: Dress Orthosis activity level: Performed by helper  Upper body assist Assist Level: Set up assist    Lower Body Dressing/Undressing Lower body dressing      What is the patient wearing?: Pants     Lower body assist Assist for lower body dressing: Contact Guard/Touching assist     Toileting Toileting    Toileting assist Assist for toileting: Supervision/Verbal cueing     Transfers Chair/bed transfer  Transfers assist     Chair/bed transfer assist level: Supervision/Verbal cueing     Locomotion Ambulation   Ambulation assist   Ambulation activity did not occur: Safety/medical concerns (Fatigue, syncope)  Assist level: Supervision/Verbal cueing Assistive device: Walker-rolling Max distance: 240   Walk 10 feet activity   Assist  Walk 10 feet activity did not occur: Safety/medical concerns (Fatigue, syncope)  Assist level: Supervision/Verbal cueing Assistive device: Walker-rolling   Walk 50 feet activity   Assist Walk 50 feet with 2 turns activity did not occur: Safety/medical concerns (Fatigue, syncope)  Assist level: Supervision/Verbal cueing Assistive device: Walker-rolling    Walk 150 feet activity   Assist Walk 150 feet activity did not  occur: Safety/medical concerns (Fatigue, syncope)  Assist level: Supervision/Verbal cueing Assistive device: Walker-rolling    Walk 10 feet on uneven surface  activity   Assist Walk 10 feet on uneven surfaces activity did not occur: Safety/medical concerns (Fatigue, syncope)         Wheelchair     Assist Is the patient using a wheelchair?: Yes Type of Wheelchair: Manual Wheelchair activity did not occur: Safety/medical concerns (Fatigue, syncope)         Wheelchair 50 feet with 2 turns activity    Assist    Wheelchair 50 feet with 2 turns activity did not occur:  Safety/medical concerns (Fatigue, syncope)       Wheelchair 150 feet activity     Assist  Wheelchair 150 feet activity did not occur: Safety/medical concerns (Fatigue, syncope)       Blood pressure 128/88, pulse 77, temperature 98.6 F (37 C), resp. rate 18, height 5' (1.524 m), weight 59.6 kg, SpO2 95 %.  Medical Problem List and Plan: 1.  Cardiopulmonary debility s/p NSTEMI, CHF Continue CIR  2.  Impaired mobility: Continue Lovenox             -antiplatelet therapy: Continue ASA/Brillinta 3. Postoperative pain: D/c percocet. Provided with pain relief journal  Monitor increased exertion 4. Mood: LCSW to follow for evaluation and support.              -antipsychotic agents: N/a 5. Neuropsych: This patient is capable of making decisions on her own behalf. 6. Skin/Wound Care: Routine pressure relief measures.   Santyl to wound 7. Fluids/Electrolytes/Nutrition: Monitor I/Os 8. NSTEMI/CAD s/p PCI/stent: Monitor for symptoms with increase in activity.        9. Acute systolic HF: Heart healthy diet. Check daily weights--admit wt 141 lbs. Provided dietary counseling --Monitor for symptoms of overload.  --Holding spironolactone and Cozaar given hypotension, Continue Digoxin, ASA/Brillinta and Lipitor  Filed Weights   01/21/21 0500 01/22/21 0500 01/23/21 0351  Weight: 59.5 kg 59.3 kg 59.6 kg   Stable on 10/22 10. Hypothyroid: On supplement for past month.  TSH ordered per cards             --Improving from 41.4-->4.8 in three weeks.  11. AKI: Resolved 12. Shocked liver:   ALT elevated, but improving on 10/11  13. Acute blood loss anemia: Groin hematomas appear resolved.  Hemoglobin 9.6 on 10/16, labs ordered for Monday 14. Low albumin levels: Continue Ensure BID--family encouraged to bring low salt foods from home. 15. Bilateral Knee pain: Voltaren gel added to help manage symptoms.   Controlled with voltaren on 10/19 16. Constipation: Increased senna to bid (does not like  daily miralax-->d/c and continue MOM  17. Orthostasis w/syncope:              --change to THT and order abdominal binder.              --encourage oral intake.   Orthostatics negative on 10/13, however patient with symptoms consistent with orthostasis  Increased midodrine 5mg  TID  Improving 18. Hyponatremia: Resolved  Na 137 on 10/21  Continue to monitor 19. Hypokalemia  K+ 4.1 on 10/21 after supplementation, labs ordered for Monday  LOS: 12 days A FACE TO FACE EVALUATION WAS PERFORMED  Tuesday Jane Navarro 01/23/2021, 4:09 PM

## 2021-01-23 NOTE — Progress Notes (Signed)
Physical Therapy Session Note  Patient Details  Name: Jane Navarro MRN: [id number] Date of Birth: [birth year]  Today's Date: 01/23/2021 PT Individual Time: 1108-1203 PT Individual Time Calculation (min): 55 min   Short Term Goals: Week 1:  PT Short Term Goal 1 (Week 1): Pt will ascend/descend 4 6" steps w/BUE support and min A PT Short Term Goal 1 - Progress (Week 1): Progressing toward goal PT Short Term Goal 2 (Week 1): Pt will ambulate 25' w/LRAD and min A PT Short Term Goal 2 - Progress (Week 1): Met PT Short Term Goal 3 (Week 1): Pt will self-propel 50' in [facility] w/CGA PT Short Term Goal 3 - Progress (Week 1): Progressing toward goal  Skilled Therapeutic Interventions/Progress Updates:  Pt was seen bedside in the am sitting up in bedside chair. Pt required time for all activities. Pt performed all sit to stand transfers and stand pivot transfers with rolling walker and S with occasional verbal cues. Pt performed toilet transfers with c/g and verbal cues. S for hygiene. Pt ambulated 12, 10, 230 and 240 feet with rolling walker and S with occasional verbal cues. Pt ambulates with decreased step length and slow cadence, no loss of balance. Pt performed block transfers, sit to stand, 3 sets x 3 reps each with rolling walker and S. Pt left sitting up in recliner with all needs within reach and chair alarm in place.   Therapy Documentation Precautions:  Precautions Precautions: Fall Required Braces or Orthoses: Other Brace Other Brace: pt wears neoprene type sleeve on B knees due to arthritic pain Restrictions Weight Bearing Restrictions: No RLE Weight Bearing: Non weight bearing LLE Weight Bearing: Non weight bearing General: Pain: Pain Assessment Pain Scale: 0-10 Pain Score: 0-No pain    Therapy/Group: Individual Therapy  Dub Amis 01/23/2021, 11:59 AM

## 2021-01-23 NOTE — Progress Notes (Signed)
Occupational Therapy Session Note  Patient Details  Name: Jane Navarro MRN: [id number] Date of Birth: 1945-09-11  Today's Date: 01/24/2021 OT Individual Time: 0347-4259 OT Individual Time Calculation (min): 86 min    Skilled Therapeutic Interventions/Progress Updates:    Pt greeted in bed with no c/o pain. Agreeable to shower this AM. Setup for supine<sit from flat bed without bedrails and then abdominal binder was donned. Close supervision for ambulatory transfer to the toilet using RW where pt had +bladder void. She reported feeling a little lightheaded after ambulation, used breathing strategies to independently absolve. Close supervision for transfer to TTB where she then doffed remaining clothing and bathed at seated level. Assistance required for washing feet, mod cues for sequencing and pt able to complete perihygiene using leans with cuing alone. She dressed at sit<stand level using RW from elevated toilet after. 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 When she returned to the recliner after, pt reported feeling great, no s/s orthostatics. OT printed her off an energy conservation handout and we discussed functional implications of using strategies during ADL/IADL activity at home. Pt very receptive to education and collaborative during discussion. Pt remained sitting up at close of session, all needs within reach and safety belt fastened.    Therapy Documentation Precautions:  Precautions Precautions: Fall Required Braces or Orthoses: Other Brace Other Brace: pt wears neoprene type sleeve on B knees due to arthritic pain Restrictions Weight Bearing Restrictions: No RLE Weight Bearing: Non weight bearing LLE Weight Bearing: Non weight bearing  Pain: Pain Assessment Pain Scale: 0-10 Pain Score: 0-No pain ADL: ADL Eating: Set up Grooming: Supervision/safety Where Assessed-Grooming: Sitting at sink Upper Body Bathing:  Supervision/safety Where Assessed-Upper Body Bathing: Other (Comment) (toilet) Lower Body Bathing: Contact guard Where Assessed-Lower Body Bathing:  (toilet) Upper Body Dressing: Supervision/safety Where Assessed-Upper Body Dressing:  (toilet) Lower Body Dressing: Moderate assistance Where Assessed-Lower Body Dressing:  (toilet) Toileting: Contact guard Where Assessed-Toileting: Teacher, adult education: Close supervision Toilet Transfer Method: Surveyor, minerals: Grab bars   Therapy/Group: Individual Therapy  Elzia Hott A Tashawnda Bleiler 01/24/2021, 12:33 PM

## 2021-01-24 NOTE — Discharge Instructions (Addendum)
Inpatient Rehab Discharge Instructions  Jane Navarro Discharge date and time: 01/25/21   Activities/Precautions/ Functional Status: Activity: No strenuous activity. Needs supervision with mobility.  Diet: Heart healthy/low salt.  Wound Care: Routine skin checks   Functional status:  ___ No restrictions     ___ Walk up steps independently _X__ 24/7 supervision/assistance   ___ Walk up steps with assistance ___ Intermittent supervision/assistance  ___ Bathe/dress independently ___ Walk with walker     _X__ Bathe/dress with assistance ___ Walk Independently    ___ Shower independently ___ Walk with assistance    ___ Shower with assistance _X__ No alcohol     ___ Return to work/school ________   Special Instructions: No driving, smoking or alcohol 2.  Need to wear support stockings during the day. Remove them at bedtime. Need to wear binder when up and walking --if you start feeling weak/dizziness recurs.  Loosen when seated. 3. Need to schedule an appointment with PCP for medical follow up in 1-2 weeks.     COMMUNITY REFERRALS UPON DISCHARGE:    Home Health:   PT OT  RN                 Agency: [facility] Phone: [phone]  Medical Equipment/Items Ordered:HAS ALL NEEDED EQUIPMENT FROM PREVIOUS ADMITS                                                 Agency/Supplier: NA   My questions have been answered and I understand these instructions. I will adhere to these goals and the provided educational materials after my discharge from the hospital.  Patient/Caregiver Signature _______________________________ Date __________  Clinician Signature _______________________________________ Date __________  Please bring this form and your medication list with you to all your follow-up doctor's appointments.

## 2021-01-24 NOTE — Plan of Care (Signed)
  Problem: Consults Goal: RH GENERAL PATIENT EDUCATION Description: See Patient Education module for education specifics. Outcome: Progressing   Problem: RH BOWEL ELIMINATION Goal: RH STG MANAGE BOWEL WITH ASSISTANCE Description: STG Manage Bowel with mod I Assistance. Outcome: Progressing Goal: RH STG MANAGE BOWEL W/MEDICATION W/ASSISTANCE Description: STG Manage Bowel with Medication with mod I  Assistance. Outcome: Progressing   Problem: RH BLADDER ELIMINATION Goal: RH STG MANAGE BLADDER WITH ASSISTANCE Description: STG Manage Bladder With mod I Assistance Outcome: Progressing   Problem: RH SKIN INTEGRITY Goal: RH STG SKIN FREE OF INFECTION/BREAKDOWN Description: W min assist Outcome: Progressing Goal: RH STG ABLE TO PERFORM INCISION/WOUND CARE W/ASSISTANCE Description: STG Able To Perform Incision/Wound Care With min Assistance. Outcome: Progressing   Problem: RH SAFETY Goal: RH STG ADHERE TO SAFETY PRECAUTIONS W/ASSISTANCE/DEVICE Description: STG Adhere to Safety Precautions With cues/reminders Assistance/Device. Outcome: Progressing   Problem: RH PAIN MANAGEMENT Goal: RH STG PAIN MANAGED AT OR BELOW PT'S PAIN GOAL Description: At or below level 4 Outcome: Progressing   Problem: RH KNOWLEDGE DEFICIT GENERAL Goal: RH STG INCREASE KNOWLEDGE OF SELF CARE AFTER HOSPITALIZATION Description:  Patient will be able to manage care at discharge using handouts and educational resources w cues/reminders Outcome: Progressing   Problem: Cardiac: Goal: Ability to achieve and maintain adequate cardiopulmonary perfusion will improve Description:  Patient will be able to manage care at discharge using handouts and educational resources w cues/reminders Outcome: Progressing   

## 2021-01-24 NOTE — Progress Notes (Signed)
Physical Therapy Session Note  Patient Details  Name: Jane Navarro MRN: [id number] Date of Birth: [birth year]  Today's Date: 01/24/2021 PT Individual Time: 1300-1358 PT Individual Time Calculation (min): 58 min   Short Term Goals: Week 2:  PT Short Term Goal 1 (Week 2): STG = LTG due to LOS   Skilled Therapeutic Interventions/Progress Updates:    Pt presents in recliner, denies any complaints of orthostasis throughout session. Abdominal binder donned at start of session. Functional transfers including toilet transfers, w/c, and recliner transfers at overall supervision level  using RW and extra time needed due to decreased endurance. Pt performs shorter distance gait and up to about 54' during session at overall supervision level with RW with cues for upright posture. Dynamic gait to simulate home and community mobility through obstacle course navigating turns, sidestepping and stepping over simulated threshold with close supervision to CGA and cues for sequencing with RW placement over threshold initially but demonstrates good carryover next attempt. Pt performs up/down 6" step with rails on each side x 6 reps with CGA and cues for leading with stronger leg ascending for improved safety and technique. Pt reports she will have a ramp access at her son's house and will not need to do stairs at d/c. Pt engaged in NMR for balance retraining on compliant surface while performing functional reaching tasks outside BOS with 1 UE support x 2 trials with emphasis on items on L and R side with seated rest break between trials with overall CGA for balance. End of session, returned to recliner including ambulatory transfer with RW with overall supervision. Pt does require extended rest breaks between activities due to decreased endurance/activity tolerance, but overall with excellent participation. Pt making good functional gains towards goals.   Therapy Documentation Precautions:  Precautions Precautions:  Fall Required Braces or Orthoses: Other Brace Other Brace: pt wears neoprene type sleeve on B knees due to arthritic pain Restrictions Weight Bearing Restrictions: No   Pain: Pain Assessment Pain Scale: 0-10 Pain Score: 0-No pain    Therapy/Group: Individual Therapy  Karolee Stamps Darrol Poke, PT, DPT, CBIS  01/24/2021, 2:01 PM

## 2021-01-25 LAB — CBC WITH DIFFERENTIAL/PLATELET
Abs Immature Granulocytes: 0.01 10*3/uL (ref 0.00–0.07)
Basophils Absolute: 0 10*3/uL (ref 0.0–0.1)
Basophils Relative: 1 %
Eosinophils Absolute: 0.1 10*3/uL (ref 0.0–0.5)
Eosinophils Relative: 1 %
HCT: 32.5 % — ABNORMAL LOW (ref 36.0–46.0)
Hemoglobin: 10 g/dL — ABNORMAL LOW (ref 12.0–15.0)
Immature Granulocytes: 0 %
Lymphocytes Relative: 35 %
Lymphs Abs: 1.8 10*3/uL (ref 0.7–4.0)
MCH: 30.6 pg (ref 26.0–34.0)
MCHC: 30.8 g/dL (ref 30.0–36.0)
MCV: 99.4 fL (ref 80.0–100.0)
Monocytes Absolute: 0.5 10*3/uL (ref 0.1–1.0)
Monocytes Relative: 10 %
Neutro Abs: 2.8 10*3/uL (ref 1.7–7.7)
Neutrophils Relative %: 53 %
Platelets: 253 10*3/uL (ref 150–400)
RBC: 3.27 MIL/uL — ABNORMAL LOW (ref 3.87–5.11)
RDW: 17.7 % — ABNORMAL HIGH (ref 11.5–15.5)
WBC: 5.2 10*3/uL (ref 4.0–10.5)
nRBC: 0 % (ref 0.0–0.2)

## 2021-01-25 LAB — BASIC METABOLIC PANEL
Anion gap: 7 (ref 5–15)
BUN: 13 mg/dL (ref 8–23)
CO2: 28 mmol/L (ref 22–32)
Calcium: 8.6 mg/dL — ABNORMAL LOW (ref 8.9–10.3)
Chloride: 102 mmol/L (ref 98–111)
Creatinine, Ser: 0.71 mg/dL (ref 0.44–1.00)
GFR, Estimated: >60 mL/min (ref >60)
Glucose, Bld: 85 mg/dL (ref 70–99)
Potassium: 3.8 mmol/L (ref 3.5–5.1)
Sodium: 137 mmol/L (ref 135–145)

## 2021-01-25 NOTE — Progress Notes (Addendum)
Advanced Heart Failure Rounding Note  PCP-Cardiologist: Parke Poisson, MD  HF: Dr. Gala Romney  Subjective:    Feels good today. Happy w/ her progress made in CIR.   Dizziness resolved w/ midodrine increase. Denies exertional dyspnea. No CP.   Objective:   Weight Range: 58.1 kg Body mass index is 25.02 kg/m.   Vital Signs:   Temp:  [98 F (36.7 C)-98.5 F (36.9 C)] 98 F (36.7 C) (10/24 0343) Pulse Rate:  [60-78] 66 (10/24 0812) Resp:  [16-18] 16 (10/24 0343) BP: (137-148)/(73-82) 137/73 (10/24 0343) SpO2:  [96 %-100 %] 96 % (10/24 0343) Weight:  [58.1 kg] 58.1 kg (10/24 0441) Last BM Date: 01/23/21  Weight change: Filed Weights   01/23/21 0351 01/24/21 0346 01/25/21 0441  Weight: 59.6 kg 58.5 kg 58.1 kg    Intake/Output:   Intake/Output Summary (Last 24 hours) at 01/25/2021 0824 Last data filed at 01/25/2021 0801 Gross per 24 hour  Intake 755 ml  Output --  Net 755 ml      Physical Exam    General:  Well appearing. No respiratory difficulty HEENT: dentition in poor repair, otherwise normal Neck: supple. no JVD. Carotids 2+ bilat; no bruits. No lymphadenopathy or thyromegaly appreciated. Cor: PMI nondisplaced. Regular rate & rhythm. No rubs, gallops or murmurs. Lungs: clear Abdomen: soft, nontender, nondistended. No hepatosplenomegaly. No bruits or masses. Good bowel sounds. Extremities: no cyanosis, clubbing, rash, edema Neuro: alert & oriented x 3, cranial nerves grossly intact. moves all 4 extremities w/o difficulty. Affect pleasant.  Labs    CBC No results for input(s): WBC, NEUTROABS, HGB, HCT, MCV, PLT in the last 72 hours.  Basic Metabolic Panel No results for input(s): NA, K, CL, CO2, GLUCOSE, BUN, CREATININE, CALCIUM, MG, PHOS in the last 72 hours.  Liver Function Tests No results for input(s): AST, ALT, ALKPHOS, BILITOT, PROT, ALBUMIN in the last 72 hours.  No results for input(s): LIPASE, AMYLASE in the last 72 hours. Cardiac  Enzymes No results for input(s): CKTOTAL, CKMB, CKMBINDEX, TROPONINI in the last 72 hours.  BNP: BNP (last 3 results) Recent Labs    12/29/20 0014  BNP >4,500.0*    ProBNP (last 3 results) No results for input(s): PROBNP in the last 8760 hours.   D-Dimer No results for input(s): DDIMER in the last 72 hours. Hemoglobin A1C No results for input(s): HGBA1C in the last 72 hours. Fasting Lipid Panel No results for input(s): CHOL, HDL, LDLCALC, TRIG, CHOLHDL, LDLDIRECT in the last 72 hours. Thyroid Function Tests No results for input(s): TSH, T4TOTAL, T3FREE, THYROIDAB in the last 72 hours.  Invalid input(s): FREET3   Other results:   Imaging    No results found.   Medications:     Scheduled Medications:  aspirin EC  81 mg Oral Daily   atorvastatin  80 mg Oral Daily   collagenase   Topical Daily   digoxin  0.0625 mg Oral Daily   docusate sodium  200 mg Oral Daily   enoxaparin (LOVENOX) injection  40 mg Subcutaneous Q24H   ezetimibe  10 mg Oral Daily   feeding supplement  237 mL Oral BID BM   levothyroxine  100 mcg Oral Q0600   magnesium hydroxide  30 mL Oral Daily   midodrine  5 mg Oral TID WC   senna  2 tablet Oral BID   thiamine  100 mg Oral Daily   ticagrelor  90 mg Oral BID    Infusions:   PRN Medications: acetaminophen,  alum & mag hydroxide-simeth, bisacodyl, diphenhydrAMINE, guaiFENesin-dextromethorphan, lip balm, melatonin, oxyCODONE-acetaminophen, polyethylene glycol, prochlorperazine **OR** prochlorperazine **OR** prochlorperazine, sodium chloride, sodium phosphate    Patient Profile  Admitted 9/27 with NSTEMI complicated by cardiogenic shock.   Assessment/Plan  1. 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 ECHO EF 25-30% and severely reduced RV function.  - LHC with severe L main with multivessel  proximal LAD and mild RCA .  - In the cath lab PA sat < 40%., CI 1.6.  Placed Impella 2.5 placed and started on milrinone  -  Felt too high risk for CABG.  - 9/30 PCI to RCA with DES x 3, deferred intervention on left main with difficulty wiring the LAD and non-flow limiting dissection.  Can consider repeat PCI attempt (versus off-pump LIMA) down the road as she improves - Continue ASA 81, ticagrelor, atorvastatin. - No chest pain.    2.  Acute systolic HF ->  Cardiogenic shock:  - Ischemic cardiomyopathy.  Echo with EF 25-30%, severely decreased RV function, moderate-severe TR, moderate MR. Required MCS w/ Impella. Impella removed 10/3.  - Loop diuretics on hold due to orthostasis and low volume. Volume status ok  - Continue digoxin 0.125 mcg , dig level 0.5   - Avoiding SGLT2i due to groin infections.  - No beta blocker d/t recent shock - Off losartan and spiro d/t orthostatic dizziness. - BP much improved. No further orthostatic hypotension  - Continue ted hoses when oob.    3. Shock Liver  -Resolved     4. Anemia  - Hgb dropped to 6.5 initially>> given 2UPRBCS with appropriate rise to 8.9.  - 1U RBCs given 9/29.   - Significant hematoma left groin post-intervention, had 2 units PRBCs 9/30 with appropriate increase in hgb. IABP removed 10/3. Site remains stable  - stable at Hgb 9.6 on recent labs   5. Thrombocytopenia due to Impella - resolved   6. H/O AKI:  - resolved - Renal function normalized.   7. Orthostatic hypotension: - In setting of poor PO intake, now improving  - BP improved. Denies further symptoms.  - Continue midodrine 5 mg tid .  - Continue to hold spiro and losartan  - Continue ted hose when oob.   8. Debility -making progress, therapy extended to 10/27  -CIR appreciated.   Length of Stay: [address], PA-C  01/25/2021, 8:24 AM  Advanced Heart Failure Team Pager [phone] (M-F; 7a - 5p)  Please contact [facility] for night-coverage after hours (5p -7a ) and weekends on [email or web address]  Patient seen and examined with the above-signed Advanced Practice Provider  and/or Housestaff. I personally reviewed laboratory data, imaging studies and relevant notes. I independently examined the patient and formulated the important aspects of the plan. I have edited the note to reflect any of my changes or salient points. I have personally discussed the plan with the patient and/or family.  Doing well. Making good progress. BP stable with midodrine. Denies angina. Volume status ok.   General:  Sitting in chair No resp difficulty HEENT: normal Neck: supple. no JVD. Carotids 2+ bilat; no bruits. No lymphadenopathy or thryomegaly appreciated. Cor: PMI nondisplaced. Regular rate & rhythm. No rubs, gallops or murmurs. Lungs: clear Abdomen: soft, nontender, nondistended. No hepatosplenomegaly. No bruits or masses. Good bowel sounds. Extremities: no cyanosis, clubbing, rash, edema Neuro: alert & orientedx3, cranial nerves grossly intact. moves all 4 extremities w/o difficulty. Affect pleasant  Stable on current regimen. Making good progress. Plan  for d/c from CIR midweek. Continue current regimen.   Arvilla Meres, MD  9:24 PM

## 2021-01-25 NOTE — Progress Notes (Signed)
PROGRESS NOTE   Subjective/Complaints:  Pt reports doing better- "real good"- BP doing better.  No orthostatic hypotension lately.  Can walk now- Slightly loose BM last night.  Peeing OK.   ROS:  Pt denies SOB, abd pain, CP, N/V/C/D, and vision changes   Objective:   No results found. Recent Labs    01/25/21 1240  WBC 5.2  HGB 10.0*  HCT 32.5*  PLT 253    Recent Labs    01/25/21 1240  NA 137  K 3.8  CL 102  CO2 28  GLUCOSE 85  BUN 13  CREATININE 0.71  CALCIUM 8.6*    Intake/Output Summary (Last 24 hours) at 01/25/2021 1917 Last data filed at 01/25/2021 1853 Gross per 24 hour  Intake 840 ml  Output --  Net 840 ml        Physical Exam: Vital Signs Blood pressure 126/71, pulse 65, temperature 98.6 F (37 C), resp. rate 16, height 5' (1.524 m), weight 58.1 kg, SpO2 95 %.    General: awake, alert, appropriate, NAD HENT: conjugate gaze; oropharynx moist- missing some bottom teeth CV: regular rate; no JVD Pulmonary: CTA B/L; no W/R/R- good air movement GI: soft, NT, ND, (+)BS Psychiatric: appropriate Neurological: Ox3  Skin: Warm and dry.  Intact. Psych: Normal mood.  Normal behavior. Musc: No edema in extremities.  No tenderness in extremities. Neuro: Alert and oriented Motor: 4/5 throughout, stable  Assessment/Plan: 1. Functional deficits which require 3+ hours per day of interdisciplinary therapy in a comprehensive inpatient rehab setting. Physiatrist is providing close team supervision and 24 hour management of active medical problems listed below. Physiatrist and rehab team continue to assess barriers to discharge/monitor patient progress toward functional and medical goals  Care Tool:  Bathing    Body parts bathed by patient: Right arm, Left arm, Chest, Abdomen, Right upper leg, Face, Front perineal area, Left upper leg, Right lower leg, Left lower leg, Buttocks   Body parts bathed  by helper: Buttocks     Bathing assist Assist Level: Minimal Assistance - Patient > 75% (Posterior peri hygiene standing with grab bars)     Upper Body Dressing/Undressing Upper body dressing   What is the patient wearing?: Dress Orthosis activity level: Performed by helper  Upper body assist Assist Level: Set up assist    Lower Body Dressing/Undressing Lower body dressing      What is the patient wearing?: Pants     Lower body assist Assist for lower body dressing: Contact Guard/Touching assist     Toileting Toileting    Toileting assist Assist for toileting: Supervision/Verbal cueing     Transfers Chair/bed transfer  Transfers assist     Chair/bed transfer assist level: Supervision/Verbal cueing     Locomotion Ambulation   Ambulation assist   Ambulation activity did not occur: Safety/medical concerns (Fatigue, syncope)  Assist level: Supervision/Verbal cueing Assistive device: Walker-rolling Max distance: 75'   Walk 10 feet activity   Assist  Walk 10 feet activity did not occur: Safety/medical concerns (Fatigue, syncope)  Assist level: Supervision/Verbal cueing Assistive device: Walker-rolling   Walk 50 feet activity   Assist Walk 50 feet with 2 turns activity did not  occur: Safety/medical concerns (Fatigue, syncope)  Assist level: Supervision/Verbal cueing Assistive device: Walker-rolling    Walk 150 feet activity   Assist Walk 150 feet activity did not occur: Safety/medical concerns (Fatigue, syncope)  Assist level: Supervision/Verbal cueing Assistive device: Walker-rolling    Walk 10 feet on uneven surface  activity   Assist Walk 10 feet on uneven surfaces activity did not occur: Safety/medical concerns (Fatigue, syncope)   Assist level: Supervision/Verbal cueing Assistive device: Walker-rolling   Wheelchair     Assist Is the patient using a wheelchair?: Yes Type of Wheelchair: Manual Wheelchair activity did not occur:  Safety/medical concerns (Fatigue, syncope)         Wheelchair 50 feet with 2 turns activity    Assist    Wheelchair 50 feet with 2 turns activity did not occur: Safety/medical concerns (Fatigue, syncope)       Wheelchair 150 feet activity     Assist  Wheelchair 150 feet activity did not occur: Safety/medical concerns (Fatigue, syncope)       Blood pressure 126/71, pulse 65, temperature 98.6 F (37 C), resp. rate 16, height 5' (1.524 m), weight 58.1 kg, SpO2 95 %.  Medical Problem List and Plan: 1.  Cardiopulmonary debility s/p NSTEMI, CHF Con't PT and OT_ CIR- team conference tomorrow 2.  Impaired mobility: Continue Lovenox             -antiplatelet therapy: Continue ASA/Brillinta 3. Postoperative pain: D/c percocet. Provided with pain relief journal  Monitor increased exertion 4. Mood: LCSW to follow for evaluation and support.              -antipsychotic agents: N/a 5. Neuropsych: This patient is capable of making decisions on her own behalf. 6. Skin/Wound Care: Routine pressure relief measures.   Santyl to wound 7. Fluids/Electrolytes/Nutrition: Monitor I/Os 8. NSTEMI/CAD s/p PCI/stent: Monitor for symptoms with increase in activity.        9. Acute systolic HF: Heart healthy diet. Check daily weights--admit wt 141 lbs. Provided dietary counseling --Monitor for symptoms of overload.  --Holding spironolactone and Cozaar given hypotension, Continue Digoxin, ASA/Brillinta and Lipitor  Filed Weights   01/23/21 0351 01/24/21 0346 01/25/21 0441  Weight: 59.6 kg 58.5 kg 58.1 kg   Stable on 10/22 10. Hypothyroid: On supplement for past month.  TSH ordered per cards             --Improving from 41.4-->4.8 in three weeks.  11. AKI: Resolved 12. Shocked liver:   ALT elevated, but improving on 10/11  13. Acute blood loss anemia: Groin hematomas appear resolved.  Hemoglobin 9.6 on 10/16, labs ordered for Monday  10/24- Hb 10.0- con't to monitor 14. Low albumin  levels: Continue Ensure BID--family encouraged to bring low salt foods from home. 15. Bilateral Knee pain: Voltaren gel added to help manage symptoms.   Controlled with voltaren on 10/19 16. Constipation: Increased senna to bid (does not like daily miralax-->d/c and continue MOM  17. Orthostasis w/syncope:              --change to THT and order abdominal binder.              --encourage oral intake.   Orthostatics negative on 10/13, however patient with symptoms consistent with orthostasis  Increased midodrine 5mg  TID  10/24- doing much bette-r walking some- con't regimen  Improving 18. Hyponatremia: Resolved  Na 137 on 10/21  Continue to monitor 19. Hypokalemia  K+ 4.1 on 10/21 after supplementation, labs ordered for Monday  10/24- K+ 3.8- will con't to monitor  LOS: 14 days A FACE TO FACE EVALUATION WAS PERFORMED  Jane Navarro 01/25/2021, 7:17 PM

## 2021-01-25 NOTE — Plan of Care (Signed)
  Problem: Consults Goal: RH GENERAL PATIENT EDUCATION Description: See Patient Education module for education specifics. Outcome: Progressing   Problem: RH BOWEL ELIMINATION Goal: RH STG MANAGE BOWEL WITH ASSISTANCE Description: STG Manage Bowel with mod I Assistance. Outcome: Progressing Goal: RH STG MANAGE BOWEL W/MEDICATION W/ASSISTANCE Description: STG Manage Bowel with Medication with mod I  Assistance. Outcome: Progressing   Problem: RH BLADDER ELIMINATION Goal: RH STG MANAGE BLADDER WITH ASSISTANCE Description: STG Manage Bladder With mod I Assistance Outcome: Progressing   Problem: RH SKIN INTEGRITY Goal: RH STG SKIN FREE OF INFECTION/BREAKDOWN Description: W min assist Outcome: Progressing Goal: RH STG ABLE TO PERFORM INCISION/WOUND CARE W/ASSISTANCE Description: STG Able To Perform Incision/Wound Care With min Assistance. Outcome: Progressing   Problem: RH SAFETY Goal: RH STG ADHERE TO SAFETY PRECAUTIONS W/ASSISTANCE/DEVICE Description: STG Adhere to Safety Precautions With cues/reminders Assistance/Device. Outcome: Progressing   Problem: RH PAIN MANAGEMENT Goal: RH STG PAIN MANAGED AT OR BELOW PT'S PAIN GOAL Description: At or below level 4 Outcome: Progressing   Problem: RH KNOWLEDGE DEFICIT GENERAL Goal: RH STG INCREASE KNOWLEDGE OF SELF CARE AFTER HOSPITALIZATION Description:  Patient will be able to manage care at discharge using handouts and educational resources w cues/reminders Outcome: Progressing   Problem: Cardiac: Goal: Ability to achieve and maintain adequate cardiopulmonary perfusion will improve Description:  Patient will be able to manage care at discharge using handouts and educational resources w cues/reminders Outcome: Progressing   

## 2021-01-25 NOTE — Progress Notes (Addendum)
Occupational Therapy Session Note  Patient Details  Name: Emmalia Heyboer MRN: [id number] Date of Birth: May 23, 1945  Today's Date: 01/25/2021 OT Individual Time: 1050-1200 OT Individual Time Calculation (min): 70 min    Short Term Goals: Week 2:  OT Short Term Goal 1 (Week 2): Pt will be able to tolerate standing for 3 minutes to be able to brush teeth at sink. OT Short Term Goal 2 (Week 2): Pt will complete 3/3 steps of toileting with supervision. OT Short Term Goal 3 (Week 2): Pt will be able to don underwear and socks over feet with S.  Skilled Therapeutic Interventions/Progress Updates:    Pt received in bed stating her son should be coming up this morning and she wants to him to see her walk.  She stated she showered yesterday so did not need to do self care except get on pants and brush teeth. Recommended she get a long bath sponge to use at home to help with her feet in the shower.  Pt sat to EOB and then donned pants over feet. I donned her TED hose and pt used sock aid to don socks. She then stood and adjusted pants over hips, ambulated to sink to brush teeth, then returned to arm chair to sit down.  No rest breaks needed !!! no c/o light headedness  Sat in arm chair to do UE exercises with 1# dowel bar ( 12 reps of chest press, bicep curls, torso twists) then she would get up and ambulate to door of room and sit back in arm chair. Repeated exercise sequence 3x. She continues to need min cues to fully reach back for chair before sitting.  Son arrived. Pt demonstrated to him how she used sock aid. He can purchase one on [place] and he will get her a long bath sponge.  He clarified that they have a shower seat and a BSC already. Pt then demonstrated for son how she gets up, ambulated with RW into hallway and then back to her room to sit in recliner.  Did not use abdominal binder as pt is not symptomatic today.   Pt resting in recliner with belt alarm on, lunch tray present.  Son with pt.    He clarified that they do have 4 stairs to get in the house with a rail on the R going up.  He will put a ramp in but pt will need to do the stairs initially.    Therapy Documentation Precautions:  Precautions Precautions: Fall Required Braces or Orthoses: Other Brace Other Brace: pt wears neoprene type sleeve on B knees due to arthritic pain Restrictions Weight Bearing Restrictions: No      Pain: Pain Assessment Pain Scale: 0-10 Pain Score: 0-No pain ADL: ADL Eating: Set up Grooming: Supervision/safety Where Assessed-Grooming: Sitting at sink Upper Body Bathing: Supervision/safety Where Assessed-Upper Body Bathing: Other (Comment) (toilet) Lower Body Bathing: Contact guard Where Assessed-Lower Body Bathing:  (toilet) Upper Body Dressing: Supervision/safety Where Assessed-Upper Body Dressing:  (toilet) Lower Body Dressing: Moderate assistance Where Assessed-Lower Body Dressing:  (toilet) Toileting: Contact guard Where Assessed-Toileting: Teacher, adult education: Close supervision Toilet Transfer Method: Surveyor, minerals: Grab bars   Therapy/Group: Individual Therapy  Glendal Cassaday 01/25/2021, 12:32 PM

## 2021-01-25 NOTE — Progress Notes (Signed)
Physical Therapy Session Note  Patient Details  Name: Jane Navarro MRN: [id number] Date of Birth: 1945/11/12  Today's Date: 01/25/2021 PT Individual Time: 1300-1410 PT Individual Time Calculation (min): 70 min   Short Term Goals: Week 1:  PT Short Term Goal 1 (Week 1): Pt will ascend/descend 4 6" steps w/BUE support and min A PT Short Term Goal 1 - Progress (Week 1): Progressing toward goal PT Short Term Goal 2 (Week 1): Pt will ambulate 25' w/LRAD and min A PT Short Term Goal 2 - Progress (Week 1): Met PT Short Term Goal 3 (Week 1): Pt will self-propel 50' in [facility] w/CGA PT Short Term Goal 3 - Progress (Week 1): Progressing toward goal Week 2:  PT Short Term Goal 1 (Week 2): STG = LTG due to LOS  Skilled Therapeutic Interventions/Progress Updates:  Pt received seated in recliner in room, denied pain and was agreeable to PT. Emphasis of session on stair training, transfers and improved activity tolerance to prepare for DC home this week. Pt performed sit <>stands throughout session w/S* and RW. Pt requested to void and ambulated 15' to bathroom w/RW and S*. Pt doffed pants w/S*, voided continently and performed peri care w/set-up assist. Pt donned pants w/S* and ambulated 10' to sink to perform hand hygiene while standing w/S*. Pt transported to main gym w/total A in [facility] for time management.   Stair Training -Pt ascended/descended 4 6" steps using forward-facing technique, RUE support on R rail and step-to pattern w/CGA. Provided min verbal cues for BLE sequencing and encouragement, as pt very anxious about performing stairs.  -Pt then ascended/descended 4 6" steps using lateral technique w/R handrail only and S*. Provided max visual cues for performing task prior to pt practicing, emphasis on foot placement on step. Noted pt more confident navigating stairs using lateral technique and did not require additional cues while performing task.   Pt transported to ortho gym w/total A for time  management and ascended/descended ramp w/RW and S*. Pt very anxious to perform task, provided min verbal cues for diaphragmatic breathing throughout for anxiety reduction. Pt required min verbal cues to maintain safe distance to RW while descending ramp.   Pt ambulated 175' w/RW and S*, min verbal cues to avoid looking down at feet to facilitate upright posture and safe scanning of environment. Pt transported remainder of way back to room w/total A due to fatigue. Pt performed sit <>stand pivot from WC to recliner and was left seated in recliner, all needs in reach.   Therapy Documentation Precautions:  Precautions Precautions: Fall Required Braces or Orthoses: Other Brace Other Brace: pt wears neoprene type sleeve on B knees due to arthritic pain Restrictions Weight Bearing Restrictions: No RLE Weight Bearing: Non weight bearing LLE Weight Bearing: Non weight bearing   Therapy/Group: Individual Therapy Cruzita Lederer Bard Haupert, PT, DPT  01/25/2021, 7:52 AM

## 2021-01-25 NOTE — Progress Notes (Signed)
Patient ID: Jane Navarro, female   DOB: [birth year], 75 y.o.   MRN: [id number] Spoke with Ollen Barges from [facility] who can accept pt's home health referral. Have made daughter aware, this is the agency she has chosen.

## 2021-01-26 DIAGNOSIS — M25561 Pain in right knee: Secondary | ICD-10-CM

## 2021-01-26 DIAGNOSIS — I2510 Atherosclerotic heart disease of native coronary artery without angina pectoris: Secondary | ICD-10-CM

## 2021-01-26 MED ORDER — MAGNESIUM HYDROXIDE 400 MG/5ML PO SUSP: 30.0000 mL | Freq: Every day | ORAL | Status: DC | PRN

## 2021-01-26 MED ORDER — ACETAMINOPHEN 325 MG PO TABS: 325.0000 mg | ORAL_TABLET | ORAL | Status: AC | PRN

## 2021-01-26 MED ORDER — DIGOXIN 62.5 MCG PO TABS: 0.0625 mg | ORAL_TABLET | Freq: Every day | ORAL | 0 refills | Status: DC

## 2021-01-26 MED ORDER — OXYCODONE-ACETAMINOPHEN 5-325 MG PO TABS: 1.0000 | ORAL_TABLET | Freq: Every day | ORAL | 0 refills | Status: AC | PRN

## 2021-01-26 MED ORDER — LEVOTHYROXINE SODIUM 100 MCG PO TABS: 100.0000 ug | ORAL_TABLET | Freq: Every day | ORAL | 3 refills | Status: AC

## 2021-01-26 MED ORDER — ATORVASTATIN CALCIUM 80 MG PO TABS: 80.0000 mg | ORAL_TABLET | Freq: Every day | ORAL | 3 refills | Status: AC

## 2021-01-26 MED ORDER — DOCUSATE SODIUM 100 MG PO CAPS: 200.0000 mg | ORAL_CAPSULE | Freq: Every day | ORAL | 0 refills | Status: AC

## 2021-01-26 MED ORDER — TICAGRELOR 90 MG PO TABS: 90.0000 mg | ORAL_TABLET | Freq: Two times a day (BID) | ORAL | 0 refills | Status: AC

## 2021-01-26 MED ORDER — POLYETHYLENE GLYCOL 3350 17 G PO PACK: 17.0000 g | PACK | Freq: Every day | ORAL | 0 refills | Status: AC | PRN

## 2021-01-26 MED ORDER — EZETIMIBE 10 MG PO TABS: 10.0000 mg | ORAL_TABLET | Freq: Every day | ORAL | 0 refills | Status: AC

## 2021-01-26 NOTE — Progress Notes (Signed)
PROGRESS NOTE   Subjective/Complaints:  Pt reports doing "great' wlaking with no drop sin BP- no dizziness.  Loose stools still- will d/c /make prn the milk ot mg.  No LE edema per pt.   ROS:   Pt denies SOB, abd pain, CP, N/V/C/D, and vision changes   Objective:   No results found. Recent Labs    01/25/21 1240  WBC 5.2  HGB 10.0*  HCT 32.5*  PLT 253    Recent Labs    01/25/21 1240  NA 137  K 3.8  CL 102  CO2 28  GLUCOSE 85  BUN 13  CREATININE 0.71  CALCIUM 8.6*    Intake/Output Summary (Last 24 hours) at 01/26/2021 1036 Last data filed at 01/26/2021 0903 Gross per 24 hour  Intake 840 ml  Output --  Net 840 ml        Physical Exam: Vital Signs Blood pressure (!) 149/73, pulse 72, temperature 97.9 F (36.6 C), temperature source Oral, resp. rate 14, height 5' (1.524 m), weight 58 kg, SpO2 95 %.     General: awake, alert, appropriate, sitting up in bed; nurse in room; NAD HENT: conjugate gaze; oropharynx moist- missing a few teeth;  CV: regular rate; no JVD Pulmonary: CTA B/L; no W/R/R- good air movement GI: soft, NT, ND, (+)BS Psychiatric: appropriate Neurological: Ox3  Skin: Warm and dry.  Intact. L groin healed completely- R groin healed over- a little puffy-no need for bandage except slightly moist. No LE edema at all.   Psych: Normal mood.  Normal behavior. Musc: No edema in extremities.  No tenderness in extremities. Neuro: Alert and oriented Motor: 4/5 throughout, stable  Assessment/Plan: 1. Functional deficits which require 3+ hours per day of interdisciplinary therapy in a comprehensive inpatient rehab setting. Physiatrist is providing close team supervision and 24 hour management of active medical problems listed below. Physiatrist and rehab team continue to assess barriers to discharge/monitor patient progress toward functional and medical goals  Care Tool:  Bathing     Body parts bathed by patient: Right arm, Left arm, Chest, Abdomen, Right upper leg, Face, Front perineal area, Left upper leg, Right lower leg, Left lower leg, Buttocks   Body parts bathed by helper: Buttocks     Bathing assist Assist Level: Minimal Assistance - Patient > 75% (Posterior peri hygiene standing with grab bars)     Upper Body Dressing/Undressing Upper body dressing   What is the patient wearing?: Dress Orthosis activity level: Performed by helper  Upper body assist Assist Level: Set up assist    Lower Body Dressing/Undressing Lower body dressing      What is the patient wearing?: Pants     Lower body assist Assist for lower body dressing: Contact Guard/Touching assist     Toileting Toileting    Toileting assist Assist for toileting: Supervision/Verbal cueing     Transfers Chair/bed transfer  Transfers assist     Chair/bed transfer assist level: Supervision/Verbal cueing     Locomotion Ambulation   Ambulation assist   Ambulation activity did not occur: Safety/medical concerns (Fatigue, syncope)  Assist level: Supervision/Verbal cueing Assistive device: Walker-rolling Max distance: 12'   Walk 10 feet  activity   Assist  Walk 10 feet activity did not occur: Safety/medical concerns (Fatigue, syncope)  Assist level: Supervision/Verbal cueing Assistive device: Walker-rolling   Walk 50 feet activity   Assist Walk 50 feet with 2 turns activity did not occur: Safety/medical concerns (Fatigue, syncope)  Assist level: Supervision/Verbal cueing Assistive device: Walker-rolling    Walk 150 feet activity   Assist Walk 150 feet activity did not occur: Safety/medical concerns (Fatigue, syncope)  Assist level: Supervision/Verbal cueing Assistive device: Walker-rolling    Walk 10 feet on uneven surface  activity   Assist Walk 10 feet on uneven surfaces activity did not occur: Safety/medical concerns (Fatigue, syncope)   Assist level:  Supervision/Verbal cueing Assistive device: Walker-rolling   Wheelchair     Assist Is the patient using a wheelchair?: Yes Type of Wheelchair: Manual Wheelchair activity did not occur: Safety/medical concerns (Fatigue, syncope)         Wheelchair 50 feet with 2 turns activity    Assist    Wheelchair 50 feet with 2 turns activity did not occur: Safety/medical concerns (Fatigue, syncope)       Wheelchair 150 feet activity     Assist  Wheelchair 150 feet activity did not occur: Safety/medical concerns (Fatigue, syncope)       Blood pressure (!) 149/73, pulse 72, temperature 97.9 F (36.6 C), temperature source Oral, resp. rate 14, height 5' (1.524 m), weight 58 kg, SpO2 95 %.  Medical Problem List and Plan: 1.  Cardiopulmonary debility s/p NSTEMI, CHF Con't PT and OT- team conference today to verify d/c on Thursday 10/27 2.  Impaired mobility: Continue Lovenox             -antiplatelet therapy: Continue ASA/Brillinta 3. Postoperative pain: D/c percocet. Provided with pain relief journal  Monitor increased exertion 4. Mood: LCSW to follow for evaluation and support.              -antipsychotic agents: N/a 5. Neuropsych: This patient is capable of making decisions on her own behalf. 6. Skin/Wound Care: Routine pressure relief measures.   Santyl to wound  10/25- d/c Santyl- doesn't need anymore- maybe a 4x4 for moisture 7. Fluids/Electrolytes/Nutrition: Monitor I/Os 8. NSTEMI/CAD s/p PCI/stent: Monitor for symptoms with increase in activity.        9. Acute systolic HF: Heart healthy diet. Check daily weights--admit wt 141 lbs. Provided dietary counseling --Monitor for symptoms of overload.  --Holding spironolactone and Cozaar given hypotension, Continue Digoxin, ASA/Brillinta and Lipitor  Filed Weights   01/24/21 0346 01/25/21 0441 01/26/21 0500  Weight: 58.5 kg 58.1 kg 58 kg   10/25- weight stable- no LE edema seen- con't regimen 10. Hypothyroid: On  supplement for past month.  TSH ordered per cards             --Improving from 41.4-->4.8 in three weeks.  11. AKI: Resolved 12. Shocked liver:   ALT elevated, but improving on 10/11  13. Acute blood loss anemia: Groin hematomas appear resolved.  Hemoglobin 9.6 on 10/16, labs ordered for Monday  10/24- Hb 10.0- con't to monitor 14. Low albumin levels: Continue Ensure BID--family encouraged to bring low salt foods from home. 15. Bilateral Knee pain: Voltaren gel added to help manage symptoms.   Controlled with voltaren on 10/19 16. Constipation: Increased senna to bid (does not like daily miralax-->d/c and continue MOM  17. Orthostasis w/syncope:              --change to THT and order abdominal binder.              --  encourage oral intake.   Orthostatics negative on 10/13, however patient with symptoms consistent with orthostasis  Increased midodrine 5mg  TID  10/24- doing much bette-r walking some- con't regimen  10/25- no dizziness in last 48 hours- con't regimen  Improving 18. Hyponatremia: Resolved  Na 137 on 10/21  Continue to monitor 19. Hypokalemia  K+ 4.1 on 10/21 after supplementation, labs ordered for Monday  10/24- K+ 3.8- will con't to monitor  LOS: 15 days A FACE TO FACE EVALUATION WAS PERFORMED  Aviel Davalos 01/26/2021, 10:36 AM

## 2021-01-26 NOTE — Patient Care Conference (Signed)
Inpatient RehabilitationTeam Conference and Plan of Care Update Date: 01/26/2021   Time: 11:58 AM    Patient Name: Jane Navarro      Medical Record Number: [id number]  Date of Birth: April 01, 1946 Sex: Female         Room/Bed: 5C06C/5C06C-01 Payor Info: Payor: [employer] MEDICARE / Plan: [employer] MEDICARE HMO / Product Type: *No Product type* /    Admit Date/Time:  01/11/2021  4:09 PM  Primary Diagnosis:  Debility  Hospital Problems: Principal Problem:   Debility Active Problems:   Orthostasis   Acute blood loss anemia   Acute systolic congestive heart failure (HCC)   Postoperative pain   Hyponatremia   Hypokalemia    Expected Discharge Date: Expected Discharge Date: 01/28/21  Team Members Present: Physician leading conference: Dr. Genice Rouge Social Worker Present: Dossie Der, LCSW Nurse Present: Chana Bode, RN PT Present: Ernestene Kiel, PT OT Present: Ezra Sites, OT SLP Present: Eilene Ghazi, SLP     Current Status/Progress Goal Weekly Team Focus  Bowel/Bladder   Continent of bowel and bladder. LBM: 10/24  maintain continence  Routine toiletting - as needed   Swallow/Nutrition/ Hydration             ADL's   need AE to reach feet for donning socks and washing feet, otherwise is close S.  Light CGA to S with ambulation in the room,  very minimal to no c/o light headedness  supervision  ADL training, endurance training, pt/fam education   Mobility   S* transfers, gait up to 220' w/RW, mod I bed mobility, S* stairs  S*-mod I  Activity tolerance, pt/fam edu, endurance   Communication             Safety/Cognition/ Behavioral Observations            Pain   Patient denies pain or discomfort  Pain will remain under control, less than 3/10.  Assess for pain Q shift and as needed   Skin   MASD to R groin  Maintain skin integrity, no new issues  Assess skin Q shift and as needed     Discharge Planning:  Daughter to be here tomorrow for education, paln to go to  son;s home with some hired assist-daughter still concerned regarding BP issues   Team Discussion: Doing well overall; edema better and right groin wound healing with daily dressing changes. MOM changed to prn. Activity tolerance improved and has not had a syncopal episode or dizziness spell in a while.  Patient on target to meet rehab goals: yes, able to manage steps with mod I using a RW.   *See Care Plan and progress notes for long and short-term goals.   Revisions to Treatment Plan:  None  Teaching Needs: Safety, transfers, toileting, energy conservation, medication, secondary risk management, etc  Current Barriers to Discharge: Decreased caregiver support and Home enviroment access/layout  Possible Resolutions to Barriers: Family education set up for 10/26 with daughter; reviewed with son HH follow up services recommended Has DME recommended     Medical Summary Current Status: NO orthostatic hypotension; pain meds prn; continent B/B  Barriers to Discharge: Home enviroment access/layout;Medical stability;Wound care;Other (comments)  Barriers to Discharge Comments: family ed tomorrow- wounds almost healed on groin Possible Resolutions to [employer] Focus: d/c santyl- R groin has healed; L groin healed- weight has reduced/stable- walking >250ft mod I RW- d/c 10/27   Continued Need for Acute Rehabilitation Level of Care: The patient requires daily medical management by a physician with  specialized training in physical medicine and rehabilitation for the following reasons: Direction of a multidisciplinary physical rehabilitation program to maximize functional independence : Yes Medical management of patient stability for increased activity during participation in an intensive rehabilitation regime.: Yes Analysis of laboratory values and/or radiology reports with any subsequent need for medication adjustment and/or medical intervention. : Yes   I attest that I was present, lead  the team conference, and concur with the assessment and plan of the team.   Chana Bode B 01/26/2021, 1:51 PM

## 2021-01-26 NOTE — Progress Notes (Signed)
Occupational Therapy Session Note  Patient Details  Name: Jane Navarro MRN: [id number] Date of Birth: Jul 22, 1945  Today's Date: 01/26/2021 OT Individual Time: 9983-3825 OT Individual Time Calculation (min): 75 min    Short Term Goals: Week 2:  OT Short Term Goal 1 (Week 2): Pt will be able to tolerate standing for 3 minutes to be able to brush teeth at sink. OT Short Term Goal 2 (Week 2): Pt will complete 3/3 steps of toileting with supervision. OT Short Term Goal 3 (Week 2): Pt will be able to don underwear and socks over feet with S.  Skilled Therapeutic Interventions/Progress Updates:    Pt received in bed and declined a bath today.  She worked on applying lotion to her legs reaching to feet in a semi supine position. Therapist applied TEDS and then pt donned socks.  Pt sat to EOB and then stood to remove brief which she no longer uses during the day.  She donned pants over feet and stood to pull up over hips, then ambulated to sink to brush teeth. Sat briefly to rest and then walked to toilet to urinate, pt cleansed and managed clothing without A.   She continues to need cues to not over use UE on RW as she tends to clench handles and elevate shoulders. Pt cued to "glide" walker.  Pt then ambulated with close S with RW over 100 feet x 2 with improved posture and less pressure on walker handles.  Sat in recliner to work on UE and LE AROM exercises she can do at home and red band arm extensions for upper back.    Pt resting in recliner with all needs met.   Therapy Documentation Precautions:  Precautions Precautions: Fall Required Braces or Orthoses: Other Brace Other Brace: pt wears neoprene type sleeve on B knees due to arthritic pain Restrictions Weight Bearing Restrictions: No    Vital Signs: Therapy Vitals Pulse Rate: 72 Pain: Pain Assessment Pain Score: 0-No pain ADL: ADL Eating: Independent Grooming: Supervision/safety Where Assessed-Grooming: Standing at  sink Upper Body Bathing: Supervision/safety Where Assessed-Upper Body Bathing: Edge of bed, Shower Lower Body Bathing: Supervision/safety Where Assessed-Lower Body Bathing: Edge of bed, Shower Upper Body Dressing: Supervision/safety Where Assessed-Upper Body Dressing: Edge of bed Lower Body Dressing: Supervision/safety, Setup Where Assessed-Lower Body Dressing: Edge of bed Toileting: Supervision/safety Where Assessed-Toileting: Glass blower/designer: Close supervision Armed forces technical officer Method: Counselling psychologist: Energy manager: Close supervision Social research officer, government Method: Heritage manager: Radio broadcast assistant, Grab bars   Therapy/Group: Individual Therapy  [place] 01/26/2021, 9:18 AM

## 2021-01-26 NOTE — Progress Notes (Signed)
Physical Therapy Weekly Progress Note  Patient Details  Name: Jane Navarro MRN: [id number] Date of Birth: 19-Feb-1946  Beginning of progress report period: January 12, 2021 End of progress report period: January 26, 2021  Today's Date: 01/26/2021 PT Individual Time: 1330-1445 PT Individual Time Calculation (min): 75 min   Patient's short term goals equal her long term goals due to extended LOS. Pt is on track to meet all of her long term goals due to improved cardiovascular endurance, strength and activity tolerance. She is ambulating up to 250' w/RW and mod I and can ascend/descend 4 six inch steps w/R handrail and S*. Pt's children have been greatly involved in her care and family training is scheduled for tomorrow.   Patient continues to demonstrate the following deficits decreased cardiorespiratoy endurance and therefore will continue to benefit from skilled PT intervention to increase functional independence with mobility.  Patient progressing toward long term goals..  Continue plan of care.  PT Short Term Goals Week 1:  PT Short Term Goal 1 (Week 1): Pt will ascend/descend 4 6" steps w/BUE support and min A PT Short Term Goal 1 - Progress (Week 1): Progressing toward goal PT Short Term Goal 2 (Week 1): Pt will ambulate 25' w/LRAD and min A PT Short Term Goal 2 - Progress (Week 1): Met PT Short Term Goal 3 (Week 1): Pt will self-propel 50' in [facility] w/CGA PT Short Term Goal 3 - Progress (Week 1): Progressing toward goal Week 2:  PT Short Term Goal 1 (Week 2): STG = LTG due to LOS PT Short Term Goal 1 - Progress (Week 2): Met Week 3:  PT Short Term Goal 1 (Week 3): STG = LTG due to extended LOS  Skilled Therapeutic Interventions/Progress Updates:  Pt received seated in recliner in room, denied pain and agreeable to PT. Emphasis of session on gait on uneven surfaces for preparation to DC home. Pt performed sit <>stands throughout session from WC/BSC/recliner mod I w/RW. Pt ambulated 15'  to [facility] from recliner mod I w/RW, doffed pants w/S* and voided continently. Performed peri care w/set-up assist and donned pants w/S*. Pt ambulated 10' to sink w/RW and performed hand hygiene w/S*. Pt transported to reflection fountain w/total A for time management.   Gait Training  -Pt ambulated 90' and 250' on sidewalk while navigating turns/unlevel terrain and weaving in/out of picnic tables mod I w/RW. Pt able to self-correct posture and adjust distance to RW without cues from therapist by telling herself to "glide". Noted improved step clearance bilaterally, step length and decreased gait speed.   Pt transported back to room w/total A for time management and ambulated 10' to [facility] mod I w/RW, doffed pants w/S* and voided continently. Performed peri-care w/set-up assist and donned pants w/S*. Pt ambulated 10' to sink to perform hand hygiene followed by ambulating 10' to recliner w/RW mod I. Pt was left seated in recliner in room, all needs in reach.   Therapy Documentation Precautions:  Precautions Precautions: Fall Required Braces or Orthoses: Other Brace Other Brace: pt wears neoprene type sleeve on B knees due to arthritic pain Restrictions Weight Bearing Restrictions: No RLE Weight Bearing: Non weight bearing LLE Weight Bearing: Non weight bearing   Therapy/Group: Individual Therapy  Cruzita Lederer Jayli Fogleman, PT, DPT 01/26/2021, 7:39 AM

## 2021-01-26 NOTE — Discharge Summary (Signed)
Physician Discharge Summary  Patient ID: Jane Navarro MRN: [id number] DOB/AGE: [birth year] 75 y.o.  Admit date: 01/11/2021 Discharge date: 01/28/2021  Discharge Diagnoses:  Principal Problem:   Debility Active Problems:   Transaminitis   Orthostasis   Acute blood loss anemia   Postoperative pain   Knee pain, bilateral   CAD, multiple vessel   Discharged Condition: stable  Significant Diagnostic Studies: [place] EKG SITE RITE  Result Date: 01/06/2021 If Site Rite image not attached, placement could not be confirmed due to current cardiac rhythm.   Labs:  Basic Metabolic Panel: Recent Labs  Lab 01/20/21 0545 01/21/21 0500 01/22/21 0516 01/25/21 1240  NA 132* 140 137 137  K 3.3* 4.5 4.1 3.8  CL 103 109 108 102  CO2 _0 GLUCOSE 84 75 81 85  BUN _1 CREATININE 0.81 0.75 0.63 0.71  CALCIUM 8.2* 8.9 8.7* 8.6*  MG  --  2.0  --   --     CBC: CBC Latest Ref Rng & Units 01/25/2021 01/17/2021 01/16/2021  WBC 4.0 - 10.5 K/uL 5.2 5.2 5.3  Hemoglobin 12.0 - 15.0 g/dL 10.0(L) 9.6(L) 9.3(L)  Hematocrit 36.0 - 46.0 % 32.5(L) 30.6(L) 29.7(L)  Platelets 150 - 400 K/uL 253 308 326    Hepatic Function Latest Ref Rng & Units 01/12/2021 01/11/2021 01/10/2021  Total Protein 6.5 - 8.1 g/dL 4.7(L) 5.0(L) 4.8(L)  Albumin 3.5 - 5.0 g/dL 1.9(L) 2.0(L) 1.9(L)  AST 15 - 41 U/L 32 39 32  ALT 0 - 44 U/L 51(H) 61(H) 64(H)  Alk Phosphatase 38 - 126 U/L 71 79 80  Total Bilirubin 0.3 - 1.2 mg/dL 1.2 1.1 1.1  Bilirubin, Direct 0.0 - 0.2 mg/dL - - -     CBG: No results for input(s): GLUCAP in the last 168 hours.  Brief HPI:   Jane Navarro is a 75 y.o. female with history of CVA, vertigo, OA bilateral knees, recent admission for encephalopathy due to hypothyroidism who was readmitted on 12/28/2020 with progressive decline since DC from different healthcare 3 days prior to admission.  She was found to be hypotensive with new LBBB, elevated troponin due to NSTEMI, UTI and AKI.  She  was treated with IV antibiotics and 2D echo done showing new reduction in EF to 25 to 30% with moderate to severe TVR as well as small hypermobility element in LV.  She underwent emergent cardiac catheterization by Dr. Martinique revealing severe left main and multivessel disease in proximal LAD and mid LCx.  Medical treatment was recommended as patient felt to be a poor surgical candidate.  She required milrinone as well as Impella placement by Dr. Haroldine Laws for management of cardiogenic shock.    She underwent PCI of RCA with 3 overlapping stents by Dr. Johnsie Cancel on 09/30 with plans for LAD intervention in future.  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  Impella was removed on 10/04 and milrinone weaned off however she continued to have issues with orthostasis changes. Therapy was ongoing and she continued to be limited by weakness with shuffling gait, balance deficits posterior lean, fatigue as well as episodic syncope.  CIR was recommended due to functional decline.    Hospital Course: Jane Navarro was admitted to rehab 01/11/2021 for inpatient therapies to consist of PT and OT at least three hours five days a week. Past admission  physiatrist, therapy team and rehab RN have worked together to provide customized collaborative inpatient rehab.  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  Her blood pressures were monitored on TID basis and fatigue and weakness continued to be a limiting factor.  Cardiology has been following for input and midodrine was added for BP support.  As she continued to have orthostatic dizziness, spironolactone and losartan were discontinued and digoxin was decreased to 0.0625 mg daily.  Follow-up CBC showed H&H to be slowly improving and thrombocytopenia had  resolved.  She was maintained on aspirin and ticagrelor during his stay. Abnormal LFTs were resolving and follow up check of BMET showed that hyponatremia has resolved. As blood pressures started improving, she started making good gains and was able to tolerate increase in activity without any cardiac symptoms. She has progressed to modified independent level and supervision is recommended for safety. She will continue to receive follow up  HHPT, [place] and HHRN by [facility] after discharge.   Rehab course: During patient's stay in rehab weekly team conferences were held to monitor patient's progress, set goals and discuss barriers to discharge. At admission, patient required mod assist with ADL tasks and min assist with mobility. She has had improvement in activity tolerance, balance, postural control as well as ability to compensate for deficits.  She is independent for transfers and is ambulating 250' with use of RW. Family education was completed.    Discharge disposition: 01-Home or Self Care  Diet: Heart Healthy.   Special Instructions: No driving or strenuous activity till cleared by MD. 2. Follow up with Dr. Jenny Reichmann Rommel/Heart Failure clinic in [place], [place].    Allergies as of 01/28/2021       Reactions   Ciprofloxacin    Erythromycin    Penicillins    Ampicillin Rash   Mostly topical per daughter        Medication List     STOP taking these medications    digoxin 0.125 MG tablet Commonly known as: LANOXIN   losartan 25 MG tablet Commonly known as: COZAAR   spironolactone 25 MG tablet Commonly known as: ALDACTONE       TAKE these medications    acetaminophen 325 MG tablet Commonly known as: TYLENOL Take 1-2 tablets (325-650 mg total) by mouth every 4 (four) hours as needed for mild pain.   aspirin 81 MG EC tablet Take 1 tablet (81 mg total) by mouth daily. Swallow whole.   atorvastatin 80 MG tablet Commonly known as: LIPITOR Take 1 tablet (80 mg  total) by mouth daily.   docusate sodium 100 MG capsule Commonly known as: COLACE Take 2 capsules (200 mg total) by mouth daily.   ezetimibe 10 MG tablet Commonly known as: ZETIA Take 1 tablet (10 mg total) by mouth daily.   levothyroxine 100 MCG tablet Commonly known as: SYNTHROID Take 1 tablet (100 mcg total) by mouth daily at 6 (six) AM.   midodrine 5 MG tablet Commonly known as: PROAMATINE Take 1 tablet (5 mg total) by mouth 3 (three) times daily with meals.   oxyCODONE-acetaminophen 5-325 MG tablet Commonly known as: PERCOCET/ROXICET Take 1 tablet by mouth daily as needed for severe pain.   polyethylene glycol 17 g packet Commonly known as: MIRALAX / GLYCOLAX Take 17 g by mouth daily as needed for mild constipation. What changed:  when to take this reasons to take  this   ticagrelor 90 MG Tabs tablet Commonly known as: BRILINTA Take 1 tablet (90 mg total) by mouth 2 (two) times daily.        Follow-up Information     Elouise Munroe, MD. Schedule an appointment as soon as possible for a visit.   Specialties: Cardiology, Radiology Why: for cardiac follow up Contact information: [place] [address] [phone]         Izora Ribas, MD Follow up.   Specialty: Physical Medicine and Rehabilitation Why: 11/1 please arrive at 1:00pm for 1:20pm appointment, thank you! Contact information: [address] [place] [address] [phone]                 Signed: Bary Leriche 02/01/2021, 4:15 PM

## 2021-01-26 NOTE — Progress Notes (Signed)
Patient ID: Jane Navarro, female   DOB: Feb 18, 1946, 75 y.o.   MRN: [id number] Met with pt and spoke with daughter via telephone to give team conference update and progress pt has made this week. Her dizziness is much better and is doing very well. Reaching supervision-mod/I level and will be ready for discharge 10/27. Daughter to be here tomorrow for education 9-11 and then will go to pt's home to pack up and come back on Thursday form the brother's home to transport back to his home. Pt has all her needed equipment form previous admits. [facility] in [place] for [place], has order. See daughter tomorrow for questions.

## 2021-01-26 NOTE — Progress Notes (Signed)
Physical Therapy Discharge Summary  Patient Details  Name: Jane Navarro MRN: [id number] Date of Birth: June 04, 1945   Patient has met 10 of 10 long term goals due to improved activity tolerance, increased strength, and improved awareness.  Patient to discharge at an ambulatory level Modified Independent.   Patient's care partner  is confident in his ability  to provide the necessary physical assistance at discharge. Pt's daughter will be staying at home w/pt for 3 weeks after DC to assist as well.   Reasons goals not met: NA  Recommendation:  Patient will benefit from ongoing skilled PT services in home health setting to continue to advance safe functional mobility, address ongoing impairments in global deconditioning, decreased cardiovascular endurance and minimize fall risk.  Equipment: No equipment provided  Reasons for discharge: treatment goals met and discharge from hospital  Patient/family agrees with progress made and goals achieved: Yes  PT Discharge Precautions/Restrictions Precautions Precautions: Fall Restrictions Weight Bearing Restrictions: No Pain Interference Pain Interference Pain Effect on Sleep: 1. Rarely or not at all Pain Interference with Therapy Activities: 1. Rarely or not at all Pain Interference with Day-to-Day Activities: 1. Rarely or not at all Vision/Perception  Vision - History Ability to See in Adequate Light: 0 Adequate Perception Perception: Within Functional Limits Praxis Praxis: Intact  Cognition Overall Cognitive Status: Within Functional Limits for tasks assessed Arousal/Alertness: Awake/alert Safety/Judgment: Appears intact Sensation Sensation Light Touch: Appears Intact Coordination Gross Motor Movements are Fluid and Coordinated: No Coordination and Movement Description: general weakness from deconditioning Finger Nose Finger Test: [facility] Heel Shin Test: [facility] Motor  Motor Motor: Within Functional Limits Motor - Discharge  Observations: Global deconditioning  Mobility Bed Mobility Bed Mobility: Rolling Right;Rolling Left;Supine to Sit;Sitting - Scoot to [employer] of Bed;Sit to Supine;Scooting to [facility] Rolling Right: Independent with assistive device Rolling Left: Independent with assistive device Supine to Sit: Independent with assistive device Sitting - Scoot to Edge of Bed: Independent with assistive device Sit to Supine: Independent with assistive device Scooting to [facility]: Independent with assistive device Transfers Transfers: Sit to Stand;Stand to Sit;Stand Pivot Transfers Sit to Stand: Independent with assistive device Stand to Sit: Independent with assistive device Stand Pivot Transfers: Independent with assistive device Transfer (Assistive device): Rolling walker Locomotion  Gait Ambulation: Yes Gait Assistance: Independent with assistive device Gait Distance (Feet): 250 Feet Assistive device: Rolling walker Gait Gait: Yes Gait Pattern: Impaired Gait Pattern: Decreased step length - left;Decreased step length - right;Trunk flexed;Poor foot clearance - right;Poor foot clearance - left Stairs / Additional Locomotion Stairs: Yes Stairs Assistance: Supervision/Verbal cueing Stair Management Technique: One rail Right Number of Stairs: 4 Height of Stairs: 6 Ramp: Supervision/Verbal cueing Curb: Supervision/Verbal cueing Wheelchair Mobility Wheelchair Mobility: No  Trunk/Postural Assessment  Cervical Assessment Cervical Assessment: Exceptions to [facility] (Forward head) Thoracic Assessment Thoracic Assessment: Exceptions to [facility] (Kyphotic) Lumbar Assessment Lumbar Assessment: Exceptions to [facility] (Posterior pelvic tilt) Postural Control Postural Control: Within Functional Limits  Balance Balance Balance Assessed: Yes Static Sitting Balance Static Sitting - Balance Support: Feet supported;Bilateral upper extremity supported Static Sitting - Level of Assistance: 7: Independent Dynamic Sitting  Balance Dynamic Sitting - Balance Support: During functional activity;Feet supported Dynamic Sitting - Level of Assistance: 6: Modified independent (Device/Increase time) Dynamic Sitting - Balance Activities: Lateral lean/weight shifting;Reaching across midline;Reaching for objects;Forward lean/weight shifting Static Standing Balance Static Standing - Balance Support: During functional activity;Bilateral upper extremity supported Static Standing - Level of Assistance: 6: Modified independent (Device/Increase time) Dynamic Standing Balance Dynamic Standing - Balance  Support: Bilateral upper extremity supported;During functional activity Dynamic Standing - Level of Assistance: 6: Modified independent (Device/Increase time) Dynamic Standing - Balance Activities: Lateral lean/weight shifting;Forward lean/weight shifting;Reaching across midline Extremity Assessment  RLE Assessment RLE Assessment: Exceptions to [facility] RLE Strength RLE Overall Strength: Within Functional Limits for tasks assessed Right Hip Flexion: 4+/5 Right Hip Extension: 4+/5 Right Hip ABduction: 4+/5 Right Hip ADduction: 4+/5 Right Knee Flexion: 4+/5 Right Knee Extension: 4+/5 Right Ankle Dorsiflexion: 4+/5 Right Ankle Plantar Flexion: 4+/5 LLE Assessment LLE Assessment: Exceptions to [facility] LLE Strength LLE Overall Strength: Within Functional Limits for tasks assessed Left Hip Flexion: 4+/5 Left Hip Extension: 4+/5 Left Hip ABduction: 4+/5 Left Hip ADduction: 4+/5 Left Knee Flexion: 4+/5 Left Knee Extension: 4+/5 Left Ankle Dorsiflexion: 4+/5 Left Ankle Plantar Flexion: 4+/5   Jannah E Plaster, PT, DPT 01/27/2021, 4:22 PM

## 2021-01-27 DIAGNOSIS — R748 Abnormal levels of other serum enzymes: Secondary | ICD-10-CM

## 2021-01-27 MED ORDER — MIDODRINE HCL 5 MG PO TABS: 5.0000 mg | ORAL_TABLET | Freq: Three times a day (TID) | ORAL | 0 refills | Status: AC

## 2021-01-27 NOTE — Progress Notes (Signed)
Physical Therapy Session Note  Patient Details  Name: Jane Navarro MRN: [id number] Date of Birth: July 02, 1945  Today's Date: 01/27/2021 PT Individual Time: 1003-1059 PT Individual Time Calculation (min): 56 min   Short Term Goals: Week 3:  PT Short Term Goal 1 (Week 3): STG = LTG due to extended LOS  Skilled Therapeutic Interventions/Progress Updates:     Pt received seated in recliner and agrees to therapy. No complaint of pain. Daughter present for family education. Pt performs sit to stand and stand step transfer to [facility] with RW and mod(I). WC transport to gym for time management. Pt performs car transfer following PT demonstration with supervision and use of RW. Following, pt performs ramp navigation with RW and cues to increase step height to decrease risk for falls, and increasing stride length and gait speed for improved balance and energy conservation. Following seated rest break, pt completes x12 6" steps with R hand rail, going up laterally, with cues for step sequencing and then for pursed lip breathing following activity due to increased work of breathing noted in patient. Pt then ambulates x150' with RW and challenge of increasing gait speed and stride length. Pt demonstrates much improved gait pattern and is educated on importance of maintaining this gait pattern to strengthen legs and prevent functional decline. Pt verbalizes understanding. Left seated in recliner with alarm intact and all needs within reach.  Therapy Documentation Precautions:  Precautions Precautions: Fall Required Braces or Orthoses: Other Brace Other Brace: pt wears neoprene type sleeve on B knees due to arthritic pain Restrictions Weight Bearing Restrictions: No RLE Weight Bearing: Non weight bearing LLE Weight Bearing: Non weight bearing Other Position/Activity Restrictions: WBAT BLE   Therapy/Group: Individual Therapy  Beau Fanny, PT, DPT 01/27/2021, 4:15 PM

## 2021-01-27 NOTE — Progress Notes (Signed)
Inpatient Rehabilitation Care Coordinator Discharge Note   Patient Details  Name: Jane Navarro MRN: [id number] Date of Birth: [birth year]   Discharge location: HOME TO SON'S IN SUPPLY Jane Navarro  Length of Stay:  17 DAYS  Discharge activity level: MOD/I-SUPERVISION LEVEL  Home/community participation: ACTIVE  Patient response GD:JMEQAS Literacy - How often do you need to have someone help you when you read instructions, pamphlets, or other written material from your doctor or pharmacy?: Never  Patient response TM:HDQQIW Isolation - How often do you feel lonely or isolated from those around you?: Never  Services provided included: MD, RD, PT, OT, RN, CM, TR, Pharmacy, Neuropsych, SW  Financial Services:  Field seismologist Utilized: Scientific laboratory technician MEDICARE  Choices offered to/list presented to: PT AND DAUGHTER  Follow-up services arranged:  Home Health, Patient/Family request agency HH/DME Home Health Agency: [facility]-PT, OT, RN      HH/DME Requested Agency: [facility] HAS ALL NEEDED EQUIPMENT FROM PAST ADMISSIONS  DAUGHTER WORKING ON CHANGING CARDIOLOGIST AND RECEIVED REFERRAL VIA DR. Gala Romney  Patient response to transportation need: Is the patient able to respond to transportation needs?: Yes In the past 12 months, has lack of transportation kept you from medical appointments or from getting medications?: No In the past 12 months, has lack of transportation kept you from meetings, work, or from getting things needed for daily living?: No    Comments (or additional information): DAUGHTER WAS HERE FOR EDUCATION ON Wednesday AND IT WENT WELL. CHILDREN HAVE LOOKED INTO PT'S LTC POLICY AND COMPLETED PAPERWORK FOR IT HERE. FMLA FORMS ALSO COMPLETED HERE FOR SON AND DAUGHTER. CHILDREN TO POSSIBLY HIRE PRIVATE DUTY FOR PT TO HAVE SOMEONE WITH HER WHILE SON WORKS.   Patient/Family verbalized understanding of follow-up arrangements:  Yes  Individual  responsible for coordination of the follow-up plan: Jane Navarro [phone]  Confirmed correct DME delivered: Jane Navarro 01/27/2021    Jane Navarro, Jane Navarro

## 2021-01-27 NOTE — Progress Notes (Signed)
Patient ID: Jane Navarro, female   DOB: 02-05-1946, 75 y.o.   MRN: [id number]  Daughter here for education and it went well. Gave her the LTC MD completed statement and handicapped application placard for the car. Both very pleased with how well pt has done here and feel ready for discharge tomorrow. Daughter is driving the uhaul to brother's home this afternoon and will be back tomorrow for Mom's discharge with her brother.

## 2021-01-27 NOTE — Progress Notes (Signed)
Occupational Therapy Session Note  Patient Details  Name: Jane Navarro MRN: [id number] Date of Birth: [birth year]  Today's Date: 01/27/2021 OT Individual Time: 0900-1000 OT Individual Time Calculation (min): 60 min    Short Term Goals: Week 2:  OT Short Term Goal 1 (Week 2): Pt will be able to tolerate standing for 3 minutes to be able to brush teeth at sink. OT Short Term Goal 2 (Week 2): Pt will complete 3/3 steps of toileting with supervision. OT Short Term Goal 3 (Week 2): Pt will be able to don underwear and socks over feet with S.  Skilled Therapeutic Interventions/Progress Updates:    Pt seen this session for family education and ADL training with a focus on education with daughter and activity tolerance. Pt very energetic today and ready to show her daughter what she is able to do.  Pt sat to EOB independently and stood up to RW. With supervision, she ambulated to toilet to toilet and completed 3/3 tasks with S, then stepped to shower bench and showered with S.  Dried off and sat on BSC over toilet to dress. Set up with donning TED hose.  She then walked to sink to stand and brush teeth. Her standing tolerance has improved a great deal and she can now stand for several minutes at a time.  Ambulated to recliner to rest briefly.  Practiced her HEP of 4 exercises: -sh AROM with arm circles -dowel bar sh presses -resisted band arm extensions -sit to stands with hands on knees  Reviewed all exercises with her daughter. Discussed recommendations of 24/7 S and how to gradually taper down the amount of S as she improves over the weeks.  Also made suggestions on how to have the [facility] aides involved, to not "over help" as the pt has really reached a fairly high level of function - she just needs S in case should should feel dizzy.   No symptoms of dizziness, pt did not need binder today. Pt resting in recliner with daughter in the room.   Therapy Documentation Precautions:   Precautions Precautions: Fall Required Braces or Orthoses: Other Brace Other Brace: pt wears neoprene type sleeve on B knees due to arthritic pain Restrictions Weight Bearing Restrictions: No Other Position/Activity Restrictions: WBAT BLE     Pain: Pain Assessment Pain Scale: 0-10 Pain Score: 0-No pain ADL: ADL Eating: Independent Grooming: Supervision/safety Where Assessed-Grooming: Standing at sink Upper Body Bathing: Supervision/safety Where Assessed-Upper Body Bathing: Shower Lower Body Bathing: Supervision/safety Where Assessed-Lower Body Bathing: Shower Upper Body Dressing: Supervision/safety Where Assessed-Upper Body Dressing: Chair Lower Body Dressing: Supervision/safety, Setup Where Assessed-Lower Body Dressing: Chair Toileting: Supervision/safety Where Assessed-Toileting: Teacher, adult education: Close supervision Statistician Method: Proofreader: Acupuncturist: Close supervision Film/video editor Method: Designer, industrial/product: Emergency planning/management officer, Grab bars  Therapy/Group: Individual Therapy  Jane Navarro 01/27/2021, 10:52 AM

## 2021-01-27 NOTE — Progress Notes (Signed)
Occupational Therapy Discharge Summary  Patient Details  Name: Jane Navarro MRN: [id number] Date of Birth: 1946/02/14   Patient has met 9 of 9 long term goals due to improved activity tolerance, improved balance, and ability to compensate for deficits.  Patient to discharge at overall Supervision level.  Patient's care partner is independent to provide the necessary physical assistance at discharge.    HEP of 4 exercises: -sh AROM with arm circles -dowel bar sh presses -resisted band arm extensions -sit to stands with hands on knees  Family education completed with both her son and daughter.  Daughter observed toileting, shower, dressing and oral care at the sink, and the HEP.   Reasons goals not met: n/a  Recommendation:  Patient will benefit from ongoing skilled OT services in home health setting to continue to advance functional skills in the area of BADL and iADL.  Equipment: No equipment provided  Reasons for discharge: treatment goals met  Patient/family agrees with progress made and goals achieved: Yes  OT Discharge Precautions/Restrictions  Precautions Precautions: Fall   ADL ADL Eating: Independent Grooming: Supervision/safety Where Assessed-Grooming: Standing at sink Upper Body Bathing: Supervision/safety Where Assessed-Upper Body Bathing: Shower Lower Body Bathing: Supervision/safety Where Assessed-Lower Body Bathing: Shower Upper Body Dressing: Supervision/safety Where Assessed-Upper Body Dressing: Chair Lower Body Dressing: Supervision/safety, Setup Where Assessed-Lower Body Dressing: Chair Toileting: Supervision/safety Where Assessed-Toileting: Glass blower/designer: Close supervision Toilet Transfer Method: Counselling psychologist: Energy manager: Close supervision Social research officer, government Method: Heritage manager: Radio broadcast assistant, Grab bars Vision Baseline Vision/History: 1 Wears  glasses Patient Visual Report: No change from baseline Vision Assessment?: No apparent visual deficits Perception  Perception: Within Functional Limits Praxis Praxis: Intact Cognition Overall Cognitive Status: Within Functional Limits for tasks assessed Arousal/Alertness: Awake/alert Orientation Level: Oriented X4 Year: 2022 Month: October Day of Week: Correct Memory: Appears intact Immediate Memory Recall: Sock;Blue;Bed Memory Recall Sock: Without Cue Memory Recall Blue: Without Cue Memory Recall Bed: Without Cue Awareness: Appears intact Problem Solving: Appears intact Safety/Judgment: Appears intact Sensation Sensation Light Touch: Appears Intact Hot/Cold: Appears Intact Proprioception: Appears Intact Stereognosis: Appears Intact Coordination Gross Motor Movements are Fluid and Coordinated: No Fine Motor Movements are Fluid and Coordinated: Yes Coordination and Movement Description: general weakness from deconditioning Finger Nose Finger Test: [facility] Motor  Motor Motor: Within Functional Limits Motor - Discharge Observations: Global deconditioning Mobility  Transfers Sit to Stand: Independent with assistive device  Trunk/Postural Assessment  Postural Control Postural Control: Within Functional Limits  Balance Static Sitting Balance Static Sitting - Level of Assistance: 7: Independent Dynamic Sitting Balance Dynamic Sitting - Level of Assistance: 6: Modified independent (Device/Increase time) Static Standing Balance Static Standing - Level of Assistance: 6: Modified independent (Device/Increase time) Dynamic Standing Balance Dynamic Standing - Level of Assistance: 6: Modified independent (Device/Increase time) Extremity/Trunk Assessment RUE Assessment RUE Assessment: Within Functional Limits Passive Range of Motion (PROM) Comments: sh flex to 140 General Strength Comments: 4/5 LUE Assessment LUE Assessment: Within Functional Limits Passive Range of Motion  (PROM) Comments: sh flex to 140 General Strength Comments: 4/5   [place] 01/27/2021, 12:52 PM

## 2021-01-27 NOTE — Progress Notes (Addendum)
Advanced Heart Failure Rounding Note  PCP-Cardiologist: Parke Poisson, MD  HF: Dr. Gala Romney  Subjective:    Feels well. Working with OT at time of today's visit. Progressing favorably.   No further dizziness. BP improved.  Denies exertional chest pain or dyspnea.  Objective:   Weight Range: 58 kg Body mass index is 24.97 kg/m.   Vital Signs:   Temp:  [97.8 F (36.6 C)-98.4 F (36.9 C)] 97.8 F (36.6 C) (10/26 0527) Pulse Rate:  [60-77] 77 (10/26 0527) Resp:  [14-18] 18 (10/26 0527) BP: (140-150)/(75-81) 140/75 (10/26 0527) SpO2:  [94 %-99 %] 96 % (10/26 0527) Last BM Date: 01/27/21  Weight change: Filed Weights   01/24/21 0346 01/25/21 0441 01/26/21 0500  Weight: 58.5 kg 58.1 kg 58 kg    Intake/Output:   Intake/Output Summary (Last 24 hours) at 01/27/2021 0919 Last data filed at 01/27/2021 0700 Gross per 24 hour  Intake 550 ml  Output --  Net 550 ml      Physical Exam    General:  Well appearing elderly female. In no distress. HEENT: poor dentition otherwise normal Neck: supple. no JVD.  Cor: PMI nondisplaced. Regular rate & rhythm. No rubs, gallops or murmurs. Lungs: no respiratory difficulty Abdomen: nontender, nondistended.  Extremities: no cyanosis, clubbing, rash, edema Neuro: alert & oriented X 3. Affect pleasant   Labs    CBC Recent Labs    01/25/21 1240  WBC 5.2  NEUTROABS 2.8  HGB 10.0*  HCT 32.5*  MCV 99.4  PLT 253    Basic Metabolic Panel Recent Labs    85/63/14 1240  NA 137  K 3.8  CL 102  CO2 28  GLUCOSE 85  BUN 13  CREATININE 0.71  CALCIUM 8.6*    Liver Function Tests No results for input(s): AST, ALT, ALKPHOS, BILITOT, PROT, ALBUMIN in the last 72 hours.  No results for input(s): LIPASE, AMYLASE in the last 72 hours. Cardiac Enzymes No results for input(s): CKTOTAL, CKMB, CKMBINDEX, TROPONINI in the last 72 hours.  BNP: BNP (last 3 results) Recent Labs    12/29/20 0014  BNP >4,500.0*     ProBNP (last 3 results) No results for input(s): PROBNP in the last 8760 hours.   D-Dimer No results for input(s): DDIMER in the last 72 hours. Hemoglobin A1C No results for input(s): HGBA1C in the last 72 hours. Fasting Lipid Panel No results for input(s): CHOL, HDL, LDLCALC, TRIG, CHOLHDL, LDLDIRECT in the last 72 hours. Thyroid Function Tests No results for input(s): TSH, T4TOTAL, T3FREE, THYROIDAB in the last 72 hours.  Invalid input(s): FREET3   Other results:   Imaging    No results found.   Medications:     Scheduled Medications:  aspirin EC  81 mg Oral Daily   atorvastatin  80 mg Oral Daily   digoxin  0.0625 mg Oral Daily   docusate sodium  200 mg Oral Daily   enoxaparin (LOVENOX) injection  40 mg Subcutaneous Q24H   ezetimibe  10 mg Oral Daily   feeding supplement  237 mL Oral BID BM   levothyroxine  100 mcg Oral Q0600   midodrine  5 mg Oral TID WC   senna  2 tablet Oral BID   thiamine  100 mg Oral Daily   ticagrelor  90 mg Oral BID    Infusions:   PRN Medications: acetaminophen, alum & mag hydroxide-simeth, bisacodyl, diphenhydrAMINE, guaiFENesin-dextromethorphan, lip balm, magnesium hydroxide, melatonin, oxyCODONE-acetaminophen, polyethylene glycol, prochlorperazine **OR** prochlorperazine **OR** prochlorperazine,  sodium chloride, sodium phosphate    Patient Profile  Admitted 9/27 with NSTEMI complicated by cardiogenic shock.   Assessment/Plan  1. 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 ECHO EF 25-30% and severely reduced RV function.  - LHC with severe L main with multivessel  proximal LAD and mild RCA .  - In the cath lab PA sat < 40%., CI 1.6.  Placed Impella 2.5 placed and started on milrinone  - Felt too high risk for CABG.  - 9/30 PCI to RCA with DES x 3, deferred intervention on left main with difficulty wiring the LAD and non-flow limiting dissection.  Can consider repeat PCI attempt (versus off-pump LIMA)  down the road as she improves - Continue ASA 81, ticagrelor, atorvastatin. - No chest pain.    2.  Acute systolic HF ->  Cardiogenic shock:  - Ischemic cardiomyopathy.  Echo with EF 25-30%, severely decreased RV function, moderate-severe TR, moderate MR. Required MCS w/ Impella. Impella removed 10/3.  - Loop diuretics held due to orthostasis and low volume. Volume status ok. Weight continues to trend down.  - Continue digoxin 0.125 mcg , dig level 0.7 on 10/12. Repeat tomorrow prior to discharge. - Avoiding SGLT2i due to groin infections.  - No beta blocker d/t recent shock - Off losartan and spiro d/t orthostatic dizziness.  BP much improved with midodrine 5 mg TID. No further orthostatic hypotension. Can consider attempting to add back low-dose spiro 12.5 mg daily. - Continue ted hose when oob.    3. Shock Liver  -Resolved     4. Anemia  - Hgb dropped to 6.5 initially>> given 2UPRBCS with appropriate rise to 8.9.  - 1U RBCs given 9/29.   - Significant hematoma left groin post-intervention, had 2 units PRBCs 9/30 with appropriate increase in hgb. IABP removed 10/3. Site remains stable  - stable at Hgb 10 on recent labs   5. Thrombocytopenia due to Impella - resolved   6. H/O AKI:  - resolved - Renal function normalized.   7. Orthostatic hypotension: - In setting of poor PO intake, now improving  - BP improved. Denies further symptoms.  - Continue midodrine 5 mg tid .  - Holding spiro and losartan. See above  - Continue ted hose when oob.   8. Debility -making progress, therapy extended to 10/27  -CIR appreciated.    Planning to move in with son in [place] after discharge. Daughter will also stay with her for a few weeks.   Requesting referral to Cardiologist in [place] area.    Length of Stay: 16  FINCH, LINDSAY N, PA-C  01/27/2021, 9:19 AM  Advanced Heart Failure Team Pager [phone] (M-F; 7a - 5p)  Please contact [facility] for night-coverage after  hours (5p -7a ) and weekends on [email or web address]  Patient seen and examined with the above-signed Advanced Practice Provider and/or Housestaff. I personally reviewed laboratory data, imaging studies and relevant notes. I independently examined the patient and formulated the important aspects of the plan. I have edited the note to reflect any of my changes or salient points. I have personally discussed the plan with the patient and/or family.  Doing well. BPs stable. No HF symptoms.   General:  Well appearing. No resp difficulty HEENT: normal Neck: supple. no JVD. Carotids 2+ bilat; no bruits. No lymphadenopathy or thryomegaly appreciated. Cor: PMI nondisplaced. Regular rate & rhythm. No rubs, gallops or murmurs. Lungs: clear Abdomen: soft, nontender, nondistended. No hepatosplenomegaly. No bruits or masses. Good  bowel sounds. Extremities: no cyanosis, clubbing, rash, edema Neuro: alert & orientedx3, cranial nerves grossly intact. moves all 4 extremities w/o difficulty. Affect pleasant  Ok for d/c from our standpoint on current meds. Will refer to Dr. Latanya Maudlin team in [place].   Arvilla Meres, MD  9:59 AM

## 2021-01-27 NOTE — Progress Notes (Signed)
Physical Therapy Session Note  Patient Details  Name: Jane Navarro MRN: [id number] Date of Birth: 1945/07/14  Today's Date: 01/27/2021 PT Individual Time: 1415-1435 PT Individual Time Calculation (min): 20 min   Short Term Goals: Week 2:  PT Short Term Goal 1 (Week 2): STG = LTG due to LOS PT Short Term Goal 1 - Progress (Week 2): Met  Skilled Therapeutic Interventions/Progress Updates:     Patient in recliner in the room upon PT arrival. Patient alert and agreeable to PT session. Patient denied pain during session.  Patient declined offer to work on balance assessment for fall risk, stated, "I did all the things I needed to do earlier and did a great job!" Patient reports feeling confident about her performance with mobility today and feels prepared for d/c. Patient requested to go to the bathroom performed ambulatory toilet transfer and hand hygiene with mod I using RW. Had patient pick up a wash cloth off the floor holding her RW with supervision while ambulating back to the recliner.   Patient in recliner in the room at end of session with breaks locked and all needs within reach.   Therapy Documentation Precautions:  Precautions Precautions: Fall Required Braces or Orthoses: Other Brace Other Brace: pt wears neoprene type sleeve on B knees due to arthritic pain Restrictions Weight Bearing Restrictions: No RLE Weight Bearing: Non weight bearing LLE Weight Bearing: Non weight bearing Other Position/Activity Restrictions: WBAT BLE    Therapy/Group: Individual Therapy  Christion Leonhard L [name] PT, DPT  01/27/2021, 2:40 PM

## 2021-01-27 NOTE — Progress Notes (Signed)
Patient ID: Jane Navarro, female   DOB: 1946-01-29, 75 y.o.   MRN: [id number] Follow up with the patient regarding pending discharge. Reported she feels ready to go and today is grad day. Daughter scheduled to come in and go through therapy with the patient 0900-1100 to practice stairs and ramped entry. Patient also aware of need for dressing to right groin wound. Nursing to address care with the patient and daughter for discharge. Patient notes no other concerns and feels prepared for discharge tomorrow. Pamelia Hoit

## 2021-01-27 NOTE — Progress Notes (Signed)
PROGRESS NOTE   Subjective/Complaints: Patient seen sitting up in her chair this morning.  She states she slept well overnight.  She is very much looking forward to discharge tomorrow.  She was seen by heart failure team this morning, notes reviewed-no changes.  ROS: Denies CP, SOB, N/V/D  Objective:   No results found. Recent Labs    01/25/21 1240  WBC 5.2  HGB 10.0*  HCT 32.5*  PLT 253     Recent Labs    01/25/21 1240  NA 137  K 3.8  CL 102  CO2 28  GLUCOSE 85  BUN 13  CREATININE 0.71  CALCIUM 8.6*     Intake/Output Summary (Last 24 hours) at 01/27/2021 1330 Last data filed at 01/27/2021 1300 Gross per 24 hour  Intake 510 ml  Output --  Net 510 ml         Physical Exam: Vital Signs Blood pressure 140/75, pulse 77, temperature 97.8 F (36.6 C), resp. rate 18, height 5' (1.524 m), weight 58 kg, SpO2 96 %. Constitutional: No distress . Vital signs reviewed. HENT: Normocephalic.  Atraumatic. Eyes: EOMI. No discharge. Cardiovascular: No JVD.  RRR. Respiratory: Normal effort.  No stridor.  Bilateral clear to auscultation. GI: Non-distended.  BS +. Skin: Warm and dry.  Intact. Psych: Normal mood.  Normal behavior. Musc: No edema in extremities.  No tenderness in extremities. Neuro: Alert Motor: 4/5 throughout, unchanged  Assessment/Plan: 1. Functional deficits which require 3+ hours per day of interdisciplinary therapy in a comprehensive inpatient rehab setting. Physiatrist is providing close team supervision and 24 hour management of active medical problems listed below. Physiatrist and rehab team continue to assess barriers to discharge/monitor patient progress toward functional and medical goals  Care Tool:  Bathing    Body parts bathed by patient: Right arm, Left arm, Chest, Abdomen, Right upper leg, Face, Front perineal area, Left upper leg, Right lower leg, Left lower leg, Buttocks   Body  parts bathed by helper: Buttocks     Bathing assist Assist Level: Supervision/Verbal cueing     Upper Body Dressing/Undressing Upper body dressing   What is the patient wearing?: Dress Orthosis activity level: Performed by helper  Upper body assist Assist Level: Set up assist    Lower Body Dressing/Undressing Lower body dressing      What is the patient wearing?: Pants     Lower body assist Assist for lower body dressing: Supervision/Verbal cueing     Toileting Toileting    Toileting assist Assist for toileting: Supervision/Verbal cueing     Transfers Chair/bed transfer  Transfers assist     Chair/bed transfer assist level: Supervision/Verbal cueing Chair/bed transfer assistive device: Geologist, engineering   Ambulation assist   Ambulation activity did not occur: Safety/medical concerns (Fatigue, syncope)  Assist level: Independent with assistive device Assistive device: Walker-rolling Max distance: 250'   Walk 10 feet activity   Assist  Walk 10 feet activity did not occur: Safety/medical concerns (Fatigue, syncope)  Assist level: Independent with assistive device Assistive device: Walker-rolling   Walk 50 feet activity   Assist Walk 50 feet with 2 turns activity did not occur: Safety/medical concerns (Fatigue, syncope)  Assist level: Independent with assistive device Assistive device: Walker-rolling    Walk 150 feet activity   Assist Walk 150 feet activity did not occur: Safety/medical concerns (Fatigue, syncope)  Assist level: Independent with assistive device Assistive device: Walker-rolling    Walk 10 feet on uneven surface  activity   Assist Walk 10 feet on uneven surfaces activity did not occur: Safety/medical concerns (Fatigue, syncope)   Assist level: Supervision/Verbal cueing Assistive device: Walker-rolling   Wheelchair     Assist Is the patient using a wheelchair?: Yes Type of Wheelchair:  Manual Wheelchair activity did not occur: Safety/medical concerns (Fatigue, syncope)  Wheelchair assist level: Dependent - Patient 0%      Wheelchair 50 feet with 2 turns activity    Assist    Wheelchair 50 feet with 2 turns activity did not occur: Safety/medical concerns (Fatigue, syncope)   Assist Level: Dependent - Patient 0%   Wheelchair 150 feet activity     Assist  Wheelchair 150 feet activity did not occur: Safety/medical concerns (Fatigue, syncope)   Assist Level: Dependent - Patient 0%   Blood pressure 140/75, pulse 77, temperature 97.8 F (36.6 C), resp. rate 18, height 5' (1.524 m), weight 58 kg, SpO2 96 %.  Medical Problem List and Plan: 1.  Cardiopulmonary debility s/p NSTEMI, CHF Continue CIR, patient and family education 2.  Impaired mobility: Continue Lovenox             -antiplatelet therapy: Continue ASA/Brillinta 3. Postoperative pain: D/c percocet. Provided with pain relief journal  Monitor increased exertion 4. Mood: LCSW to follow for evaluation and support.              -antipsychotic agents: N/a 5. Neuropsych: This patient is capable of making decisions on her own behalf. 6. Skin/Wound Care: Routine pressure relief measures.   10/25- d/c Santyl- doesn't need anymore- maybe a 4x4 for moisture 7. Fluids/Electrolytes/Nutrition: Monitor I/Os 8. NSTEMI/CAD s/p PCI/stent: Monitor for symptoms with increase in activity.        9. Acute systolic HF: Heart healthy diet. Check daily weights--admit wt 141 lbs. Provided dietary counseling --Monitor for symptoms of overload.  --Holding spironolactone and Cozaar given hypotension, Continue Digoxin, ASA/Brillinta and Lipitor  Filed Weights   01/24/21 0346 01/25/21 0441 01/26/21 0500  Weight: 58.5 kg 58.1 kg 58 kg   Stable on 10/26 10. Hypothyroid: On supplement for past month.  TSH ordered per cards             --Improving from 41.4-->4.8 in three weeks.  11. AKI: Resolved 12. Shocked liver:   ALT  elevated, but improving on 10/11  13. Acute blood loss anemia: Groin hematomas appear resolved.  Hemoglobin 10.0 on 10/24 14. Low albumin levels: Continue Ensure BID--family encouraged to bring low salt foods from home. 15. Bilateral Knee pain: Voltaren gel added to help manage symptoms.   Controlled with voltaren on 10/19 16. Constipation: Increased senna to bid (does not like daily miralax-->d/c and continue MOM  17. Orthostasis w/syncope:              --change to THT and order abdominal binder.              --encourage oral intake.   Orthostatics negative on 10/13, however patient with symptoms consistent with orthostasis  Increased midodrine 5mg  TID  Improving 18. Hyponatremia: Resolved  Na 137 on 10/21  Continue to monitor 19. Hypokalemia  K+ 3.8 on 10/24 after supplementation  LOS: 16 days A FACE TO FACE  EVALUATION WAS PERFORMED  Jane Navarro Karis Juba 01/27/2021, 1:30 PM

## 2021-01-28 LAB — DIGOXIN LEVEL: Digoxin Level: 0.5 ng/mL — ABNORMAL LOW (ref 0.8–2.0)

## 2021-01-28 LAB — BASIC METABOLIC PANEL
Anion gap: 7 (ref 5–15)
BUN: 15 mg/dL (ref 8–23)
CO2: 24 mmol/L (ref 22–32)
Calcium: 8.6 mg/dL — ABNORMAL LOW (ref 8.9–10.3)
Chloride: 105 mmol/L (ref 98–111)
Creatinine, Ser: 0.84 mg/dL (ref 0.44–1.00)
GFR, Estimated: >60 mL/min (ref >60)
Glucose, Bld: 81 mg/dL (ref 70–99)
Potassium: 3.6 mmol/L (ref 3.5–5.1)
Sodium: 136 mmol/L (ref 135–145)

## 2021-01-28 NOTE — Progress Notes (Signed)
Inpatient Rehabilitation Discharge Medication Review by a Pharmacist  A complete drug regimen review was completed for this patient to identify any potential clinically significant medication issues.  High Risk Drug Classes Is patient taking? Indication by Medication  Antipsychotic No   Anticoagulant No   Antibiotic No   Opioid Yes Percocet- post-op pain  Antiplatelet Yes Aspirin/Ticagrelor- stent  Hypoglycemics/insulin No   Vasoactive Medication Yes Midodrine/digoxin-bp/HF  Chemotherapy No   Other No      Type of Medication Issue Identified Description of Issue Recommendation(s)  Drug Interaction(s) (clinically significant)     Duplicate Therapy     Allergy     No Medication Administration End Date     Incorrect Dose     Additional Drug Therapy Needed     Significant med changes from prior encounter (inform family/care partners about these prior to discharge). New drug therapy: Ticagrelor, zetia, digoxin, midodrine, percocet, miralax Stop for discharge: amlodipine, clopidogrel, zyrtec, thiamine, B-12 On AVS/Counsel patient on changes at bedside  Instruct patient to stop clopidogrel, amlodipine  Other       Clinically significant medication issues were identified that warrant physician communication and completion of prescribed/recommended actions by midnight of the next day:  No  Name of provider notified for urgent issues identified:   Provider Method of Notification:     Pharmacist comments:   Time spent performing this drug regimen review (minutes):  30   Jensen Kilburg J Adriel Kessen BS, PharmD, BCPS 01/28/2021 8:39 AM

## 2021-01-28 NOTE — Progress Notes (Signed)
INPATIENT REHABILITATION DISCHARGE NOTE   Discharge instructions by: Deatra Ina  Verbalized understanding: By patient and son  Skin care/Wound care healing? Small, healing wound to right groin.   Pain: no pain  IV's: none  Tubes/Drains: none  O2: room air  Safety instructions: fall risk   Patient belongings: packed and sent with the patient   Discharged to: home with son  Discharged via: private car   Notes:

## 2021-01-28 NOTE — Progress Notes (Signed)
   Patient ready for d/c from our standpoint.  I have provided her contact info to Dr. Adrian Prows in the [facility] Clinic in [place] ([facility]) who will arrange HF follow-up.   The AHF team will sign off.   Arvilla Meres, MD  9:57 AM

## 2021-01-28 NOTE — Progress Notes (Signed)
PROGRESS NOTE   Subjective/Complaints:  Pt reports doing great- ready for d/c- no concerns- no LE edema-    ROS:  Pt denies SOB, abd pain, CP, N/V/C/D, and vision changes   Objective:   No results found. Recent Labs    01/25/21 1240  WBC 5.2  HGB 10.0*  HCT 32.5*  PLT 253    Recent Labs    01/25/21 1240 01/28/21 0533  NA 137 136  K 3.8 3.6  CL 102 105  CO2 28 24  GLUCOSE 85 81  BUN 13 15  CREATININE 0.71 0.84  CALCIUM 8.6* 8.6*    Intake/Output Summary (Last 24 hours) at 01/28/2021 0957 Last data filed at 01/28/2021 0700 Gross per 24 hour  Intake 510 ml  Output 0 ml  Net 510 ml        Physical Exam: Vital Signs Blood pressure (!) 143/83, pulse 80, temperature 98.1 F (36.7 C), temperature source Oral, resp. rate 16, height 5' (1.524 m), weight 54.3 kg, SpO2 98 %.    General: awake, alert, appropriate, sitting up in bed;  NAD HENT: conjugate gaze; oropharynx moist; missing some teeth. CV: regular rate; no JVD Pulmonary: CTA B/L; no W/R/R- good air movement GI: soft, NT, ND, (+)BS Psychiatric: appropriate Neurological: Ox3 No LE edema- looks great Skin: Warm and dry.  Intact. Psych: Normal mood.  Normal behavior. Musc: No edema in extremities.  No tenderness in extremities. Neuro: Alert Motor: 4/5 throughout, unchanged  Assessment/Plan: 1. Functional deficits which require 3+ hours per day of interdisciplinary therapy in a comprehensive inpatient rehab setting. Physiatrist is providing close team supervision and 24 hour management of active medical problems listed below. Physiatrist and rehab team continue to assess barriers to discharge/monitor patient progress toward functional and medical goals  Care Tool:  Bathing    Body parts bathed by patient: Right arm, Left arm, Chest, Abdomen, Right upper leg, Face, Front perineal area, Left upper leg, Right lower leg, Left lower leg,  Buttocks   Body parts bathed by helper: Buttocks     Bathing assist Assist Level: Supervision/Verbal cueing     Upper Body Dressing/Undressing Upper body dressing   What is the patient wearing?: Dress Orthosis activity level: Performed by helper  Upper body assist Assist Level: Set up assist    Lower Body Dressing/Undressing Lower body dressing      What is the patient wearing?: Pants     Lower body assist Assist for lower body dressing: Supervision/Verbal cueing     Toileting Toileting    Toileting assist Assist for toileting: Supervision/Verbal cueing     Transfers Chair/bed transfer  Transfers assist     Chair/bed transfer assist level: Supervision/Verbal cueing Chair/bed transfer assistive device: Geologist, engineering   Ambulation assist   Ambulation activity did not occur: Safety/medical concerns (Fatigue, syncope)  Assist level: Independent with assistive device Assistive device: Walker-rolling Max distance: 250'   Walk 10 feet activity   Assist  Walk 10 feet activity did not occur: Safety/medical concerns (Fatigue, syncope)  Assist level: Independent with assistive device Assistive device: Walker-rolling   Walk 50 feet activity   Assist Walk 50 feet with 2 turns  activity did not occur: Safety/medical concerns (Fatigue, syncope)  Assist level: Independent with assistive device Assistive device: Walker-rolling    Walk 150 feet activity   Assist Walk 150 feet activity did not occur: Safety/medical concerns (Fatigue, syncope)  Assist level: Independent with assistive device Assistive device: Walker-rolling    Walk 10 feet on uneven surface  activity   Assist Walk 10 feet on uneven surfaces activity did not occur: Safety/medical concerns (Fatigue, syncope)   Assist level: Supervision/Verbal cueing Assistive device: Walker-rolling   Wheelchair     Assist Is the patient using a wheelchair?: Yes Type of Wheelchair:  Manual Wheelchair activity did not occur: Safety/medical concerns (Fatigue, syncope)  Wheelchair assist level: Dependent - Patient 0%      Wheelchair 50 feet with 2 turns activity    Assist    Wheelchair 50 feet with 2 turns activity did not occur: Safety/medical concerns (Fatigue, syncope)   Assist Level: Dependent - Patient 0%   Wheelchair 150 feet activity     Assist  Wheelchair 150 feet activity did not occur: Safety/medical concerns (Fatigue, syncope)   Assist Level: Dependent - Patient 0%   Blood pressure (!) 143/83, pulse 80, temperature 98.1 F (36.7 C), temperature source Oral, resp. rate 16, height 5' (1.524 m), weight 54.3 kg, SpO2 98 %.  Medical Problem List and Plan: 1.  Cardiopulmonary debility s/p NSTEMI, CHF Continue CIR, patient and family education  -d/c today- CHF/heart failure has set up f/u for pt in [place] 2.  Impaired mobility: Continue Lovenox             -antiplatelet therapy: Continue ASA/Brillinta 3. Postoperative pain: D/c percocet. Provided with pain relief journal  Monitor increased exertion 4. Mood: LCSW to follow for evaluation and support.              -antipsychotic agents: N/a 5. Neuropsych: This patient is capable of making decisions on her own behalf. 6. Skin/Wound Care: Routine pressure relief measures.   10/25- d/c Santyl- doesn't need anymore- maybe a 4x4 for moisture 7. Fluids/Electrolytes/Nutrition: Monitor I/Os 8. NSTEMI/CAD s/p PCI/stent: Monitor for symptoms with increase in activity.        9. Acute systolic HF: Heart healthy diet. Check daily weights--admit wt 141 lbs. Provided dietary counseling --Monitor for symptoms of overload.  --Holding spironolactone and Cozaar given hypotension, Continue Digoxin, ASA/Brillinta and Lipitor  Filed Weights   01/25/21 0441 01/26/21 0500 01/28/21 0401  Weight: 58.1 kg 58 kg 54.3 kg   Stable on 10/26 10. Hypothyroid: On supplement for past month.  TSH ordered per cards              --Improving from 41.4-->4.8 in three weeks.  11. AKI: Resolved 12. Shocked liver:   ALT elevated, but improving on 10/11  13. Acute blood loss anemia: Groin hematomas appear resolved.  Hemoglobin 10.0 on 10/24 14. Low albumin levels: Continue Ensure BID--family encouraged to bring low salt foods from home. 15. Bilateral Knee pain: Voltaren gel added to help manage symptoms.   Controlled with voltaren on 10/19 16. Constipation: Increased senna to bid (does not like daily miralax-->d/c and continue MOM  17. Orthostasis w/syncope:              --change to THT and order abdominal binder.              --encourage oral intake.   Orthostatics negative on 10/13, however patient with symptoms consistent with orthostasis  Increased midodrine 5mg  TID  Improving 18. Hyponatremia: Resolved  Na 137 on 10/21  Continue to monitor 19. Hypokalemia  K+ 3.8 on 10/24 after supplementation  LOS: 17 days A FACE TO FACE EVALUATION WAS PERFORMED  Leighla Chestnutt 01/28/2021, 9:57 AM

## 2021-01-28 NOTE — Clinical Note (Incomplete)
Patient excited regarding pending discharge tomorrow, denies pain or discomfort

## 2021-01-28 NOTE — Progress Notes (Signed)
Patient became symptomatic when her orthostatic VS were taken( standing,(dizziness,  weakness,and lightlessness). She was  places in bed and assessed, Patient states that this occurs most of the time when she is taking her Orthostatic VS , refer to VS data record. Denies pain, monitor, HOB elevated, po liquids provided and monitor.Call bell and bed alarm in place

## 2021-01-29 MED ORDER — DIGOXIN 125 MCG PO TABS: 0.0625 mg | ORAL_TABLET | Freq: Every day | ORAL | 1 refills | Status: DC

## 2021-01-29 MED ORDER — DIGOXIN 125 MCG PO TABS: 0.0625 mg | ORAL_TABLET | Freq: Every day | ORAL | 1 refills | Status: AC

## 2021-02-01 ENCOUNTER — Inpatient Hospital Stay: Payer: Medicare HMO | Admitting: Adult Health

## 2021-02-02 ENCOUNTER — Encounter: Payer: Medicare HMO | Admitting: Physical Medicine and Rehabilitation

## 2021-02-03 ENCOUNTER — Encounter (HOSPITAL_COMMUNITY): Payer: Medicare HMO

## 2021-02-08 ENCOUNTER — Telehealth: Payer: Self-pay | Admitting: Internal Medicine

## 2021-02-08 NOTE — Telephone Encounter (Signed)
Routed to MD and primary nurse to make aware ° °

## 2021-02-08 NOTE — Telephone Encounter (Signed)
Boneta Lucks, weekend RN with [facility], is calling to make Dr. Jacques Navy aware that patient is beginning home health and provide report. She states she expects 7-10 visits over the next 60 days. 3 with PRN. Home health aid 2x/week for 2 weeks. Patient will receive speech therapy with cognition and memory therapy. Boneta Lucks reports patient has some brain fog and issues with memory after STEMI. Boneta Lucks also mentioned possible occupational therapy for self care and social working. Per Boneta Lucks, patient's skin looks great--no break down. She states if there is anything else that Dr. Jacques Navy would like to have done (labs, mobile xray etc.) they can complete with verbal order or "45 paperwork" used by [facility].  Phone #:  Waldon Reining Line- [phone] [facility] (Kim [phone]

## 2021-02-10 ENCOUNTER — Telehealth (HOSPITAL_COMMUNITY): Payer: Self-pay

## 2021-02-10 NOTE — Telephone Encounter (Signed)
error 

## 2023-02-26 IMAGING — CT CT HEAD W/O CM
3 series · 15 of 47 positions shown, 18 images · non-contrast
Comparison: CT head 12/05/2020

CLINICAL DATA: Mental status change.  Unknown cause

EXAM:
CT HEAD WITHOUT CONTRAST
TECHNIQUE: Contiguous axial images were obtained from the base of the skull
through the vertex without intravenous contrast.

[Series 2: head wo · axial · 0.45mm/px · z∈[+1146,+1272]mm · 9 of 31 slices shown, 12 images]
[im 3/31  brain]
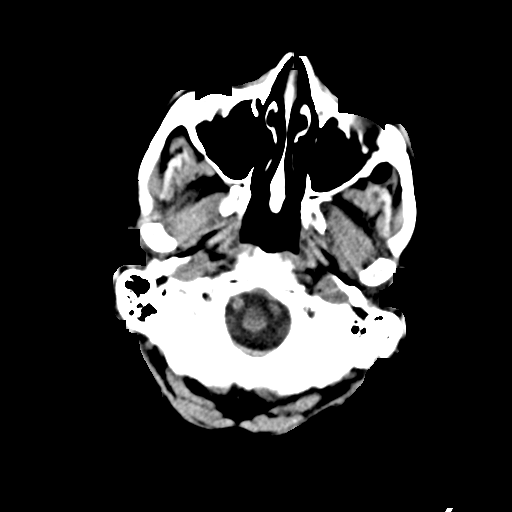
[im 3/31  bone]
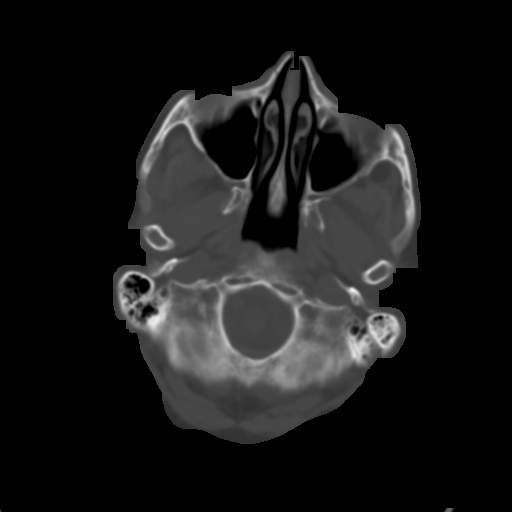
[im 6/31  brain]
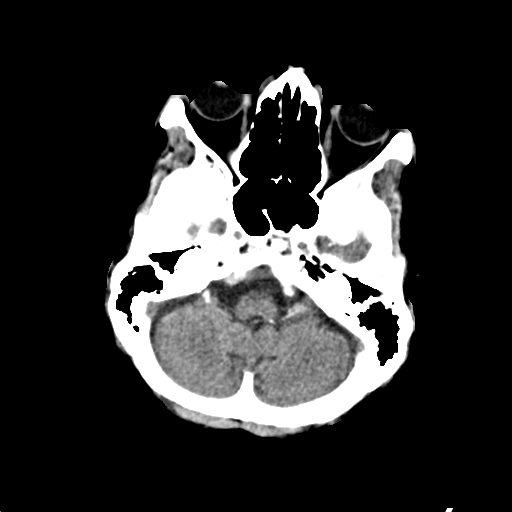
[im 9/31  brain]
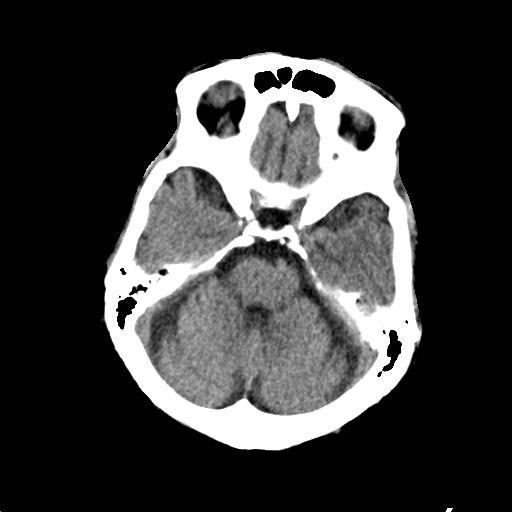
[im 12/31  brain]
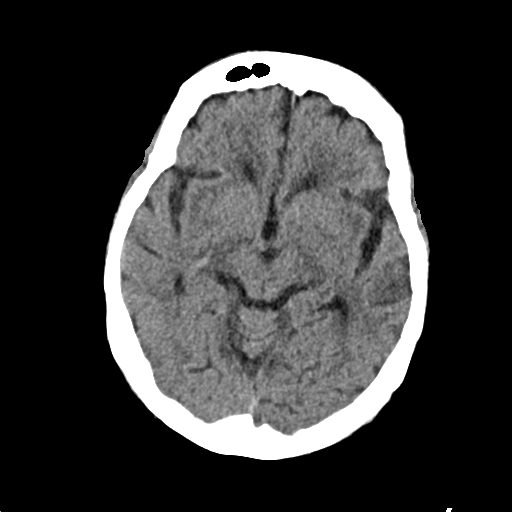
[im 16/31  brain]
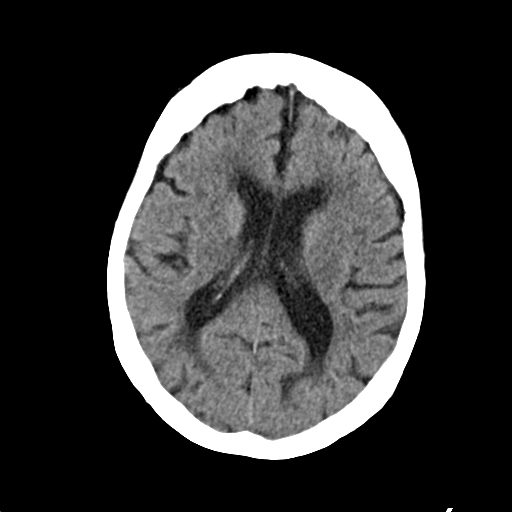
[im 16/31  bone]
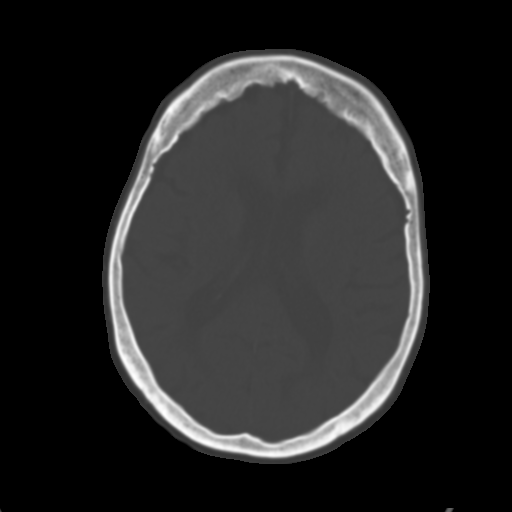
[im 19/31  brain]
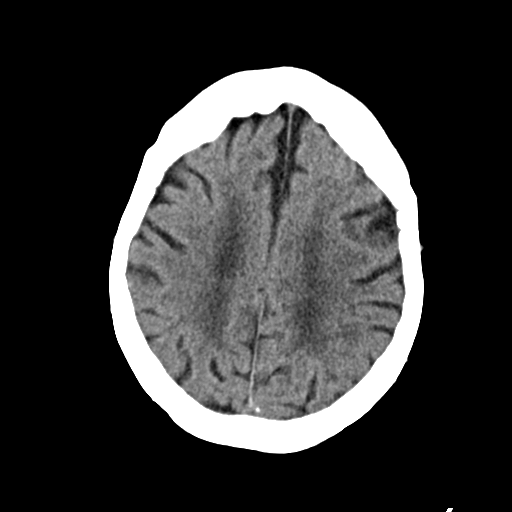
[im 22/31  brain]
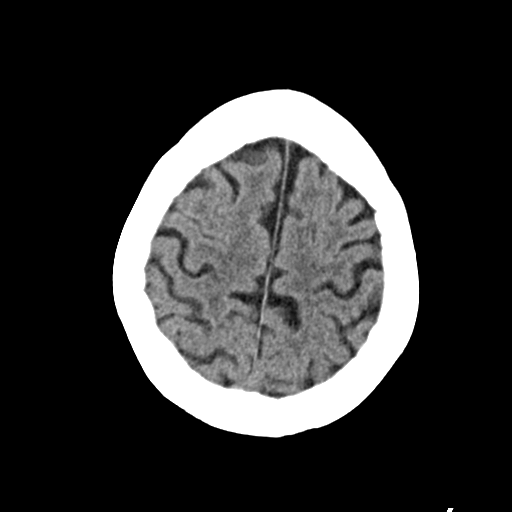
[im 25/31  brain]
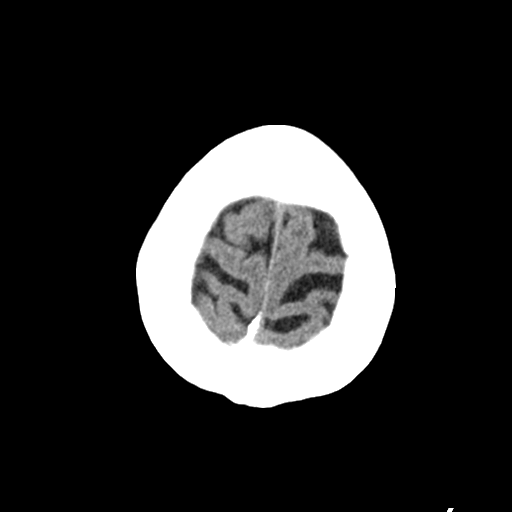
[im 28/31  brain]
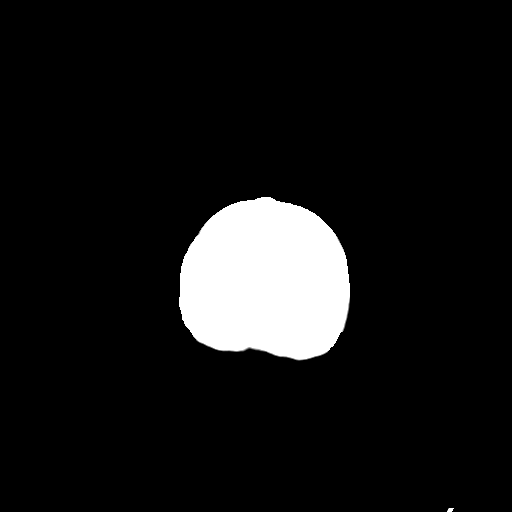
[im 28/31  bone]
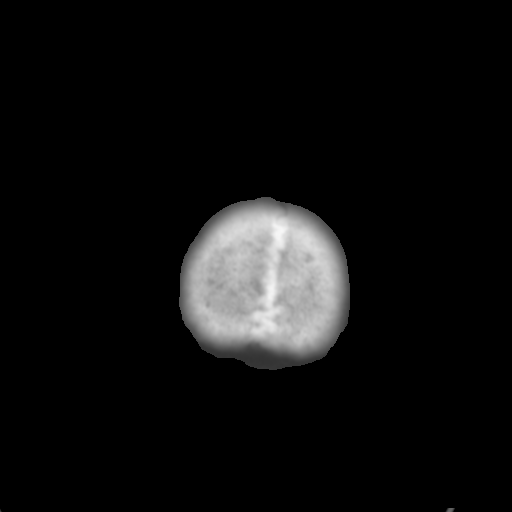

[Series 4: coronal soft · coronal · 0.30mm/px · 3 of 67 slices shown]
[im 23/67  brain]
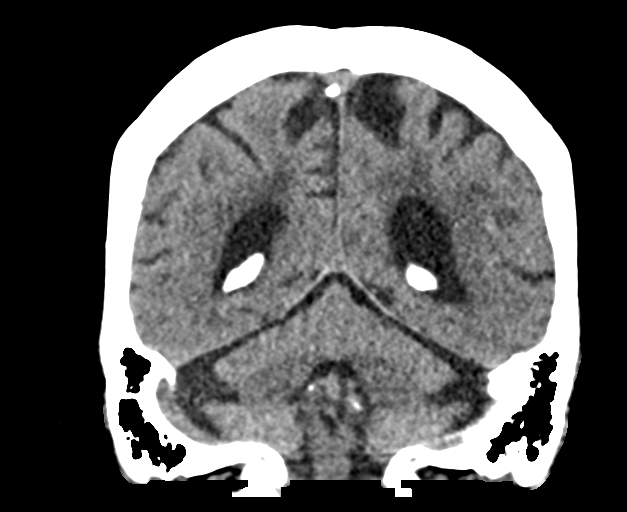
[im 30/67  brain]
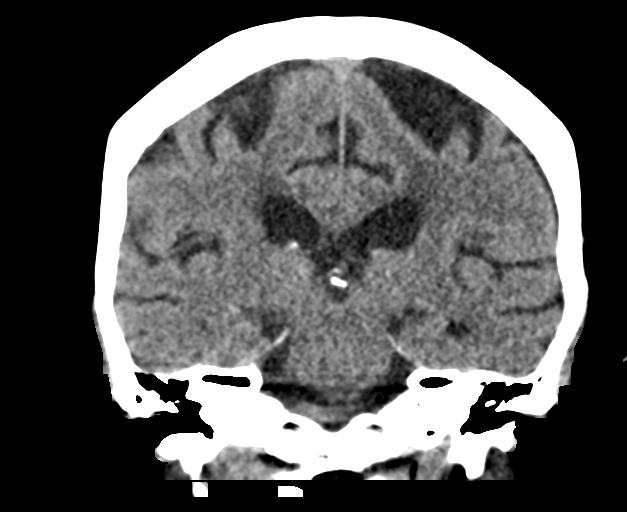
[im 37/67  brain]
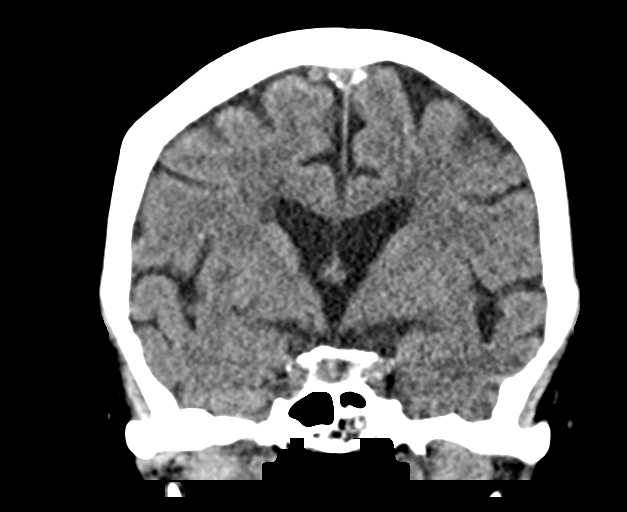

[Series 5: sag soft · sagittal · 0.30mm/px · 3 of 66 slices shown]
[im 22/66  brain]
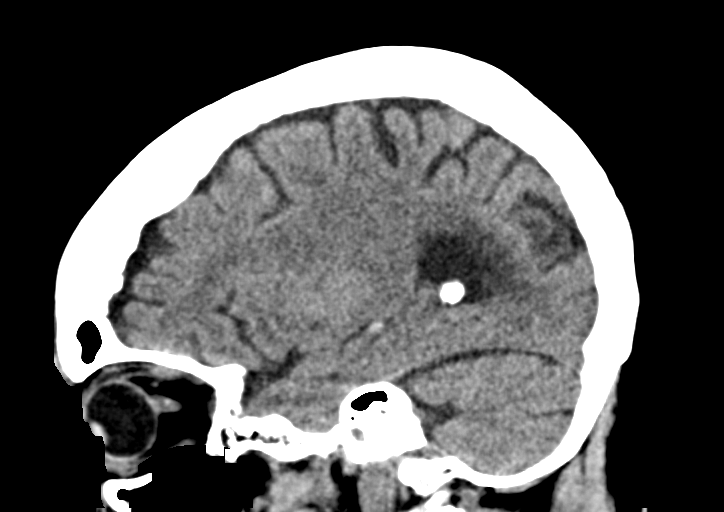
[im 33/66  brain]
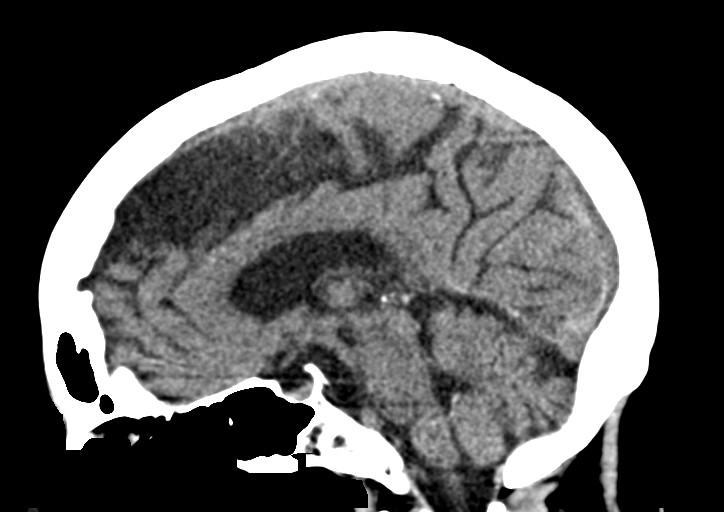
[im 44/66  brain]
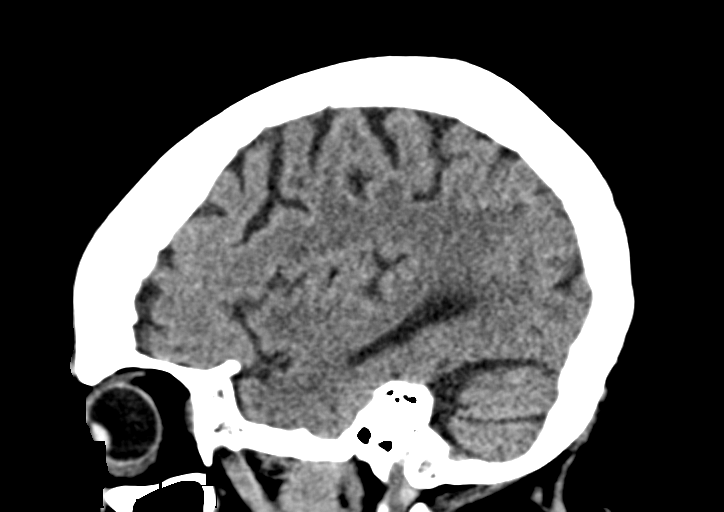

[15 of 47 positions shown; findings below may reference images not displayed]

BRAIN:
BRAIN
Cerebral ventricle sizes are concordant with the degree of cerebral
volume loss. Patchy and confluent areas of decreased attenuation are
noted throughout the deep and periventricular white matter of the
cerebral hemispheres bilaterally, compatible with chronic
microvascular ischemic disease.

No evidence of large-territorial acute infarction. No parenchymal
hemorrhage. No mass lesion. No extra-axial collection.

No mass effect or midline shift. No hydrocephalus. Basilar cisterns
are patent.

Vascular: No hyperdense vessel. Atherosclerotic calcifications are
present within the cavernous internal carotid arteries.

Skull: No acute fracture or focal lesion.

Sinuses/Orbits: Paranasal sinuses and mastoid air cells are clear.
The orbits are unremarkable.

Other: None.
IMPRESSION: No acute intracranial abnormality.

## 2023-02-26 IMAGING — DX DG CHEST 1V PORT
1 series · 1 of 1 positions shown · non-contrast
Comparison: None.

CLINICAL DATA: returned home [REDACTED] from rehab after adm. To
hospital DX CVA , pt is unable to walk without assistance and has
declined in care in 3 days that she has been home, decreased po
intake, unable to care for herself

EXAM:
PORTABLE CHEST 1 VIEW

[chest ap]
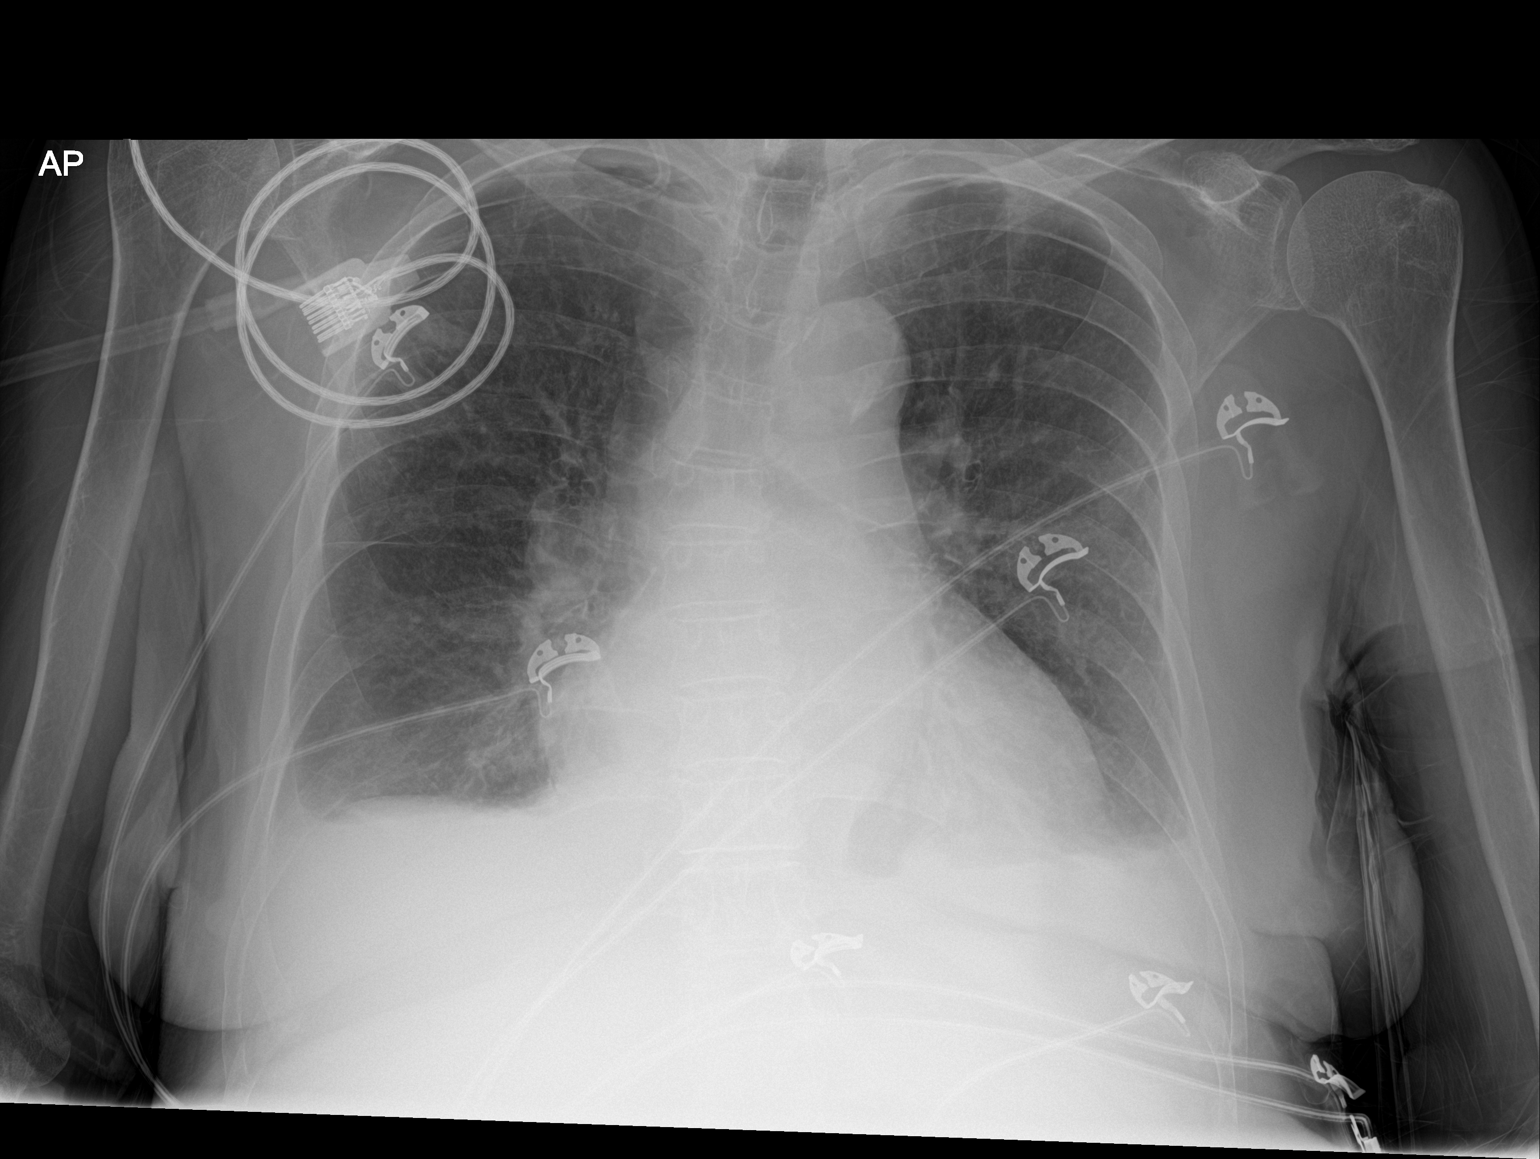

[1 of 1 positions shown; findings below may reference images not displayed]

FINDINGS: The heart and mediastinal contours are within normal limits. Aortic
calcification.

No focal consolidation. No pulmonary edema. Blunting of bilateral
costophrenic angles with likely trace pleural effusions. No
pneumothorax.

No acute osseous abnormality.
IMPRESSION: 1. Blunting of bilateral costophrenic angles with likely trace
pleural effusions.
2.  Aortic Atherosclerosis ([id number]).
